# Patient Record
Sex: Female | Born: 1950
Health system: Southern US, Community
[De-identification: ages and names within clinical notes are randomized; demographics above are authoritative.]

## PROBLEM LIST (undated history)

## (undated) DIAGNOSIS — G5600 Carpal tunnel syndrome, unspecified upper limb: Secondary | ICD-10-CM

## (undated) DIAGNOSIS — R1013 Epigastric pain: Secondary | ICD-10-CM

## (undated) DIAGNOSIS — E782 Mixed hyperlipidemia: Secondary | ICD-10-CM

## (undated) DIAGNOSIS — K219 Gastro-esophageal reflux disease without esophagitis: Secondary | ICD-10-CM

## (undated) DIAGNOSIS — E7800 Pure hypercholesterolemia, unspecified: Secondary | ICD-10-CM

## (undated) DIAGNOSIS — I10 Essential (primary) hypertension: Secondary | ICD-10-CM

## (undated) DIAGNOSIS — R000 Tachycardia, unspecified: Secondary | ICD-10-CM

## (undated) DIAGNOSIS — E669 Obesity, unspecified: Secondary | ICD-10-CM

## (undated) DIAGNOSIS — E119 Type 2 diabetes mellitus without complications: Secondary | ICD-10-CM

## (undated) DIAGNOSIS — R1115 Cyclical vomiting syndrome unrelated to migraine: Secondary | ICD-10-CM

## (undated) DIAGNOSIS — E049 Nontoxic goiter, unspecified: Secondary | ICD-10-CM

## (undated) DIAGNOSIS — M8580 Other specified disorders of bone density and structure, unspecified site: Secondary | ICD-10-CM

## (undated) DIAGNOSIS — L02239 Carbuncle of trunk, unspecified: Secondary | ICD-10-CM

## (undated) DIAGNOSIS — E1142 Type 2 diabetes mellitus with diabetic polyneuropathy: Secondary | ICD-10-CM

## (undated) DIAGNOSIS — Z9889 Other specified postprocedural states: Secondary | ICD-10-CM

## (undated) DIAGNOSIS — E1143 Type 2 diabetes mellitus with diabetic autonomic (poly)neuropathy: Secondary | ICD-10-CM

## (undated) DIAGNOSIS — R609 Edema, unspecified: Secondary | ICD-10-CM

## (undated) DIAGNOSIS — T4145XA Adverse effect of unspecified anesthetic, initial encounter: Secondary | ICD-10-CM

## (undated) DIAGNOSIS — T8859XA Other complications of anesthesia, initial encounter: Secondary | ICD-10-CM

## (undated) DIAGNOSIS — E1169 Type 2 diabetes mellitus with other specified complication: Secondary | ICD-10-CM

## (undated) DIAGNOSIS — Z780 Asymptomatic menopausal state: Secondary | ICD-10-CM

## (undated) DIAGNOSIS — R112 Nausea with vomiting, unspecified: Secondary | ICD-10-CM

## (undated) DIAGNOSIS — N189 Chronic kidney disease, unspecified: Secondary | ICD-10-CM

## (undated) DIAGNOSIS — G4733 Obstructive sleep apnea (adult) (pediatric): Principal | ICD-10-CM

## (undated) HISTORY — DX: Type 2 diabetes mellitus with diabetic autonomic (poly)neuropathy: E11.43

## (undated) HISTORY — DX: Carpal tunnel syndrome, unspecified upper limb: G56.00

## (undated) HISTORY — DX: Type 2 diabetes mellitus without complications: E11.9

## (undated) HISTORY — DX: Chronic kidney disease, unspecified: N18.9

## (undated) HISTORY — DX: Obesity, unspecified: E66.9

## (undated) HISTORY — DX: Obstructive sleep apnea (adult) (pediatric): G47.33

## (undated) HISTORY — DX: Cyclical vomiting syndrome unrelated to migraine: R11.15

## (undated) HISTORY — DX: Other specified disorders of bone density and structure, unspecified site: M85.80

## (undated) HISTORY — DX: Carbuncle of trunk, unspecified: L02.239

## (undated) HISTORY — DX: Nontoxic goiter, unspecified: E04.9

## (undated) HISTORY — DX: Essential (primary) hypertension: I10

## (undated) HISTORY — DX: Edema, unspecified: R60.9

## (undated) HISTORY — DX: Pure hypercholesterolemia, unspecified: E78.00

## (undated) HISTORY — DX: Asymptomatic menopausal state: Z78.0

## (undated) HISTORY — DX: Gastro-esophageal reflux disease without esophagitis: K21.9

## (undated) HISTORY — DX: Mixed hyperlipidemia: E78.2

## (undated) HISTORY — DX: Tachycardia, unspecified: R00.0

## (undated) HISTORY — DX: Type 2 diabetes mellitus with diabetic polyneuropathy: E11.42

## (undated) HISTORY — DX: Epigastric pain: R10.13

## (undated) HISTORY — DX: Type 2 diabetes mellitus with other specified complication: E11.69

---
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
## 1986-12-30 HISTORY — PX: DILATION AND CURETTAGE OF UTERUS: SHX78

## 1999-01-02 ENCOUNTER — Other Ambulatory Visit: Admission: RE | Admit: 1999-01-02 | Discharge: 1999-01-02 | Payer: Self-pay | Admitting: Gynecology

## 2000-01-24 ENCOUNTER — Other Ambulatory Visit: Admission: RE | Admit: 2000-01-24 | Discharge: 2000-01-24 | Payer: Self-pay | Admitting: Gynecology

## 2000-01-24 ENCOUNTER — Encounter: Admission: RE | Admit: 2000-01-24 | Discharge: 2000-01-24 | Payer: Self-pay | Admitting: Gynecology

## 2000-01-24 ENCOUNTER — Encounter: Payer: Self-pay | Admitting: Gynecology

## 2001-01-16 ENCOUNTER — Encounter: Payer: Self-pay | Admitting: Internal Medicine

## 2001-01-16 ENCOUNTER — Encounter: Admission: RE | Admit: 2001-01-16 | Discharge: 2001-01-16 | Payer: Self-pay | Admitting: Internal Medicine

## 2001-03-31 ENCOUNTER — Encounter: Payer: Self-pay | Admitting: Internal Medicine

## 2001-03-31 ENCOUNTER — Encounter: Admission: RE | Admit: 2001-03-31 | Discharge: 2001-03-31 | Payer: Self-pay | Admitting: Internal Medicine

## 2001-04-01 ENCOUNTER — Other Ambulatory Visit: Admission: RE | Admit: 2001-04-01 | Discharge: 2001-04-01 | Payer: Self-pay | Admitting: Gynecology

## 2002-04-14 ENCOUNTER — Encounter: Admission: RE | Admit: 2002-04-14 | Discharge: 2002-04-14 | Payer: Self-pay | Admitting: Family Medicine

## 2002-04-14 ENCOUNTER — Encounter: Payer: Self-pay | Admitting: Family Medicine

## 2002-06-22 ENCOUNTER — Other Ambulatory Visit: Admission: RE | Admit: 2002-06-22 | Discharge: 2002-06-22 | Payer: Self-pay | Admitting: Gynecology

## 2003-04-11 ENCOUNTER — Encounter: Admission: RE | Admit: 2003-04-11 | Discharge: 2003-07-10 | Payer: Self-pay | Admitting: Internal Medicine

## 2003-05-20 ENCOUNTER — Encounter: Payer: Self-pay | Admitting: Gastroenterology

## 2003-07-07 ENCOUNTER — Encounter: Payer: Self-pay | Admitting: Internal Medicine

## 2003-07-07 ENCOUNTER — Encounter: Admission: RE | Admit: 2003-07-07 | Discharge: 2003-07-07 | Payer: Self-pay | Admitting: Internal Medicine

## 2003-07-14 ENCOUNTER — Other Ambulatory Visit: Admission: RE | Admit: 2003-07-14 | Discharge: 2003-07-14 | Payer: Self-pay | Admitting: Gynecology

## 2004-07-31 ENCOUNTER — Encounter: Admission: RE | Admit: 2004-07-31 | Discharge: 2004-07-31 | Payer: Self-pay | Admitting: Internal Medicine

## 2004-08-07 ENCOUNTER — Other Ambulatory Visit: Admission: RE | Admit: 2004-08-07 | Discharge: 2004-08-07 | Payer: Self-pay | Admitting: Gynecology

## 2005-10-04 ENCOUNTER — Encounter: Admission: RE | Admit: 2005-10-04 | Discharge: 2005-10-04 | Payer: Self-pay | Admitting: Internal Medicine

## 2005-11-25 ENCOUNTER — Other Ambulatory Visit: Admission: RE | Admit: 2005-11-25 | Discharge: 2005-11-25 | Payer: Self-pay | Admitting: Gynecology

## 2005-12-19 ENCOUNTER — Ambulatory Visit: Payer: Self-pay | Admitting: "Endocrinology

## 2006-02-11 ENCOUNTER — Ambulatory Visit: Payer: Self-pay | Admitting: "Endocrinology

## 2006-02-26 ENCOUNTER — Encounter: Admission: RE | Admit: 2006-02-26 | Discharge: 2006-05-27 | Payer: Self-pay | Admitting: "Endocrinology

## 2006-04-09 ENCOUNTER — Ambulatory Visit: Payer: Self-pay | Admitting: "Endocrinology

## 2006-07-08 ENCOUNTER — Encounter (INDEPENDENT_AMBULATORY_CARE_PROVIDER_SITE_OTHER): Payer: Self-pay | Admitting: *Deleted

## 2006-07-08 ENCOUNTER — Ambulatory Visit (HOSPITAL_BASED_OUTPATIENT_CLINIC_OR_DEPARTMENT_OTHER): Admission: RE | Admit: 2006-07-08 | Discharge: 2006-07-08 | Payer: Self-pay | Admitting: Urology

## 2006-07-08 ENCOUNTER — Encounter (INDEPENDENT_AMBULATORY_CARE_PROVIDER_SITE_OTHER): Payer: Self-pay | Admitting: Specialist

## 2006-07-28 ENCOUNTER — Ambulatory Visit (HOSPITAL_COMMUNITY): Admission: RE | Admit: 2006-07-28 | Discharge: 2006-07-28 | Payer: Self-pay | Admitting: Urology

## 2006-07-28 IMAGING — NM NM RENAL IMAGING FLOW W/ PHARM
2 series · 12 of 12 positions shown · non-contrast
Comparison: None.

CLINICAL DATA: Evaluate for hydronephrosis ? ureteral obstruction.
NUCLEAR MEDICINE RENAL SCAN WITH DIURETIC ADMINISTRATION ? [DATE]:
TECHNIQUE: Radionuclide angiographic and sequential renal images were obtained after intravenous injection of radiopharmaceutical.  Imaging was continued during slow intravenous injection of Lasix approximately 15 minutes after the start of the examination. 
Radiopharmaceutical:  15.0 mCi [DK] MAG3.  39.8 mg intravenous Lasix administered 20 minutes into the study.

[Series 1: re renal qualitative · 9.51mm/px · 6 of 130 frames shown (1 of 2)]
[frame 11/130]
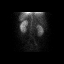
[frame 33/130]
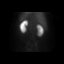
[frame 55/130]
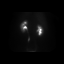
[frame 76/130]
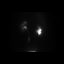
[frame 98/130]
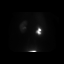
[frame 120/130]
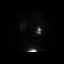

[Series 1: re renal qualitative · 9.51mm/px · 6 of 130 frames shown (2 of 2)]
[frame 11/130]
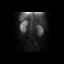
[frame 33/130]
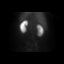
[frame 55/130]
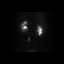
[frame 76/130]
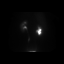
[frame 98/130]
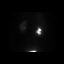
[frame 120/130]
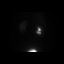

[12 of 12 positions shown; findings below may reference images not displayed]

FINDINGS: The dynamic images demonstrate a good aortic bolus and good perfusion of both kidneys.  Renal mass differential is 61% left and 39% right.  
The renogram images demonstrate prompt washout of activity from the left collecting system.  There is progressive activity in the right renal pelvis with some washout following Lasix.  The renogram curves demonstrate a normal downsloping curve on the left.  The right curve is downsloping, but has a flatter slope with a T1/2 of 33 minutes.  There is progressive bladder activity.
IMPRESSION: 1.  The study suggests low-grade obstruction at the right ureteropelvic junction with a decreased renogram slope but washout following Lasix.  Right renal mass is relatively decreased with respect to the left implying some chronicity of this UPJ stenosis.
2.  The left kidney appears normal.

## 2006-09-08 ENCOUNTER — Ambulatory Visit: Payer: Self-pay | Admitting: "Endocrinology

## 2006-10-03 ENCOUNTER — Encounter: Admission: RE | Admit: 2006-10-03 | Discharge: 2006-10-03 | Payer: Self-pay | Admitting: Internal Medicine

## 2006-10-03 IMAGING — CR DG CHEST 2V
2 series · 2 of 2 positions shown · non-contrast
Comparison: None.

CLINICAL DATA: Hypertension. Preoperative clearance for surgery on [DATE].

[view not recorded (1 of 2)]
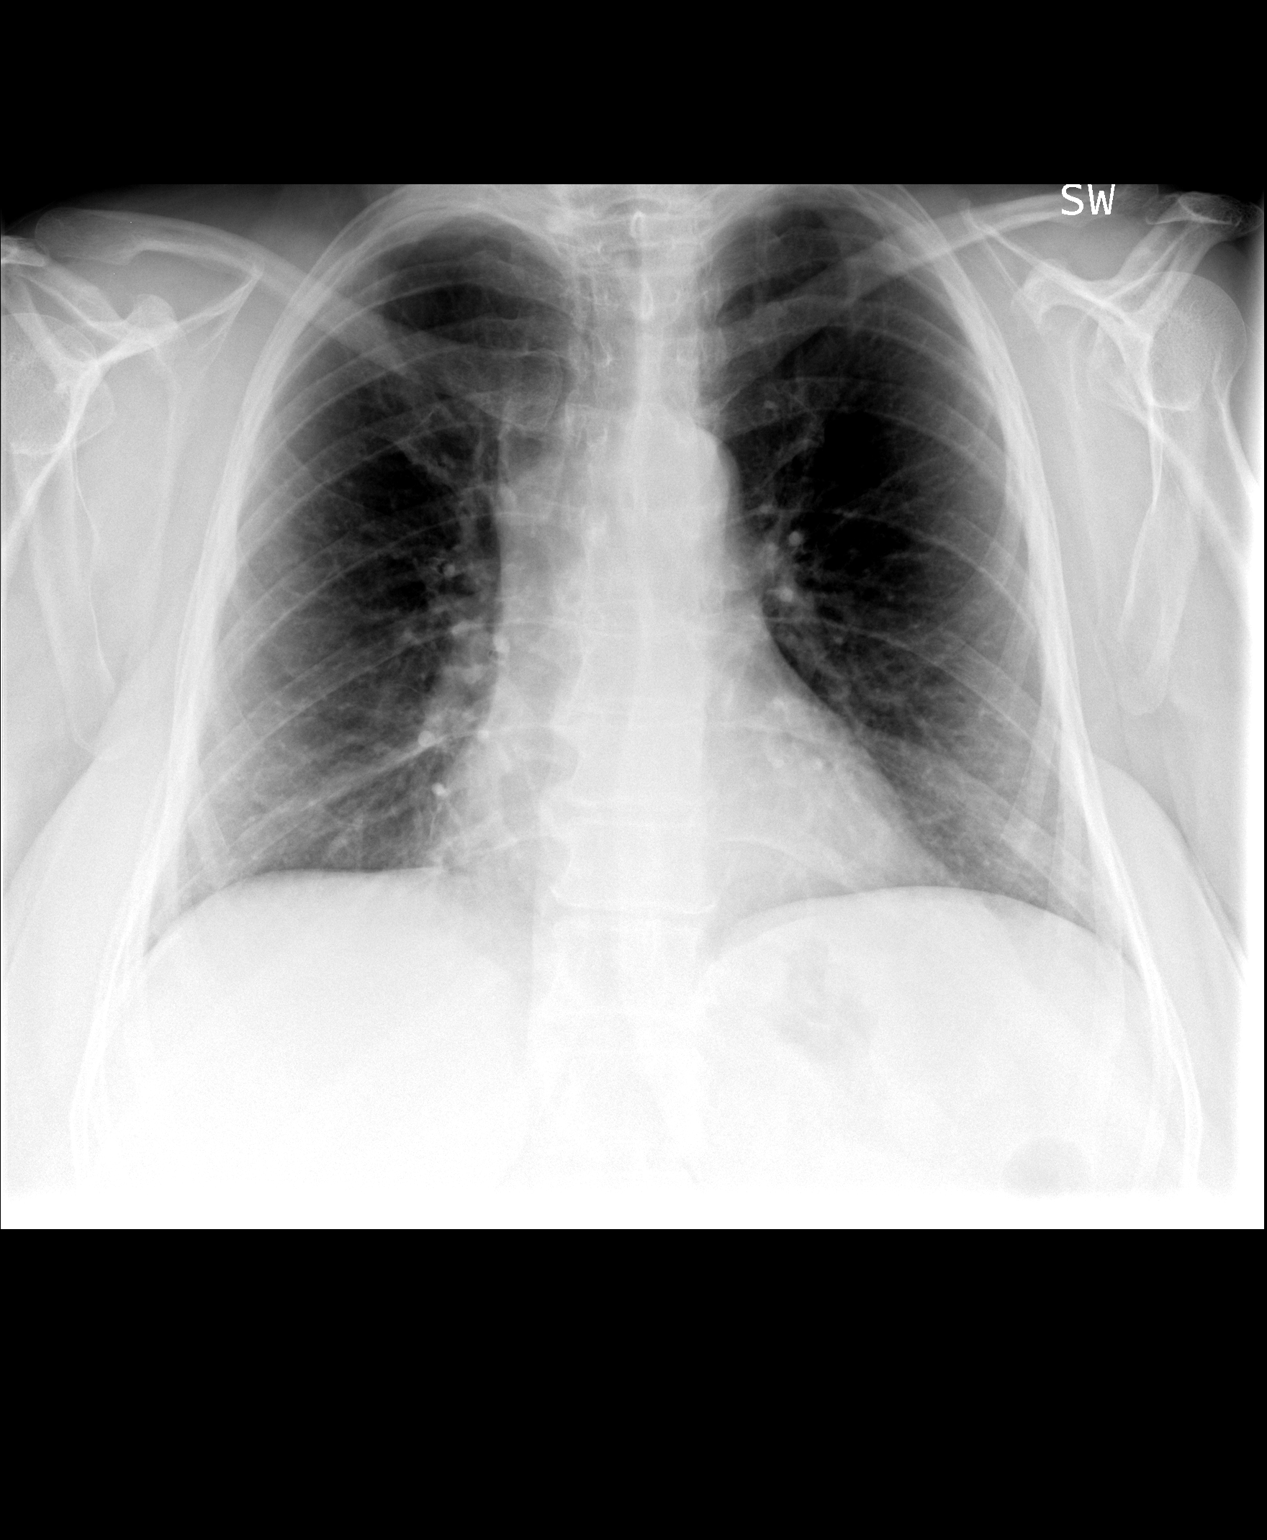

[view not recorded (2 of 2)]
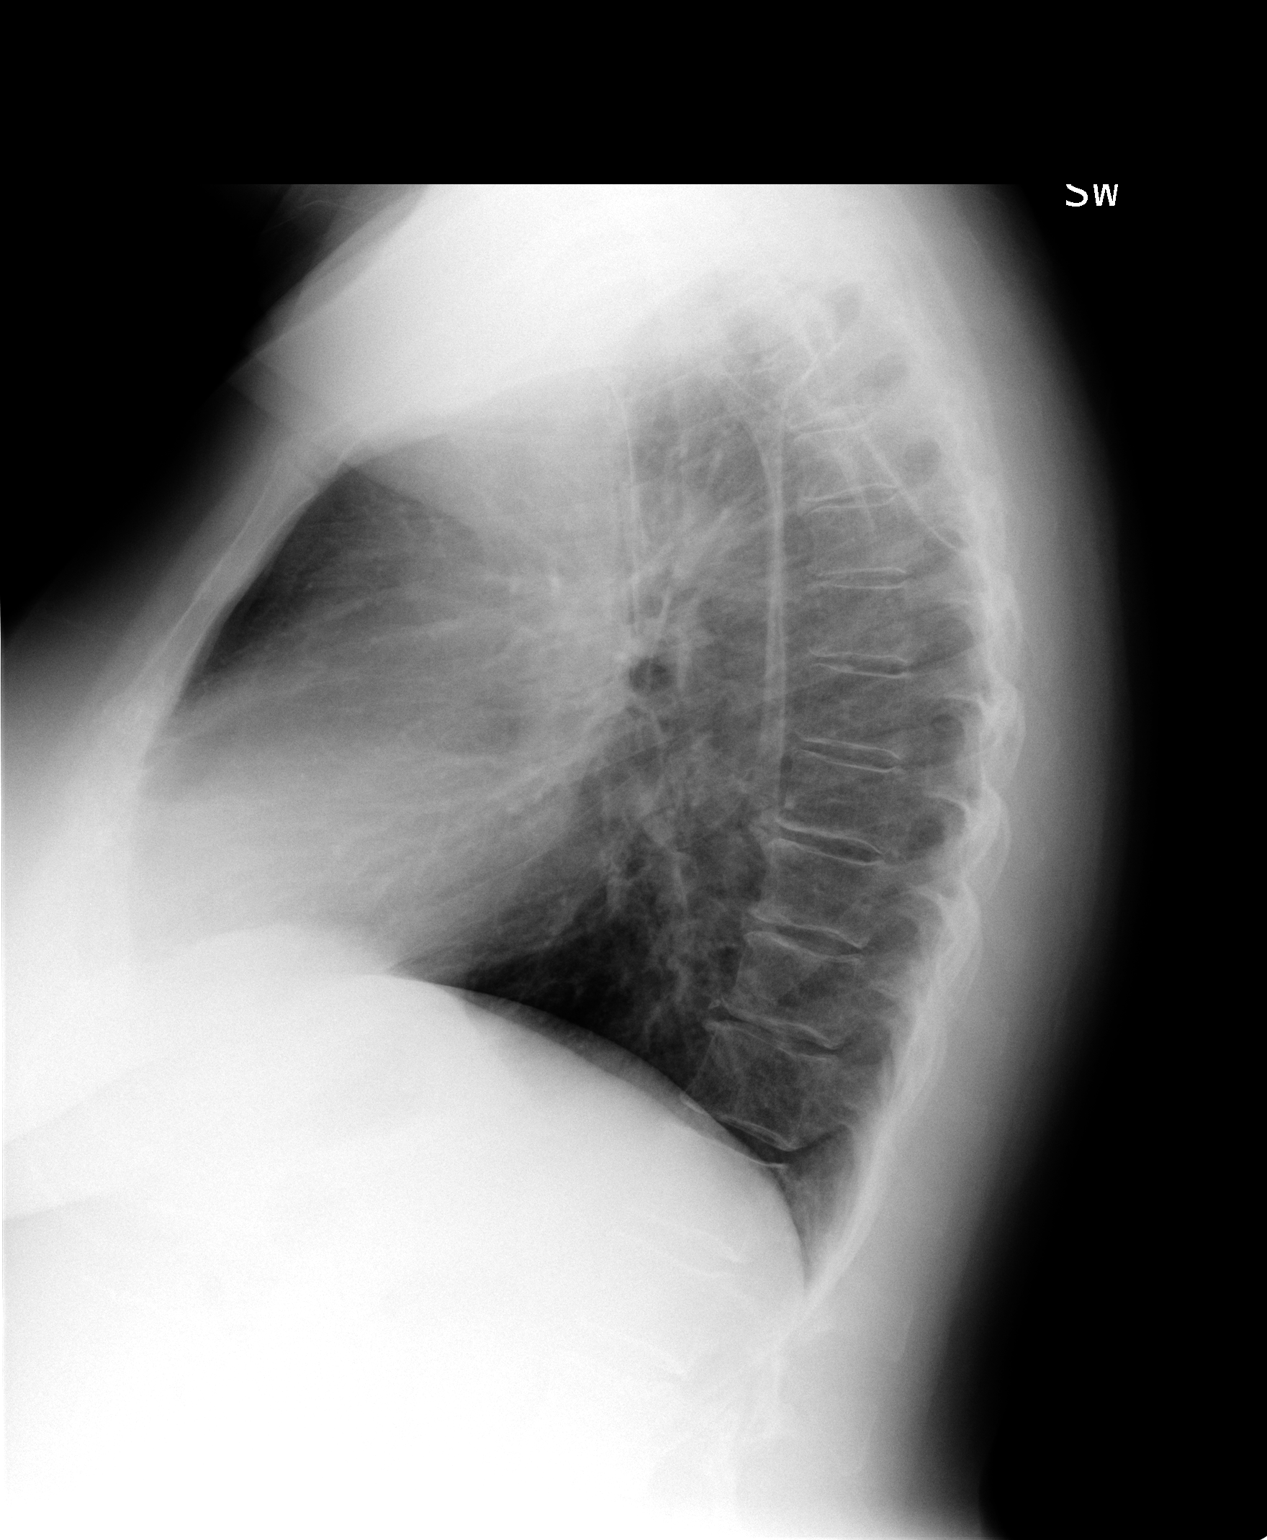

[2 of 2 positions shown; findings below may reference images not displayed]

CHEST - 2 VIEW:

The lungs are clear without focal infiltrate, edema or pleural effusion. Heart
size is at upper limits of normal. Bony structures of the visualized thorax are
intact.
IMPRESSION: No acute cardiopulmonary process

## 2006-10-10 IMAGING — CR DG ABDOMEN 1V
1 series · 1 of 1 positions shown · non-contrast
Comparison: none

CLINICAL DATA: Stent placement

[view not recorded]
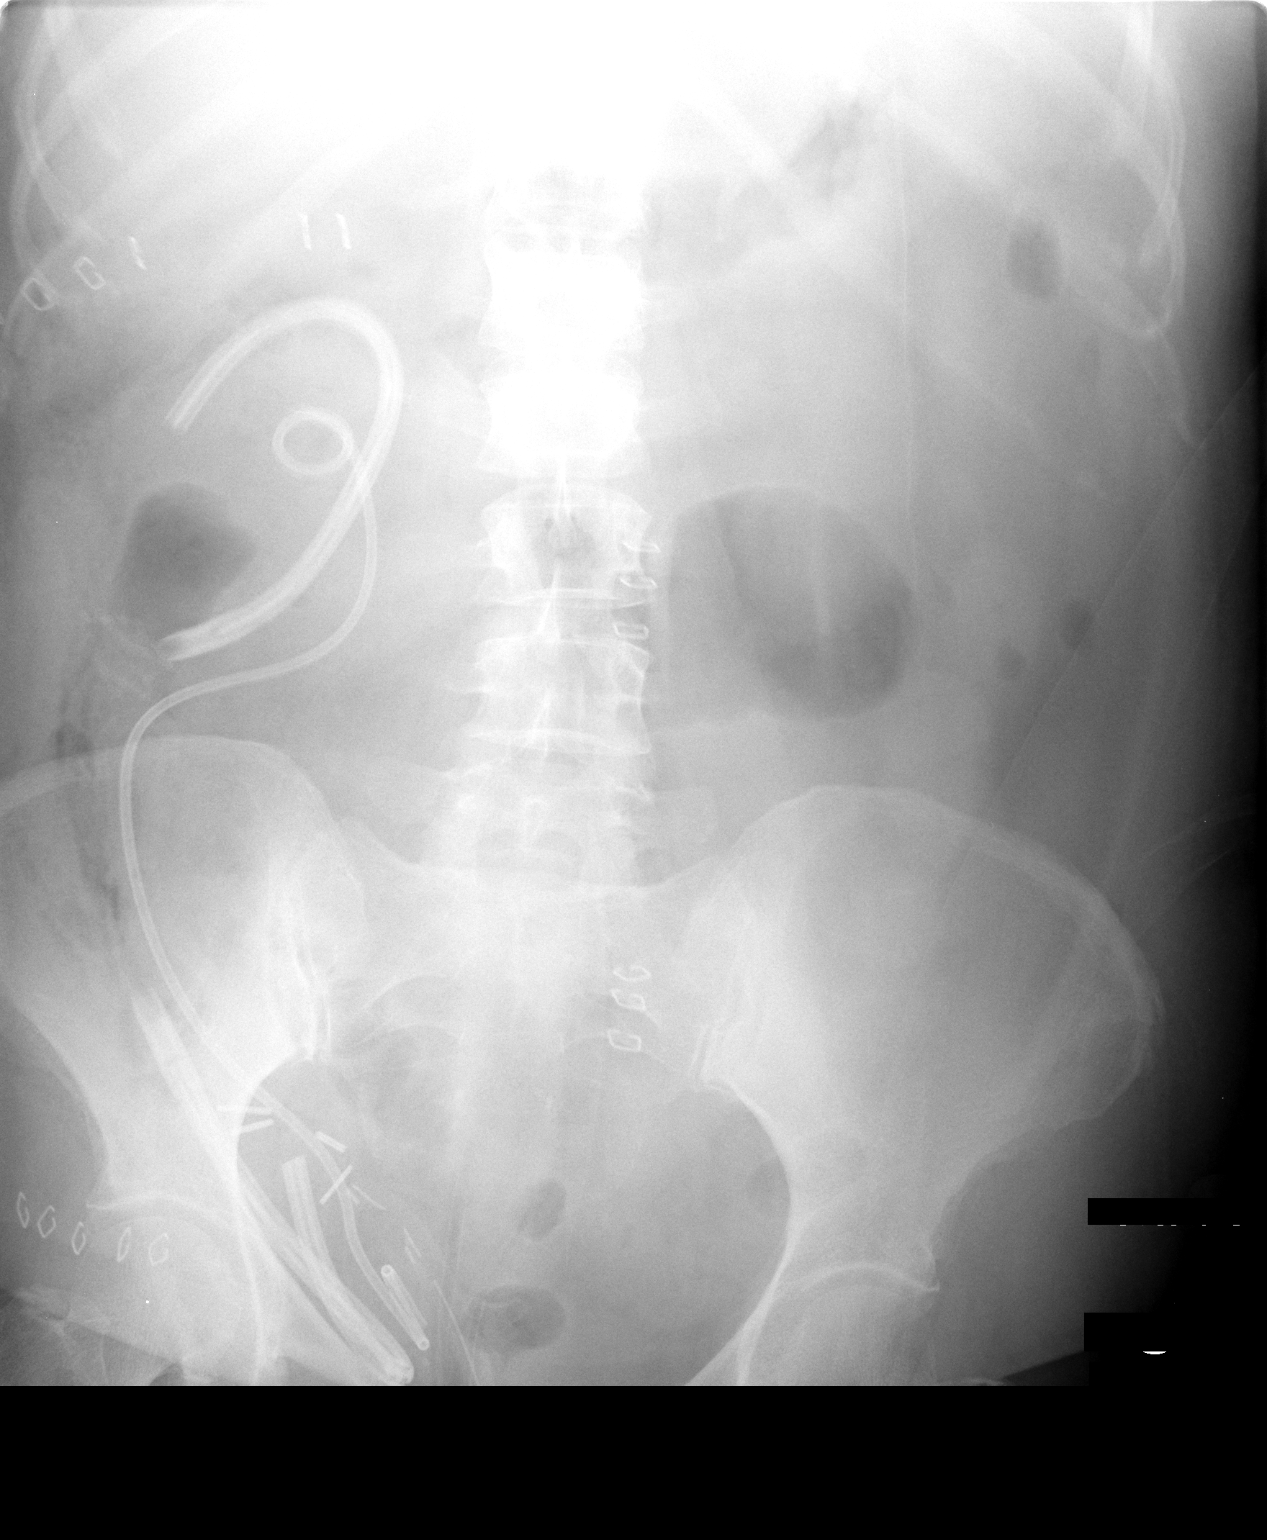

[1 of 1 positions shown; findings below may reference images not displayed]

Portable abdomen at [5T]:

A double J ureteral stent is noted on the right, its mid portion projecting far
lateral to the typical course of the right ureter. Surgical drains project over
the right kidney and in the right pelvis. Midline and right lateral skin
staples. Normal bowel gas pattern. Vascular clips in the right pelvis.
IMPRESSION: 1. Nonobstructed bowel gas pattern.
2. Right ureteral stent with discussion as above.

## 2006-10-17 ENCOUNTER — Encounter: Admission: RE | Admit: 2006-10-17 | Discharge: 2006-10-17 | Payer: Self-pay | Admitting: Internal Medicine

## 2006-10-17 IMAGING — MG MM SCREEN MAMMOGRAM BILATERAL
4 series · 4 of 4 positions shown · non-contrast
Comparison: none

DG SCREEN MAMMOGRAM BILATERAL
Bilateral CC and MLO view(s) were taken.

DIGITAL SCREENING MAMMOGRAM WITH CAD:
There is a fibroglandular pattern.  No masses or malignant type calcifications are identified.  
Compared with prior studies.

[R CC]
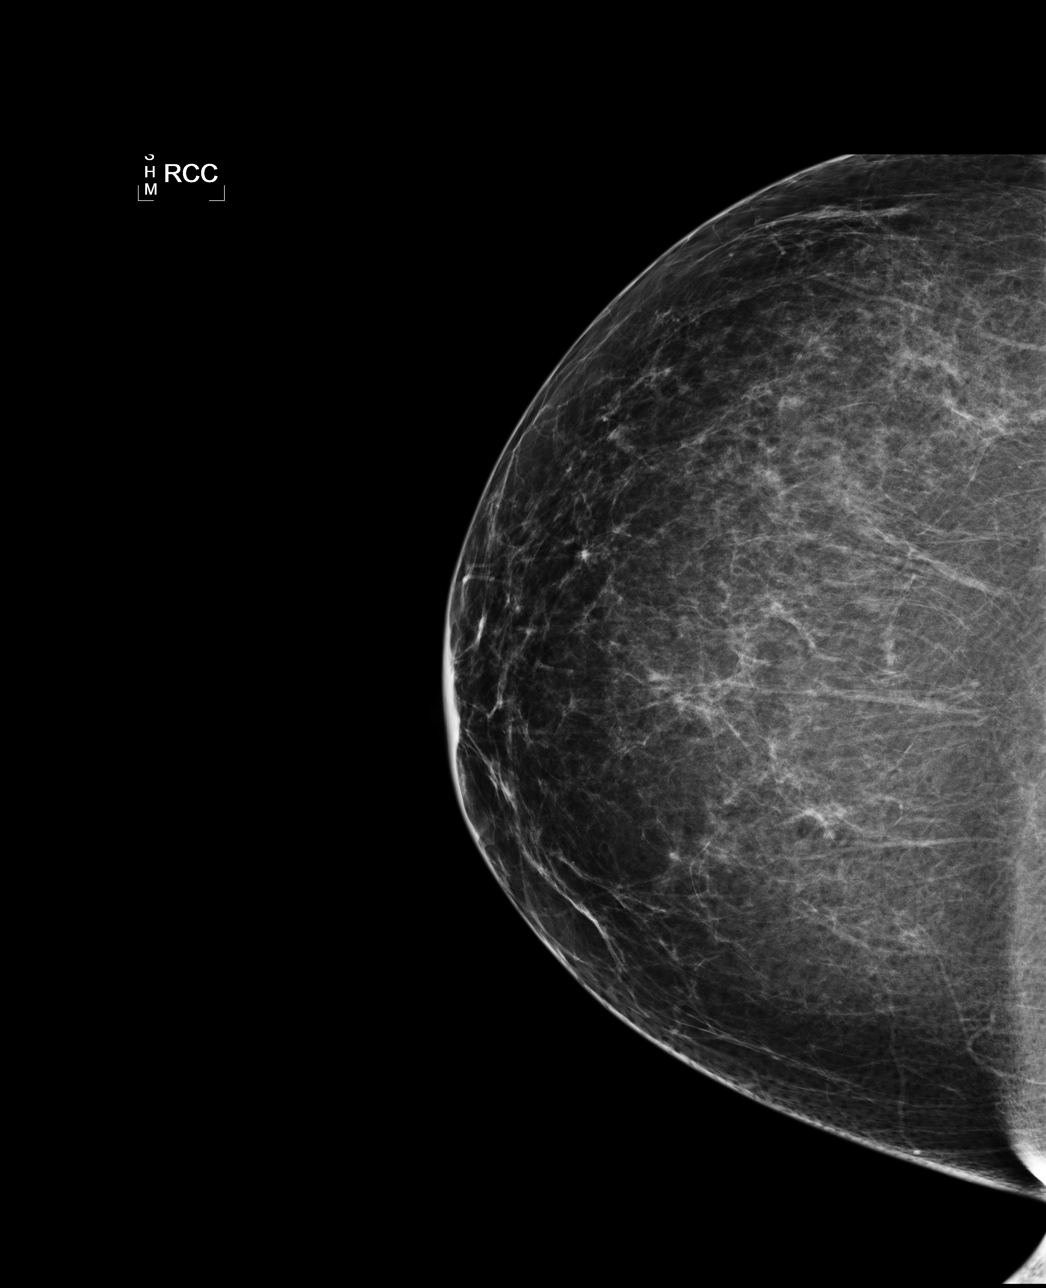

[L CC]
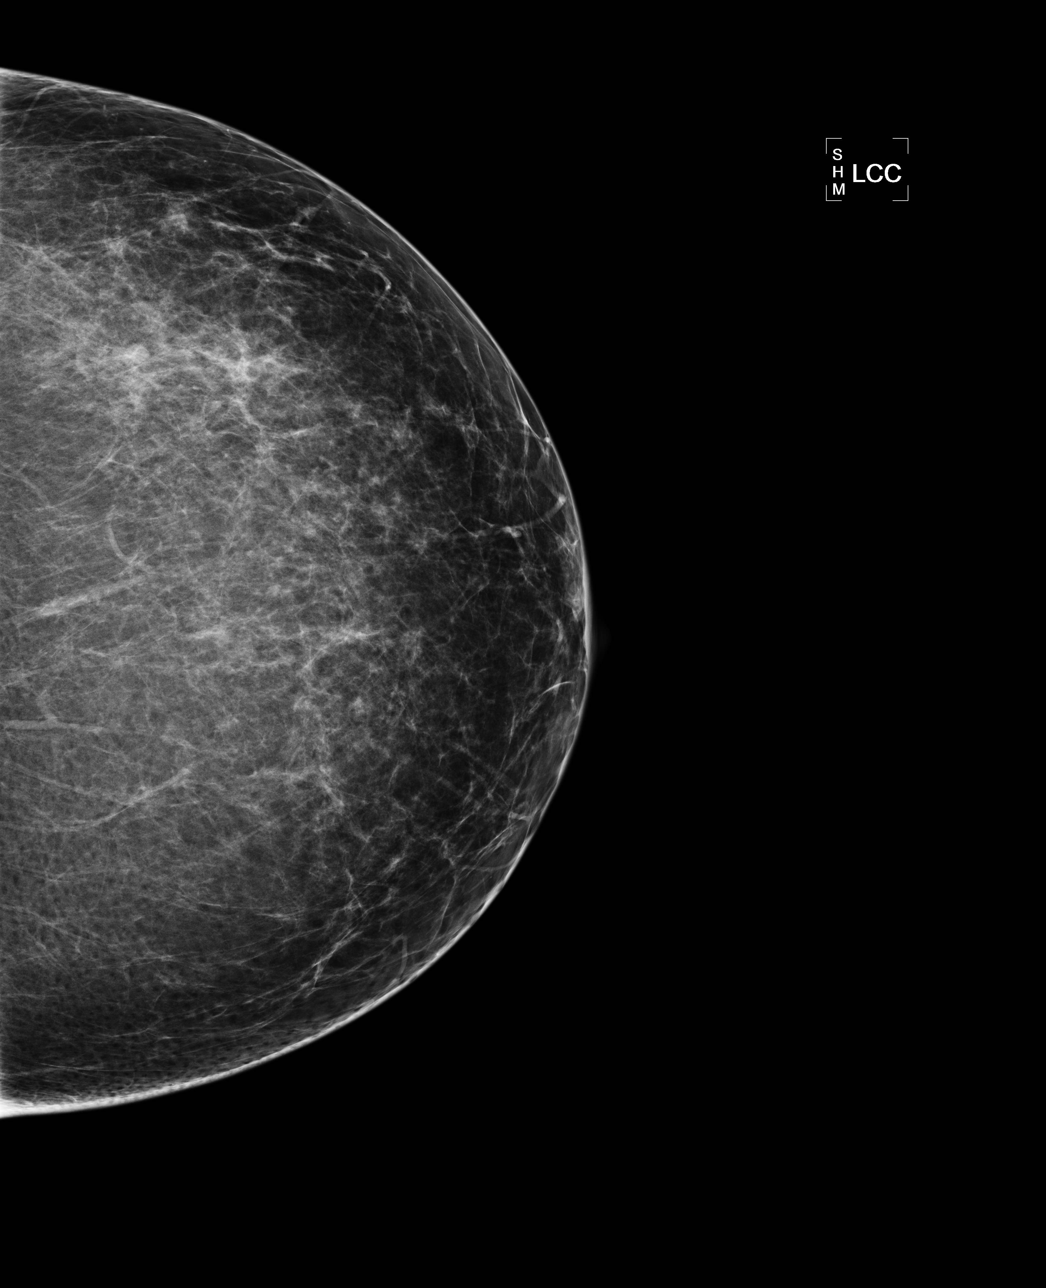

[L MLO]
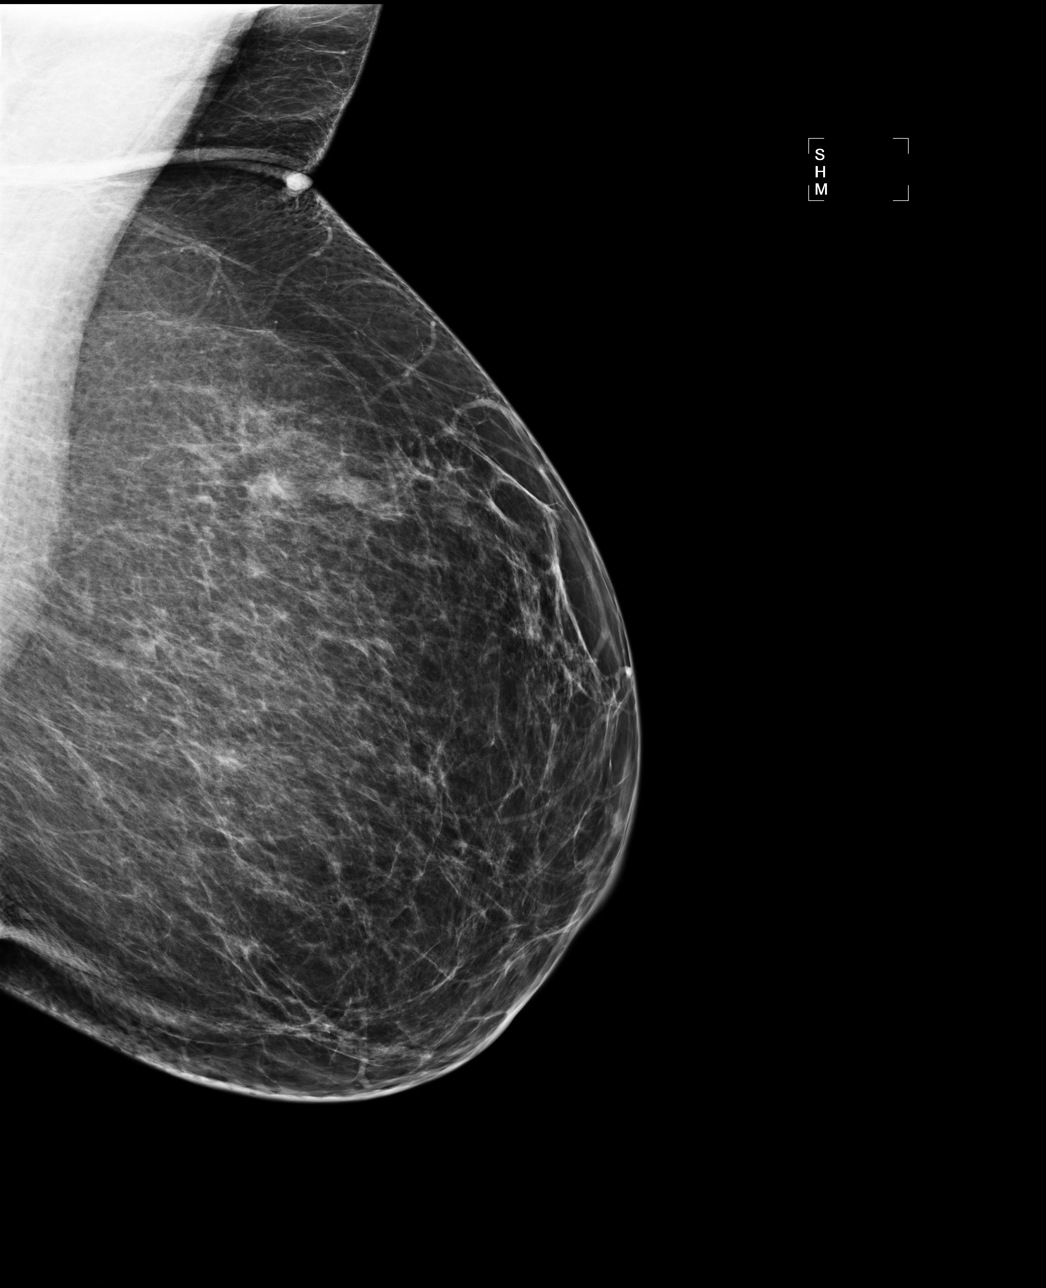

[R MLO]
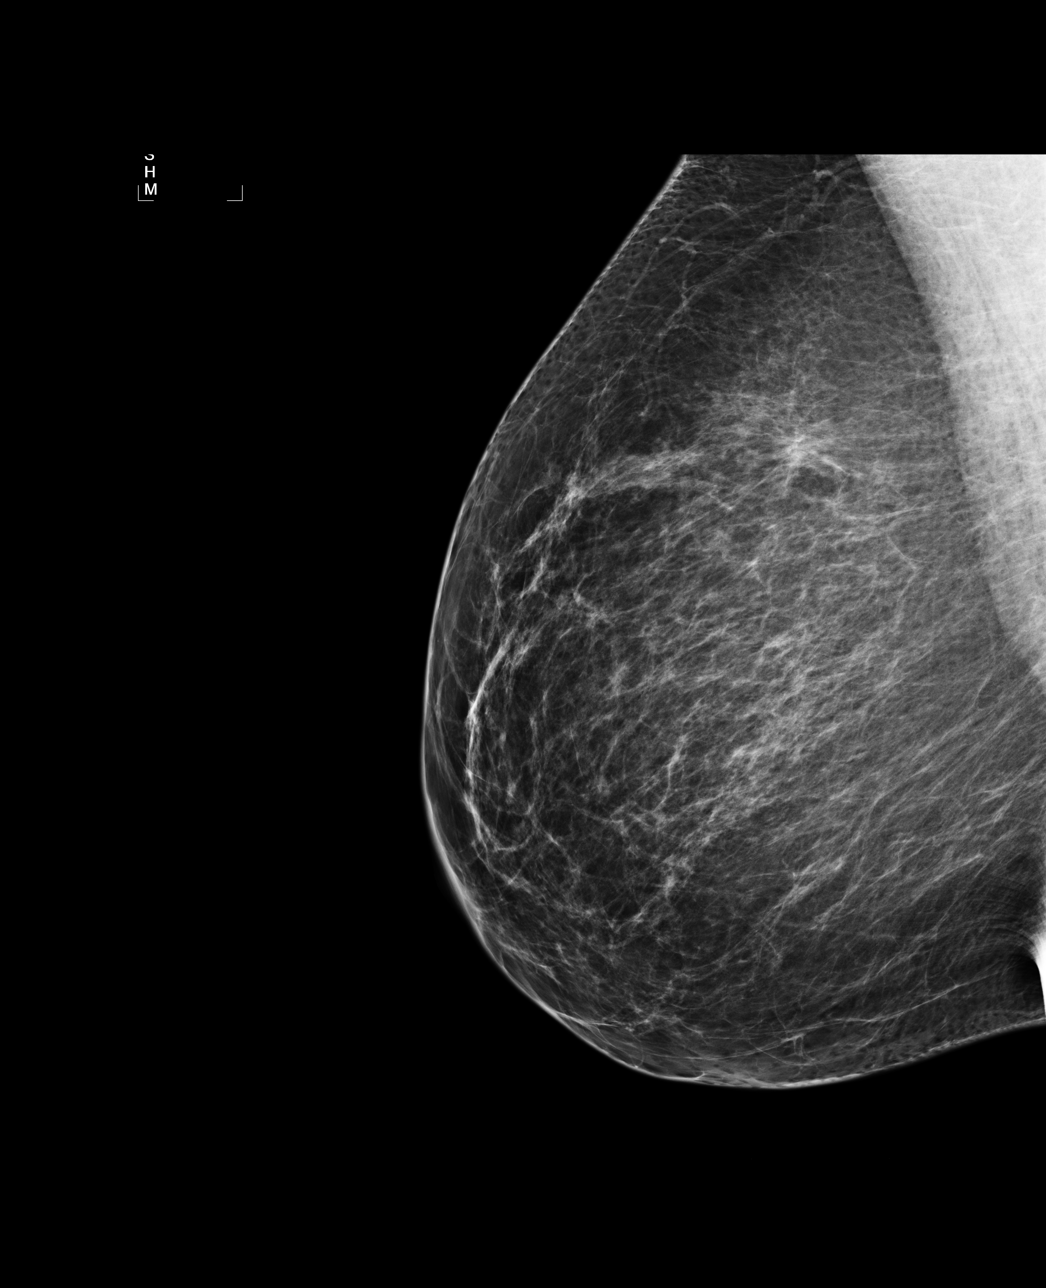

[4 of 4 positions shown; findings below may reference images not displayed]

IMPRESSION: No specific mammographic evidence of malignancy.  Next screening mammogram is recommended in one 
year.

ASSESSMENT: Negative - BI-RADS 1

Screening mammogram in 1 year.
ANALYZED BY COMPUTER AIDED DETECTION. , THIS PROCEDURE WAS A DIGITAL MAMMOGRAM.

## 2006-10-22 ENCOUNTER — Inpatient Hospital Stay (HOSPITAL_COMMUNITY): Admission: RE | Admit: 2006-10-22 | Discharge: 2006-10-31 | Payer: Self-pay | Admitting: Urology

## 2006-10-22 ENCOUNTER — Encounter (INDEPENDENT_AMBULATORY_CARE_PROVIDER_SITE_OTHER): Payer: Self-pay | Admitting: Specialist

## 2006-10-24 IMAGING — CR DG CHEST 1V PORT
1 series · 1 of 1 positions shown · non-contrast
Comparison: [DATE].

CLINICAL DATA: Right UPJ obstruction.  Hematuria, nausea and headache.  
 PORTABLE CHEST - 1 VIEW:

[view not recorded]
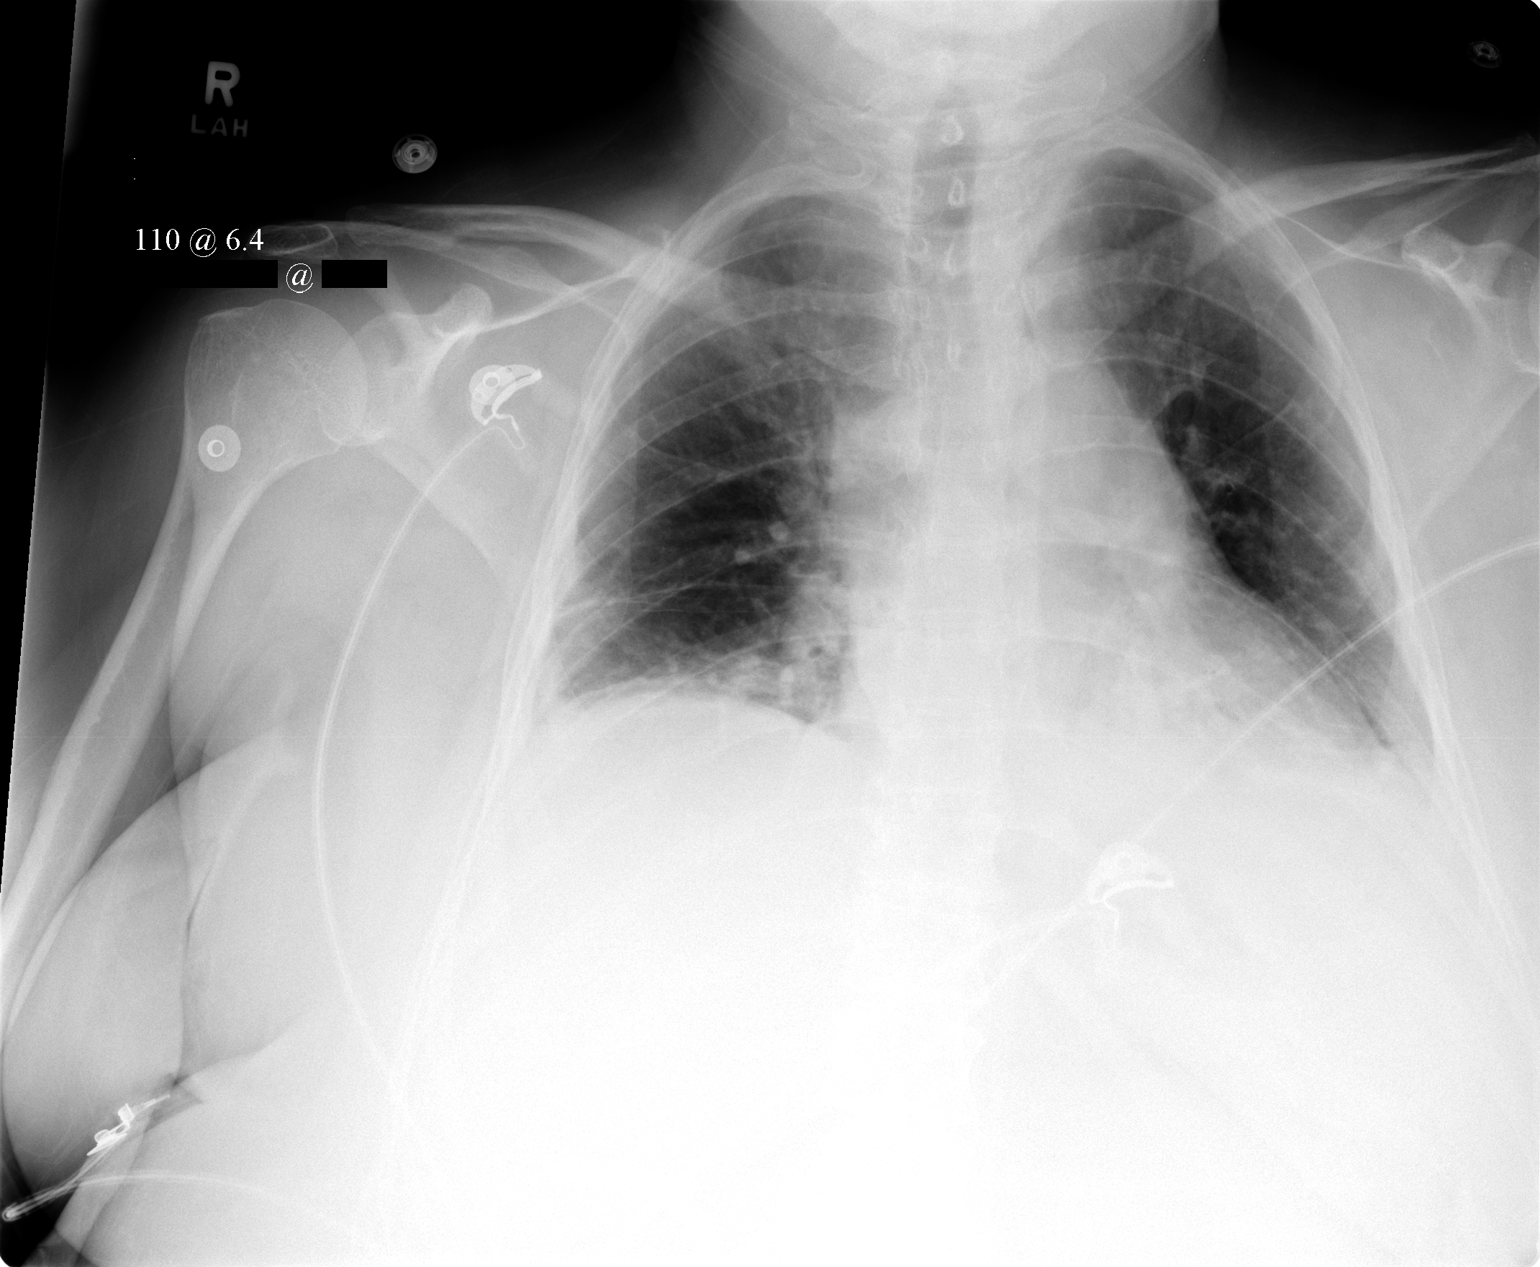

[1 of 1 positions shown; findings below may reference images not displayed]

FINDINGS: Heart size is mildly enlarged.  There are no effusions or edema.  There is atelectasis at both lung bases, left greater than right.
IMPRESSION: Bibasilar atelectasis, left greater than right.

## 2006-10-24 IMAGING — CR DG ABD PORTABLE 1V
1 series · 1 of 1 positions shown · non-contrast
Comparison: [DATE].

CLINICAL DATA: Right UPJ obstruction and hematuria.  Nausea.  
 PORTABLE ABDOMEN ? 1 VIEW:

[view not recorded]
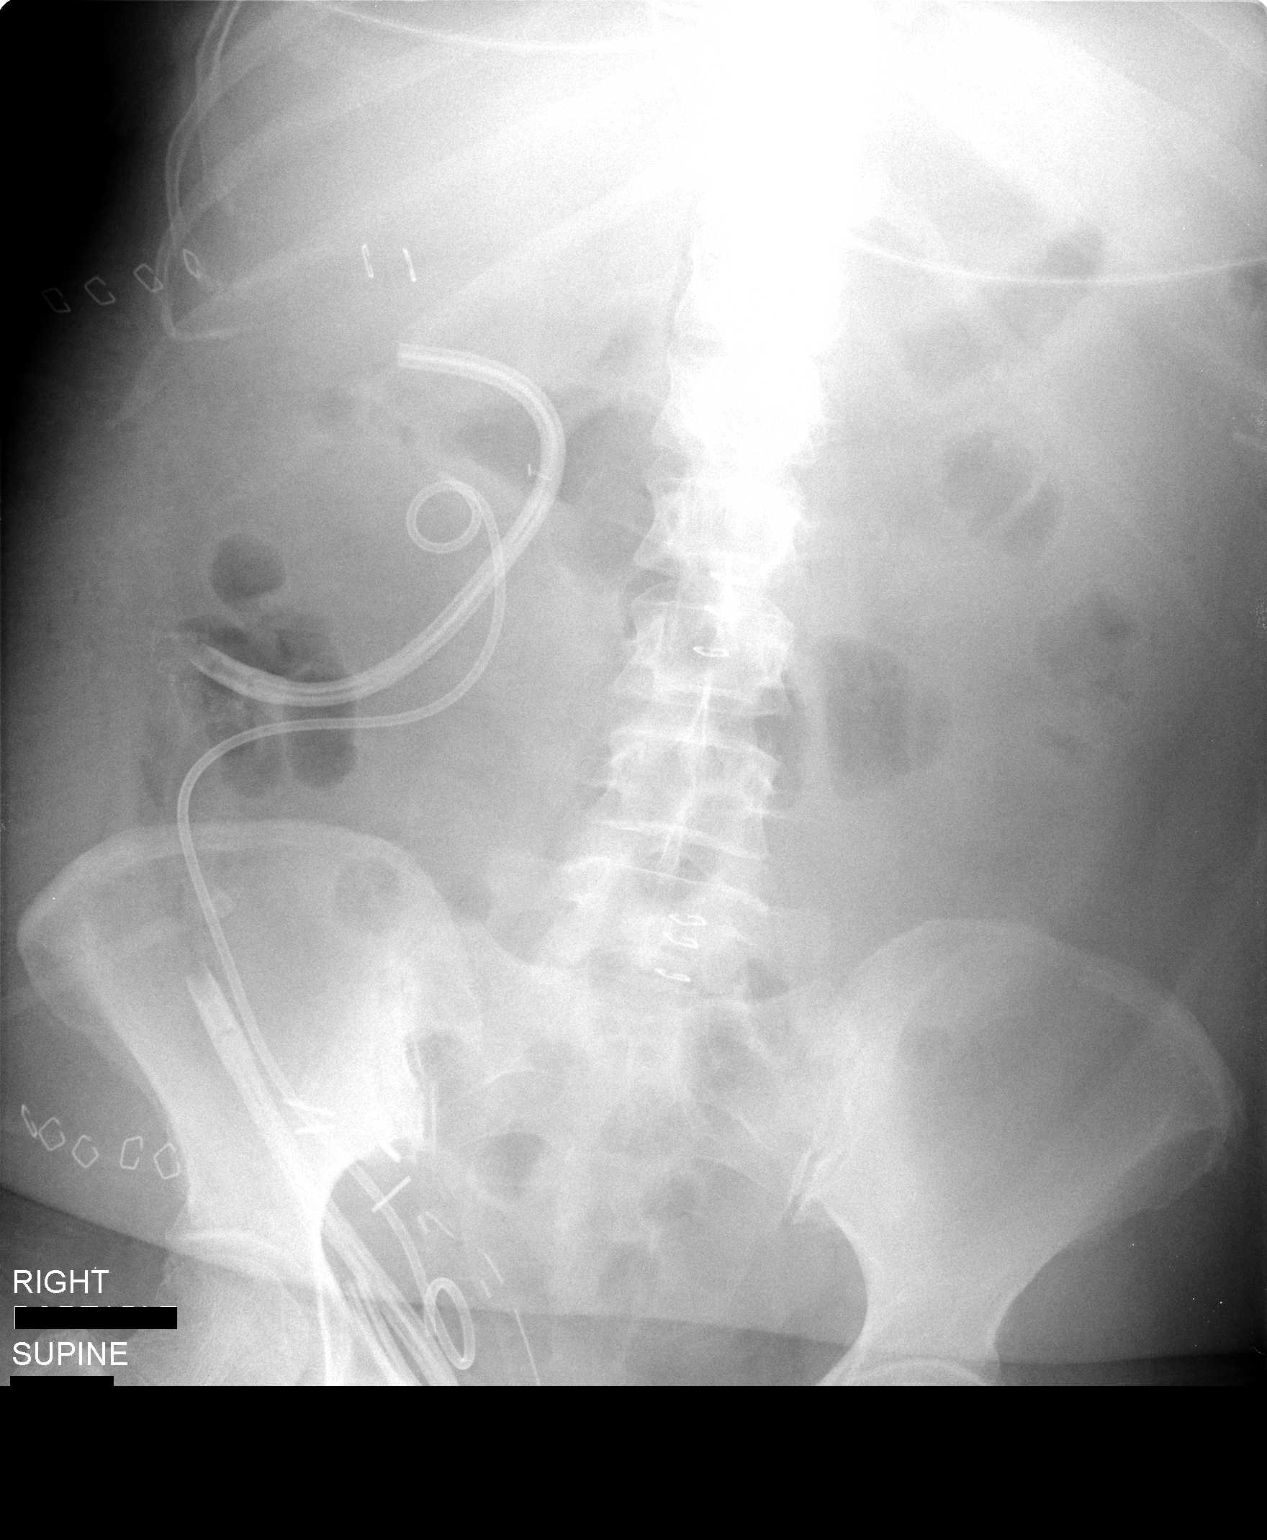

[1 of 1 positions shown; findings below may reference images not displayed]

FINDINGS: There is a right Double-J ureteral stent noted. It mid portion is projecting far lateral to the typical course of the right ureter similar to prior exam.  Surgical drains project over the right kidney and in the right pelvis, midline and right lateral skin staples.  Bowel gas pattern is normal.  Vascular clips are noted in the right pelvis.
IMPRESSION: Nonobstructive bowel gas pattern.  Right ureteral stent with discussion as above.

## 2006-10-27 IMAGING — CT CT ABDOMEN WO/W CM
2 of 9 series · 12 of 46 positions shown, 14 images · IV contrast (omnipaque)
Comparison: none

CLINICAL DATA: The patient is one week status post repair of a right ureteropelvic obstruction.  At that time a loop of ileum had to be utilized to repair the obstructed collecting system.  A mobilized ileal ureter is now present spanning from the UPJ to the bladder.  A Double-J ureteral stent was placed during the procedure.  Surgical drains have also been in place with the superior surgical drain located at the level of the ureteropelvic junction.  This has demonstrated persistent high volume of urinary drainage.  CT is now performed to evaluate for urine leak.  The patient is also scheduled for a percutaneous nephrostomy procedure on [DATE].
1.  ABDOMEN CT WITHOUT AND WITH CONTRAST:
2.  PELVIS CT WITHOUT AND WITH CONTRAST:
TECHNIQUE: Multidetector CT imaging of the abdomen and pelvis was performed following the standard protocol both before and during bolus administration of intravenous contrast.
Contrast:  125 cc Omnipaque 300 IV.  Oral contrast was also administered prior to the procedure.
ABDOMEN CT WITHOUT AND WITH CONTRAST ? [DATE]: 
Initial unenhanced imaging was performed through the abdomen.  This was followed by a contrast enhanced study with standard venous delay as well as further delayed images to evaluate urinary excretion into the collecting systems of the kidneys.

[Series 2: abd/pel w/o 5.0 b40s · axial · non-contrast · 0.86mm/px · z∈[-430,-44]mm · 9 of 97 slices shown, 11 images]
[im 10/97  soft-tissue]
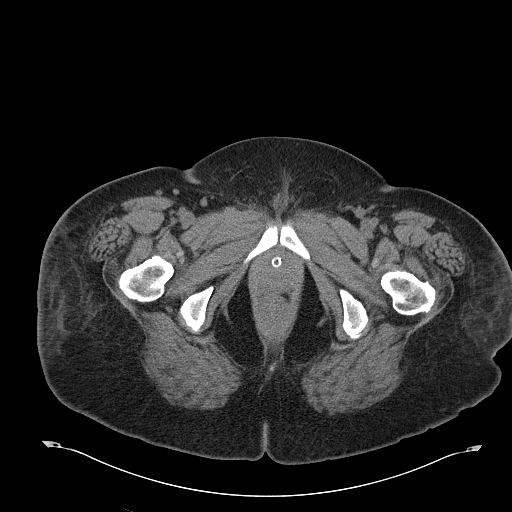
[im 10/97  bone]
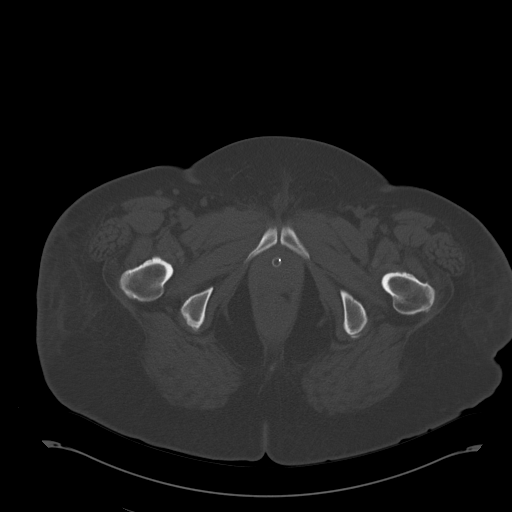
[im 20/97  soft-tissue]
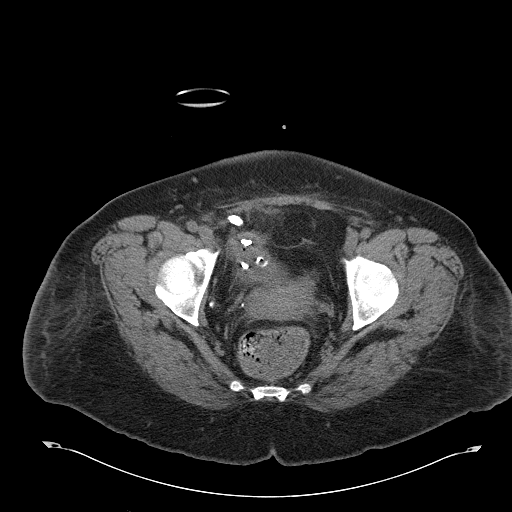
[im 29/97  soft-tissue]
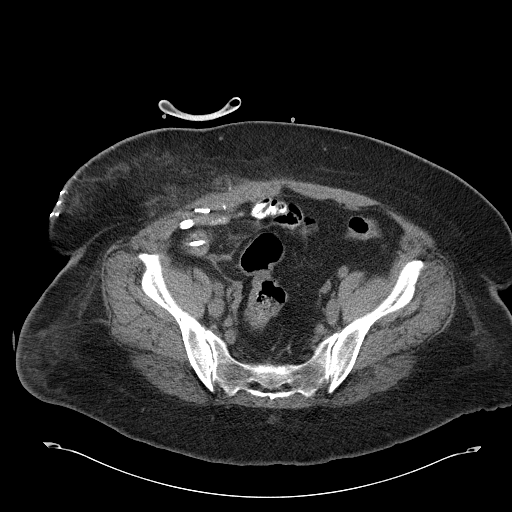
[im 39/97  soft-tissue]
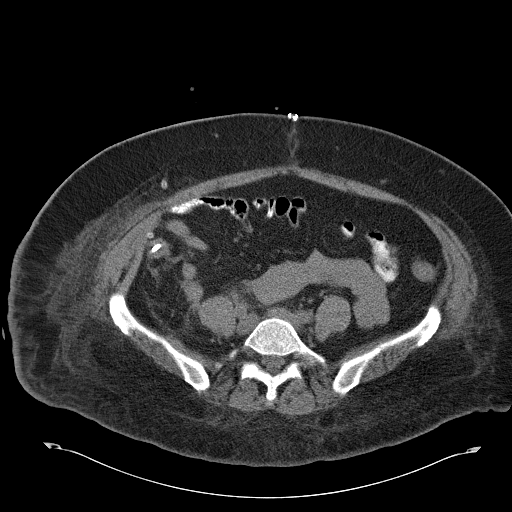
[im 49/97  soft-tissue]
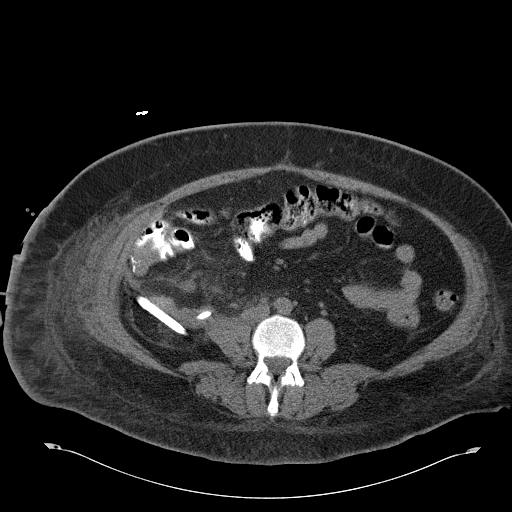
[im 58/97  soft-tissue]
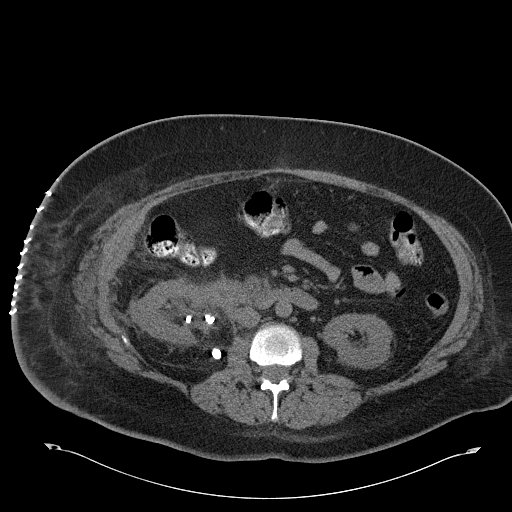
[im 68/97  soft-tissue]
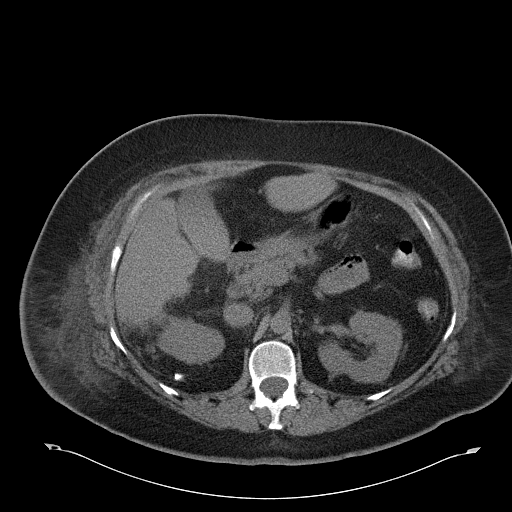
[im 77/97  soft-tissue]
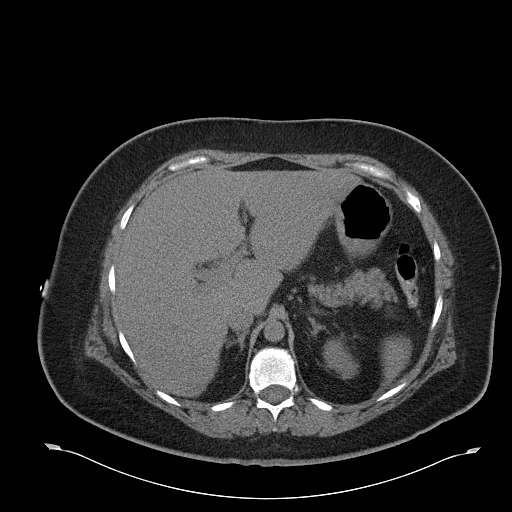
[im 87/97  soft-tissue]
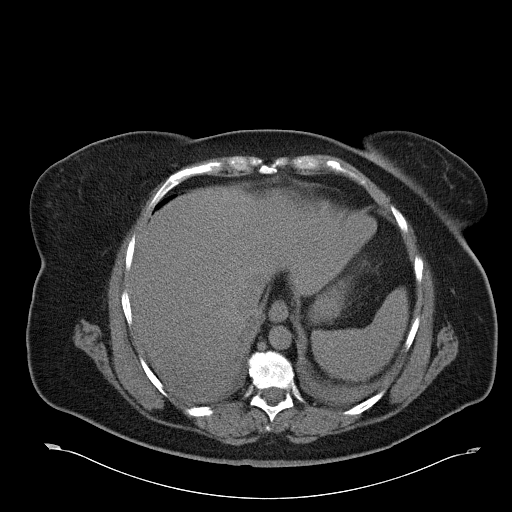
[im 87/97  bone]
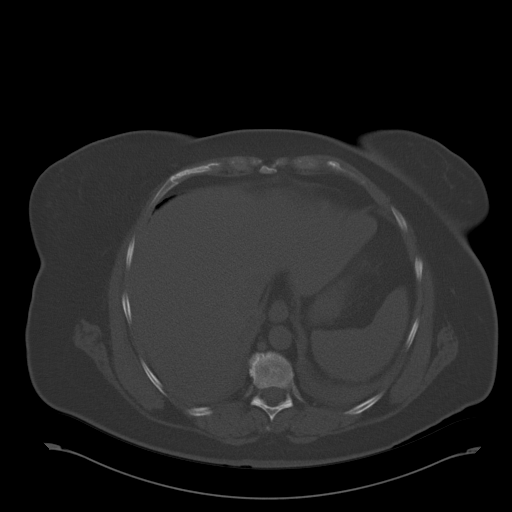

[Series 7: mpr coronal a/p · coronal · 0.99mm/px · 3 of 93 slices shown]
[im 24/93  soft-tissue]
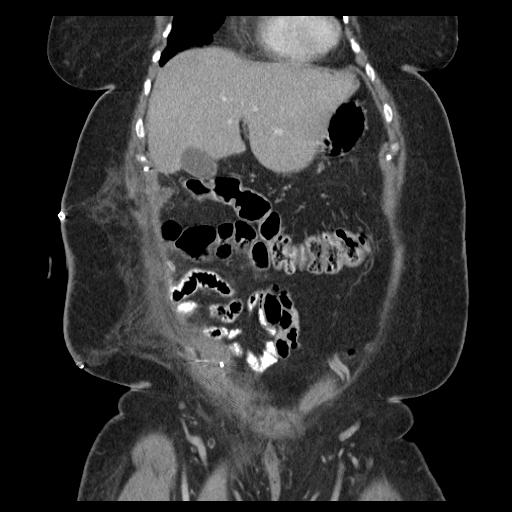
[im 47/93  soft-tissue]
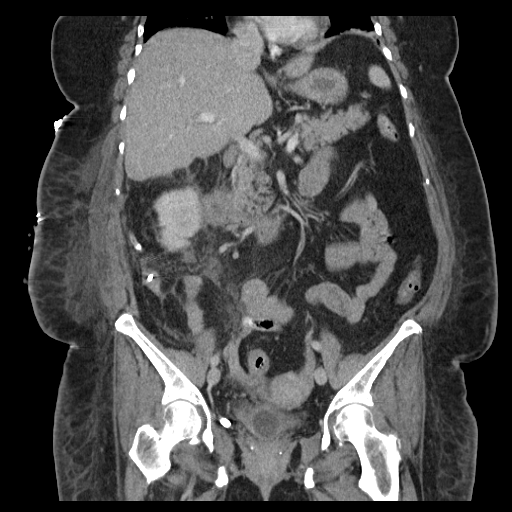
[im 70/93  soft-tissue]
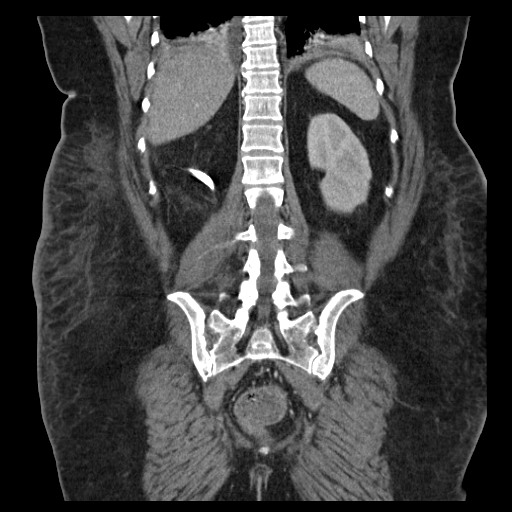

[12 of 46 positions shown; findings below may reference images not displayed]

FINDINGS: Unenhanced study demonstrates an indwelling ureteral stent with the proximal portion in the renal pelvis.  A surgical drain is also present in the retroperitoneum entering laterally and extending posterior to the kidney.  Two clips are also present at the level of the repaired ureteropelvic junction.  
After initial administration of contrast material venous phase imaging shows symmetric perfusion of both kidneys with no infarct present.  There is no initial vascular extravasation of contrast material at the site of right kidney surgery.
Further delayed imaging to evaluate excretion of contrast material demonstrates a definite urinary leak with leakage of contrast seen just outside of the renal pelvis extending medially and wrapping around to the posterior aspect of the pelvis into the position of the surgical drain.  Urine clearly lies outside of the collecting system and would account for the urine output now noted via the surgical drain.  The collecting system itself it decompressed as would be expected.  Excretion of contrast material on the left is within normal limits.
There is no evidence of a focal urinoma at the site of contrast extravasation.  No abscess is identified.  The rest of the abdomen is unremarkable.  The visualized lung bases show small bilateral pleural effusions and bibasilar atelectasis.
IMPRESSION: Positive urine leak on delayed [[facility]OSPITAL] the level of the repaired ureteropelvic junction with contrast extravasation wrapping around the renal pelvis to the point of the posterior right-sided retroperitoneal drain.  The collecting system is decompressed and there is no evidence of a focal urinoma or abscess.  The kidney itself shows normal perfusion.
PELVIS CT WITHOUT AND WITH CONTRAST ? [DATE]:
FINDINGS: Unenhanced imaging through the pelvis was performed showing a ureteral stent into the bladder on the right postoperatively.  The bladder itself is decompressed by a Foley catheter.  There is no urinary extravasation on delayed imaging through the bladder in the pelvis.  The bowel loops are of normal caliber.
IMPRESSION: The distal portion of the ureteral stent extends into the bladder and there is no evidence of urinary extravasation or fluid collections in the pelvis.

## 2006-10-28 IMAGING — XA IR US GUIDANCE
1 series · 2 of 2 positions shown · IV contrast (omnipaque)
Comparison: none

CLINICAL DATA: The patient is status-post repair of a right ureteropelvic junction obstruction with use of ileum to form an ileal ureter.  Clinically, there has been a urine leak at the surgical site with recent CT on [DATE] demonstrating evidence of extravasated urine surrounding the renal pelvis.  She now presents for percutaneous nephrostomy to divert urine. 
RIGHT PERCUTANEOUS NEPHROSTOMY TUBE PLACEMENT WITH FLUOROSCOPY AND ULTRASOUND GUIDANCE:
Prior to the procedure, informed consent was obtained.
Sedation: 4 mg IV Versed, 100 mcg IV fentanyl.
Total Monitoring Station Time: 40 min.
Contrast:  35 cc Omnipaque 300.
Fluoro Time: 8.1 min.
Other Medications:   4 mg IV Zofran.
Procedure:   The patient was placed in a prone position and the right flank region sterilely prepped and draped. Local anesthesia during the procedure was provided with 1% lidocaine. 
Fluoroscopy was used to localize the right ureteral stent and surgical drain.  Under ultrasound guidance, a 21-gauge needle was advanced into a lower pole calyx.  Attempt was made to advance a guidewire into the central collecting system. Eventually, a second needle access was performed of a posterolateral mid pole calyx under fluoroscopic guidance with a 21-gauge needle.  A guidewire was then advanced into the collecting system. Tract dilatation was then performed to 10-French.
A 10-French nephrostomy was initially advanced over the guidewire.  This was advanced into the central collecting system and was attempted to be formed. Contrast injection was performed through the nephrostomy tube.  The tube was then retracted and formed more peripherally in the collecting system. The tube was injected with contrast material to confirm position. 
The tube was secured at the skin with a 0-Prolene retention suture and adhesive retention device. The tube was irrigated with saline and attached to a gravity drainage bag. 
Complications: None.

[Series 1000: run · 2 of 2 slices shown]
[im 1/2]
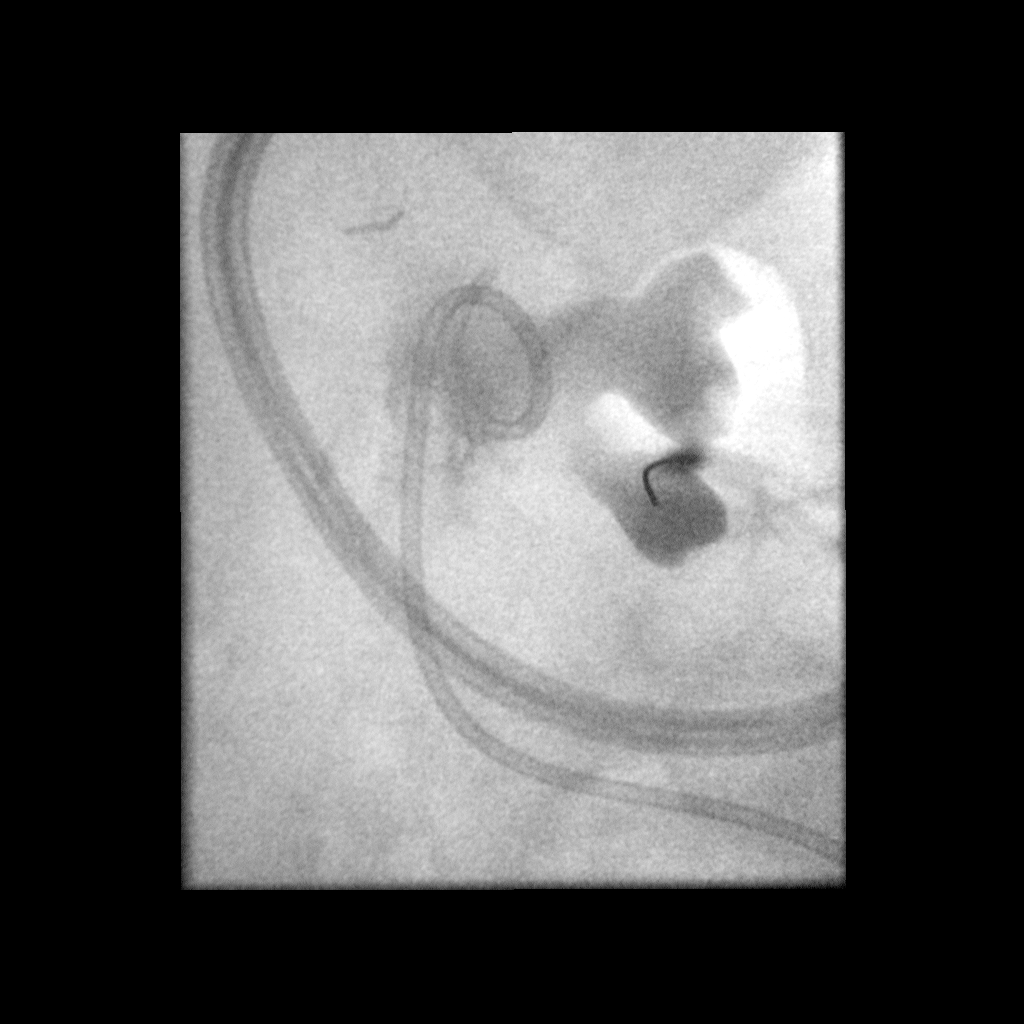
[im 2/2]
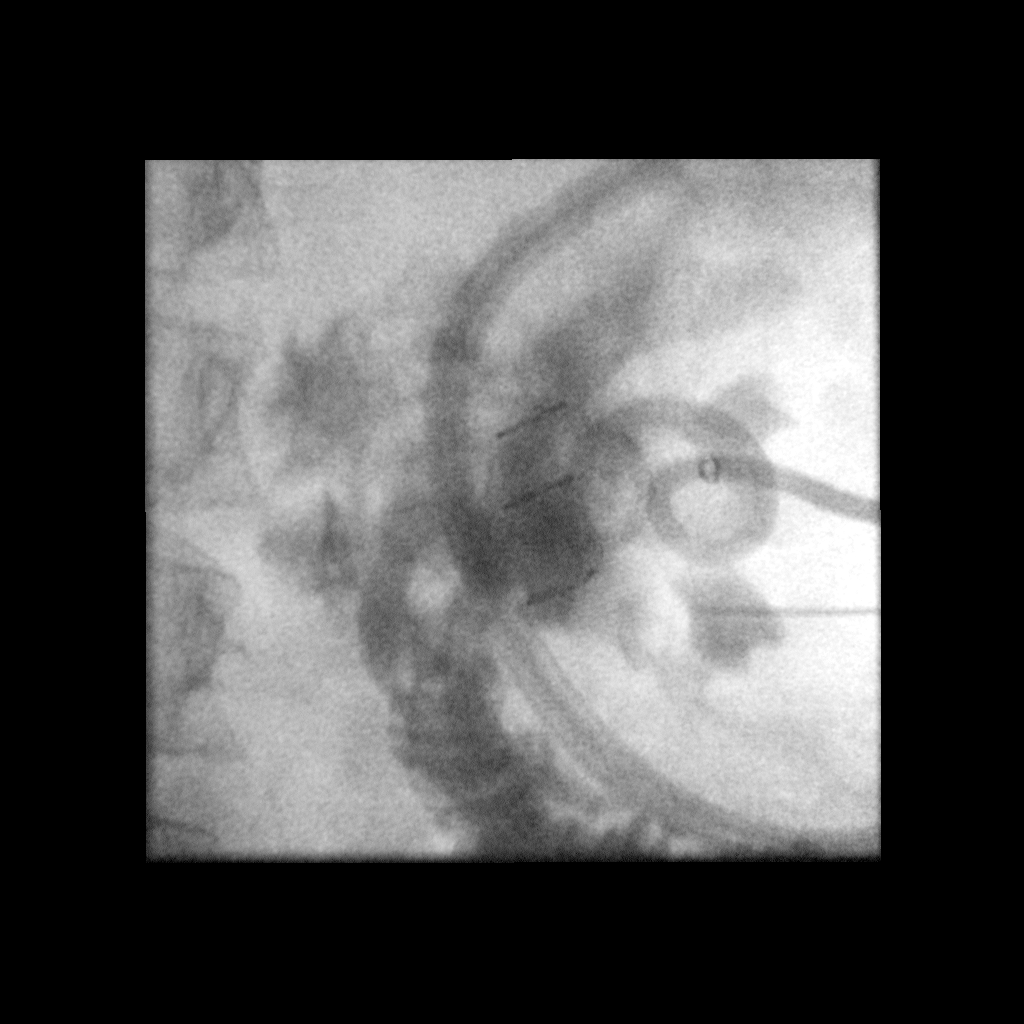

[2 of 2 positions shown; findings below may reference images not displayed]

FINDINGS: With initial lower pole calyceal access, injection of contrast material was able to be accomplished into the collecting system, which opacified to the level of the pelvis.  A guidewire would not advance well into the pelvis to allow nephrostomy placement.  A second puncture was therefore performed at a mid lateral calyceal level.  Once again, the nephrostomy tube was advanced into the region of the pelvis and injected contrast material was seen to extravasate and the tube could not be fully formed at the level of the proximal aspect of the ureteral stent. The tube was therefore retracted more laterally, such that the tube curls in a mid pole calyx with the tip then extending into an infundibulum.  The tube will be left in this position to allow percutaneous diversion of urine.
IMPRESSION: Placement of 10-French nephrostomy tube via mid pole calyx. As above, the tube could not be fully formed at the level of the renal pelvis with a fairly large amount of extravasation noted of injected contrast material.  The catheter was therefore formed in a lateral mid pole calyx with the tip extending into an infundibulum.  This will be left to gravity drainage.

## 2006-10-28 IMAGING — US IR US GUIDANCE
1 series · 3 of 3 positions shown · IV contrast (omnipaque)
Comparison: none

CLINICAL DATA: The patient is status-post repair of a right ureteropelvic junction obstruction with use of ileum to form an ileal ureter.  Clinically, there has been a urine leak at the surgical site with recent CT on [DATE] demonstrating evidence of extravasated urine surrounding the renal pelvis.  She now presents for percutaneous nephrostomy to divert urine. 
RIGHT PERCUTANEOUS NEPHROSTOMY TUBE PLACEMENT WITH FLUOROSCOPY AND ULTRASOUND GUIDANCE:
Prior to the procedure, informed consent was obtained.
Sedation: 4 mg IV Versed, 100 mcg IV fentanyl.
Total Monitoring Station Time: 40 min.
Contrast:  35 cc Omnipaque 300.
Fluoro Time: 8.1 min.
Other Medications:   4 mg IV Zofran.
Procedure:   The patient was placed in a prone position and the right flank region sterilely prepped and draped. Local anesthesia during the procedure was provided with 1% lidocaine. 
Fluoroscopy was used to localize the right ureteral stent and surgical drain.  Under ultrasound guidance, a 21-gauge needle was advanced into a lower pole calyx.  Attempt was made to advance a guidewire into the central collecting system. Eventually, a second needle access was performed of a posterolateral mid pole calyx under fluoroscopic guidance with a 21-gauge needle.  A guidewire was then advanced into the collecting system. Tract dilatation was then performed to 10-French.
A 10-French nephrostomy was initially advanced over the guidewire.  This was advanced into the central collecting system and was attempted to be formed. Contrast injection was performed through the nephrostomy tube.  The tube was then retracted and formed more peripherally in the collecting system. The tube was injected with contrast material to confirm position. 
The tube was secured at the skin with a 0-Prolene retention suture and adhesive retention device. The tube was irrigated with saline and attached to a gravity drainage bag. 
Complications: None.

[Series 1: unknown · 0.30mm/px · 3 of 3 slices shown]
[im 1/3]
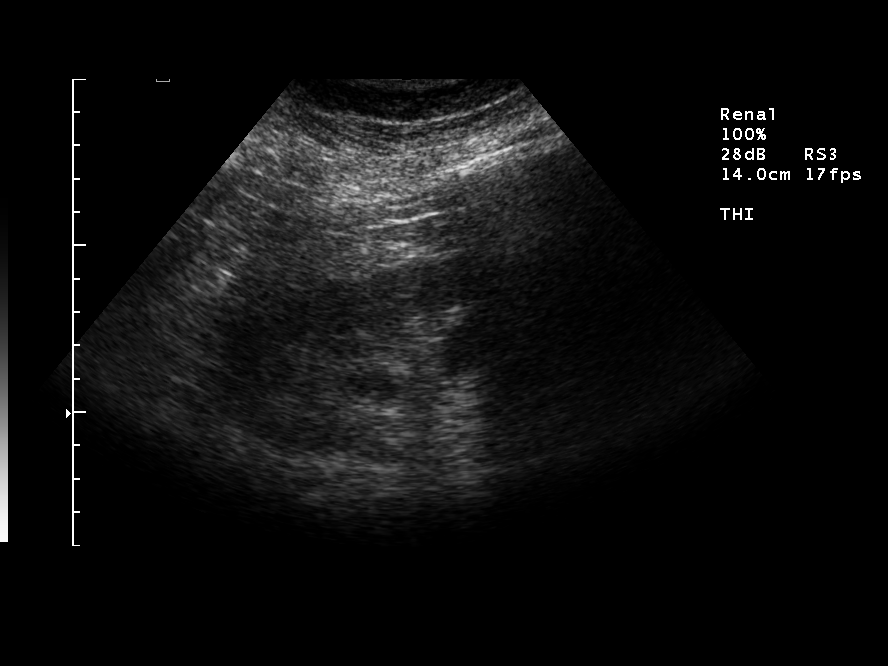
[im 2/3]
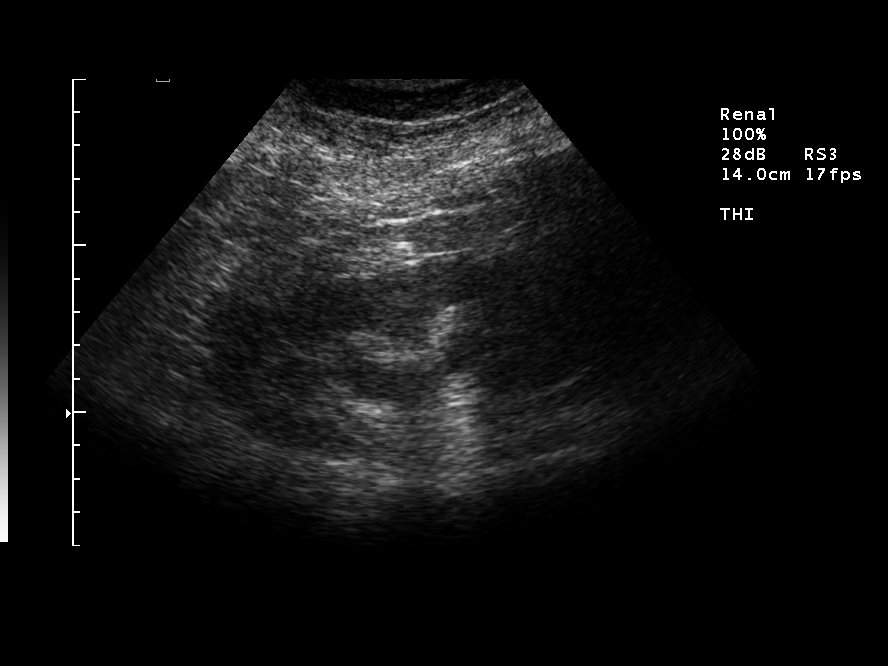
[im 3/3]
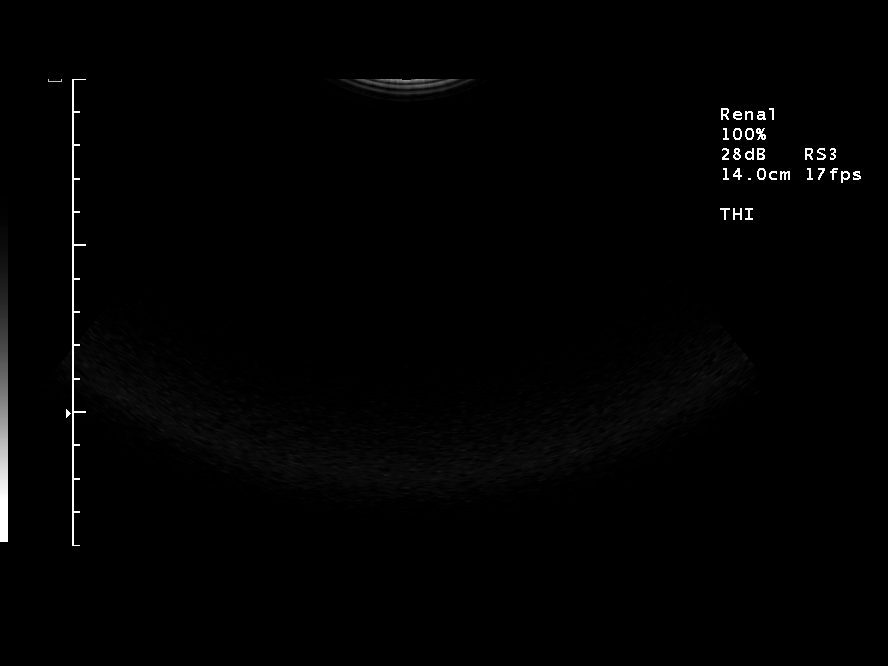

[3 of 3 positions shown; findings below may reference images not displayed]

FINDINGS: With initial lower pole calyceal access, injection of contrast material was able to be accomplished into the collecting system, which opacified to the level of the pelvis.  A guidewire would not advance well into the pelvis to allow nephrostomy placement.  A second puncture was therefore performed at a mid lateral calyceal level.  Once again, the nephrostomy tube was advanced into the region of the pelvis and injected contrast material was seen to extravasate and the tube could not be fully formed at the level of the proximal aspect of the ureteral stent. The tube was therefore retracted more laterally, such that the tube curls in a mid pole calyx with the tip then extending into an infundibulum.  The tube will be left in this position to allow percutaneous diversion of urine.
IMPRESSION: Placement of 10-French nephrostomy tube via mid pole calyx. As above, the tube could not be fully formed at the level of the renal pelvis with a fairly large amount of extravasation noted of injected contrast material.  The catheter was therefore formed in a lateral mid pole calyx with the tip extending into an infundibulum.  This will be left to gravity drainage.

## 2006-10-30 IMAGING — RF DG CYSTOGRAM 3+V
11 series · 11 of 11 positions shown · non-contrast
Comparison: none

CLINICAL DATA: Evaluate for leak.  
 CYSTOGRAM:
TECHNIQUE: Through the indwelling Foley catheter, approximately 100 cc of Cystografin was infused into the bladder.

[Series 1: run · 1 of 1 slices shown (1 of 10)]
[im 1/1]
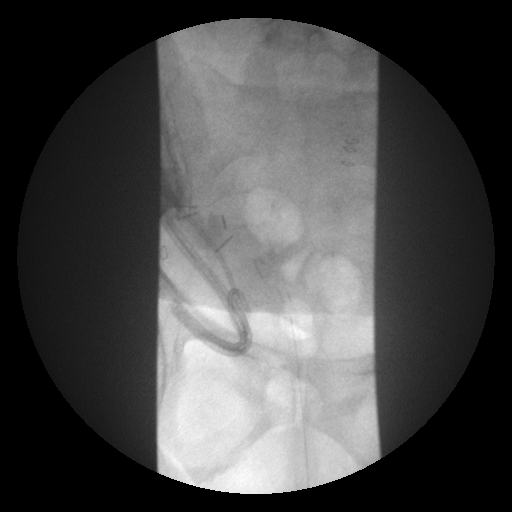

[Series 2: run · 1 of 1 slices shown (2 of 10)]
[im 1/1]
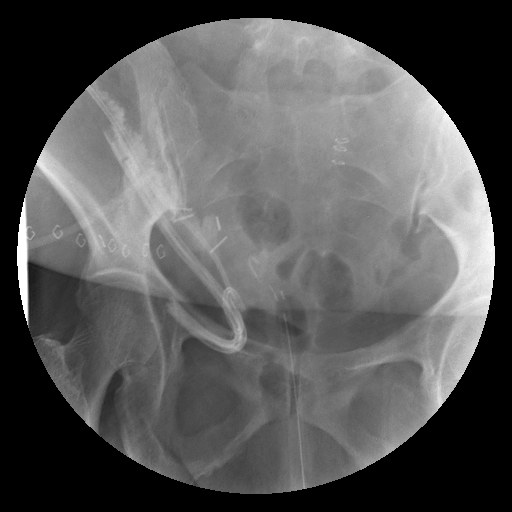

[Series 3: run · 1 of 1 slices shown (3 of 10)]
[im 1/1]
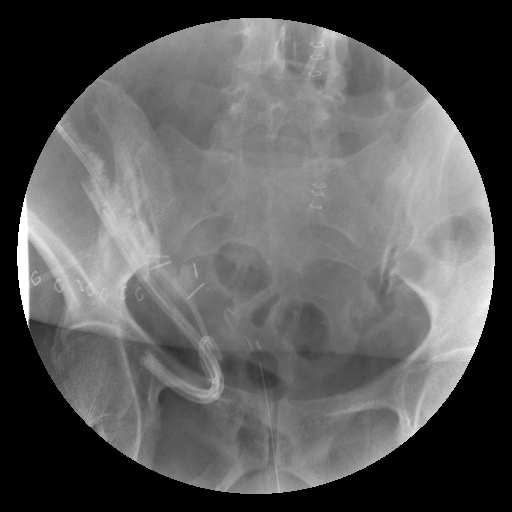

[Series 4: run · 1 of 1 slices shown (4 of 10)]
[im 1/1]
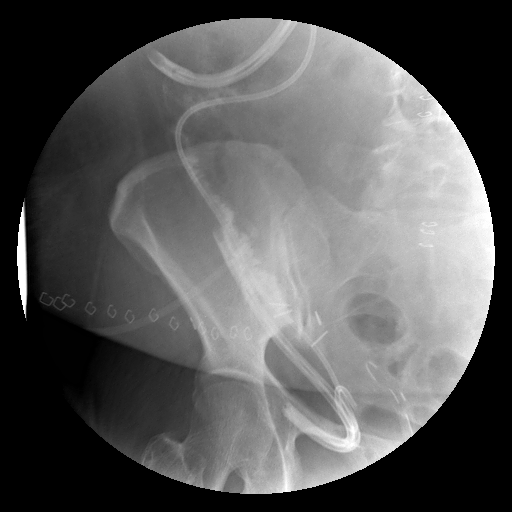

[Series 5: run · 1 of 1 slices shown (5 of 10)]
[im 1/1]
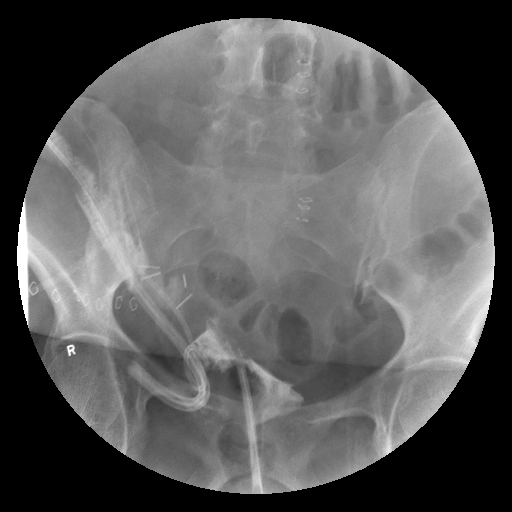

[Series 6: run · 1 of 1 slices shown (6 of 10)]
[im 1/1]
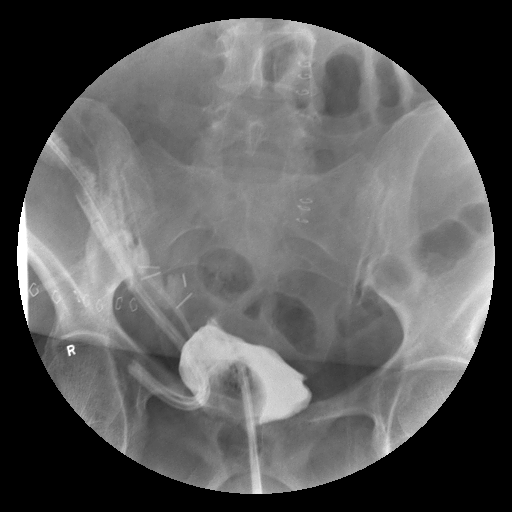

[Series 7: run · 1 of 1 slices shown (7 of 10)]
[im 1/1]
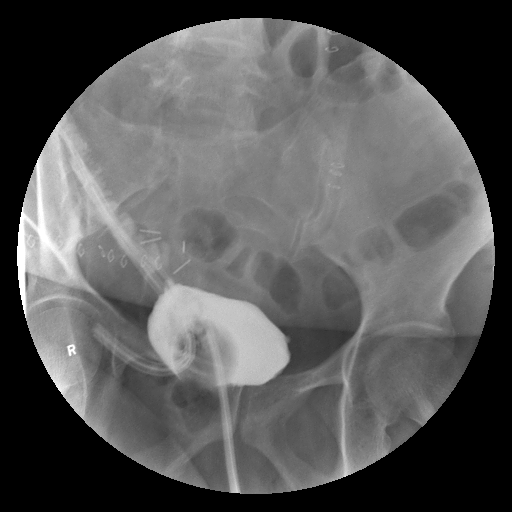

[Series 8: run · 1 of 1 slices shown (8 of 10)]
[im 1/1]
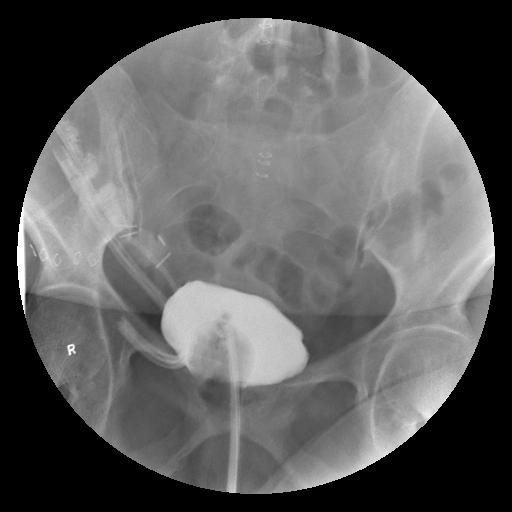

[Series 9: run · 1 of 1 slices shown (9 of 10)]
[im 1/1]
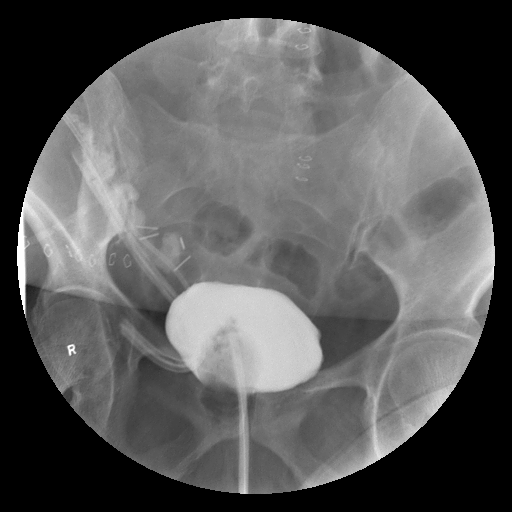

[Series 10: run · 1 of 1 slices shown (10 of 10)]
[im 1/1]
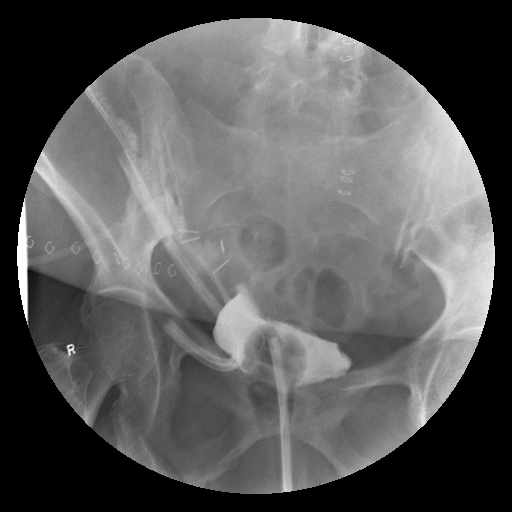

[Series 1001: view not recorded · 0.20mm/px · 1 of 1 slices shown]
[im 1/1]
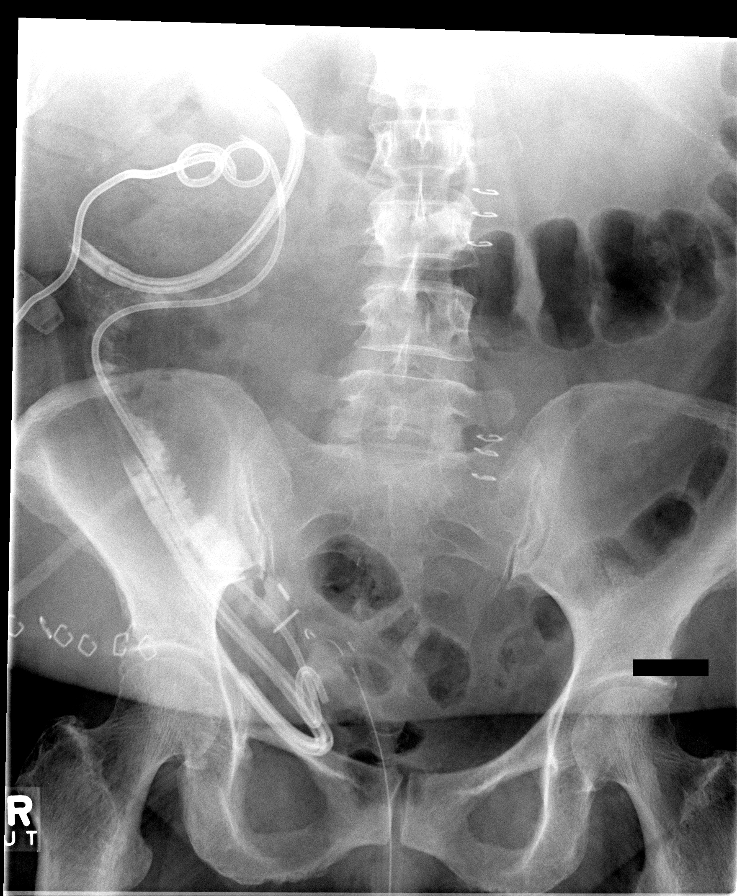

[11 of 11 positions shown; findings below may reference images not displayed]

FINDINGS: There is no evidence of contrast extravasation beyond the confines of the bladder.  No leakage is detected.  Bladder contours are well defined.
IMPRESSION: Negative for bladder leak.

## 2006-11-05 ENCOUNTER — Emergency Department (HOSPITAL_COMMUNITY): Admission: EM | Admit: 2006-11-05 | Discharge: 2006-11-06 | Payer: Self-pay | Admitting: Emergency Medicine

## 2006-11-06 ENCOUNTER — Ambulatory Visit (HOSPITAL_COMMUNITY): Admission: RE | Admit: 2006-11-06 | Discharge: 2006-11-06 | Payer: Self-pay | Admitting: Urology

## 2006-11-06 IMAGING — RF DG NEPHROSTOGRAM RIGHT
18 of 19 series · 18 of 19 positions shown · non-contrast
Comparison: none

CLINICAL DATA: The patient is status post UPJ repair with use of loop of ileum in substitution for the native ureter.  
NEPHROSTOGRAM:

[Series 1: run · 1 of 1 slices shown (1 of 17)]
[im 1/1]
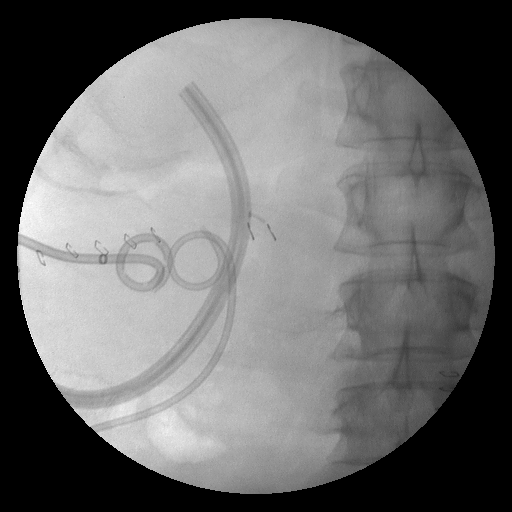

[Series 2: run · 1 of 1 slices shown (2 of 17)]
[im 1/1]
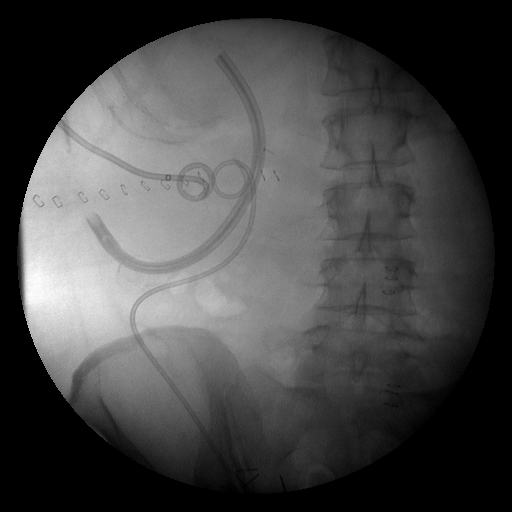

[Series 3: run · 1 of 1 slices shown (3 of 17)]
[im 1/1]
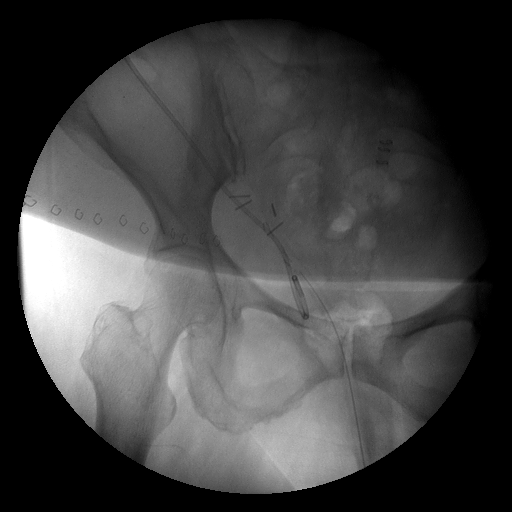

[Series 4: run · 1 of 1 slices shown (4 of 17)]
[im 1/1]
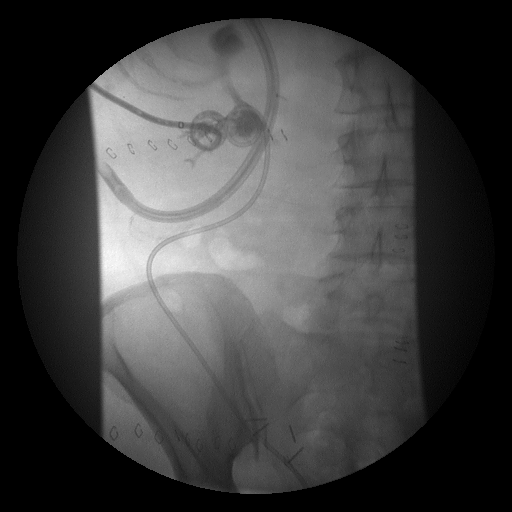

[Series 5: run · 1 of 1 slices shown (5 of 17)]
[im 1/1]
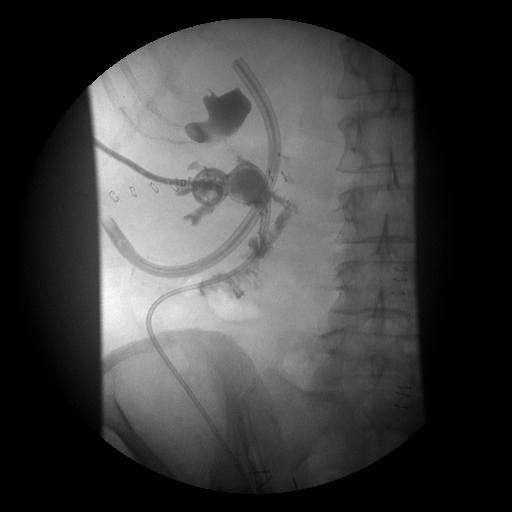

[Series 6: run · 1 of 1 slices shown (6 of 17)]
[im 1/1]
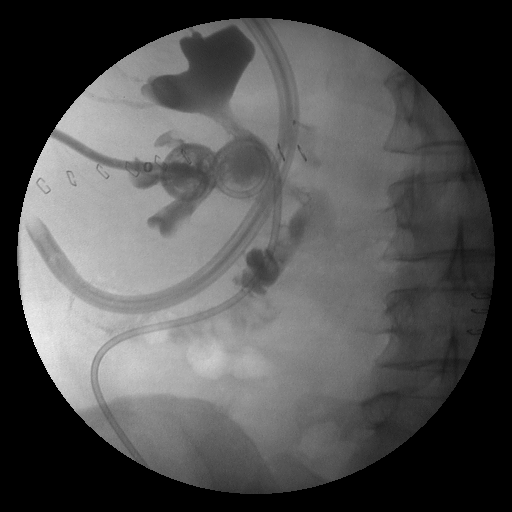

[Series 7: run · 1 of 1 slices shown (7 of 17)]
[im 1/1]
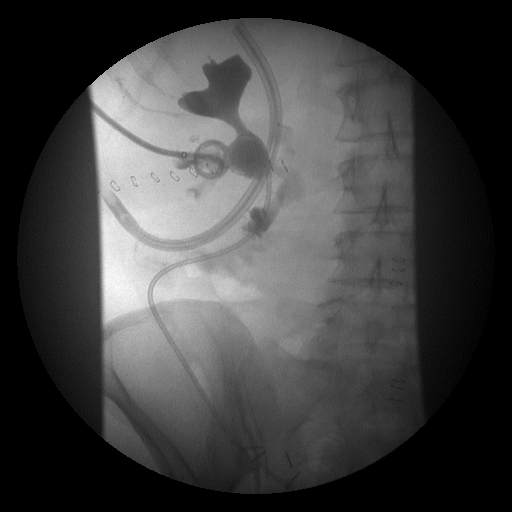

[Series 8: run · 1 of 1 slices shown (8 of 17)]
[im 1/1]
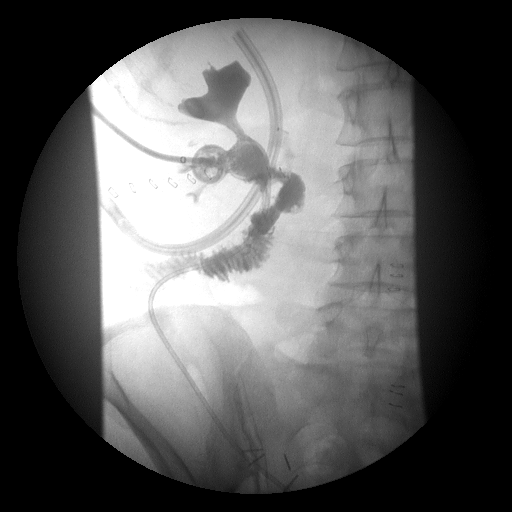

[Series 9: run · 1 of 1 slices shown (9 of 17)]
[im 1/1]
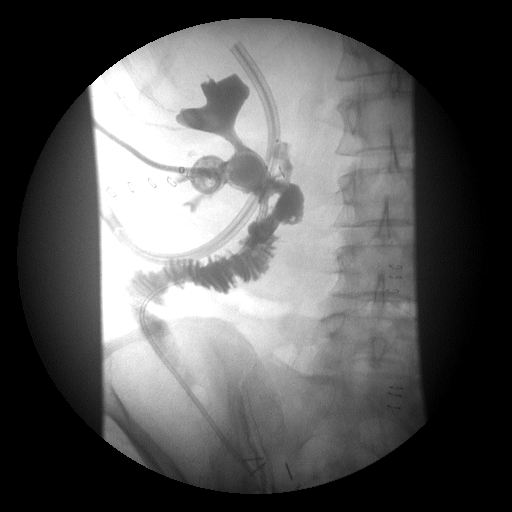

[Series 11: run · 1 of 1 slices shown (10 of 17)]
[im 1/1]
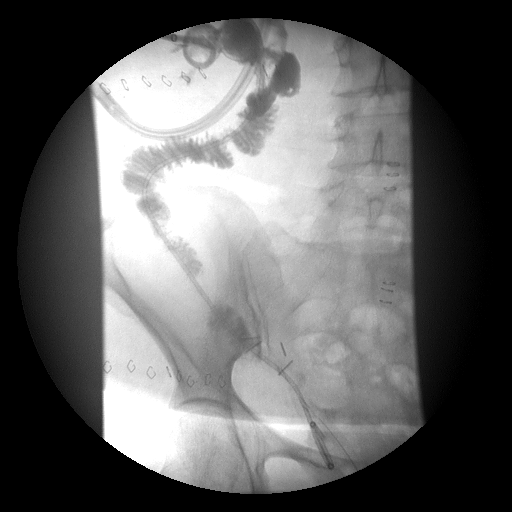

[Series 12: run · 1 of 1 slices shown (11 of 17)]
[im 1/1]
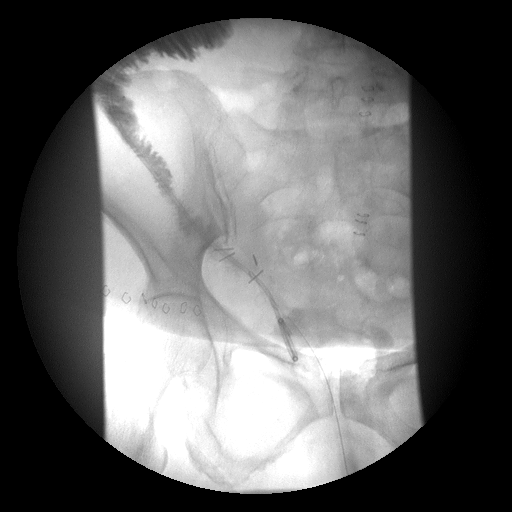

[Series 13: run · 1 of 1 slices shown (12 of 17)]
[im 1/1]
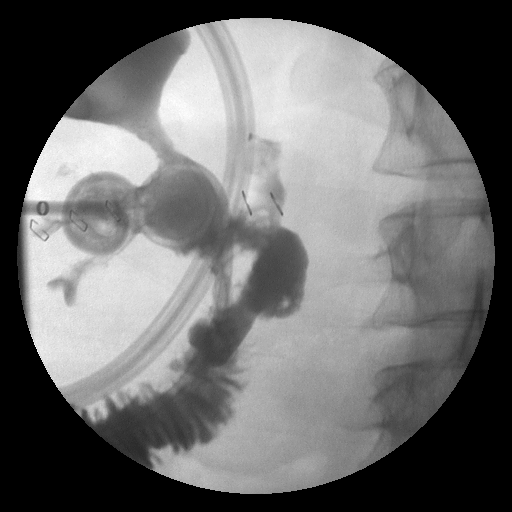

[Series 14: run · 1 of 1 slices shown (13 of 17)]
[im 1/1]
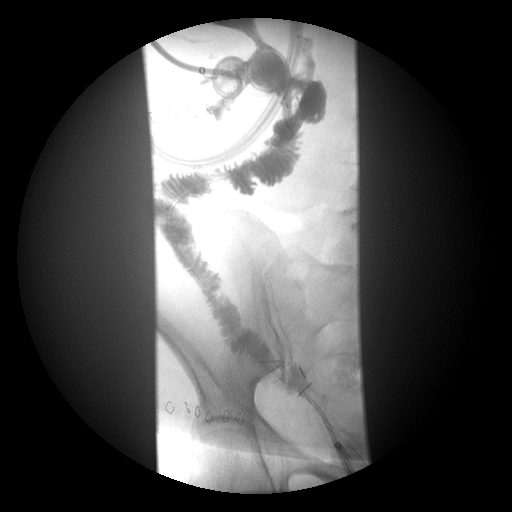

[Series 15: run · 1 of 1 slices shown (14 of 17)]
[im 1/1]
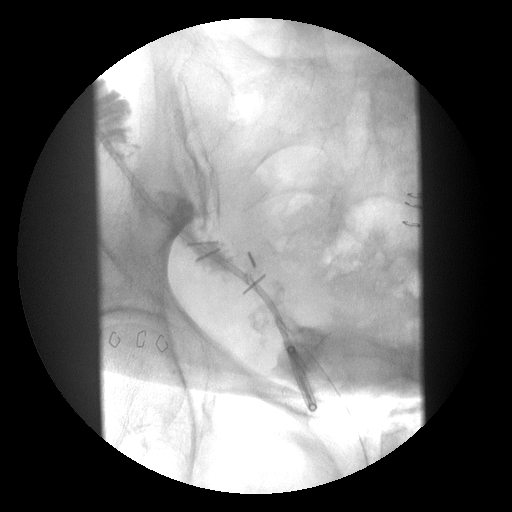

[Series 16: run · 1 of 1 slices shown (15 of 17)]
[im 1/1]
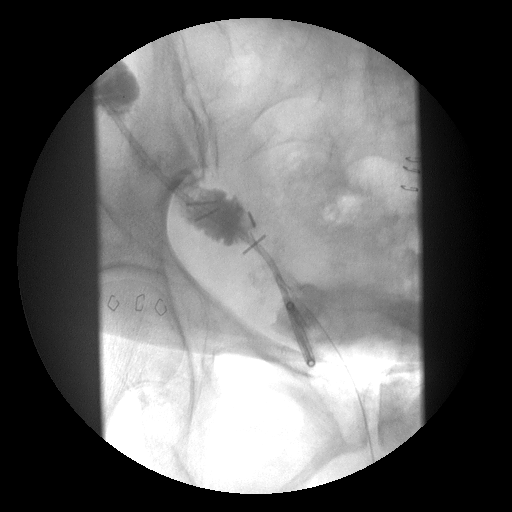

[Series 17: run · 1 of 1 slices shown (16 of 17)]
[im 1/1]
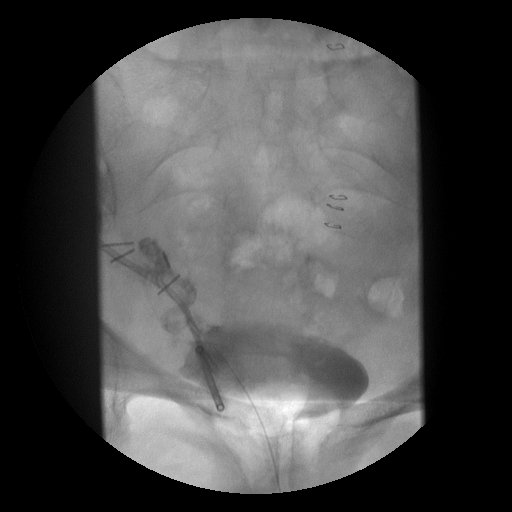

[Series 18: run · 1 of 1 slices shown (17 of 17)]
[im 1/1]
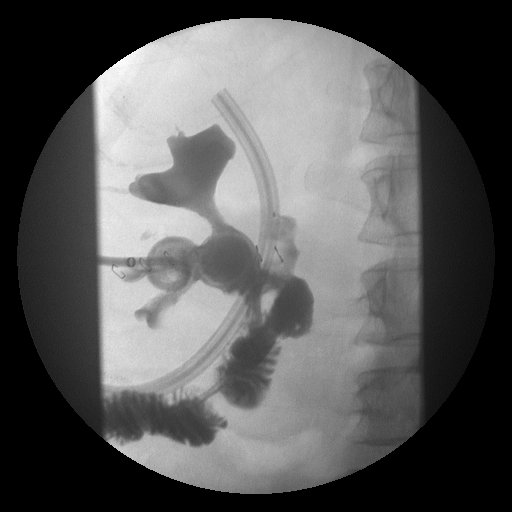

[Series 1001: view not recorded · 0.20mm/px · 1 of 1 slices shown]
[im 1/1]
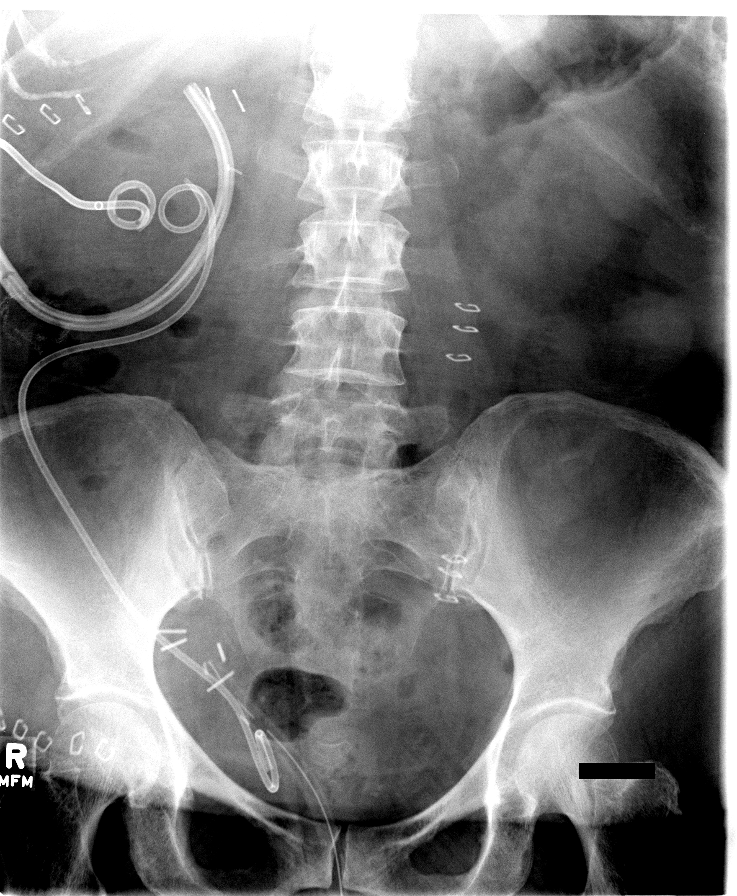

[18 of 19 positions shown; findings below may reference images not displayed]

FINDINGS: Surgical drain is in place in the upper quadrant.  Double J stent is in place at the patient?s neoureter.  Nephrostomy tube was injected by LORENSO   The collecting system is opacified with a leak of contrast material at the superior aspect of the UPJ, as was seen on CT scan of [DATE].  Contrast material fills the patient?s neoureter and was traced down into the urinary bladder.  No definite leak of contrast material was identified at the ureterovesical junction.
IMPRESSION: Persistent leak of contrast material at the UPJ in this patient?s neoureter formation.

## 2006-11-17 ENCOUNTER — Ambulatory Visit: Admission: RE | Admit: 2006-11-17 | Discharge: 2006-11-17 | Payer: Self-pay | Admitting: Urology

## 2006-11-17 IMAGING — XA IR NEPHROSTOGRAM*R*
1 series · 13 of 17 positions shown · non-contrast
Comparison: [DATE].

CLINICAL DATA: Right renal pelvis leak.
 RIGHT NEPHROSTOGRAM ? [DATE] AT [2X] HOURS:

[Series 1: run · 13 of 17 slices shown]
[im 1/17]
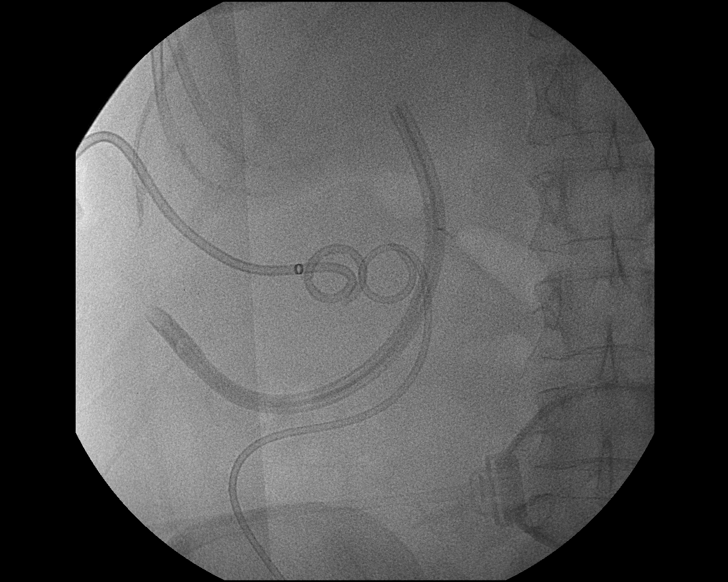
[im 2/17]
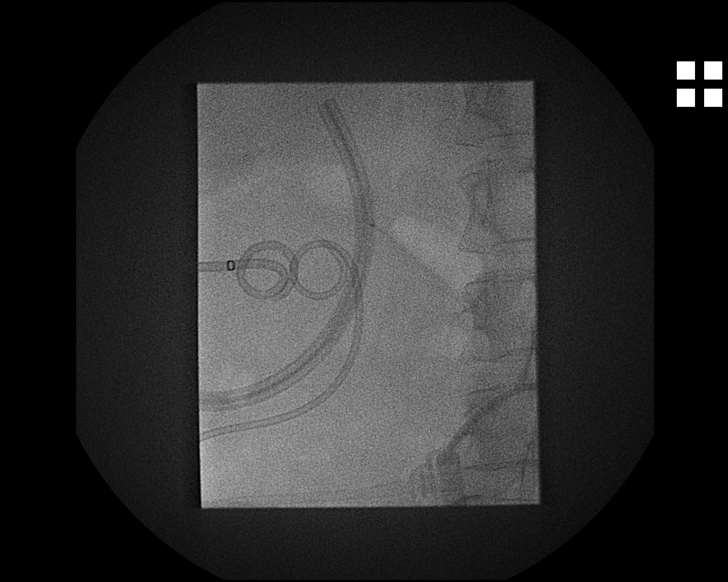
[im 4/17]
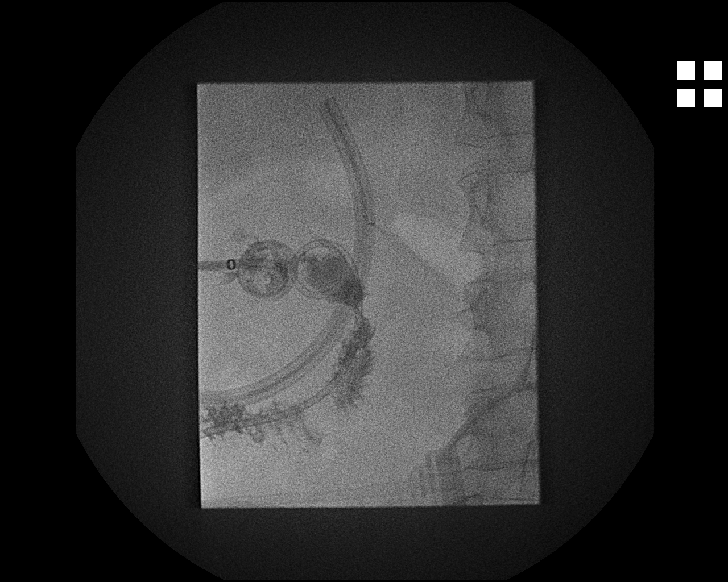
[im 5/17]
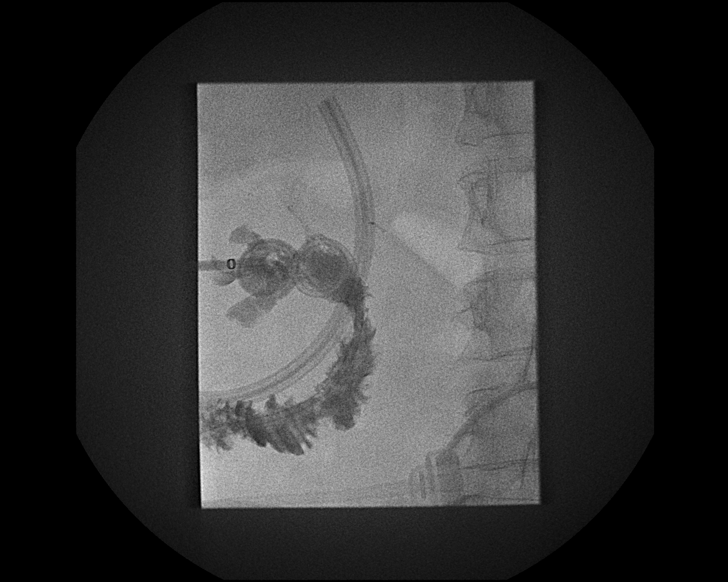
[im 6/17]
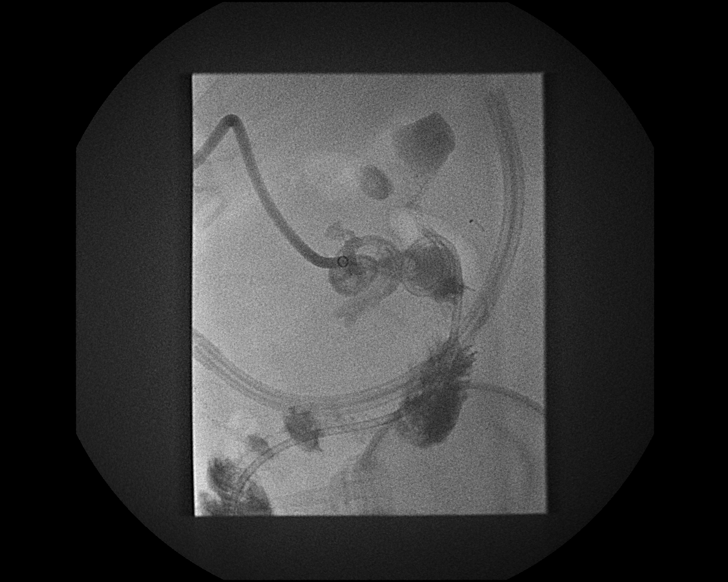
[im 8/17]
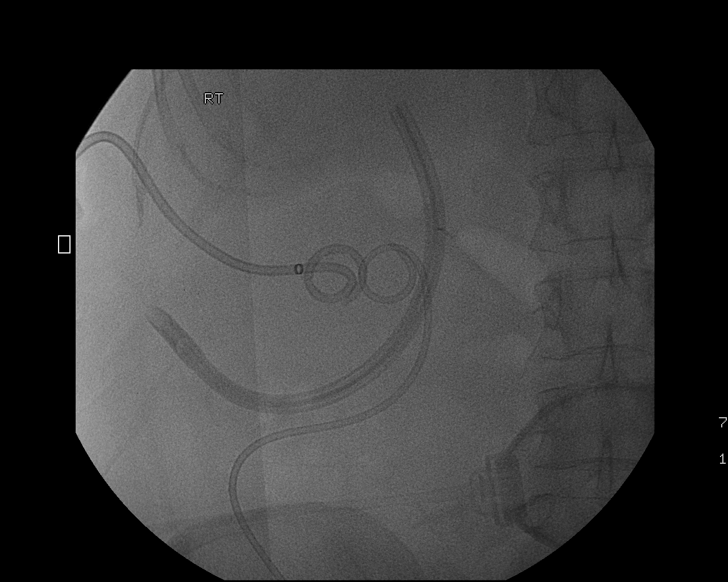
[im 9/17]
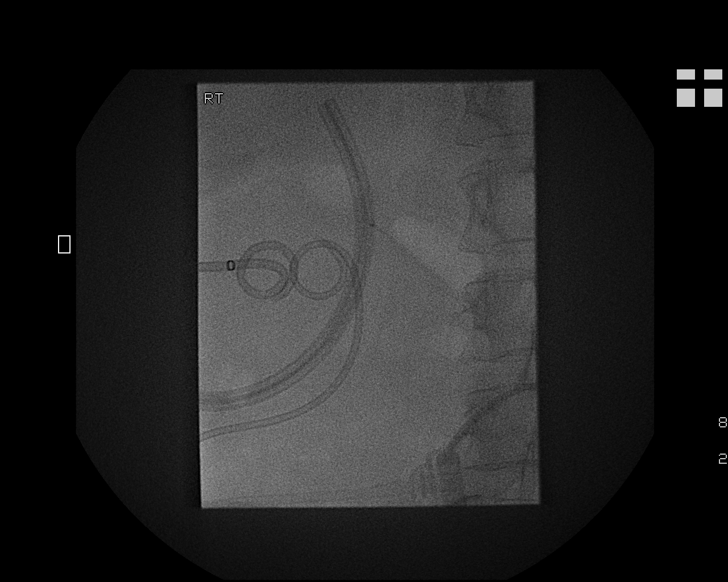
[im 10/17]
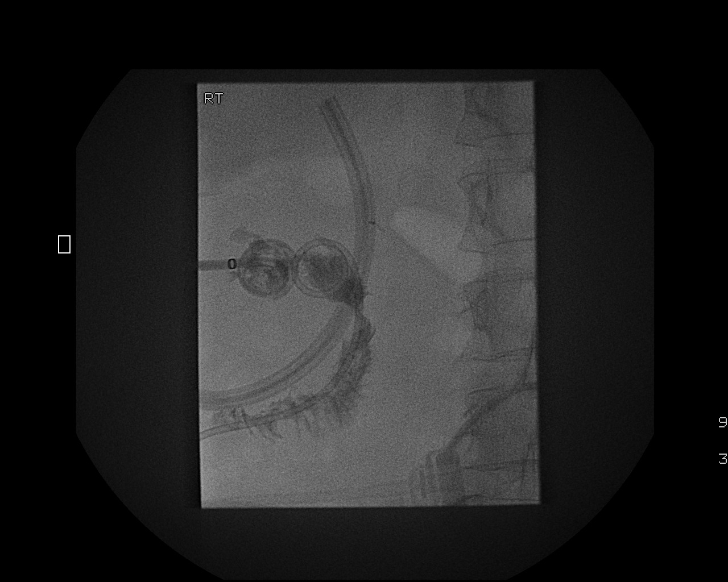
[im 12/17]
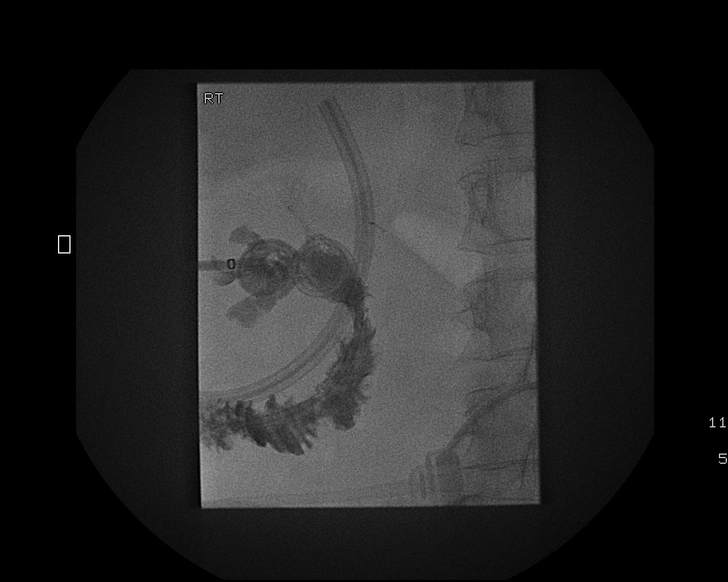
[im 13/17]
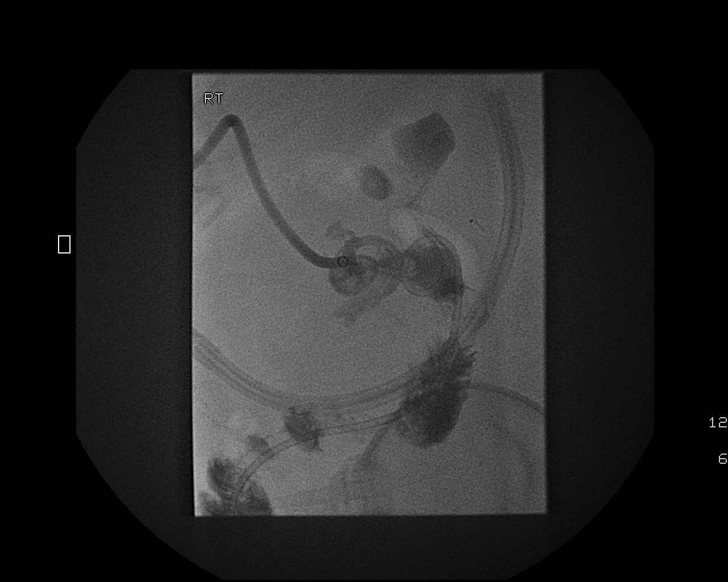
[im 14/17]
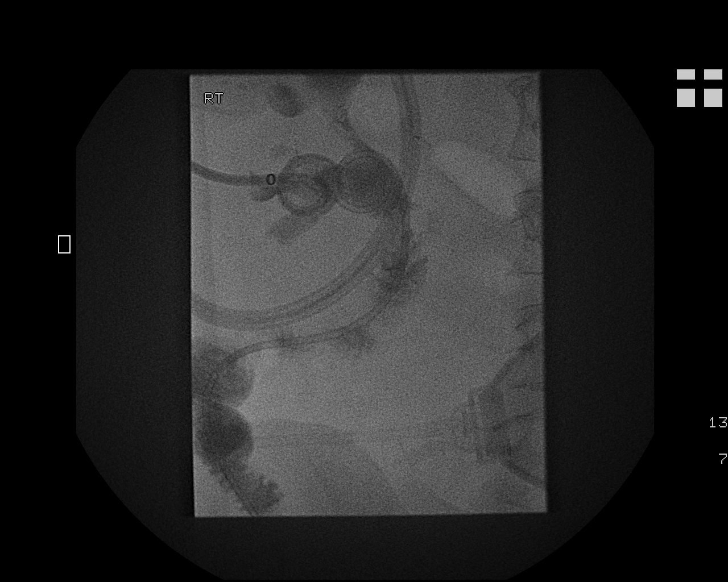
[im 16/17]
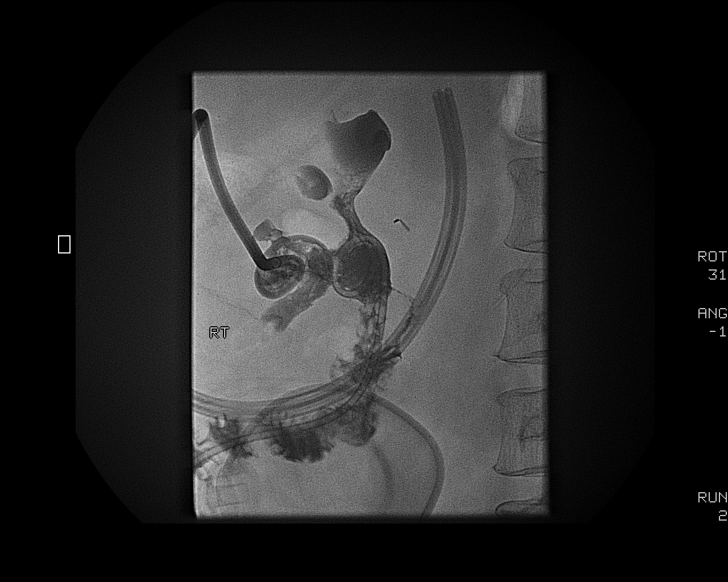
[im 17/17]
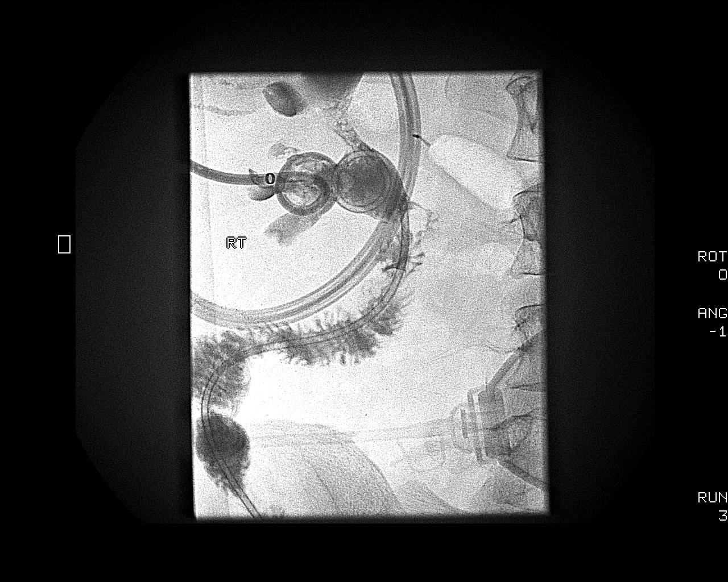

[13 of 17 positions shown; findings below may reference images not displayed]

FINDINGS: A scout image over the right kidney was obtained.  Multiple images after injecting contrast through the right nephrostomy were obtained.  No complications.
FINDINGS: The double-J ureteral stent, drain, and right nephrostomy are all stable in position.  A very small amount of contrast is seen extravasating from the renal pelvis in the location of the previously noted leak.  It has, however, significantly improved.  Only a small amount of contrast extravasated.
IMPRESSION: The leak from the right renal pelvis has significantly improved.  Follow-up nephrostogram in 10 days is recommended.  Once the leak has resolved, the nephrostomy should be removed.

## 2006-12-02 ENCOUNTER — Ambulatory Visit (HOSPITAL_COMMUNITY): Admission: RE | Admit: 2006-12-02 | Discharge: 2006-12-02 | Payer: Self-pay | Admitting: Urology

## 2006-12-02 IMAGING — XA IR NEPHROSTOGRAM*R*
1 series · 13 of 21 positions shown · non-contrast
Comparison: none

CLINICAL DATA: UPJ obstruction, status post small bowel interposition

[Series 1: run · 13 of 21 slices shown]
[im 1/21]
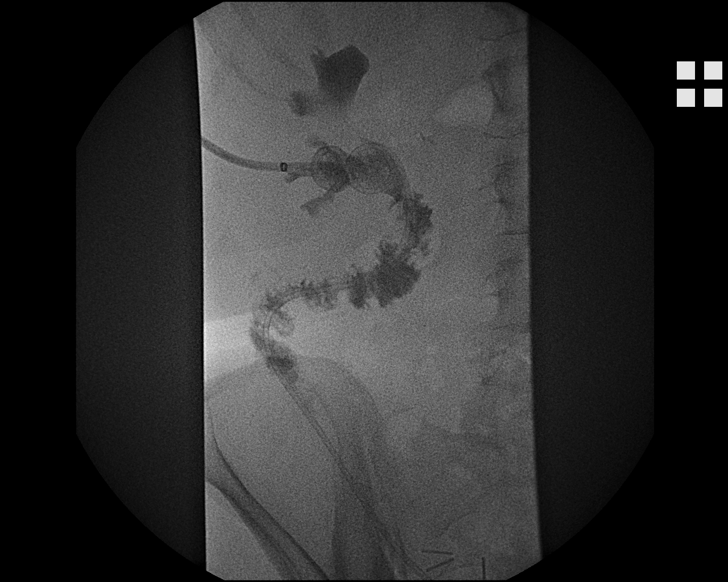
[im 3/21]
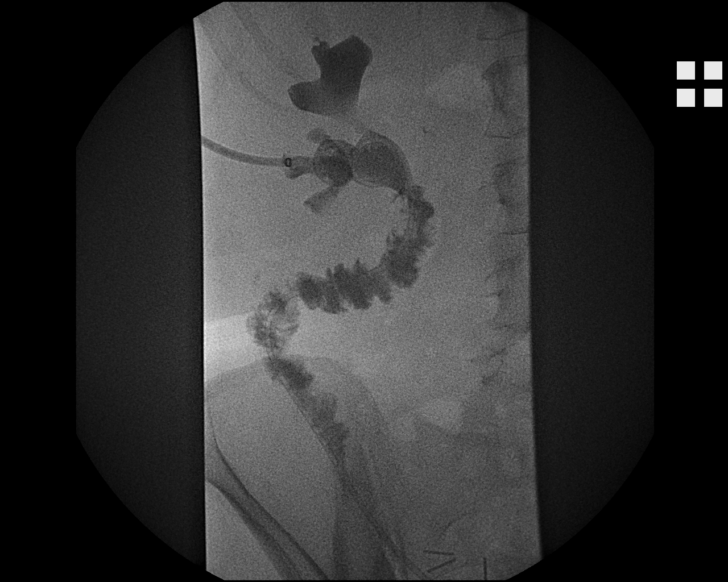
[im 5/21]
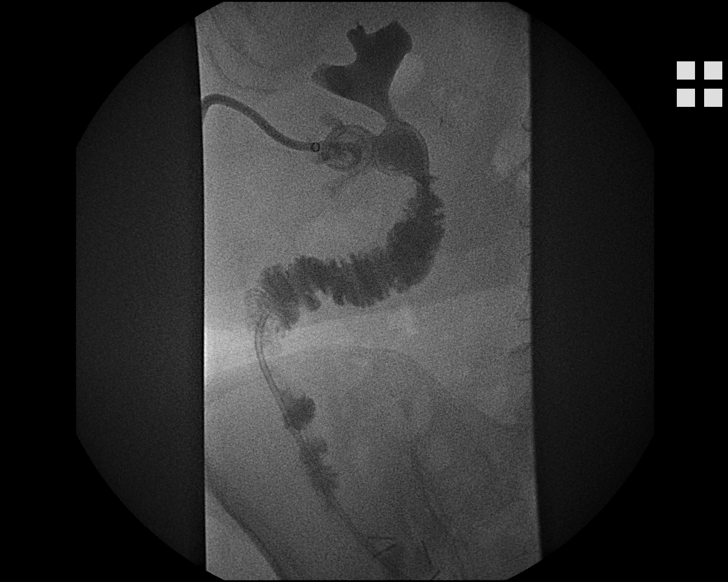
[im 6/21]
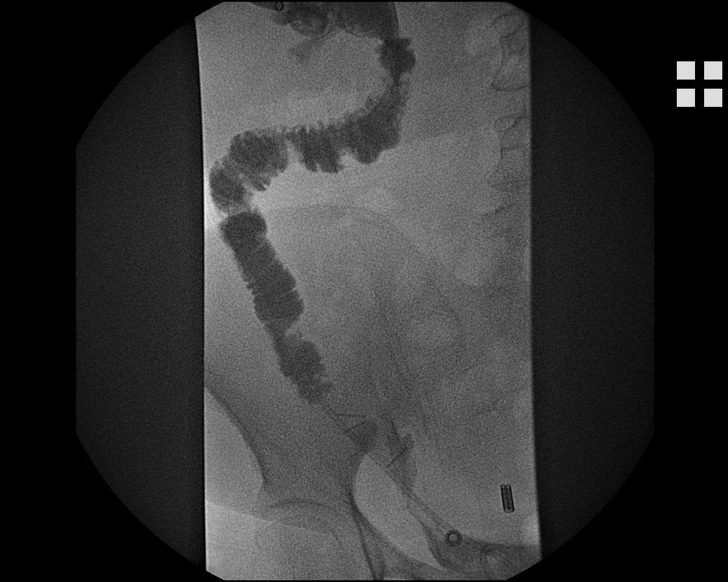
[im 8/21]
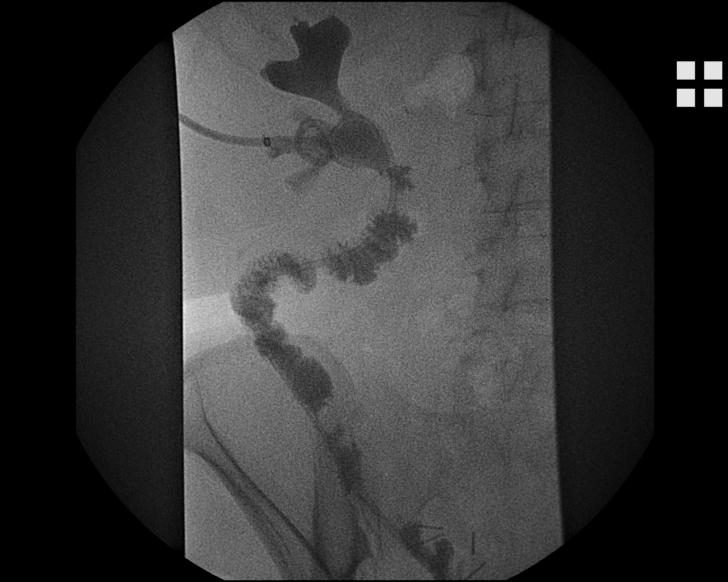
[im 9/21]
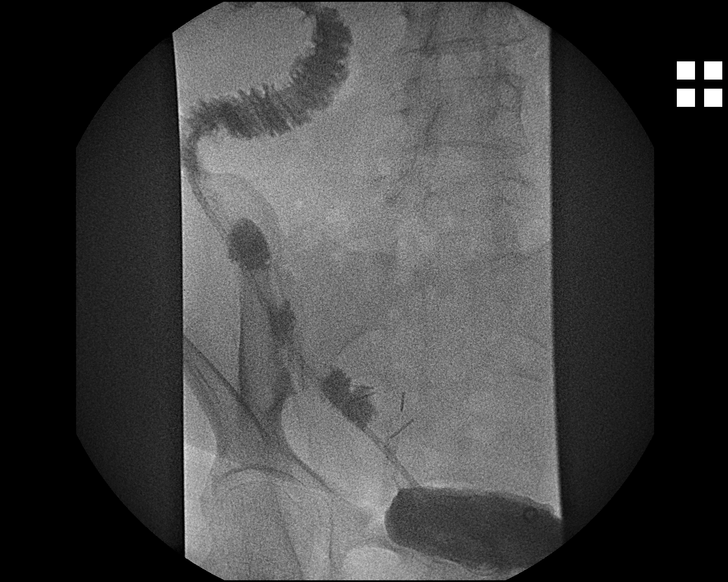
[im 11/21]
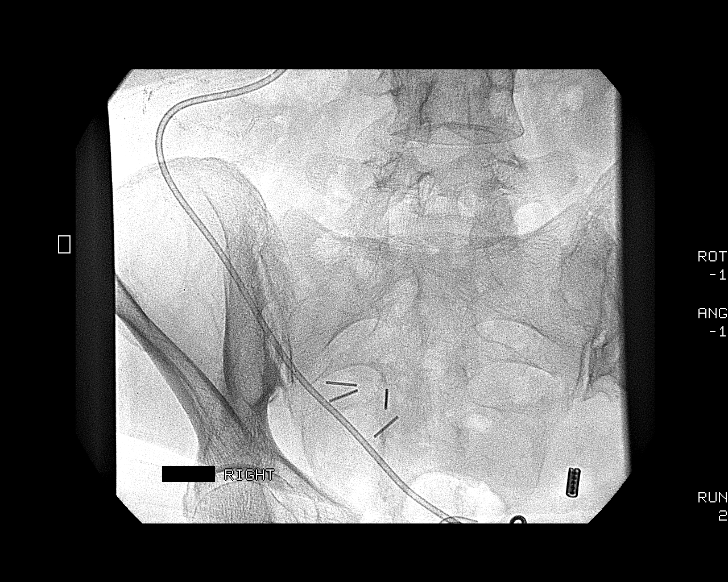
[im 13/21]
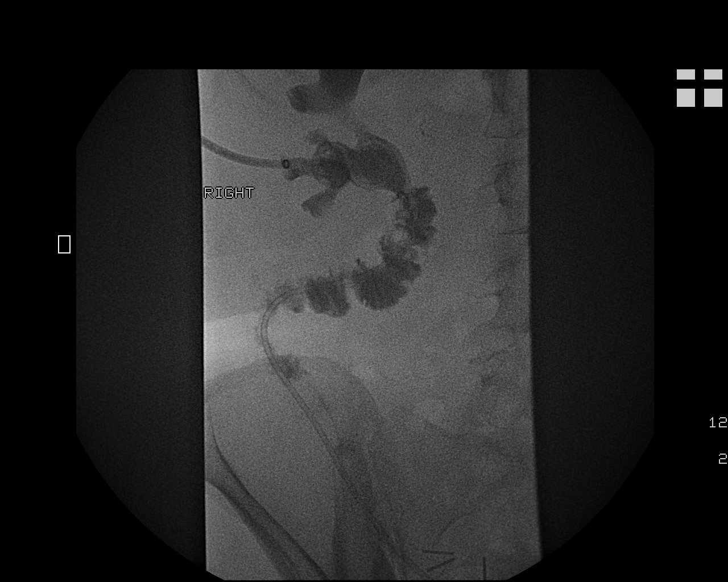
[im 14/21]
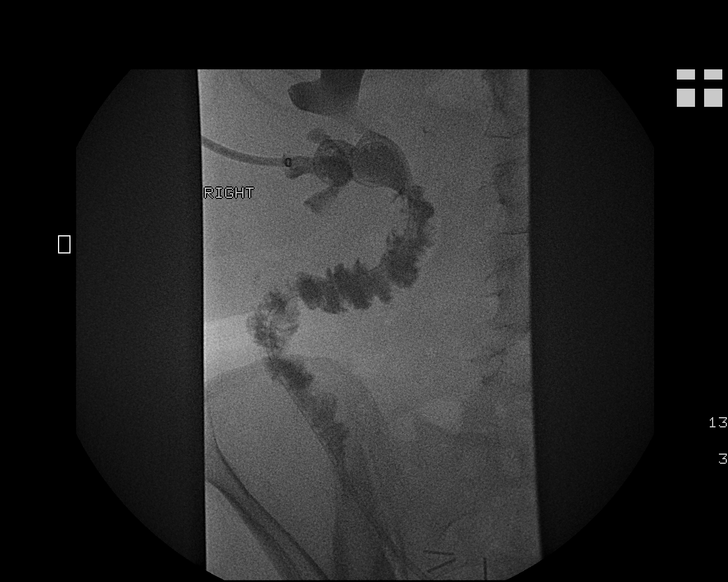
[im 16/21]
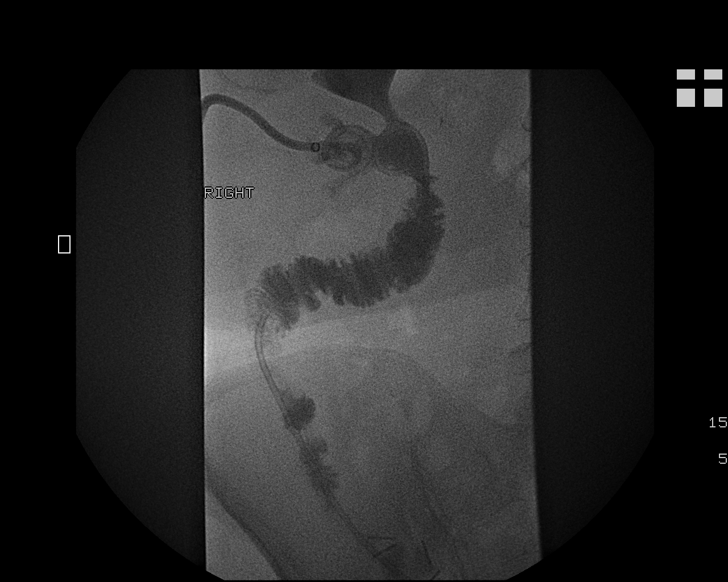
[im 17/21]
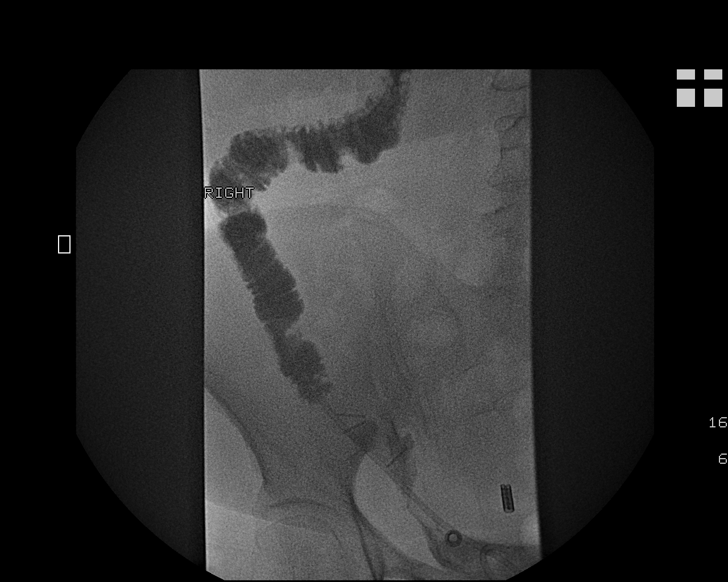
[im 19/21]
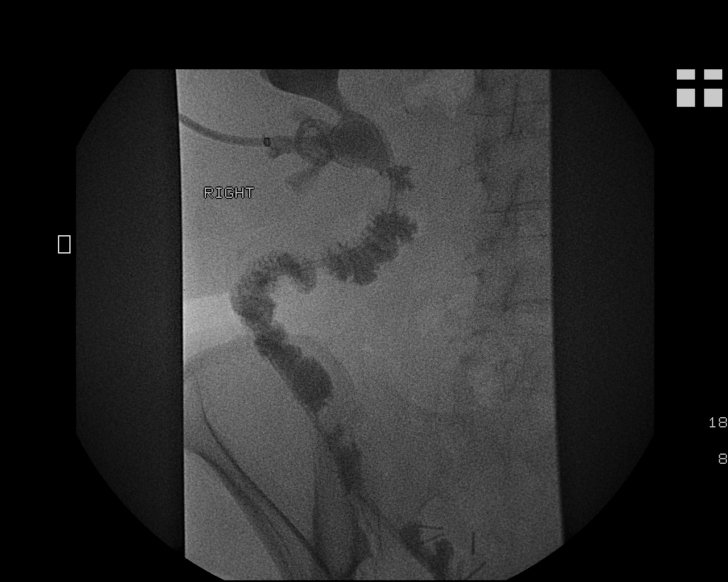
[im 21/21]
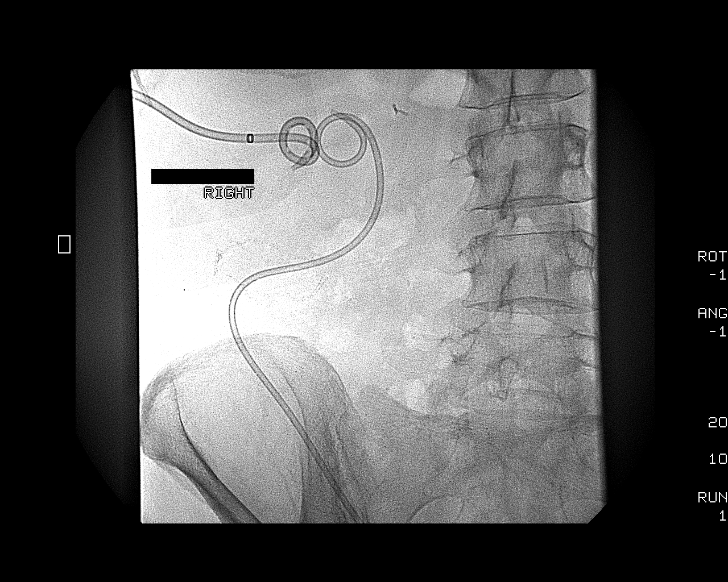

[13 of 21 positions shown; findings below may reference images not displayed]

Right antegrade nephrostogram:

Scout films show percutaneous nephrostomy catheter and double J ureteral stent
in stable position. Injection through nephrostomy opacifies the nondilated right
renal collecting system. The anastomosis with the interposed small bowel loop at
the UPJ is patent. There is no extravasation. The interposed small bowel loop is
decompressed and contrast flows on into the urinary bladder. The double J
ureteral stent is patent.
IMPRESSION: 1. No further leak from the proximal anastomosis.
2. Nephrostomy catheter and ureteral stent remain well positioned and patent.

## 2006-12-08 ENCOUNTER — Ambulatory Visit: Payer: Self-pay | Admitting: "Endocrinology

## 2006-12-10 ENCOUNTER — Ambulatory Visit (HOSPITAL_COMMUNITY): Admission: RE | Admit: 2006-12-10 | Discharge: 2006-12-10 | Payer: Self-pay | Admitting: Urology

## 2006-12-10 IMAGING — NM NM RENAL IMAGING FLOW W/ PHARM
2 series · 12 of 12 positions shown · non-contrast
Comparison: Previous imaging examinations, and including the nuclear medicine
renal scan dated [DATE].

CLINICAL DATA: Status post right ureteral small bowel interposition for a UPJ
obstruction.

NM RENAL SCAN
TECHNIQUE: Radionuclide angiographic and sequential renal images were obtained
after intravenous injection of radiopharmaceutical.  
Radiopharmaceutical:  14.9 mCi [H3] MAG3

[re renal qualitative · 9.51mm/px · 6 of 72 frames shown (1 of 2)]
[frame 7/72]
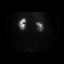
[frame 19/72]
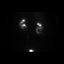
[frame 31/72]
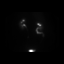
[frame 43/72]
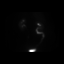
[frame 55/72]
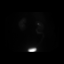
[frame 67/72]
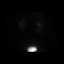

[re renal qualitative · 9.51mm/px · 6 of 40 frames shown (2 of 2)]
[frame 4/40]
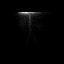
[frame 10/40]
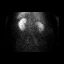
[frame 17/40]
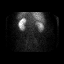
[frame 24/40]
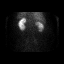
[frame 30/40]
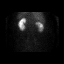
[frame 37/40]
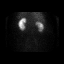

[12 of 12 positions shown; findings below may reference images not displayed]

FINDINGS: There is continued decreased blood flow to the right kidney relative
to the left kidney. There is also continued decreased tracer uptake by the right
kidney relative to the left kidney. The differential is currently 64% on the
left and 36% on the right, representing no significant change. There is improved
clearance of tracer from the right kidney and renal collecting system, with
tracer seen within the interposed small bowel loop. The clearance is symmetrical
bilaterally, following Lasix administration. Normal activity is again noted in
the left kidney, left ureter and urinary bladder.

IMPRESSION

Improved ureteropelvic junction obstruction, on the right, as described above.
There is stable decreased right renal mass and decreased blood flow to the right
kidney, compared to the left.

## 2006-12-29 ENCOUNTER — Inpatient Hospital Stay (HOSPITAL_COMMUNITY): Admission: AD | Admit: 2006-12-29 | Discharge: 2007-01-03 | Payer: Self-pay | Admitting: Urology

## 2006-12-29 ENCOUNTER — Ambulatory Visit: Payer: Self-pay | Admitting: Cardiology

## 2006-12-29 IMAGING — CT CT ABDOMEN WO/W CM
2 of 5 series · 11 of 32 positions shown, 17 images · IV contrast (omnipaque)
Comparison: [DATE]

ABDOMEN CT WITHOUT AND WITH CONTRAST:

Addendum BeginsOriginal report by Dr. DEHH.  Following addendum by Dr. DEHH on [DATE]: 

 I discussed the results of this exam with Dr. DEHH by telephone at approximately [U5] hours on [DATE].
 Addendum Ends
CLINICAL DATA: Right flank cellulitis. History of right UPJ repair.
TECHNIQUE: Multidetector CT imaging of the abdomen was performed following the
standard protocol before and during bolus administration of intravenous
contrast.
Contrast: 125 cc Omnipaque 300

[Series 3: abd_pel 5.0 b40f st · axial · 0.82mm/px · z∈[-372,-12]mm · 9 of 91 slices shown, 15 images (1 of 2)]
[im 10/91  soft-tissue]
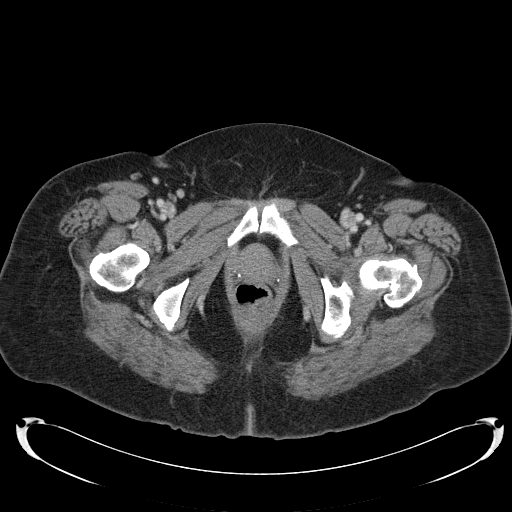
[im 10/91  bone]
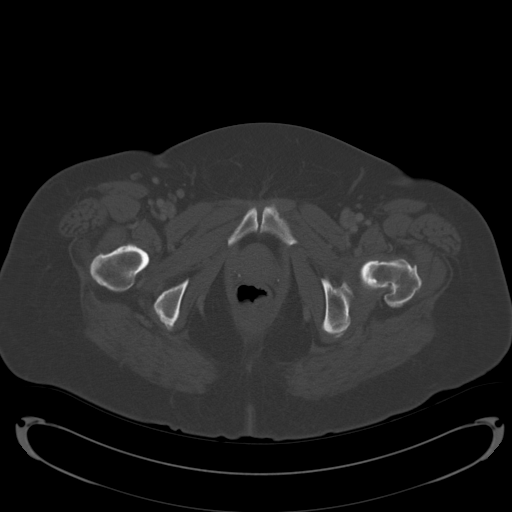
[im 19/91  soft-tissue]
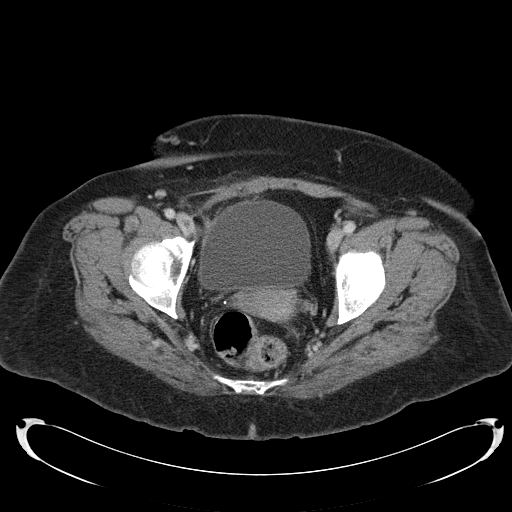
[im 28/91  soft-tissue]
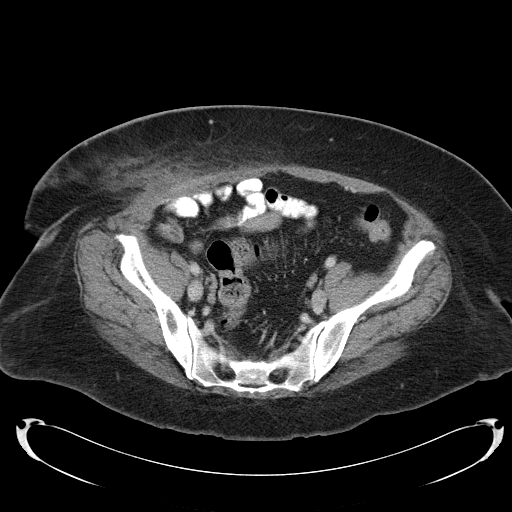
[im 37/91  soft-tissue]
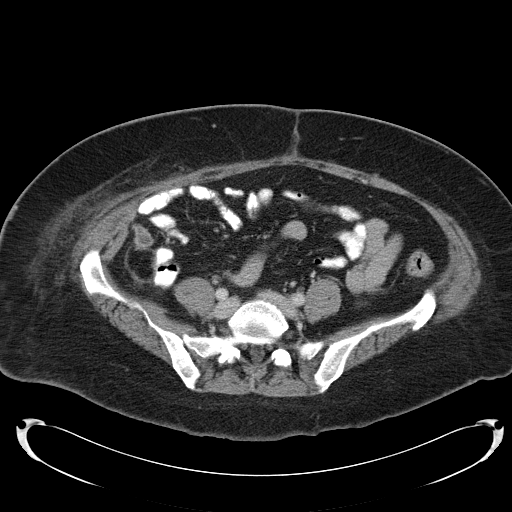
[im 46/91  soft-tissue]
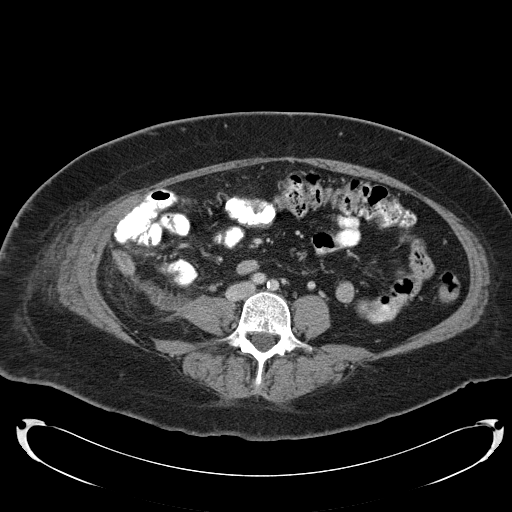
[im 55/91  soft-tissue]
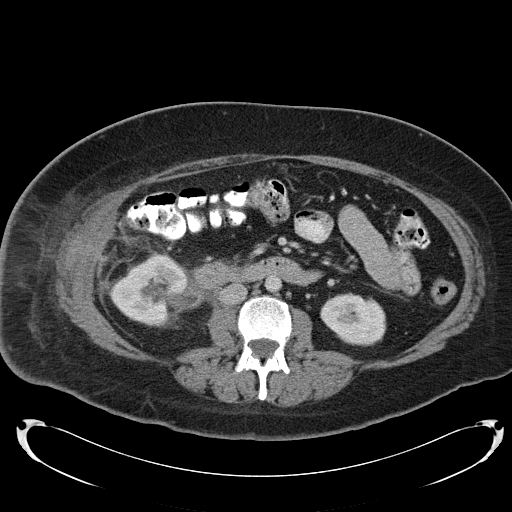
[im 55/91  lung]
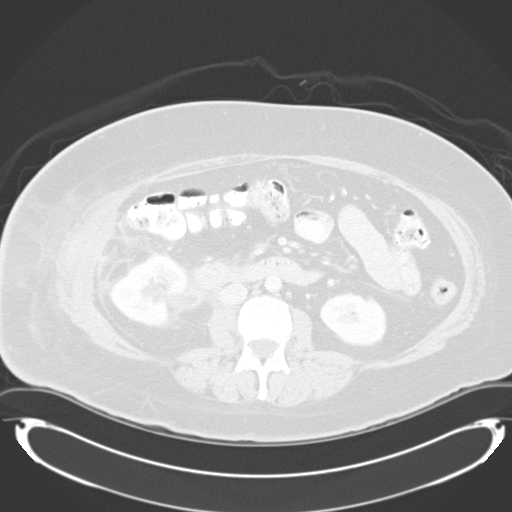
[im 64/91  soft-tissue]
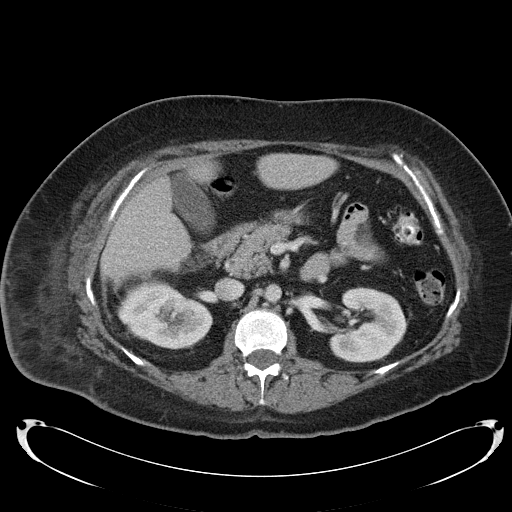
[im 64/91  lung]
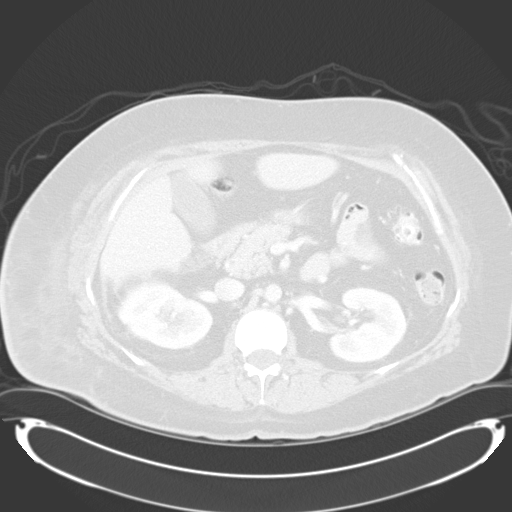
[im 73/91  soft-tissue]
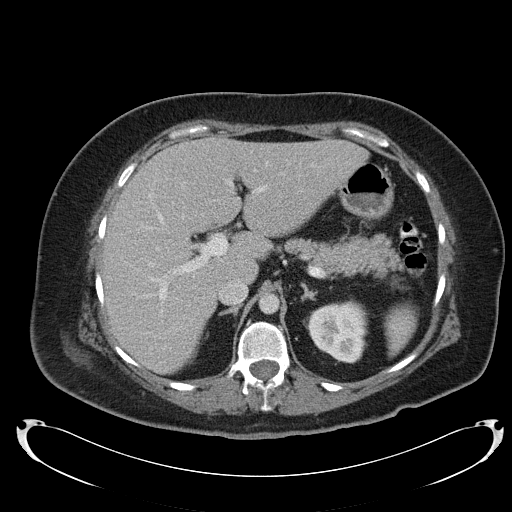
[im 73/91  lung]
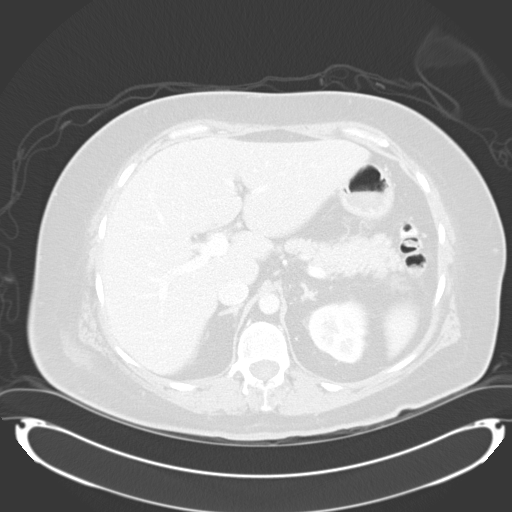
[im 82/91  soft-tissue]
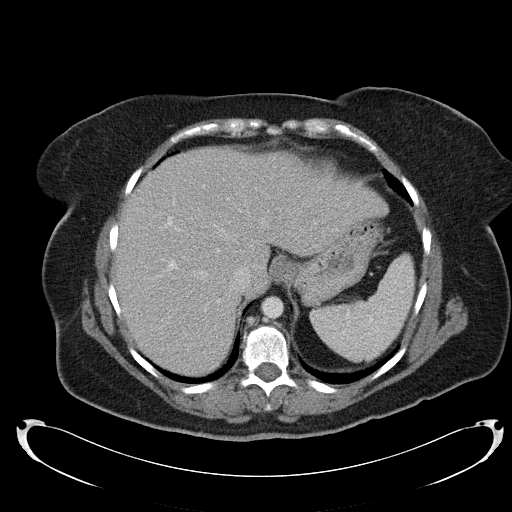
[im 82/91  lung]
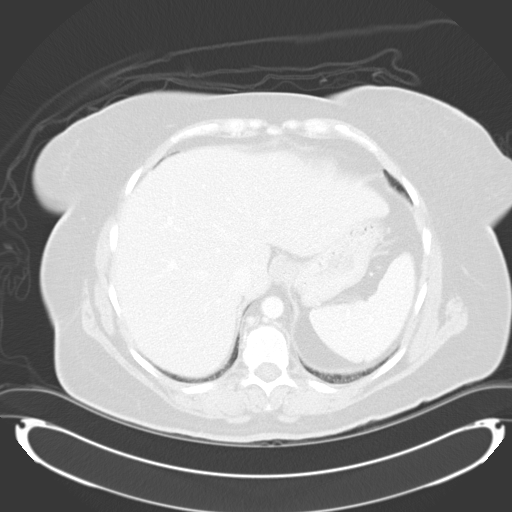
[im 82/91  bone]
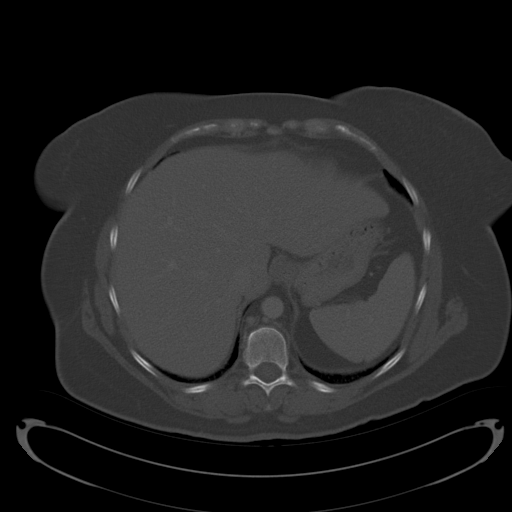

[Series 7: abd_pel 5.0 b40f st · axial · 0.89mm/px · z∈[-372,-327]mm · 2 of 91 slices shown (2 of 2)]
[im 10/91  soft-tissue]
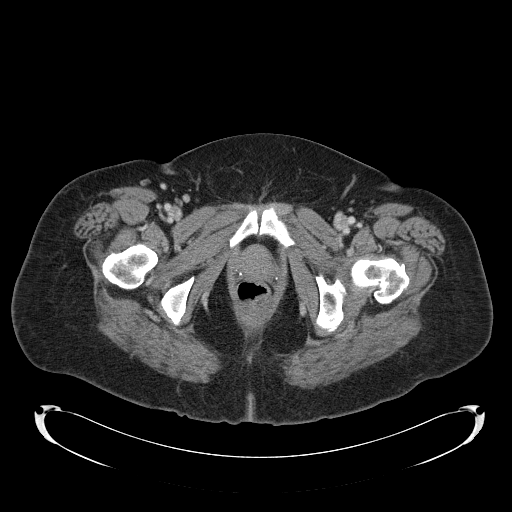
[im 19/91  soft-tissue]
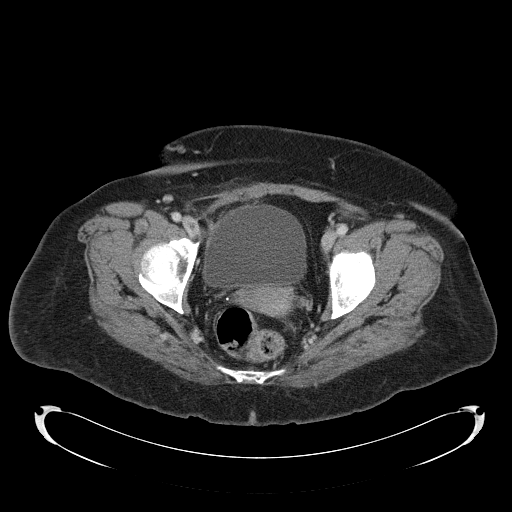

[11 of 32 positions shown; findings below may reference images not displayed]

FINDINGS: Precontrast imaging demonstrates no stones in either kidney. Surgical
clips are seen in the region of the right UPJ.

After contrast administration, the spleen has normal imaging features. A tiny 5
mm low-density focus in the lateral segment of the left liver is stable, and
likely reflects a tiny cyst or benign biliary hamartoma. The stomach is
decompressed. Duodenum, pancreas, gallbladder, adrenal glands, and left kidney
are unremarkable.

The right double J stent and retroperitoneal drain have been removed in the
interval. 2.4 cm well-defined fluid density lesion in the interpolar right
kidney is stable and remains compatible with a cyst. Renal delay sequences show
opacification of the right renal pelvis with flow of opacified urine into the
ileal conduit although the distal portion of the conduit has not been visualized
on the delay sequences. There is some stranding in the retroperitoneal tissues
on the right, but there is no focal or a rim-enhancing fluid collection suggest
the presence of a urinoma or retroperitoneal abscess. Lateral wall musculature
on the right is edematous an ill-defined, consistent with the recent surgery and
presence of the recent surgical drain in this region. The subcutaneous tissues
over the right flank also demonstrate diffuse edema and there is some overlying
skin thickening. Within the subcutaneous fat of the right flank, between the
lateral wall musculature in the skin, is a 3.7 x 3.2 x 2.3 cm ill-defined fluid
collection.
IMPRESSION: Edema within the right lateral wall musculature, subcutaneous fat, and overlying
skin. These imaging features are compatible with cellulitis. Within the
subcutaneous fat is an approximately 3 cm poorly defined fluid collection.
Subcutaneous seroma could have this appearance and the collection does not
demonstrate well-defined margins or an enhancing rim. However superinfection of
this fluid cannot be completely excluded and early abscess could have this
appearance.

Interval removal of the JJ stent and surgical retroperitoneal drain. There is no
evidence for contrast extravasation at the proximal anastomosis of the
ileoconduit. 

PELVIS CT WITH CONTRAST:
FINDINGS: No evidence for intraperitoneal free fluid. There is no pelvic
lymphadenopathy. No adnexal masses. Fibroid changes noted in the uterus. Bladder
is mildly distended. Low attenuation fluid is seen in the distal ileal conduit
with no evidence for focal fluid collection near the distal anastomosis. Edema
within the subcutaneous fat of the lower right anterior abdominal wall is
presumably related to an incision site.

A filling defect in the right common femoral vein is consistent with venous
thrombosis. Thrombus extends into the superficial femoral vein but appears
nonocclusive on the provided images.
IMPRESSION: Thrombus within the right common femoral vein and superficial femoral vein.

## 2006-12-31 ENCOUNTER — Encounter: Payer: Self-pay | Admitting: Cardiology

## 2006-12-31 IMAGING — CR DG CHEST 2V
2 series · 2 of 2 positions shown · non-contrast
Comparison: [DATE].

CLINICAL DATA: Right flank cellulitis. 
 CHEST - 2 VIEW:

[view not recorded (1 of 2)]
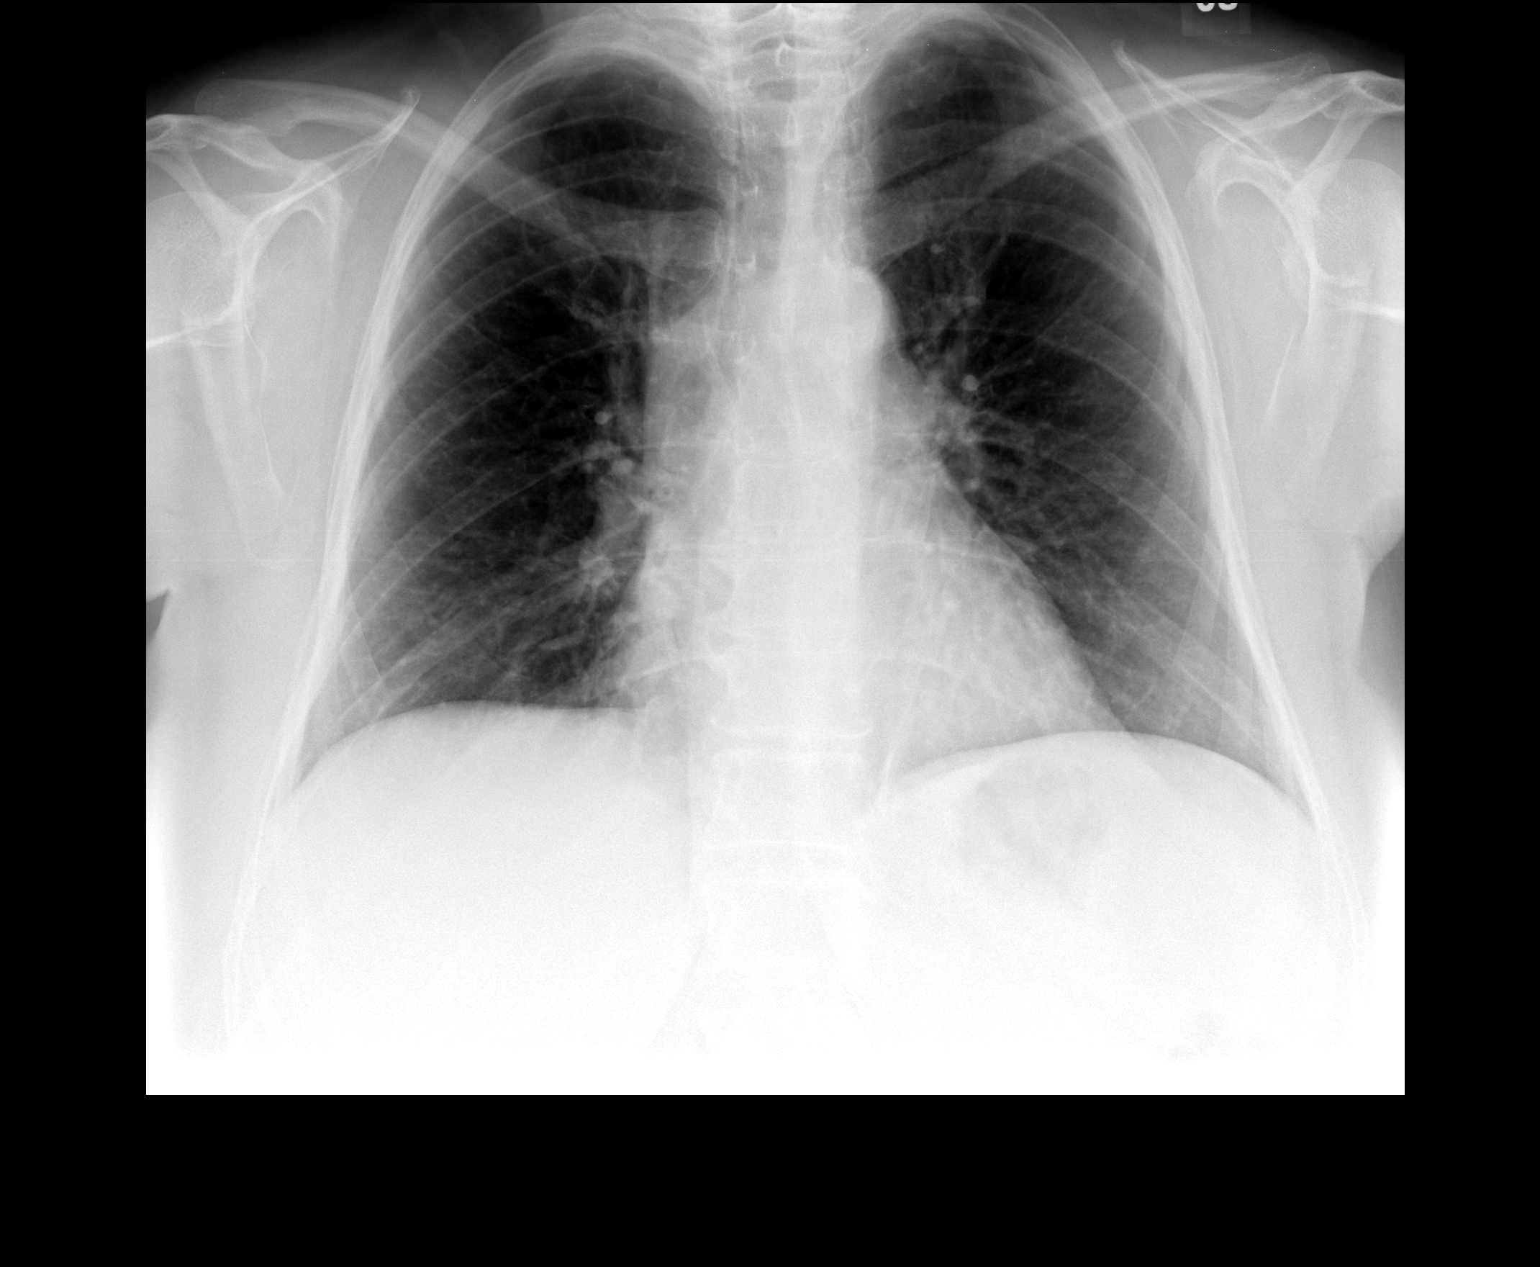

[view not recorded (2 of 2)]
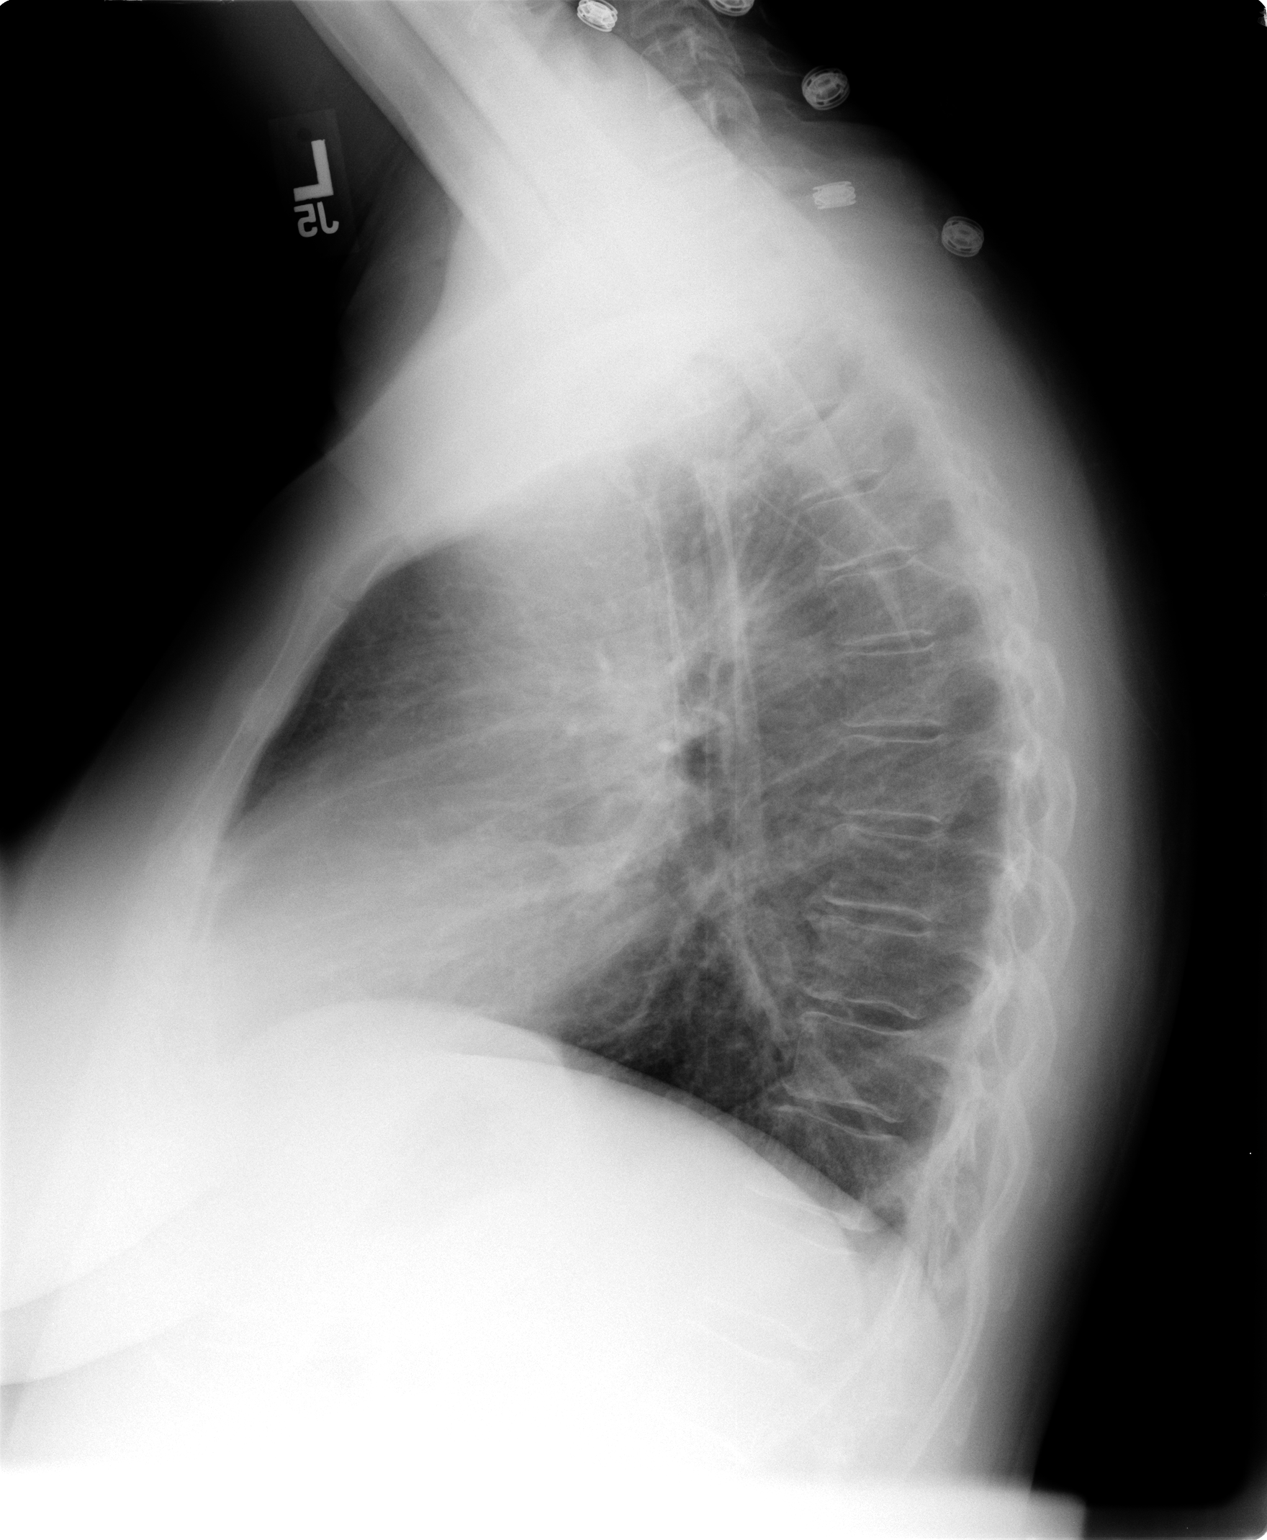

[2 of 2 positions shown; findings below may reference images not displayed]

FINDINGS: The lungs are clear and the heart and mediastinal strictures are normal.  There mild chronic bronchitic changes.
IMPRESSION: Mild chronic bronchitic changes.  No evidence for active chest disease radiographically.

## 2006-12-31 IMAGING — NM NM PULM PERFUSION & VENT (REBREATHING & WASHOUT)
3 series · 18 of 18 positions shown · non-contrast
Comparison: Chest radiograph dated [DATE].

CLINICAL DATA: Rule out pulmonary embolus.
 NUCLEAR MEDICINE VENTILATION - PERFUSION LUNG SCAN:
TECHNIQUE: Wash-in, equilibrium, and wash-out phase ventilation images were obtained using [ZK] gas.  Perfusion images were obtained in multiple projections after intravenous injection of [ZK] MAA.
 Radiopharmaceutical:  11.1 mCi [ZK] gas and 4.7 mCi [ZK] MAA.

[vq ventlung · 4.75mm/px · 6 of 8 frames shown (1 of 3)]
[frame 1/8]
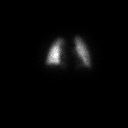
[frame 2/8]
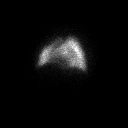
[frame 4/8]
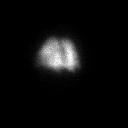
[frame 5/8]
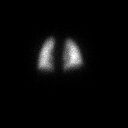
[frame 6/8]
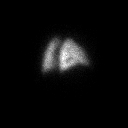
[frame 8/8]
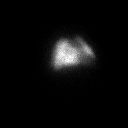

[vq ventlung · 4.75mm/px · 6 of 17 frames shown (2 of 3)]
[frame 2/17  full-range]
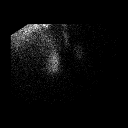
[frame 4/17]
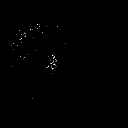
[frame 7/17]
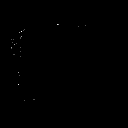
[frame 10/17]
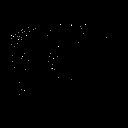
[frame 13/17]
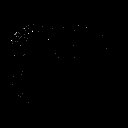
[frame 16/17]
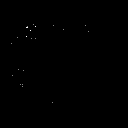

[vq ventlung · 4.75mm/px · 6 of 17 frames shown (3 of 3)]
[frame 2/17  full-range]
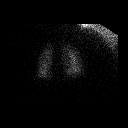
[frame 4/17]
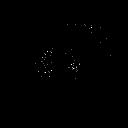
[frame 7/17]
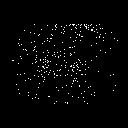
[frame 10/17]
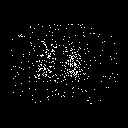
[frame 13/17]
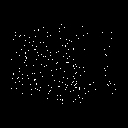
[frame 16/17]
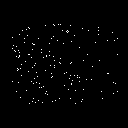

[18 of 18 positions shown; findings below may reference images not displayed]

FINDINGS: On the perfusion images, there is uniform distribution of the radiotracer of both lungs.  There is no segmental diffusion defect.
IMPRESSION: Very low probability for pulmonary embolus.

## 2007-01-19 ENCOUNTER — Inpatient Hospital Stay (HOSPITAL_COMMUNITY): Admission: EM | Admit: 2007-01-19 | Discharge: 2007-01-22 | Payer: Self-pay | Admitting: Urology

## 2007-01-20 IMAGING — CR DG ABDOMEN ACUTE W/ 1V CHEST
4 series · 4 of 4 positions shown · non-contrast
Comparison: Portable abdominal exam of [DATE].

CLINICAL DATA: Nausea and vomiting 
 ACUTE ABDOMEN WITH CHEST ? 1 VIEW:

[view not recorded (1 of 4)]
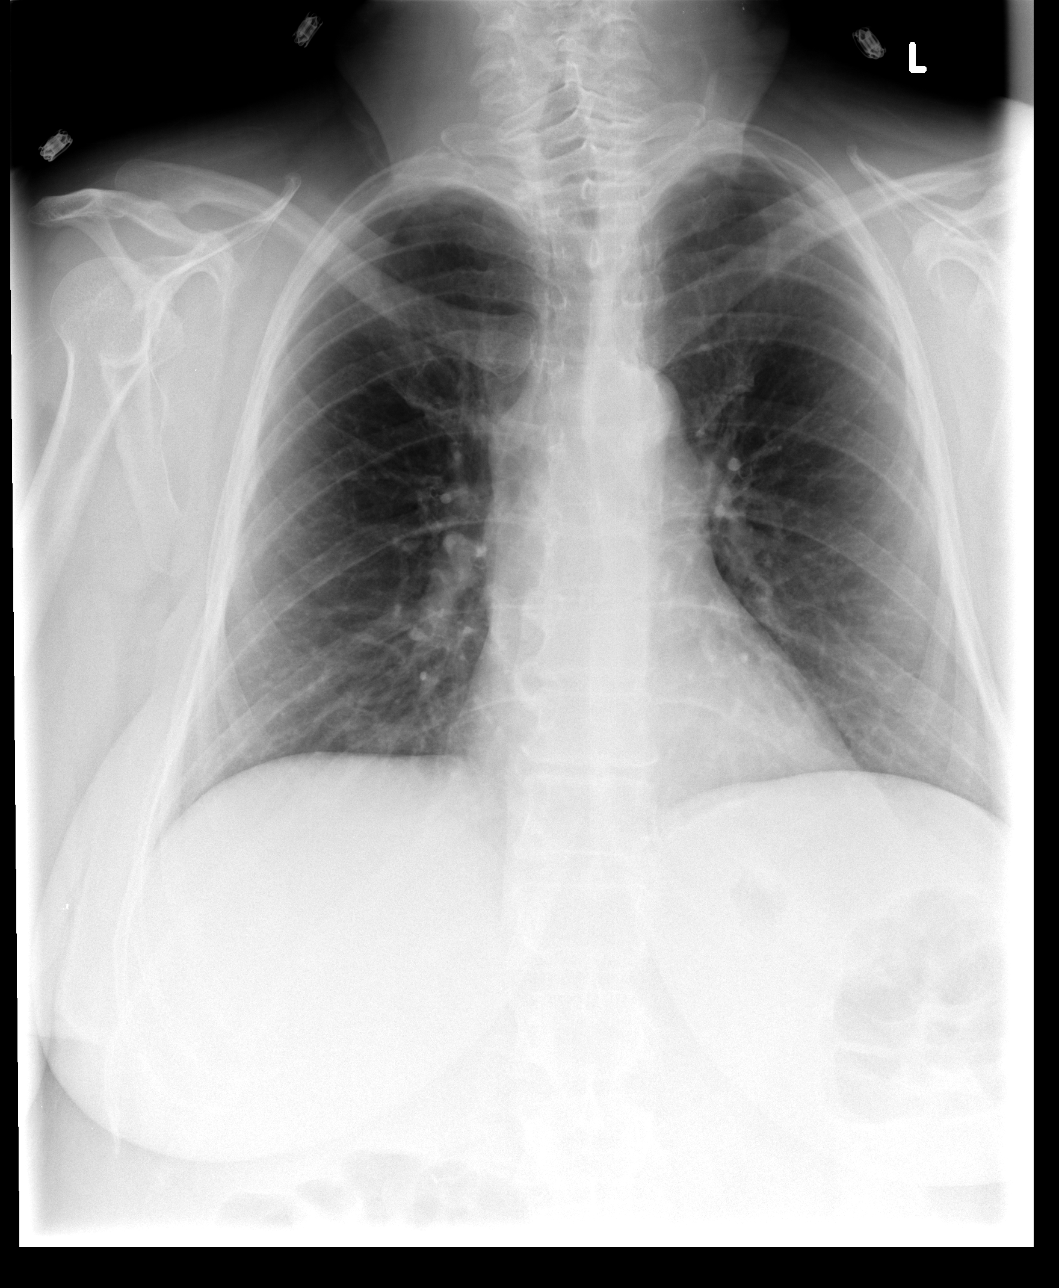

[view not recorded (2 of 4)]
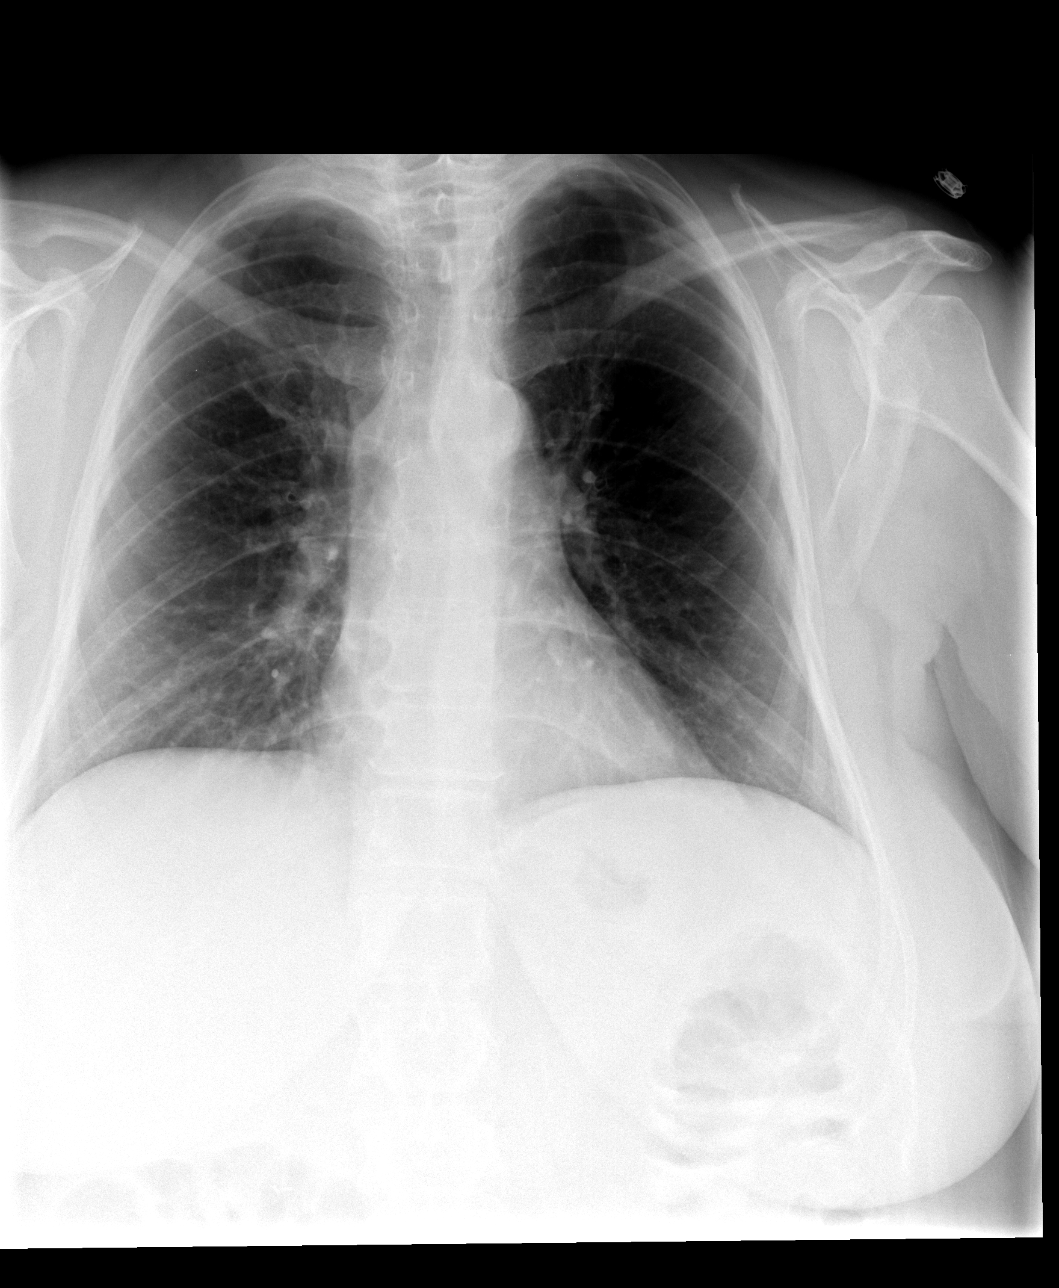

[view not recorded (3 of 4)]
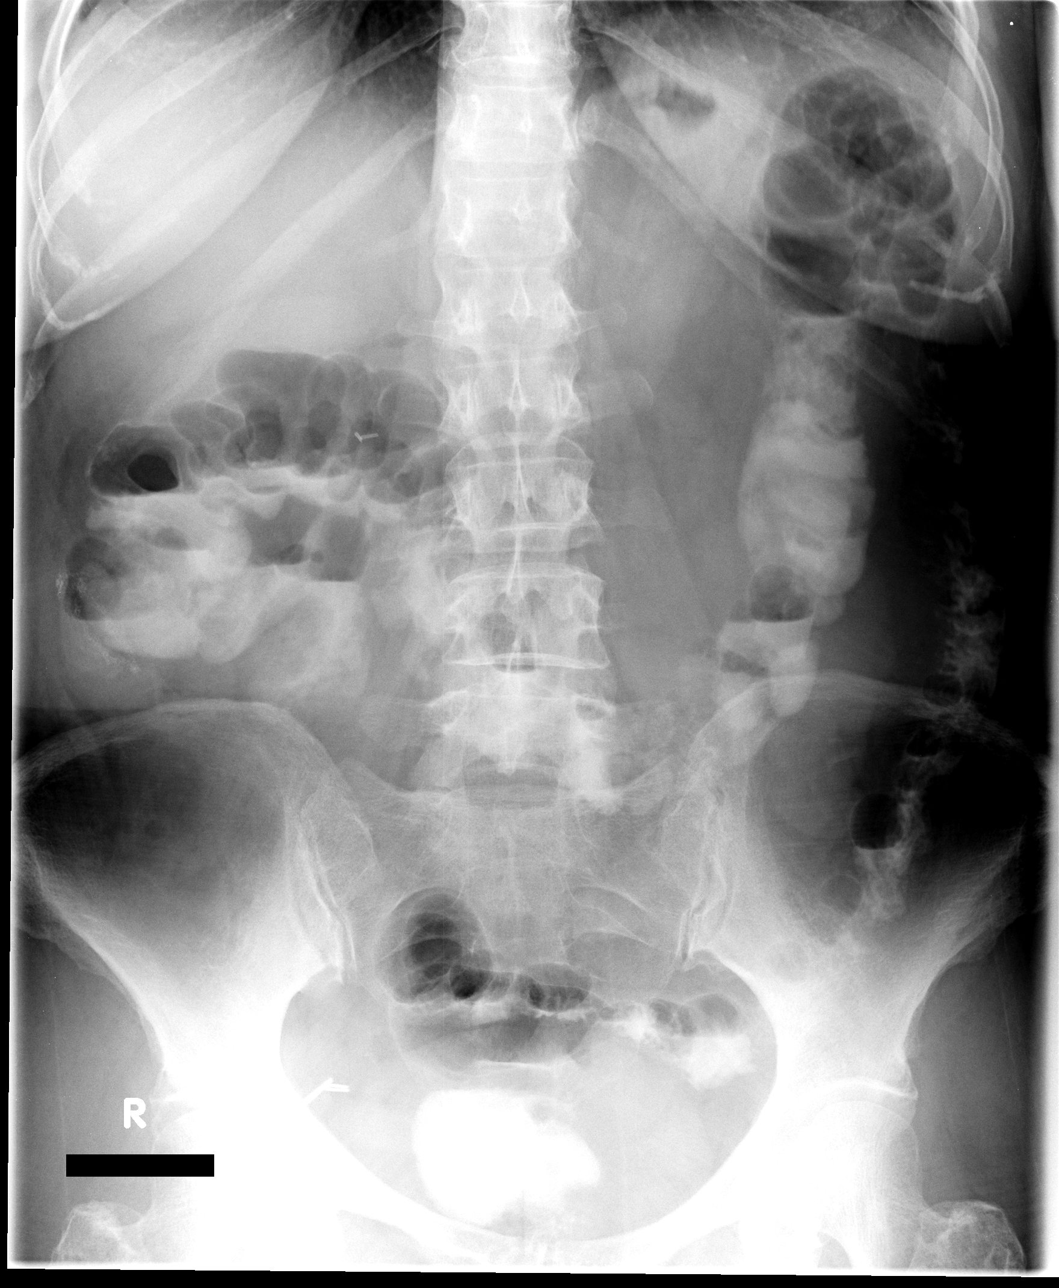

[view not recorded (4 of 4)]
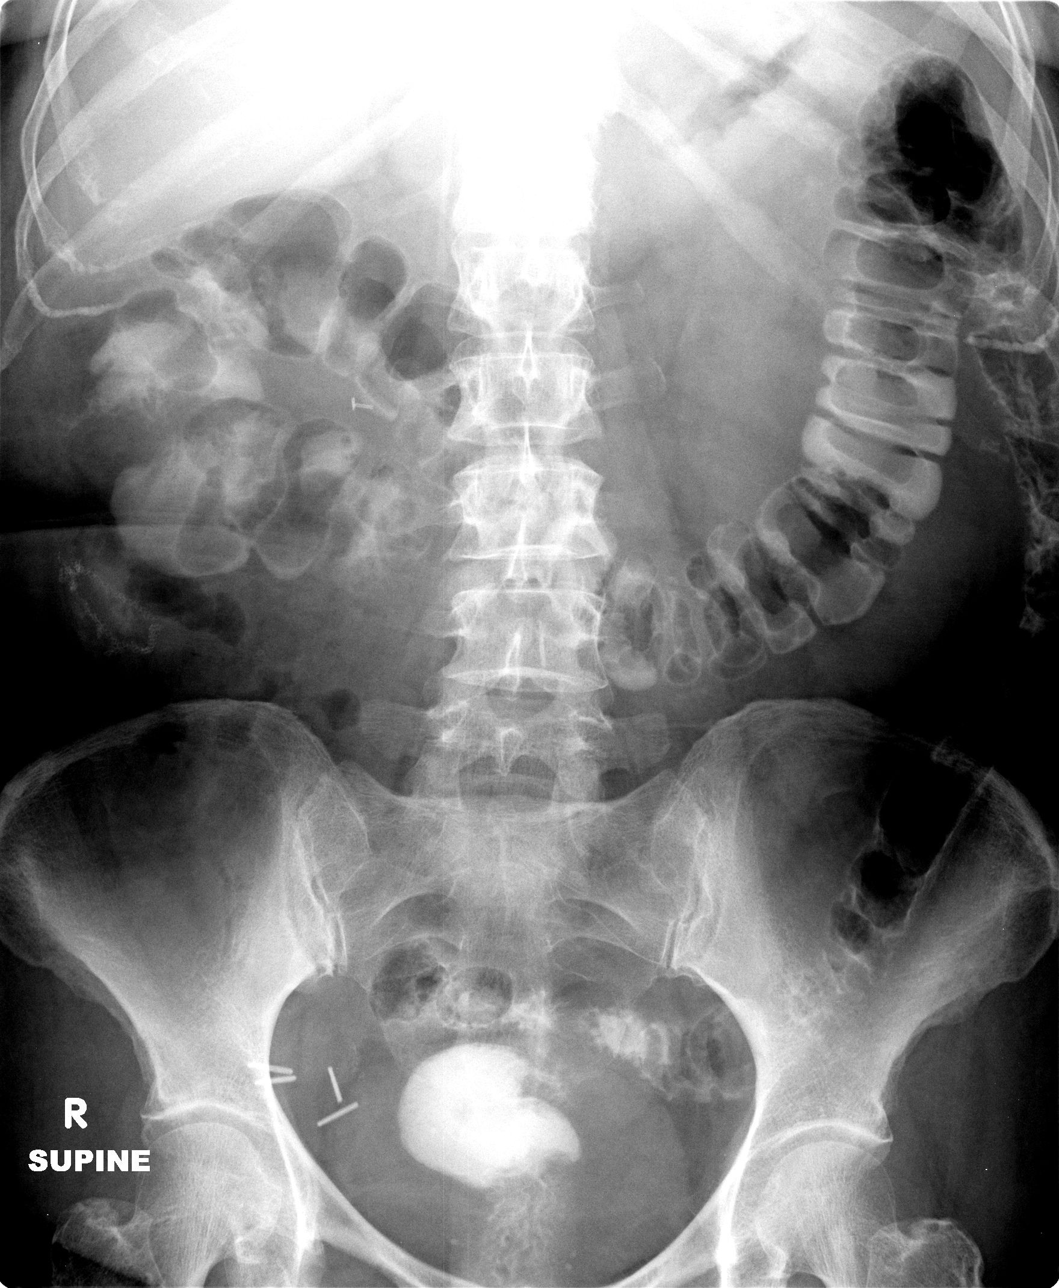

[4 of 4 positions shown; findings below may reference images not displayed]

FINDINGS: There is no active cardiopulmonary disease.  There is no free air.  There is Gastrografin throughout the colon.  There is no large or small bowel dilatation.   A few scattered short segment air fluid levels are noted in the colon on the erect view.  There are some postoperative bowel sutures in the right colon.  There is no small bowel dilatation.
IMPRESSION: 1.  No active cardiopulmonary disease.  
 2.  No acute or specific abdominal findings ? there is Gastrografin in a normal caliber colon.

## 2007-01-22 IMAGING — NM NM RENAL IMAGING FLOW W/ PHARM
2 series · 12 of 12 positions shown · non-contrast
Comparison: Prior nuclear medicine studies performed [DATE] and [DATE].  Renal ultrasound done yesterday is also correlated.

CLINICAL DATA: Nausea and vomiting.  Renal insufficiency.  History of right ureteral ileal conduit for UPJ stenosis. 
 NUCLEAR MEDICINE RENAL SCAN WITH DIURETIC ADMINISTRATION:
TECHNIQUE: Radionuclide angiographic and sequential renal images were obtained after intravenous injection of radiopharmaceutical.  Imaging was continued during slow intravenous injection of Lasix approximately 15 minutes after the start of the examination. 
 Radiopharmaceutical:  14.5 mCi [DE] MAG3 and 40 mg intravenous Lasix.

[Series 1: re renal qualitative · 9.51mm/px · 6 of 90 frames shown (1 of 2)]
[frame 8/90]
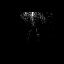
[frame 23/90]
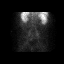
[frame 38/90]
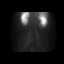
[frame 53/90]
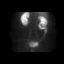
[frame 68/90]
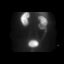
[frame 83/90]
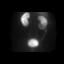

[Series 1: re renal qualitative · 9.51mm/px · 6 of 90 frames shown (2 of 2)]
[frame 8/90]
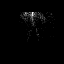
[frame 23/90]
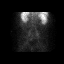
[frame 38/90]
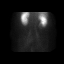
[frame 53/90]
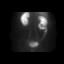
[frame 68/90]
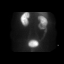
[frame 83/90]
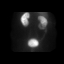

[12 of 12 positions shown; findings below may reference images not displayed]

FINDINGS: The perfusion images demonstrate a good aortic bolus and symmetric perfusion to both kidneys.  The differential renal mass is currently 47% left and 53% right.  This does represent a change from the prior examinations which demonstrated greater renal mass on the left.  
 The renogram images demonstrate persistent renal activity, especially on the left.  The right ileal conduit is visualized, and there is progressive activity in the urinary bladder.  I believe there is faint visualization of the left ureter.  The renogram curves demonstrate a gradually rising curve on the left without response to Lasix.  The right renogram curve is gently downsloping, but without response to Lasix.
IMPRESSION: The current study demonstrates persistent renal parenchymal activity, especially on the left, suggesting interval development of renal tubular dysfunction, probably acute tubular necrosis.  In this setting, evaluation of ureteral patency is limited, but the right ileal conduit is visualized, and there is progressive urinary bladder activity confirming patency of the conduit.  The renal flow and differential renal mass curves are now more symmetric.

## 2007-02-02 ENCOUNTER — Ambulatory Visit (HOSPITAL_COMMUNITY): Admission: RE | Admit: 2007-02-02 | Discharge: 2007-02-02 | Payer: Self-pay | Admitting: Urology

## 2007-03-16 ENCOUNTER — Ambulatory Visit: Payer: Self-pay | Admitting: "Endocrinology

## 2007-06-30 ENCOUNTER — Ambulatory Visit: Payer: Self-pay | Admitting: Gastroenterology

## 2007-07-08 ENCOUNTER — Ambulatory Visit: Payer: Self-pay | Admitting: "Endocrinology

## 2007-07-14 ENCOUNTER — Ambulatory Visit: Payer: Self-pay | Admitting: Gastroenterology

## 2007-07-14 ENCOUNTER — Encounter: Payer: Self-pay | Admitting: Gastroenterology

## 2007-07-16 ENCOUNTER — Ambulatory Visit (HOSPITAL_COMMUNITY): Admission: RE | Admit: 2007-07-16 | Discharge: 2007-07-16 | Payer: Self-pay | Admitting: Gastroenterology

## 2007-07-28 ENCOUNTER — Ambulatory Visit: Payer: Self-pay | Admitting: Gastroenterology

## 2007-09-07 ENCOUNTER — Ambulatory Visit: Payer: Self-pay | Admitting: Gastroenterology

## 2007-10-21 ENCOUNTER — Ambulatory Visit: Payer: Self-pay | Admitting: "Endocrinology

## 2007-10-23 ENCOUNTER — Encounter: Admission: RE | Admit: 2007-10-23 | Discharge: 2007-10-23 | Payer: Self-pay | Admitting: Internal Medicine

## 2007-10-23 IMAGING — MG MM SCREEN MAMMOGRAM BILATERAL
4 series · 4 of 4 positions shown · non-contrast
Comparison: none

DG SCREEN MAMMOGRAM BILATERAL
Bilateral CC and MLO view(s) were taken.

DIGITAL SCREENING MAMMOGRAM WITH CAD:
There are scattered fibroglandular densities.  No masses or malignant type calcifications are 
identified.  Compared with prior studies.

[R CC]
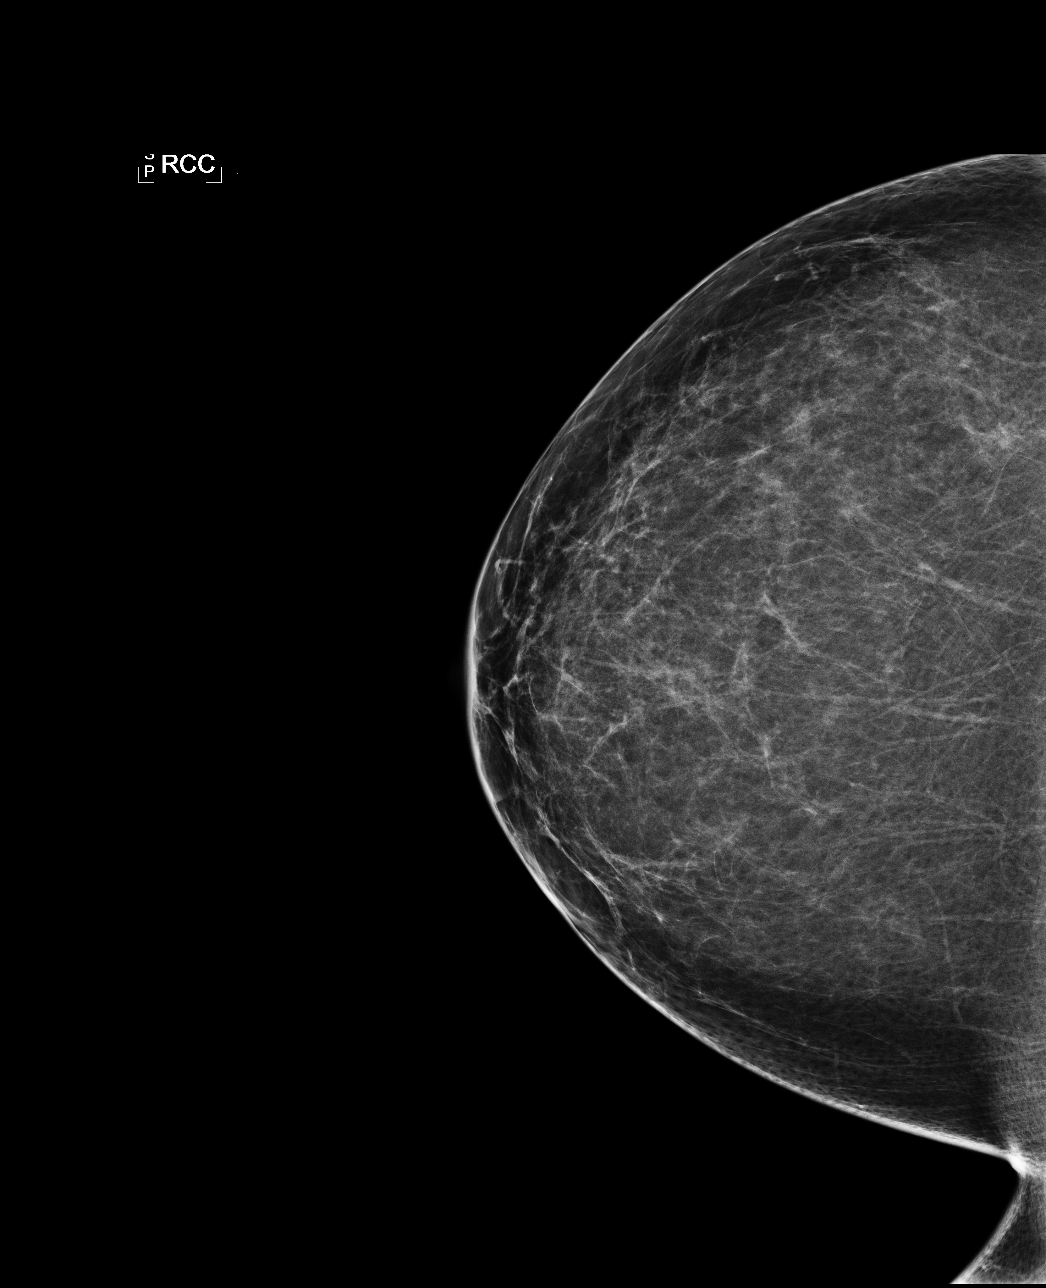

[L CC]
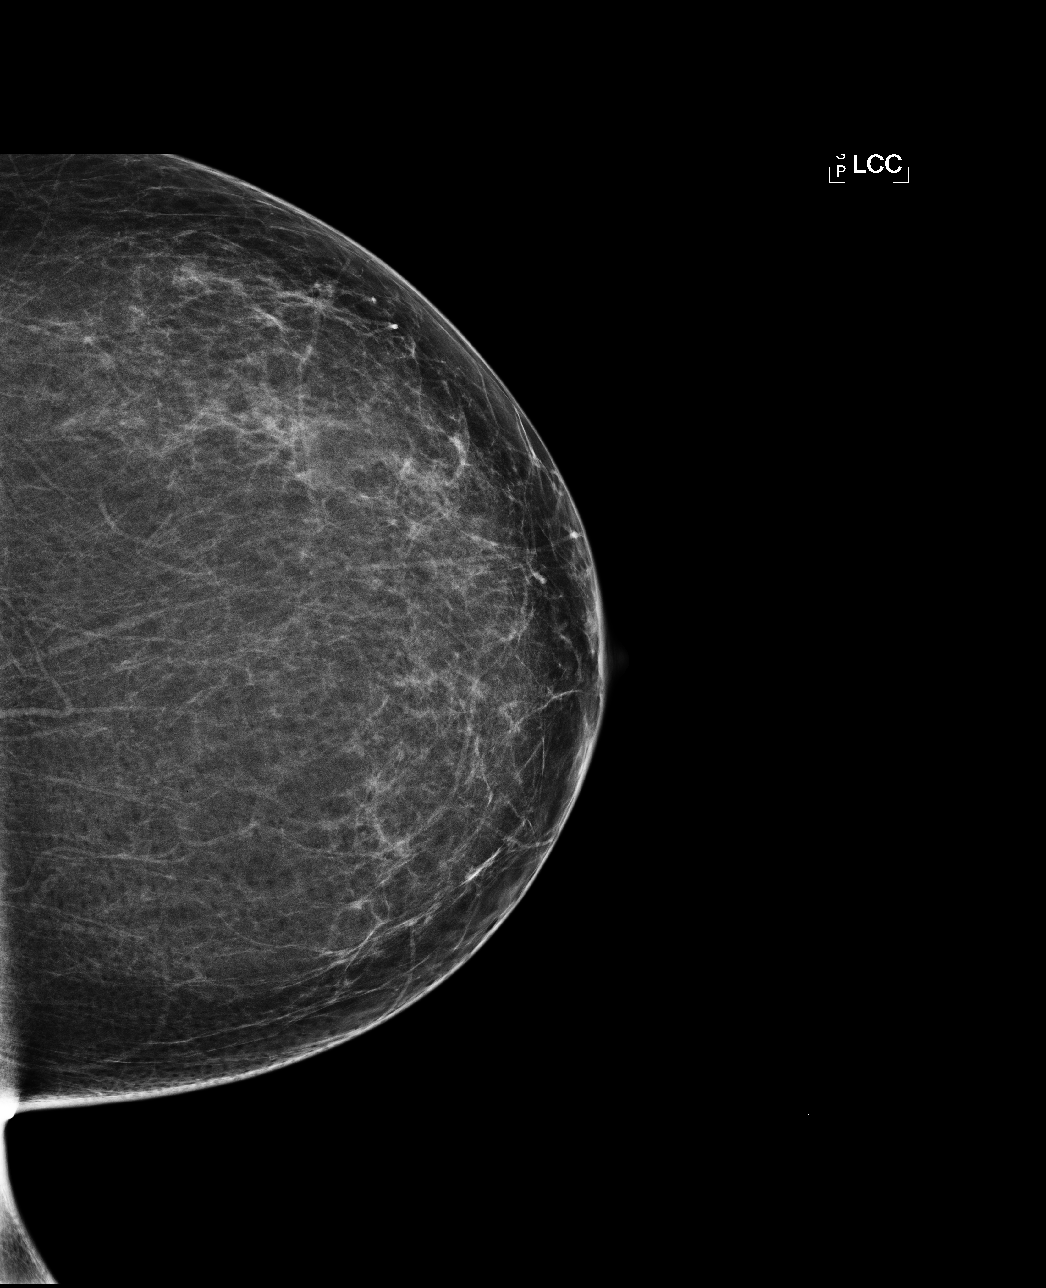

[L MLO]
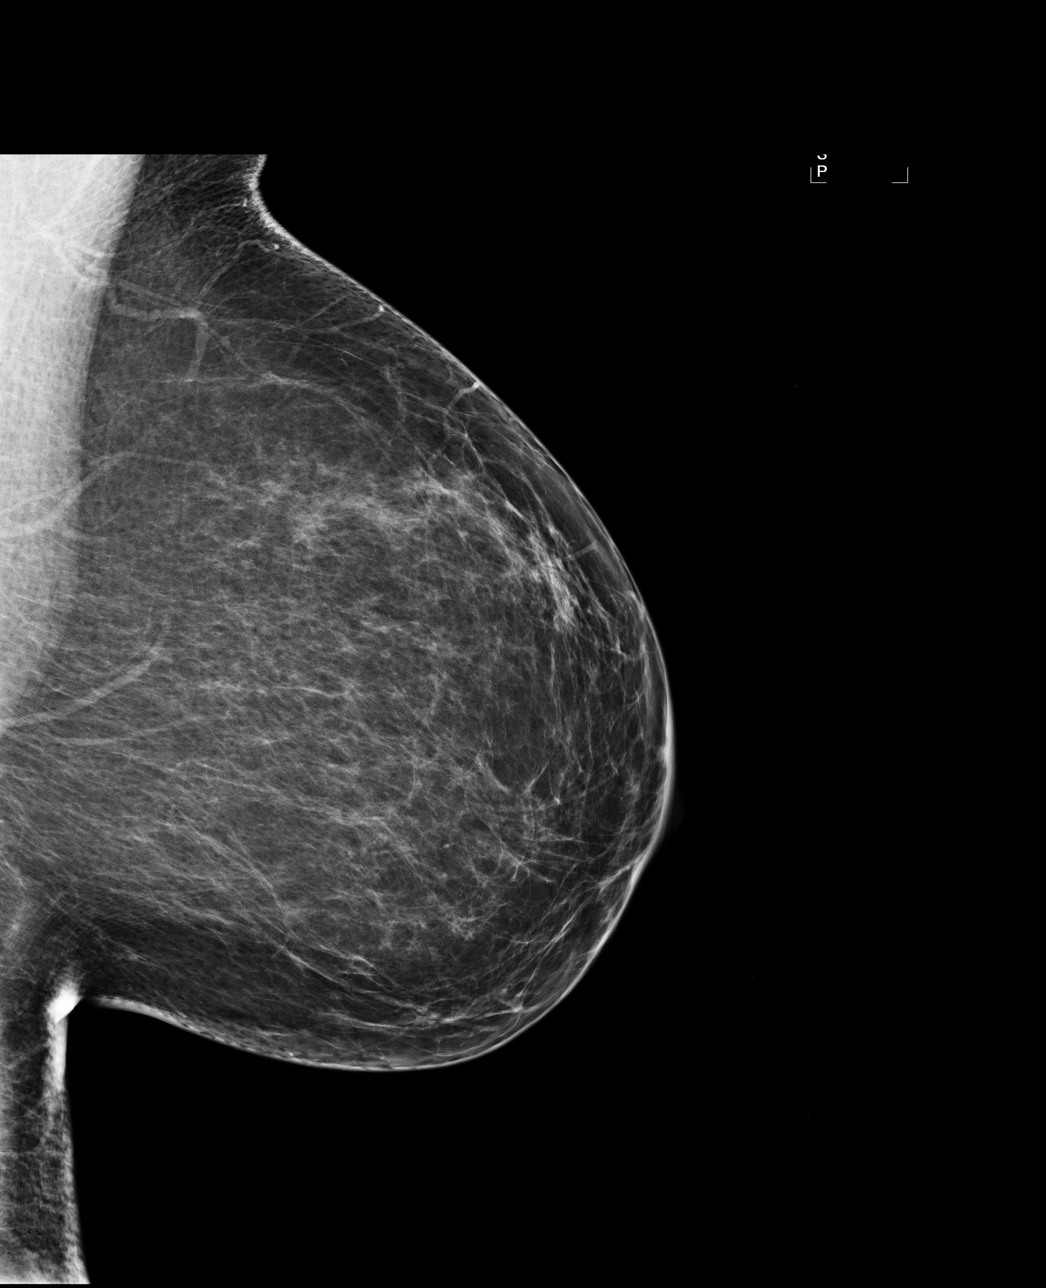

[R MLO]
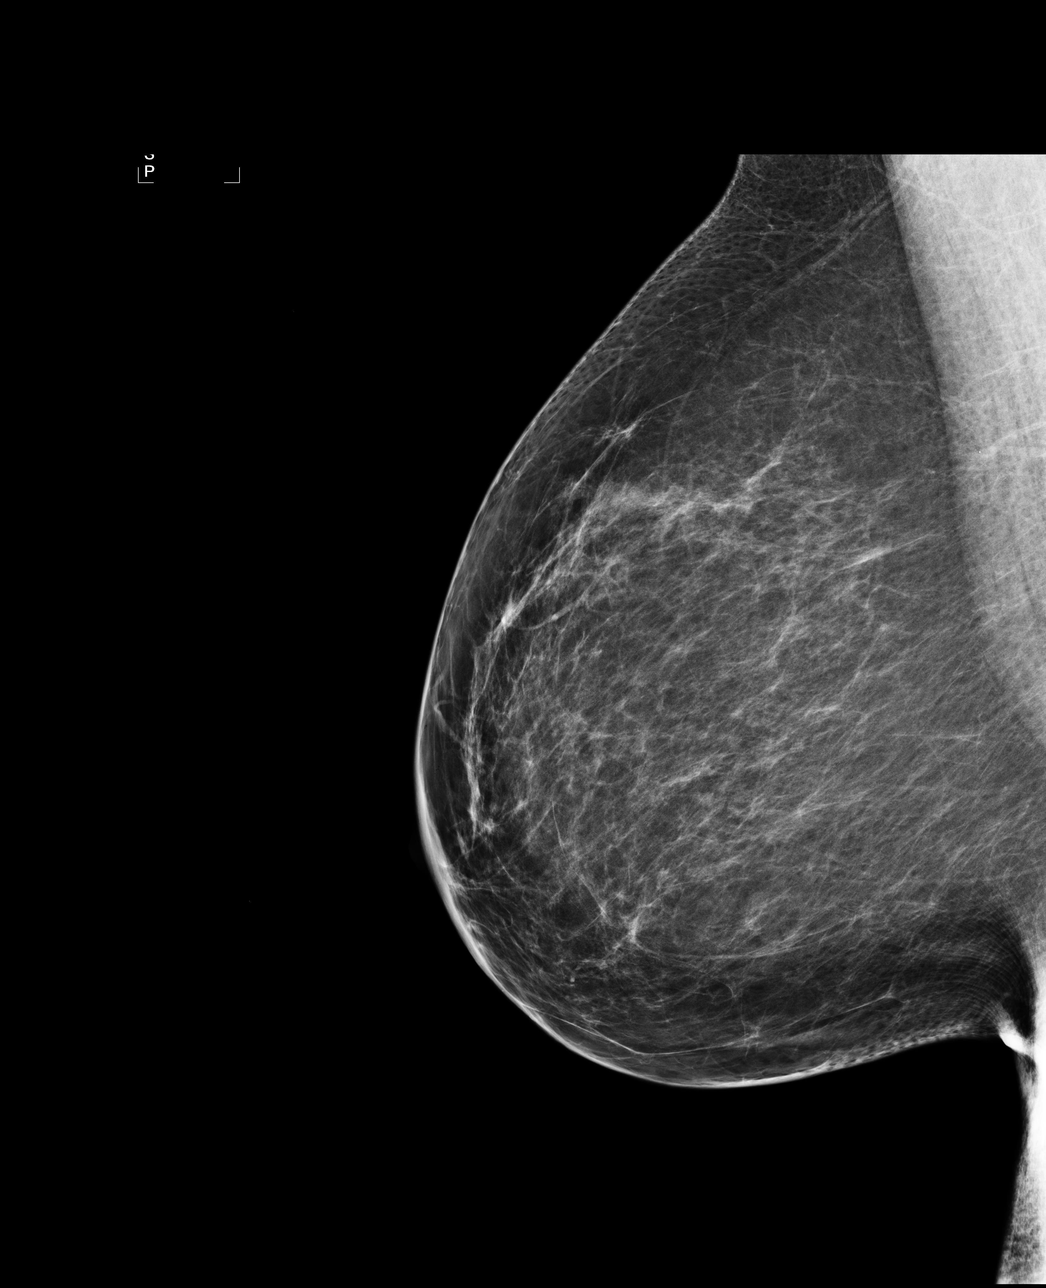

[4 of 4 positions shown; findings below may reference images not displayed]

IMPRESSION: No specific mammographic evidence of malignancy.  Next screening mammogram is recommended in one 
year.

ASSESSMENT: Negative - BI-RADS 1

Screening mammogram in 1 year.
ANALYZED BY COMPUTER AIDED DETECTION. , THIS PROCEDURE WAS A DIGITAL MAMMOGRAM.

## 2007-11-06 ENCOUNTER — Ambulatory Visit (HOSPITAL_COMMUNITY): Admission: RE | Admit: 2007-11-06 | Discharge: 2007-11-06 | Payer: Self-pay | Admitting: Urology

## 2007-11-06 IMAGING — NM NM RENAL IMAGING FLOW W/ PHARM
2 series · 12 of 12 positions shown · non-contrast
Comparison: Renal dynamic scan [DATE].

CLINICAL DATA: Patient with ureteral obstruction.  Prior right UPJ pyeloplasty and right ileoconduit.
NUCLEAR MEDICINE RENAL SCAN WITH DIURETIC ADMINISTRATION:
TECHNIQUE: Radionuclide angiographic and sequential renal images were obtained after intravenous injection of radiopharmaceutical.  Imaging was continued during slow intravenous injection of Lasix approximately 20 minutes after the start of the examination. 
Radiopharmaceutical:  14.7 mCi [LT] MAG3 and 40mg of Lasix.

[Series 1: re renal qualitative · 9.51mm/px · 6 of 130 frames shown (1 of 2)]
[frame 11/130]
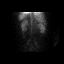
[frame 33/130]
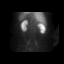
[frame 55/130]
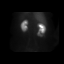
[frame 76/130]
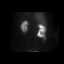
[frame 98/130]
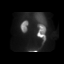
[frame 120/130]
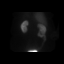

[Series 1: re renal qualitative · 9.51mm/px · 6 of 130 frames shown (2 of 2)]
[frame 11/130]
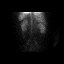
[frame 33/130]
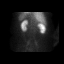
[frame 55/130]
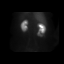
[frame 76/130]
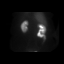
[frame 98/130]
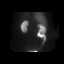
[frame 120/130]
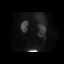

[12 of 12 positions shown; findings below may reference images not displayed]

FINDINGS: Flow:  There is prompt symmetric perfusion to the left and right kidney.
Renogram:  Left kidney:  There is prompt cortical uptake within the left kidney which is renoform in shape.  There is poor excretion of counts from the left renal cortex into the collecting system.  There is faint clearance of count from the collecting system through the left ureter.  On the postvoid images there is significant retained counts within the left renal parenchyma similar to prior.
Right kidney:  There is prompt cortical uptake of radiotracer.  There is excretion of counts into the collecting system which clear over time into the ileoconduit.  There are retained counts within the right renal parenchyma on the postvoid images to a lesser extent than the left kidney.  
  Administration of Lasix augments the clearance of counts from the right renal collecting system and does not significant alter the left kidney for clearance.  
Differential renal function:  Left kidney = 46.4% (prior 47%) and right kidney =  53.6% (prior 53%).
IMPRESSION: 1.  Persistant left renal cortical dysfunction with little urine production.  
2.  Slight improved  clearance from the right kidney through the ileoconduit compared to prior. No obstruction.  Mild cortical dysfuntion.
3.  Differential renal function is similar to prior as described above.

## 2008-03-22 DIAGNOSIS — Z8639 Personal history of other endocrine, nutritional and metabolic disease: Secondary | ICD-10-CM | POA: Insufficient documentation

## 2008-03-22 DIAGNOSIS — I10 Essential (primary) hypertension: Secondary | ICD-10-CM | POA: Insufficient documentation

## 2008-03-22 DIAGNOSIS — E119 Type 2 diabetes mellitus without complications: Secondary | ICD-10-CM | POA: Insufficient documentation

## 2008-03-22 DIAGNOSIS — Z862 Personal history of diseases of the blood and blood-forming organs and certain disorders involving the immune mechanism: Secondary | ICD-10-CM | POA: Insufficient documentation

## 2008-04-11 ENCOUNTER — Ambulatory Visit: Payer: Self-pay | Admitting: "Endocrinology

## 2008-06-21 ENCOUNTER — Ambulatory Visit (HOSPITAL_COMMUNITY): Admission: RE | Admit: 2008-06-21 | Discharge: 2008-06-21 | Payer: Self-pay | Admitting: Urology

## 2008-06-21 IMAGING — NM NM RENAL IMAGING FLOW W/ PHARM
1 series · 6 of 6 positions shown · non-contrast
Comparison: renal scan [DATE]

CLINICAL DATA: Right ureteral obstruction with a prior right UPJ
pyloroplasty and right ileal conduit.

NUCLEAR MEDICINE RENAL SCINTIANGIOGRAPHY WITH FLOW AND FUNCTION AND
PHARMACOLOGIC AUGMENTATION
TECHNIQUE: Radionuclide angiographic and sequential renal images
were obtained after intravenous injection of radiopharmaceutical.
Imaging was continued during slow intravenous injection of Lasix
approximately 20-30 minutes after the start of the examination.
Radiopharmaceutical: 15.0 mCi technetium Mag III

[Series 1: qr qualitative renal · 3.22mm/px · 6 of 104 frames shown]
[frame 9/104]
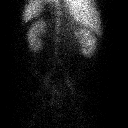
[frame 26/104]
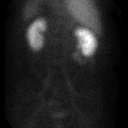
[frame 44/104]
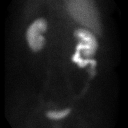
[frame 61/104]
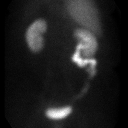
[frame 78/104]
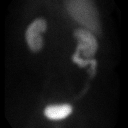
[frame 96/104]
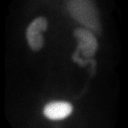

[6 of 6 positions shown; findings below may reference images not displayed]

FINDINGS: Flow:  Are relatively prompt symmetric flow to both left and right
kidney.

Renogram:

Left kidney: There is prompt cortical uptake within the left kidney
which is reniform in shape.  There is limited excretion of counts
into the collecting system over course the study.  Limited
clearance of counts from the collecting system.  Lasix does not
augment clearance from left collecting system.  There is
significant residual counts within the renal cortex on the postvoid
images.

Right Kidney:  There is prompt cortical uptake of radiotracer.
There is a relatively prompt excretion into the right renal
collecting system.  There is spontaneous partial clearance of
counts in the collecting system prior to Lasix administration.
Counts are noted within the ileal conduit.  There is incomplete
clearance of counts from the right renal cortex with a significant
residual counts within the right renal cortex.  Lasix does not
augment clearance of counts from the cortex or collecting system on
the right.

Differential:

Left kidney =  42.4% (prior 46.4%).

Right Kidney =  57.6% (prior 53.6%).

T1/2 post Lasix:

Left kidney = 115 minutes

Right kidney = 39 minutes
IMPRESSION: 1..  Interval increase in cortical retention within the right
kidney compared to prior consistent with partial obstruction.
2..  Significant cortical retention in the left kidney consist with
renal dysfunction.
3.  Differential renal function as above.

## 2008-06-23 ENCOUNTER — Ambulatory Visit (HOSPITAL_COMMUNITY): Admission: RE | Admit: 2008-06-23 | Discharge: 2008-06-23 | Payer: Self-pay | Admitting: Urology

## 2008-06-23 IMAGING — CR DG CHEST 2V
2 series · 2 of 2 positions shown · non-contrast
Comparison: [DATE]

CLINICAL DATA: Preop for or cystoscopy

CHEST - 2 VIEW

[w chest pa]
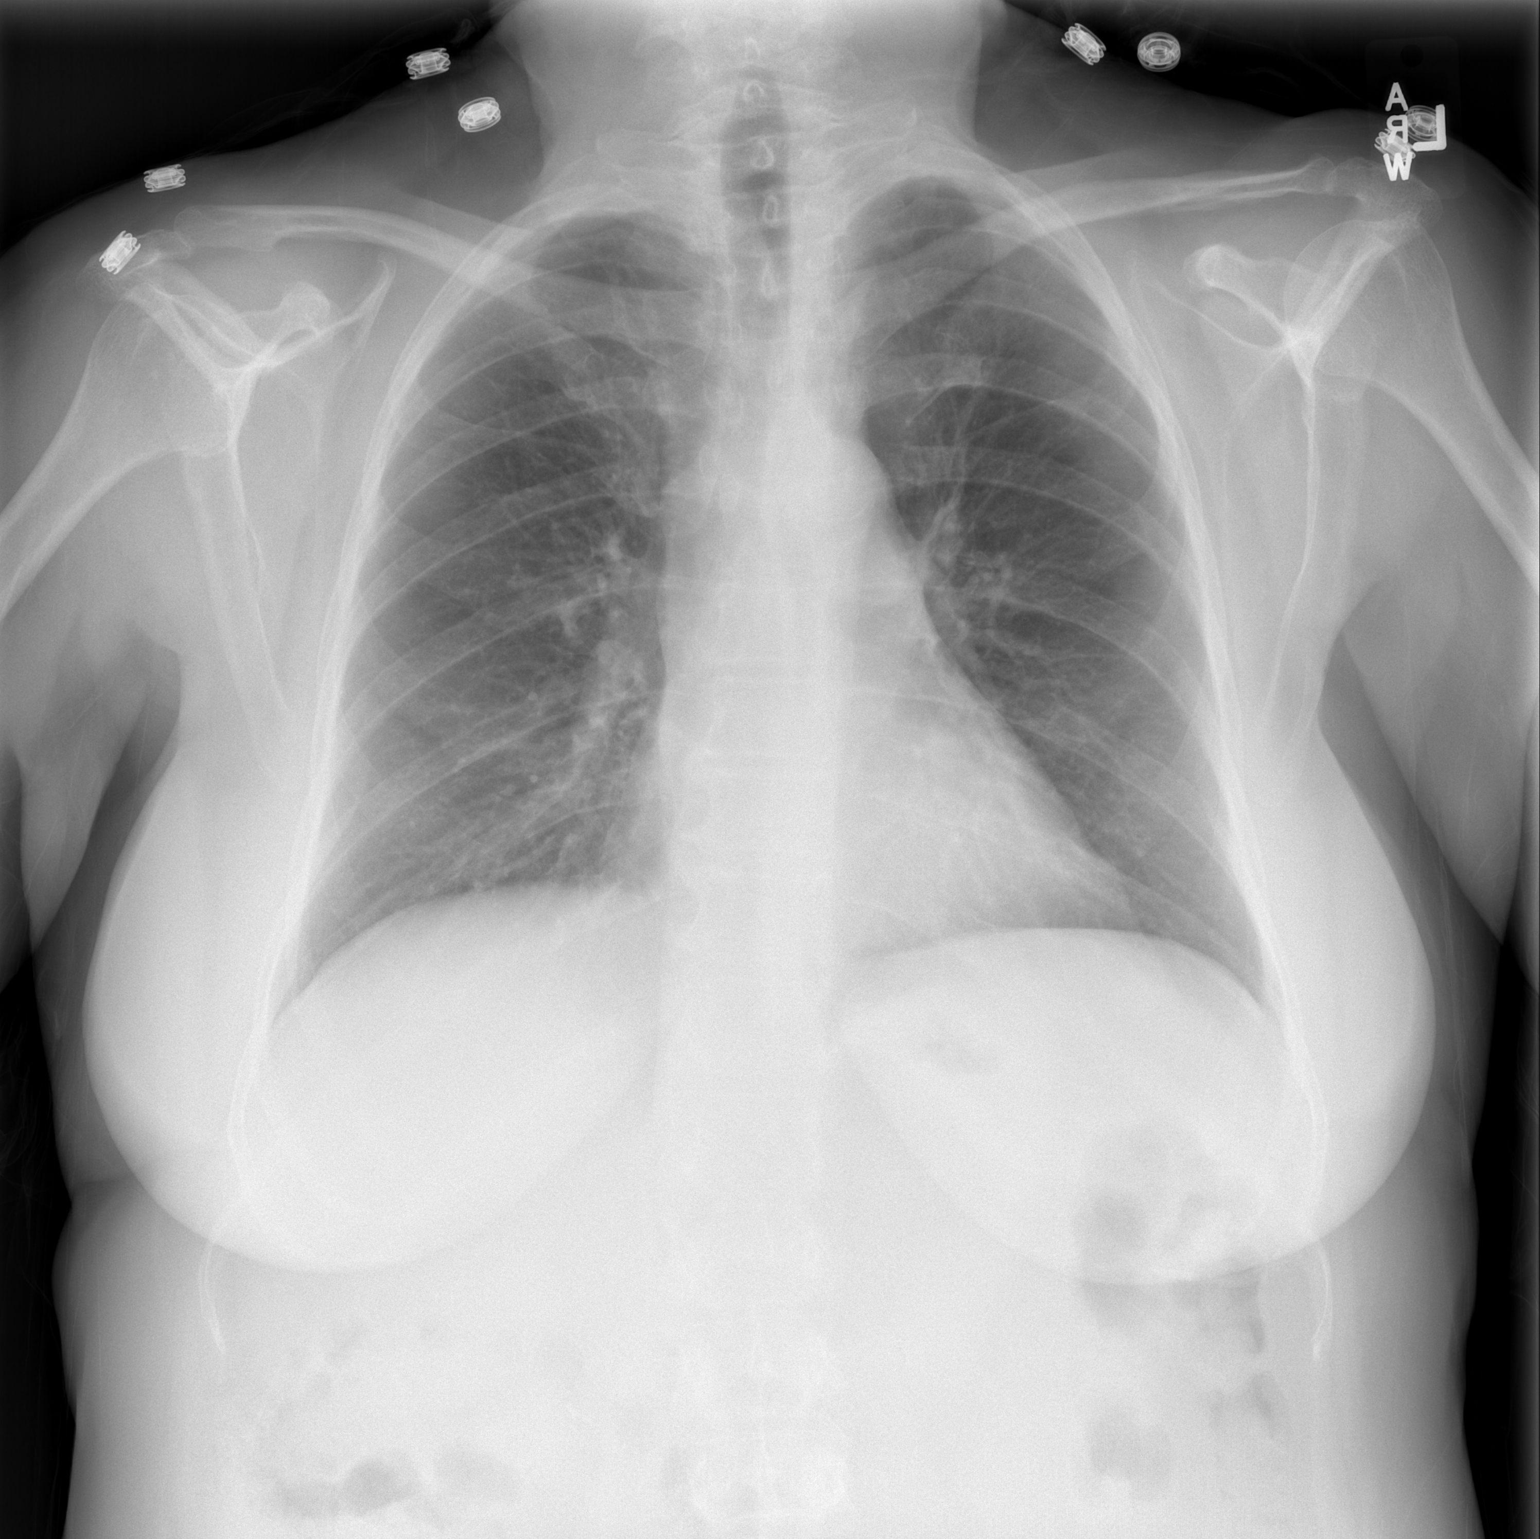

[w chest lat]
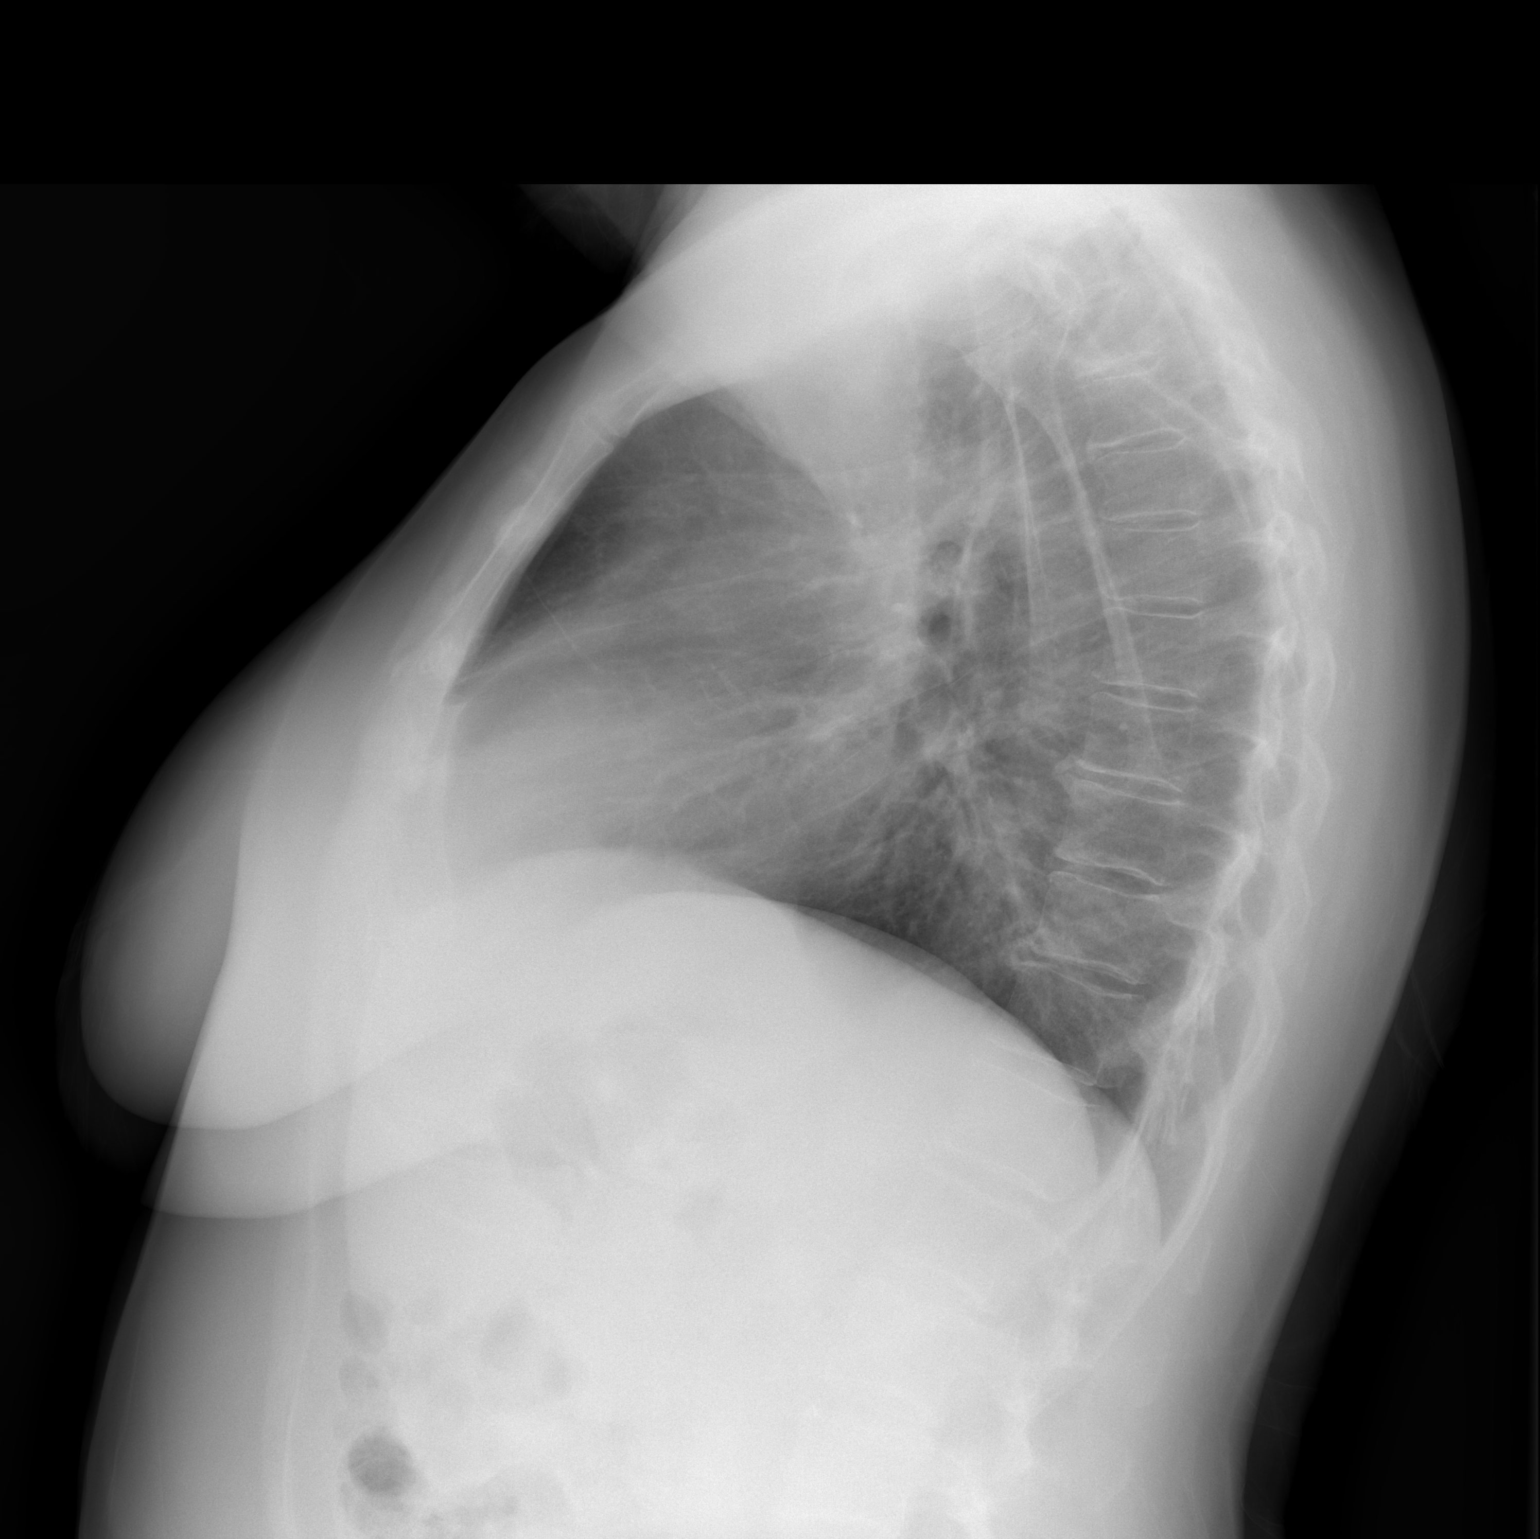

[2 of 2 positions shown; findings below may reference images not displayed]

FINDINGS: Heart and mediastinal contours normal.  Mild
peribronchial thickening without active airspace disease or pleural
fluid.  Osseous structures intact.
IMPRESSION: Mild peribronchial thickening - no active disease.

## 2008-07-15 IMAGING — CT CT PELVIS W/O CM
1 of 4 series · 14 of 32 positions shown, 19 images · non-contrast
Comparison: [DATE]

CT ABDOMEN

CLINICAL DATA: Evaluate for kidney failure.

CT ABDOMEN AND PELVIS WITHOUT CONTRAST
TECHNIQUE: Multidetector CT imaging of the abdomen and pelvis was
performed following the standard
protocol without intravenous contrast.

[Series 4: recon 3: <(id) stone a/p >12m · axial · 0.85mm/px · z∈[-370,+36]mm · 14 of 369 slices shown, 19 images]
[im 22/369  soft-tissue]
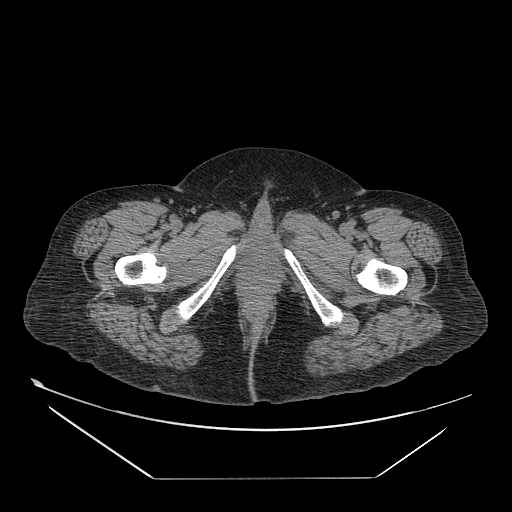
[im 22/369  bone]
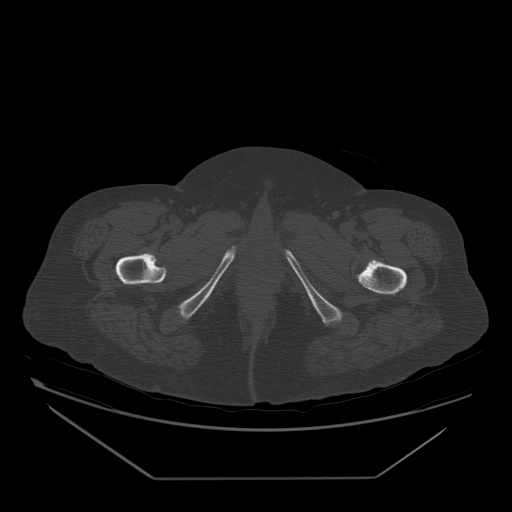
[im 44/369  soft-tissue]
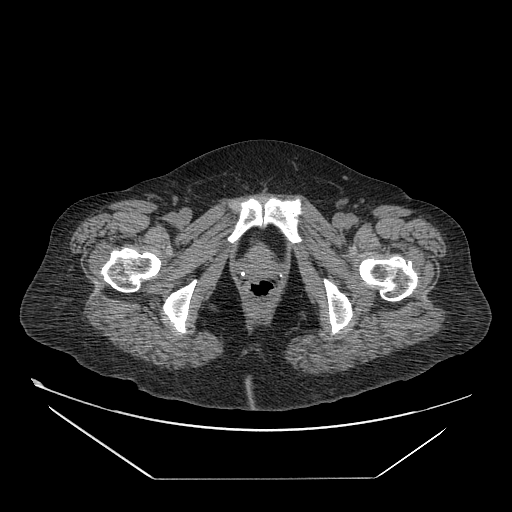
[im 87/369  soft-tissue]
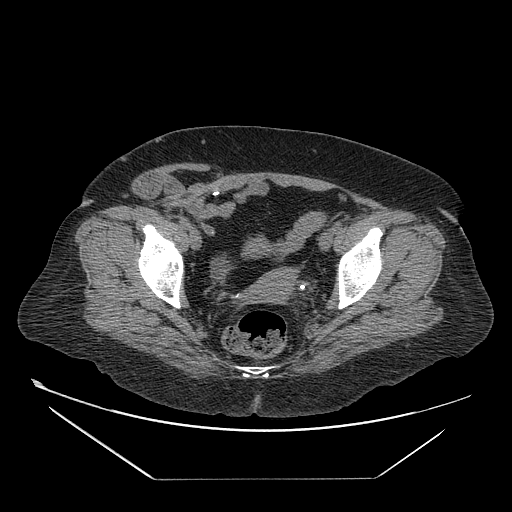
[im 109/369  soft-tissue]
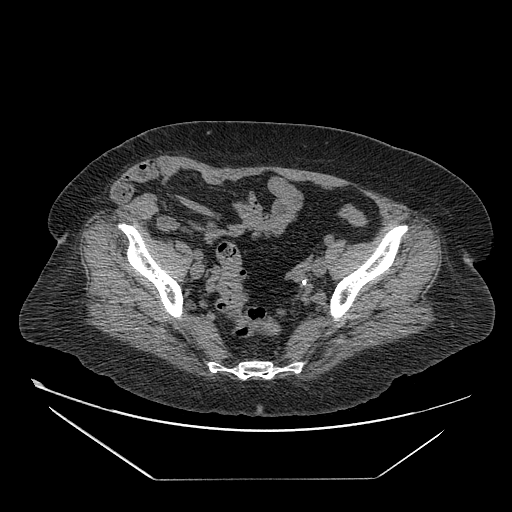
[im 130/369  soft-tissue]
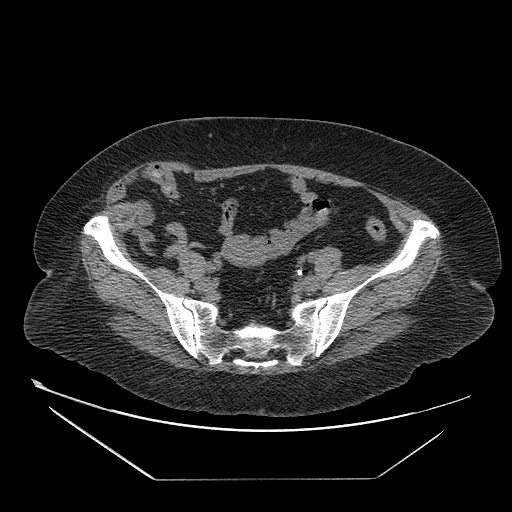
[im 152/369  soft-tissue]
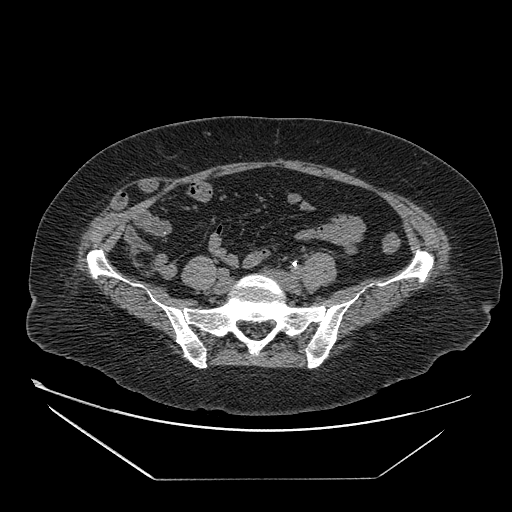
[im 195/369  soft-tissue]
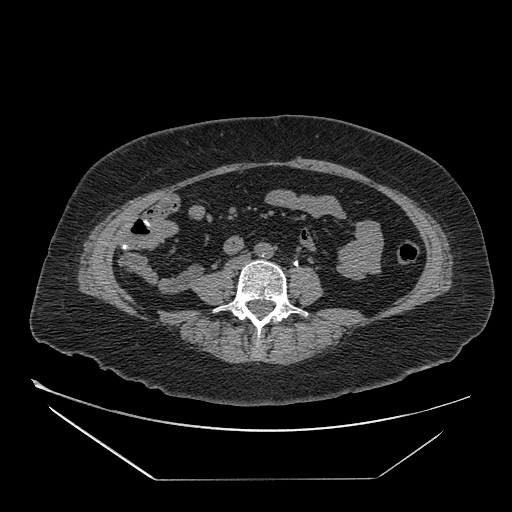
[im 217/369  soft-tissue]
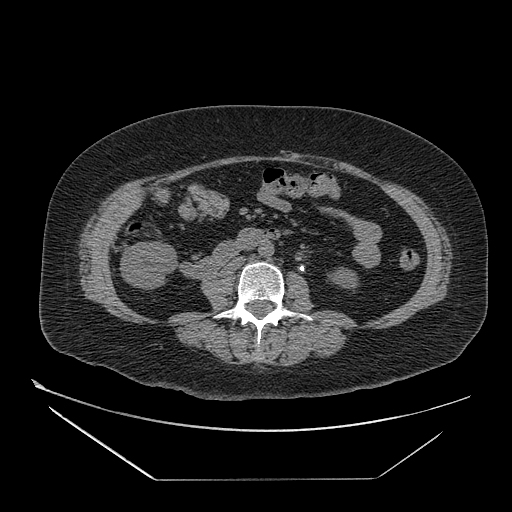
[im 239/369  soft-tissue]
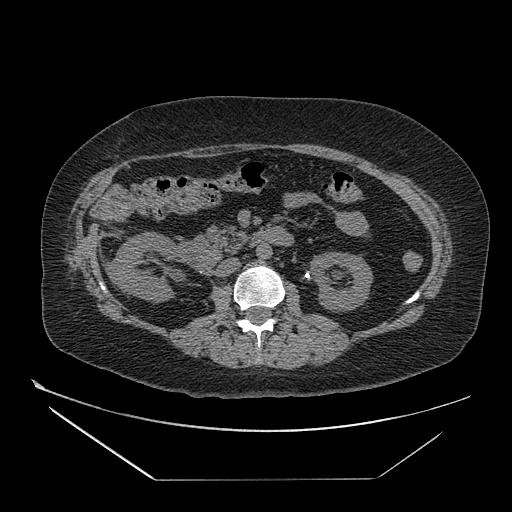
[im 239/369  bone]
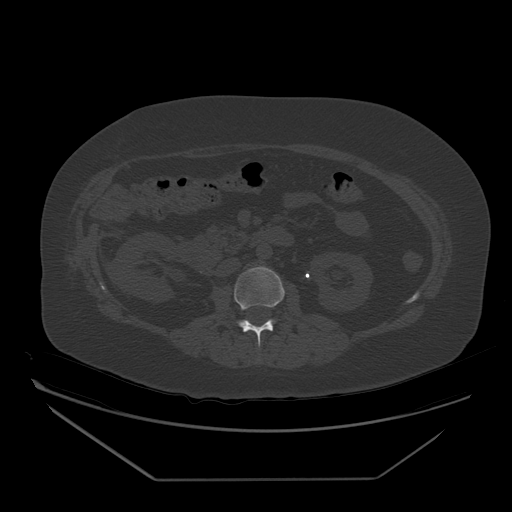
[im 260/369  soft-tissue]
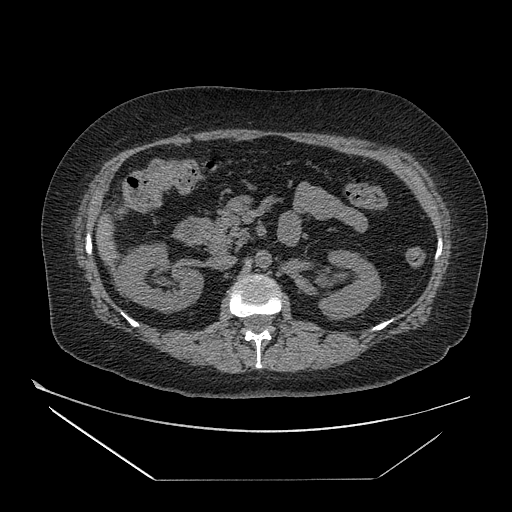
[im 282/369  soft-tissue]
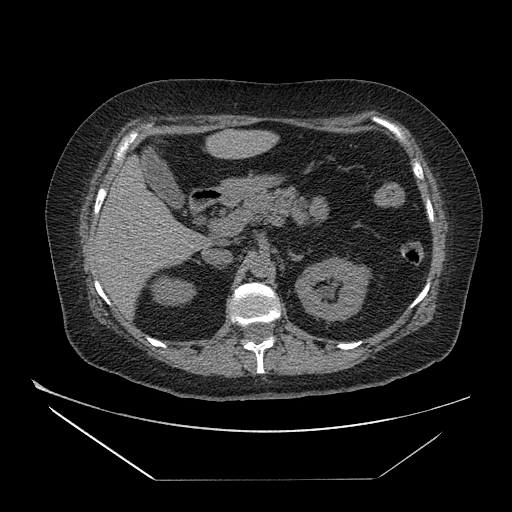
[im 282/369  lung]
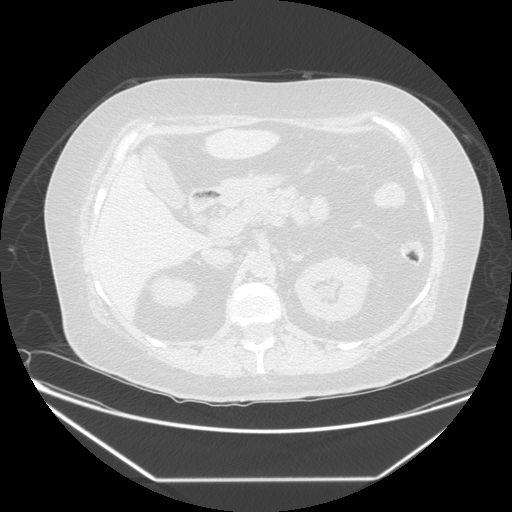
[im 304/369  lung]
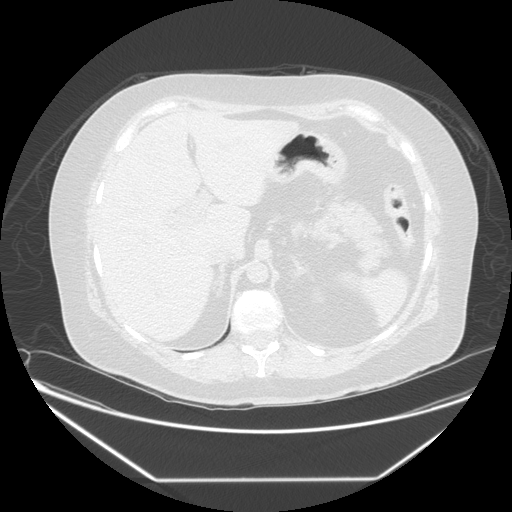
[im 325/369  soft-tissue]
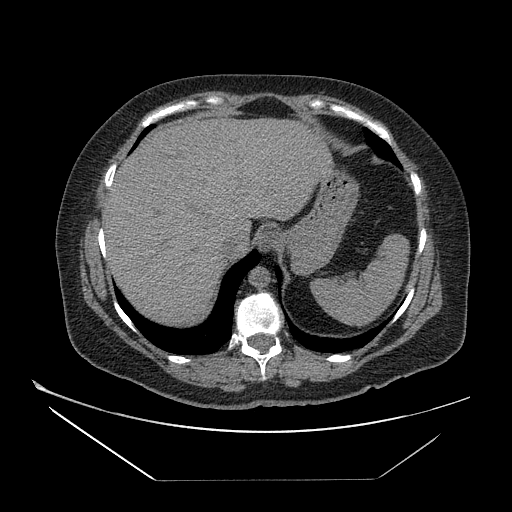
[im 325/369  lung]
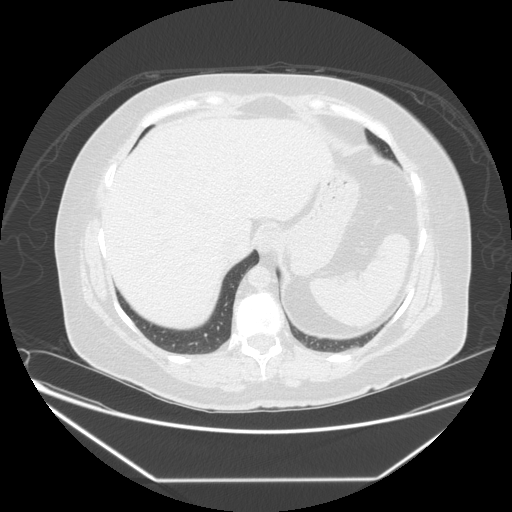
[im 347/369  soft-tissue]
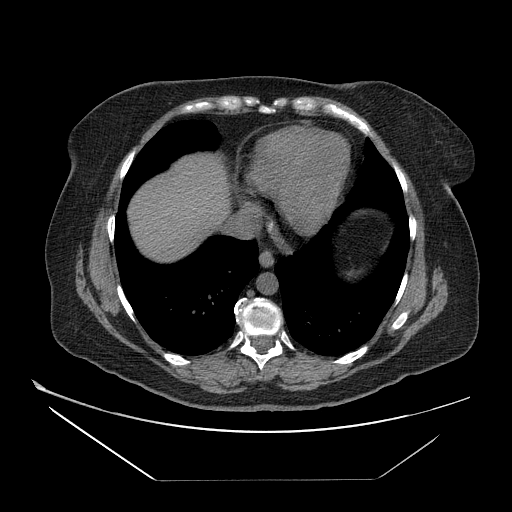
[im 347/369  lung]
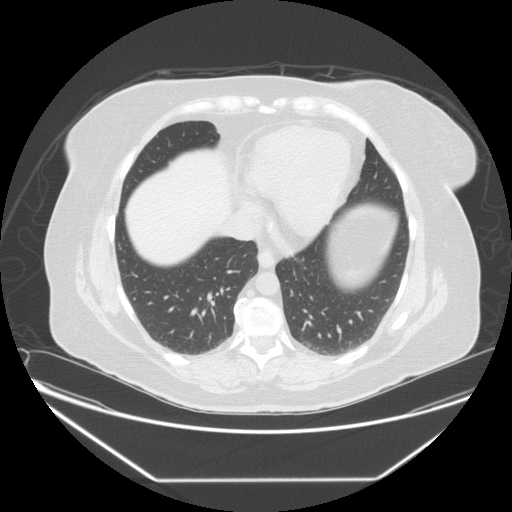

[14 of 32 positions shown; findings below may reference images not displayed]

FINDINGS: There is no pleural or pericardial effusion.

Spleen negative.

Adrenal glands negative.

Pancreas negative.

The liver is normal in attenuation and morphology.

There mild left side hydronephrosis.  A left-sided Nephro ureteral
stent in place.

A right-sided ureteroplasty has been performed. The patient has a
mobilized ileal ureter on the right. There the is mild right-sided
pelvocaliectasis and hydroureter without evidence for high-grade
obstruction.

There is no significant free fluid within the upper abdomen.

No abnormal fluid collections are noted.

There is a right lateral to the ventral abdominal wall hernia which
contains nonobstructed loops of bowel.  The colon to the

Review of the visualized osseous structures shows mild lumbar
spondylosis.  The
IMPRESSION: 1.  Mild bilateral hydronephrosis and hydroureter.  In this patient
with a history of chronic renal obstruction status post left Nephro
ureteral stenting and right to ureteral plasty this is a
nonspecific finding.  A repeat nuclear medicine renogram may be
helpful to assess for high-grade obstruction.

CT PELVIS
FINDINGS: The distal portion of a left-sided Nephro ureteral stent
is within the urinary bladder.

The right ileal ureter inserts along the dome of the bladder.
There is no evidence for obstructing mass or stone.

No free fluid identified within the pelvis. There is a partially
calcified fibroid arising the posterior wall uterus.

No enlarged lymph nodes are identified.
IMPRESSION: 1.  No acute pelvic CT findings.

## 2008-07-16 ENCOUNTER — Inpatient Hospital Stay (HOSPITAL_COMMUNITY): Admission: EM | Admit: 2008-07-16 | Discharge: 2008-07-23 | Payer: Self-pay | Admitting: Emergency Medicine

## 2008-07-21 IMAGING — XA IR NEPHROSTOGRAM*R*
5 of 8 series · 15 of 24 positions shown · non-contrast
Comparison: none

EXAMINATION:

ULTRASOUND GUIDANCE FOR NEEDLE PLACEMENT.
ANTEGRADE RIGHT PYELOGRAM
CLINICAL DATA: Acute renal failure, status post right ileal ureter
reconstruction for a chronic UPJ obstruction.  Indwelling left
internal ureteral stent.  Baseline creatinine has elevated from 1.5-
4.5.

[Series 1: run · 11 of 18 slices shown (1 of 5)]
[im 1/18]
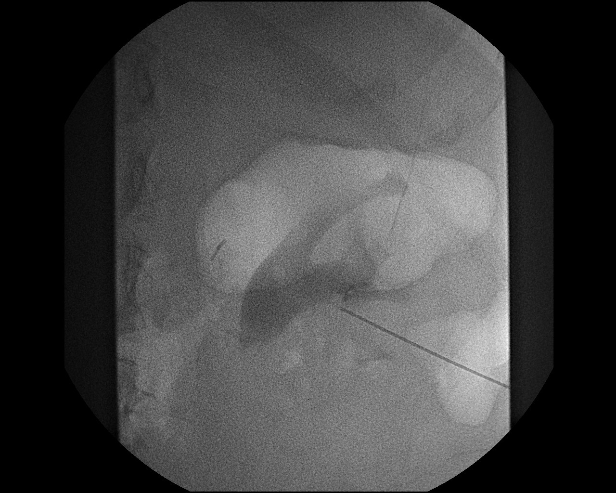
[im 3/18]
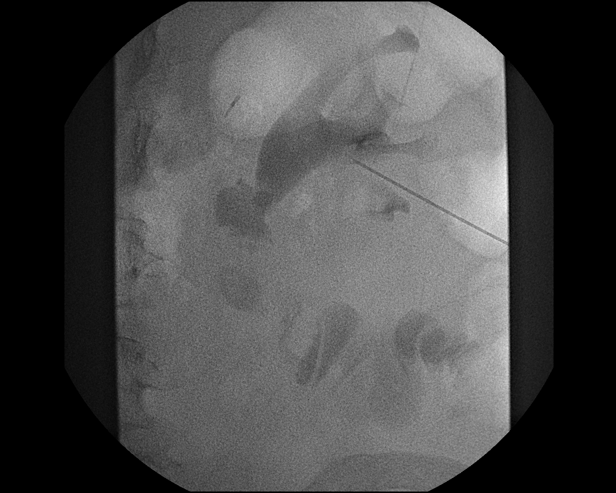
[im 5/18]
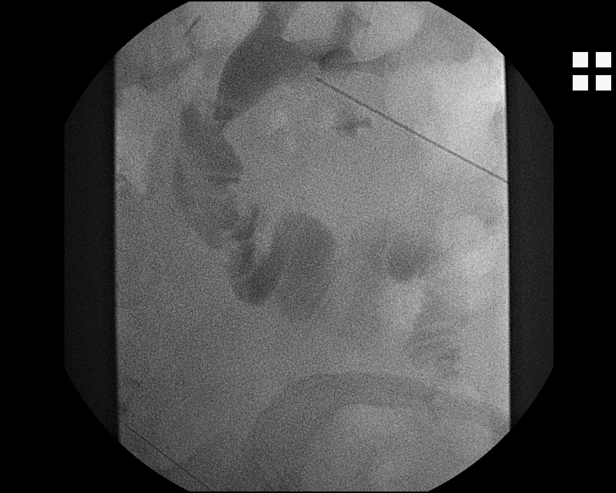
[im 6/18]
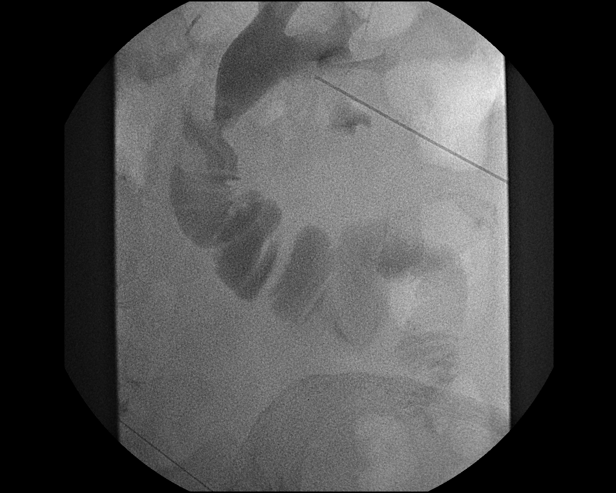
[im 8/18]
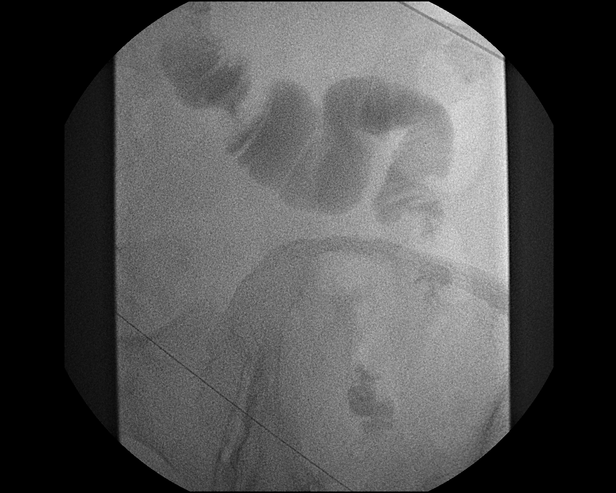
[im 9/18]
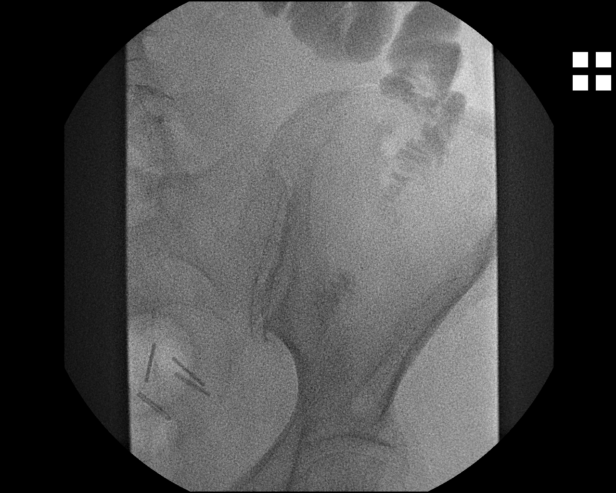
[im 11/18]
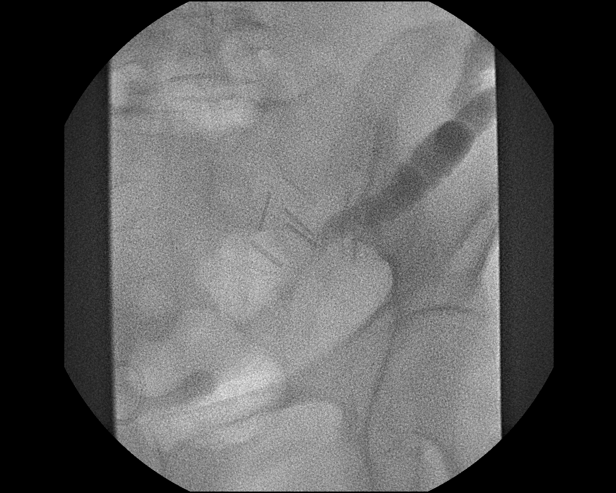
[im 13/18]
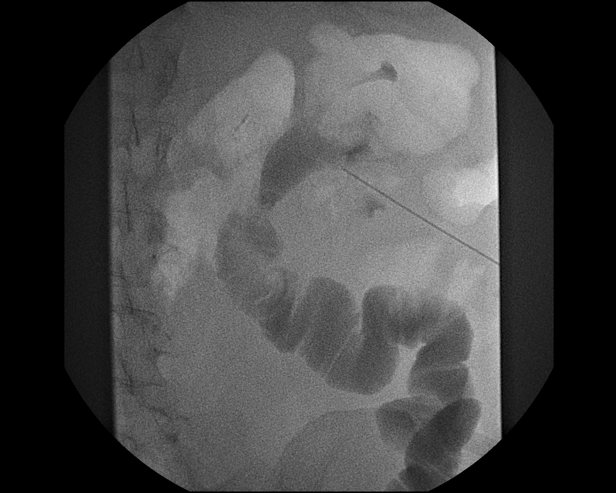
[im 14/18]
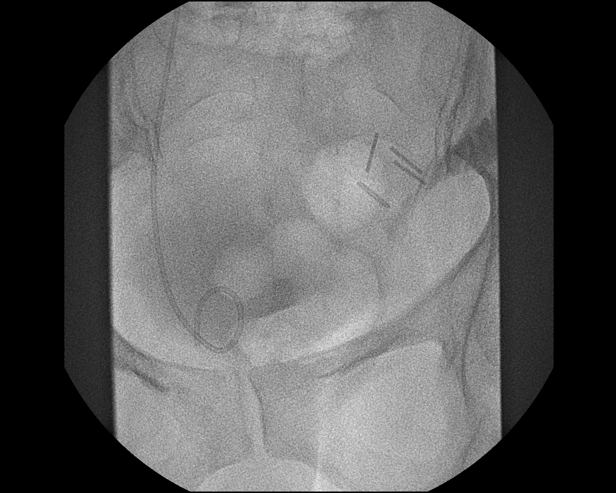
[im 16/18]
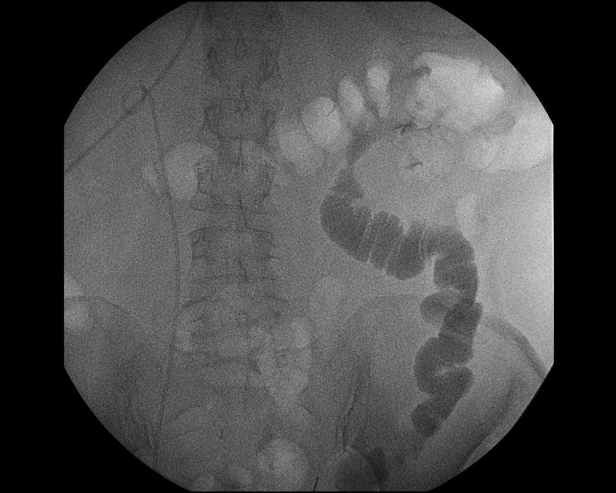
[im 18/18]
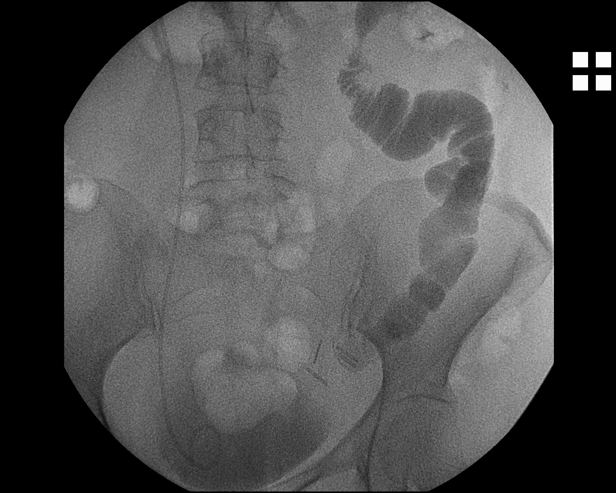

[Series 2: run · 1 of 1 slices shown (2 of 5)]
[im 1/1]
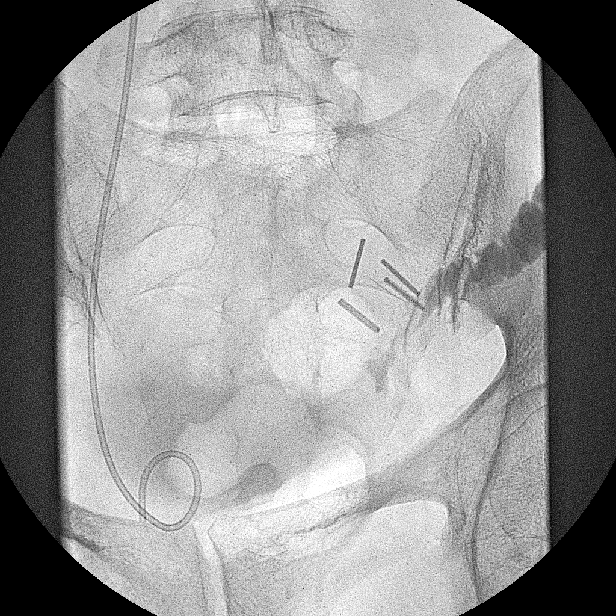

[Series 4: run · 1 of 1 slices shown (3 of 5)]
[im 1/1]
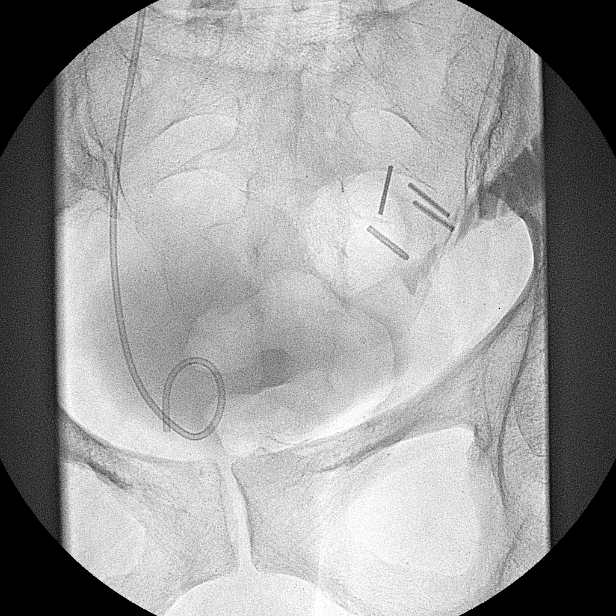

[Series 5: run · 1 of 1 slices shown (4 of 5)]
[im 1/1]
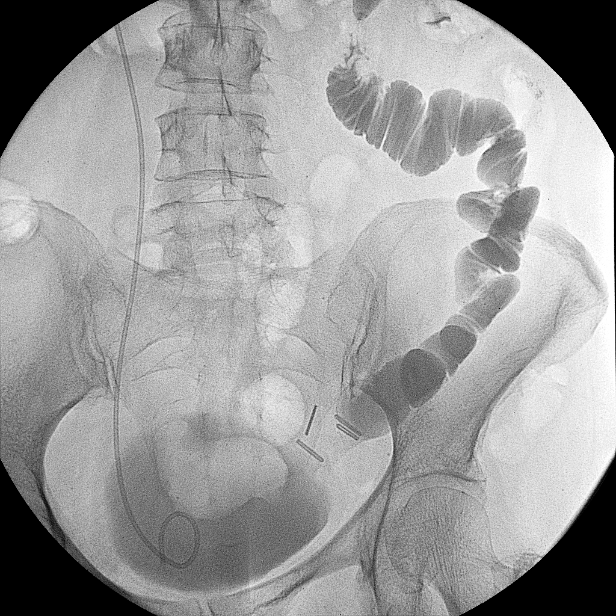

[Series 7: run · 1 of 1 slices shown (5 of 5)]
[im 1/1]
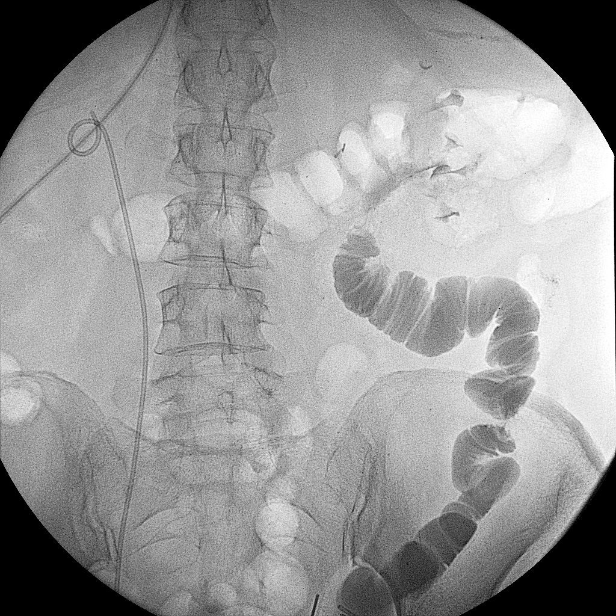

[15 of 24 positions shown; findings below may reference images not displayed]

Date:[DATE] [DATE]

Radiologist:[email or web address].HENCCA, M.D.

Medications:100 mcg Fentanyl

Guidance:Ultrasound and fluoroscopic

Fluoroscopy time:9.2 minutes

Sedation time:13 minutes

Contrast volume:50 ml

Complications:No immediate

PROCEDURE/FINDINGS:

Informed consent was obtained from the patient following
explanation of the procedure, risks, benefits and alternatives.
The patient understands, agrees and consents for the procedure.
All questions were addressed.  A time out was performed.

The patient was positioned prone.  The right flank was sterilely
prepped and draped.  Under sterile conditions and local anesthesia,
a 21 gauge 15 cm access needle was utilized to access a lower pole
infundibulum of the right kidney. The right kidney is not
hydronephrotic.  This was done with direct ultrasound guidance.
There was return of blood tinged urine.  Contrast injection was
performed for an antegrade pyelogram.

Right pyelogram:  The right renal pelvis and collecting system are
nondilated.  The proximal anastomosis to the ileal ureter is
patent.  Contrast flows freely into the peristalsing ileal ureter.
With time and gravity, contrast completely fills the ileal ureter
and passes into the bladder.  No evidence of significant
hydronephrosis.  Negative for obstruction.

Three additional passes were made with a second 21 gauge access
needle and a posterior nondilated lower pole calix could not be
accessed safely with ultrasound guidance or fluoroscopy.
Therefore, a trial nephrostomy catheter was not inserted.
Additional passes were not made to limit trauma to the kidney.
Needles were removed.

The patient tolerated the PET procedure well.  No immediate
complication.
IMPRESSION: Ultrasound and fluoroscopically guided right kidney
needle access for an antegrade pyelogram.

No evidence of obstruction or hydronephrosis.  Right ileal ureter
is widely patent with contrast flowing into the bladder.  No
proximal or distal anastomotic stricture appreciated.

Critical test results telephoned to Dr. HENCCA at the time of
interpretation on date [DATE] at time [DATE] p.m..

## 2008-07-21 IMAGING — CT CT ABDOMEN W/O CM
2 of 4 series · 14 of 32 positions shown, 19 images · non-contrast
Comparison: [DATE]

CT ABDOMEN

CLINICAL DATA: Acute renal failure.  Follow-up hydronephrosis
following ureteral stent placement.

CT ABDOMEN AND PELVIS WITHOUT CONTRAST
TECHNIQUE: Multidetector CT imaging of the abdomen and pelvis was
performed following the standard
protocol without intravenous contrast.

[Series 2: a/p · axial · 0.70mm/px · z∈[-375,-65]mm · 6 of 88 slices shown, 11 images]
[im 13/88  soft-tissue]
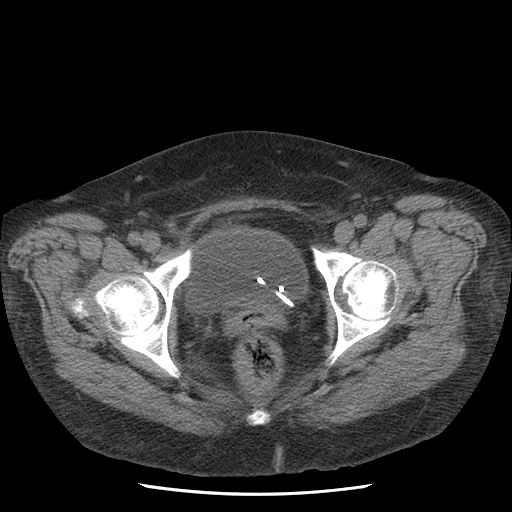
[im 13/88  bone]
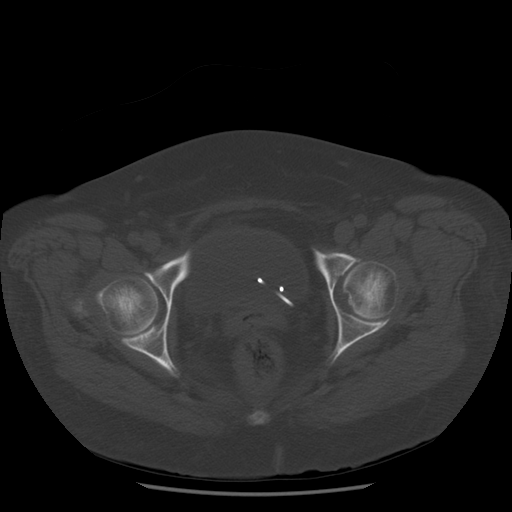
[im 25/88  soft-tissue]
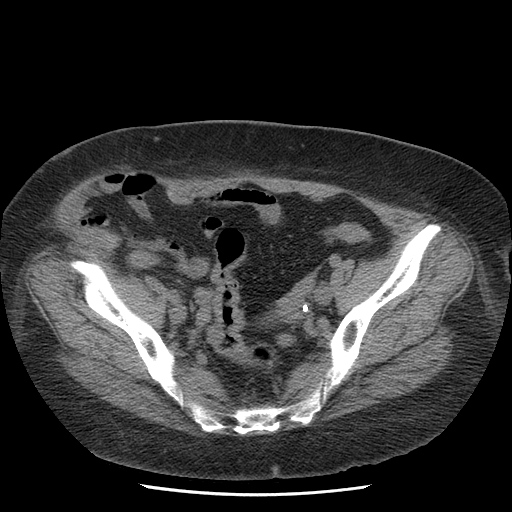
[im 38/88  soft-tissue]
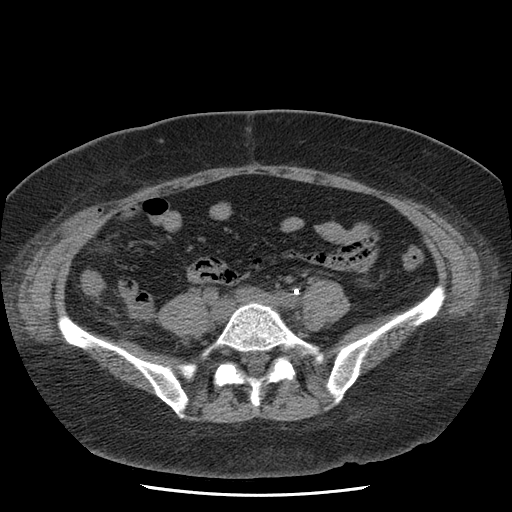
[im 38/88  lung]
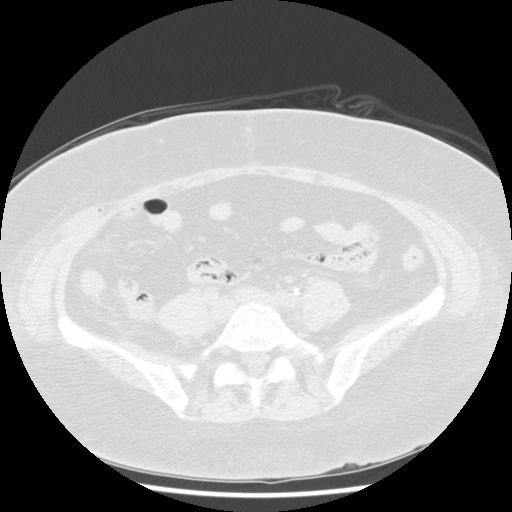
[im 50/88  soft-tissue]
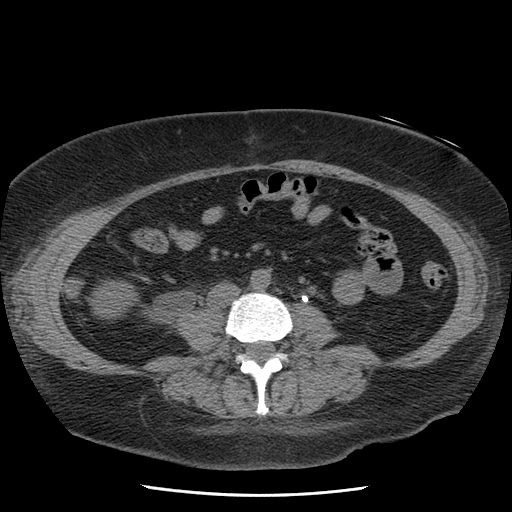
[im 50/88  lung]
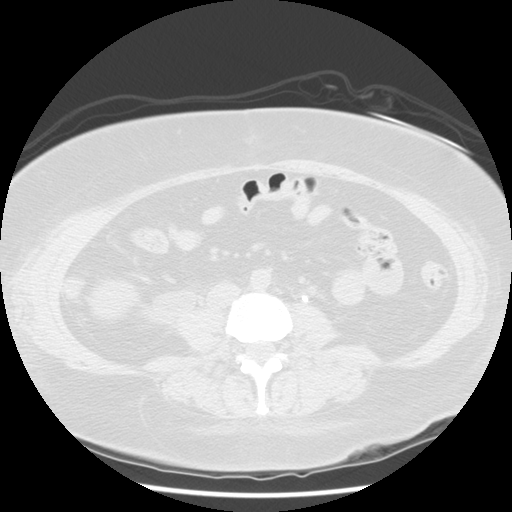
[im 63/88  soft-tissue]
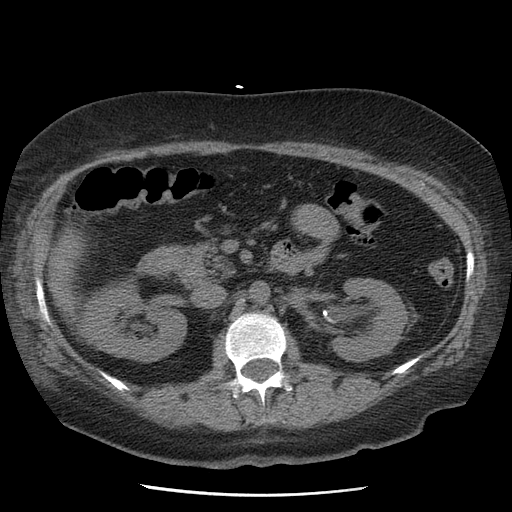
[im 63/88  lung]
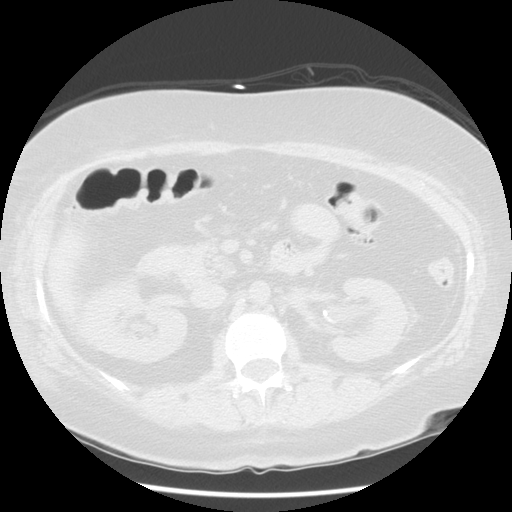
[im 75/88  soft-tissue]
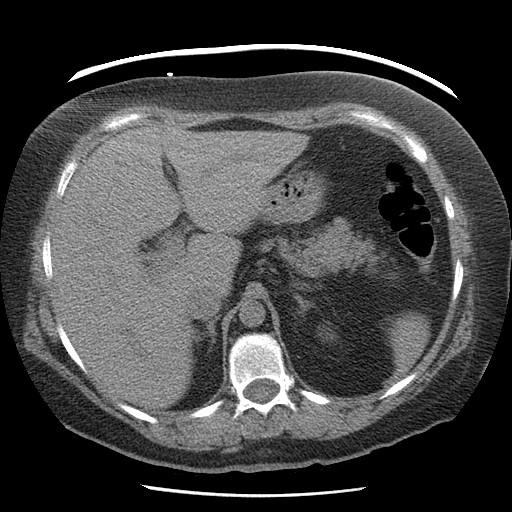
[im 75/88  lung]
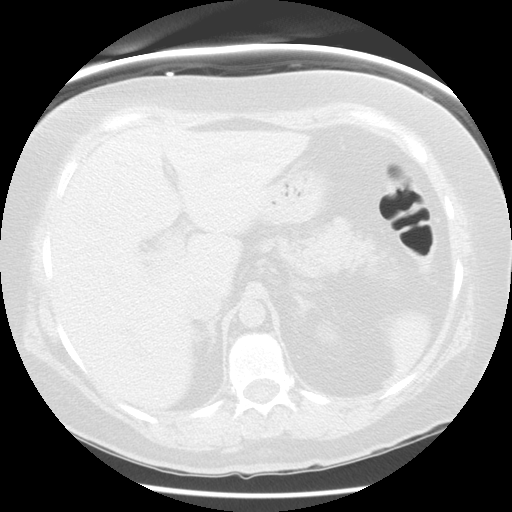

[Series 401: sagittals · sagittal · 0.87mm/px · 8 of 165 slices shown]
[im 13/165  soft-tissue]
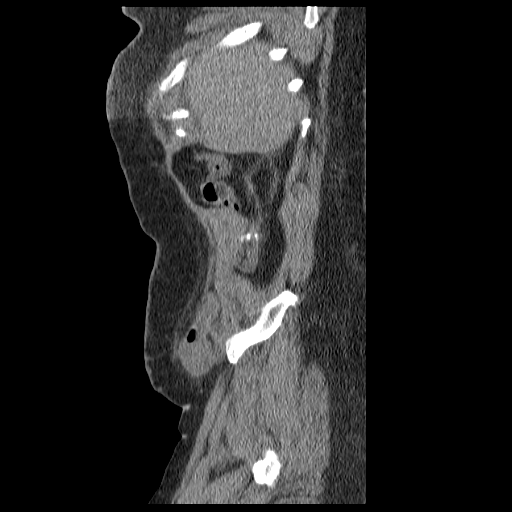
[im 38/165  soft-tissue]
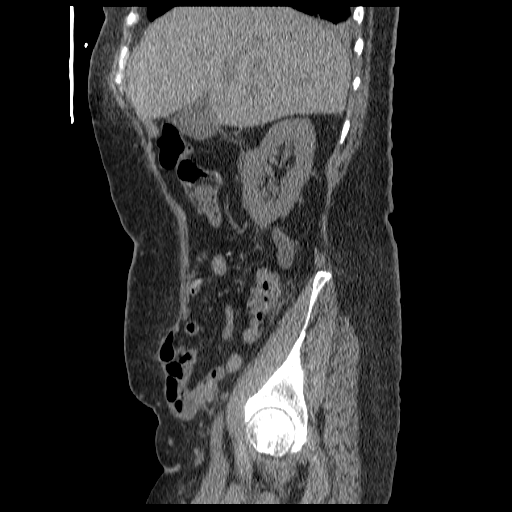
[im 51/165  soft-tissue]
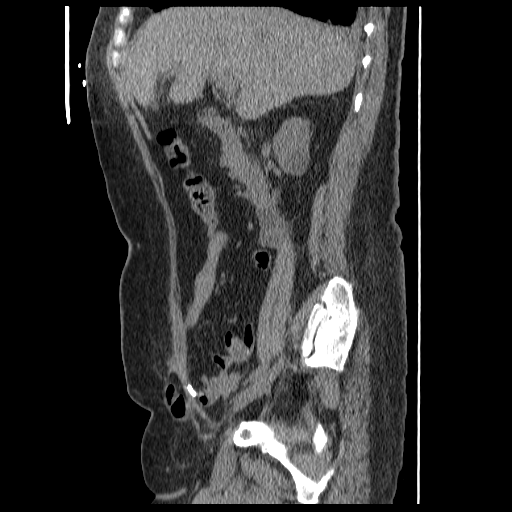
[im 76/165  soft-tissue]
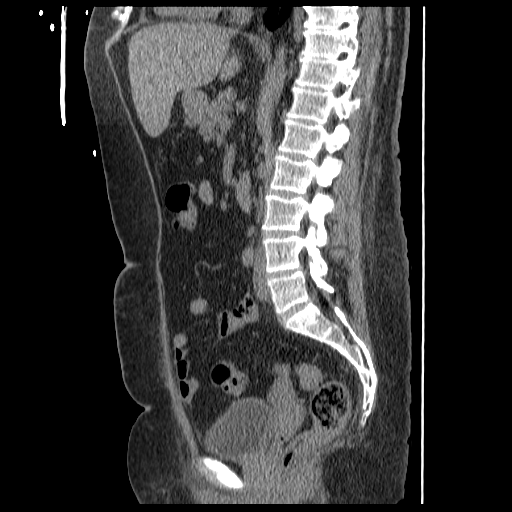
[im 89/165  soft-tissue]
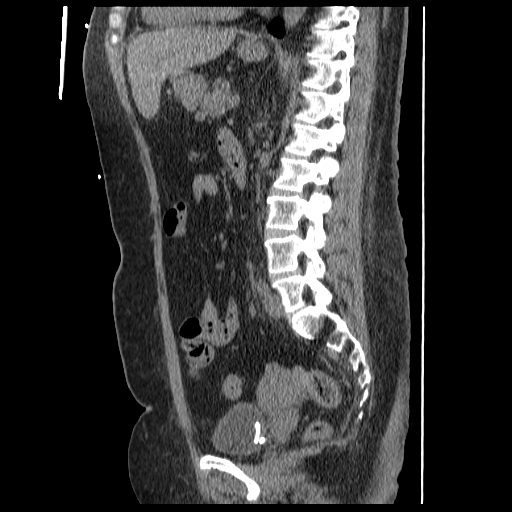
[im 114/165  soft-tissue]
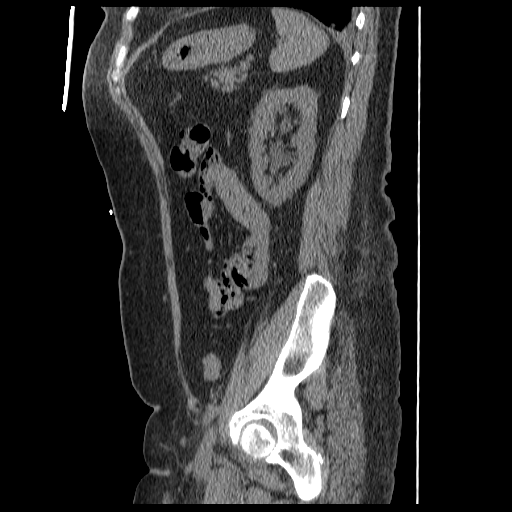
[im 127/165  soft-tissue]
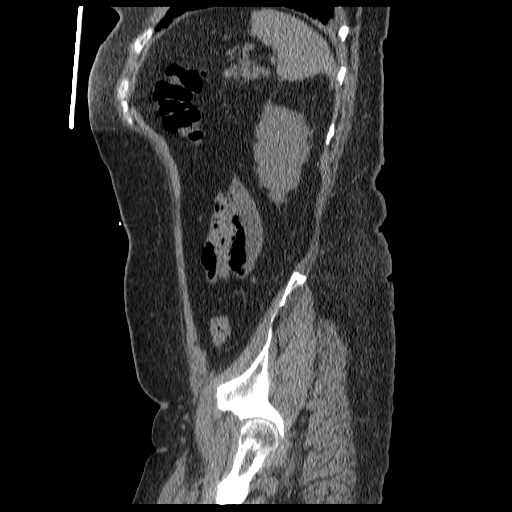
[im 152/165  soft-tissue]
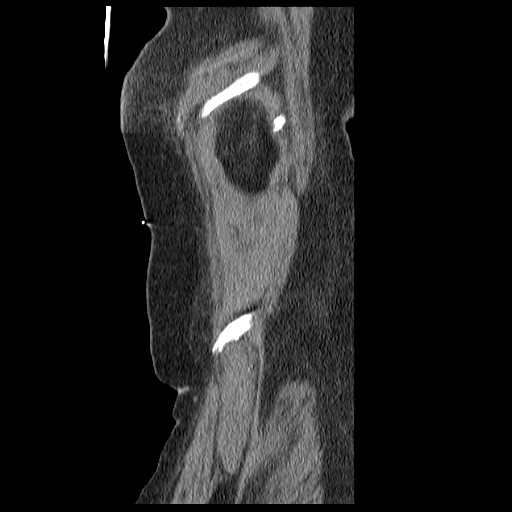

[14 of 32 positions shown; findings below may reference images not displayed]

FINDINGS: Small pleural effusions is seen bilaterally which are
new since prior exam.  Remains in appropriate position and mild
residual left-sided pelvicaliectasis remains stable.  A small
amount of gas is noted the right intrarenal collecting system, and
minimal prominence of the right renal collecting system remains
stable.  There is no evidence of right ureteral dilatation and no
calculi are identified.

The other abdominal parenchymal organs are unremarkable appearance
on this noncontrast study.  There is no evidence of mass or
inflammatory process.
IMPRESSION: 1.  Left ureteral stent remains in appropriate position, and mild
left renal pelvicaliectasis is stable.
2.  Minimal right renal pelvicaliectasis, without ureterectasis,
which is also stable in appearance.
3.  New small bilateral pleural effusions.

CT PELVIS
FINDINGS: Left ureteral stent remains in appropriate position.  A
1.6 cm posterior uterine fibroid with central calcification is
unchanged.  A right lower quadrant anterior abdominal wall hernia
containing small bowel is also unchanged.  There is no evidence of
bowel obstruction.  There is no evidence of inflammatory process or
abnormal fluid collections..
IMPRESSION: 1.  No acute findings.
2.  Stable small uterine fibroid measuring 1.6 cm.
3.  Stable right lower quadrant anterior abdominal wall hernia
containing small bowel.

## 2008-07-26 ENCOUNTER — Ambulatory Visit (HOSPITAL_COMMUNITY): Admission: RE | Admit: 2008-07-26 | Discharge: 2008-07-26 | Payer: Self-pay | Admitting: Urology

## 2008-09-22 ENCOUNTER — Ambulatory Visit (HOSPITAL_COMMUNITY): Admission: RE | Admit: 2008-09-22 | Discharge: 2008-09-22 | Payer: Self-pay | Admitting: Urology

## 2008-10-03 ENCOUNTER — Ambulatory Visit: Payer: Self-pay | Admitting: "Endocrinology

## 2008-10-24 ENCOUNTER — Encounter: Admission: RE | Admit: 2008-10-24 | Discharge: 2008-10-24 | Payer: Self-pay | Admitting: Gynecology

## 2008-10-24 IMAGING — MG MM SCREEN MAMMOGRAM BILATERAL
3 series · 3 of 3 positions shown · non-contrast
Comparison: none

DG SCREEN MAMMOGRAM BILATERAL
Bilateral CC and MLO view(s) were taken.

DIGITAL SCREENING MAMMOGRAM WITH CAD:
There are scattered fibroglandular densities.  No masses or malignant type calcifications are 
identified.  Compared with prior studies.

[R CC]
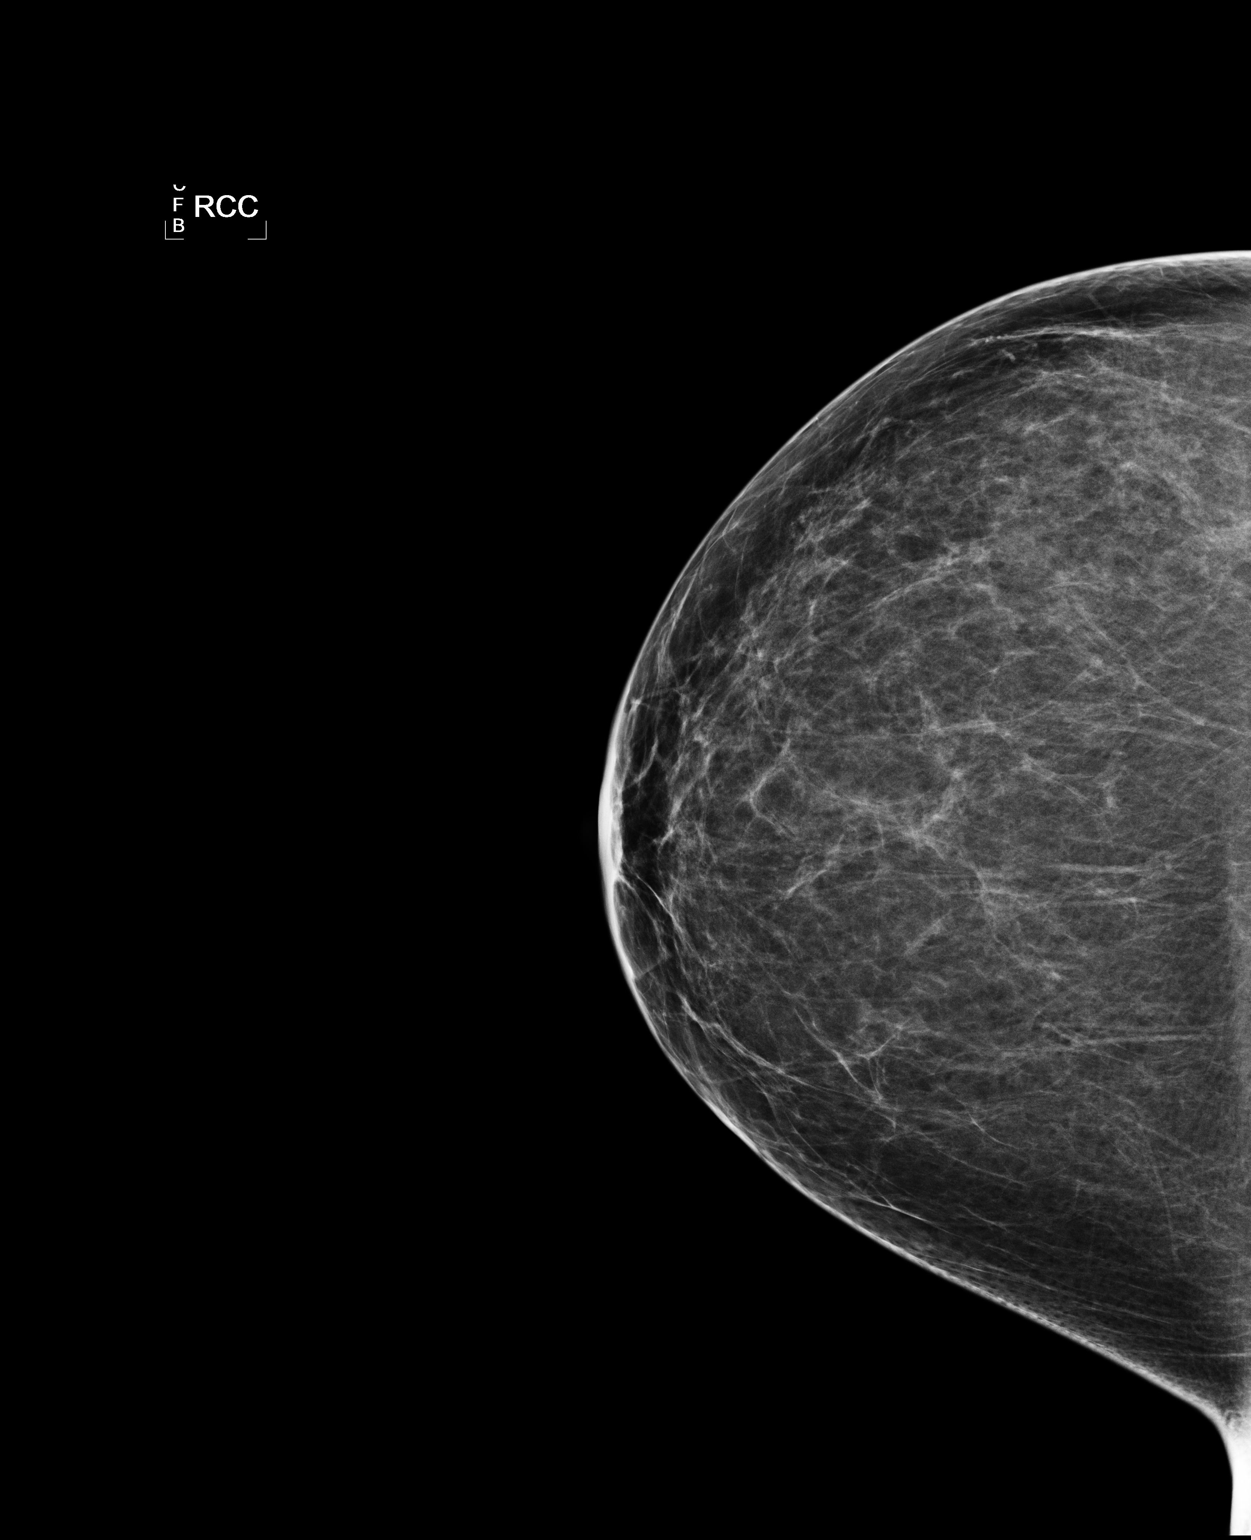

[L CC]
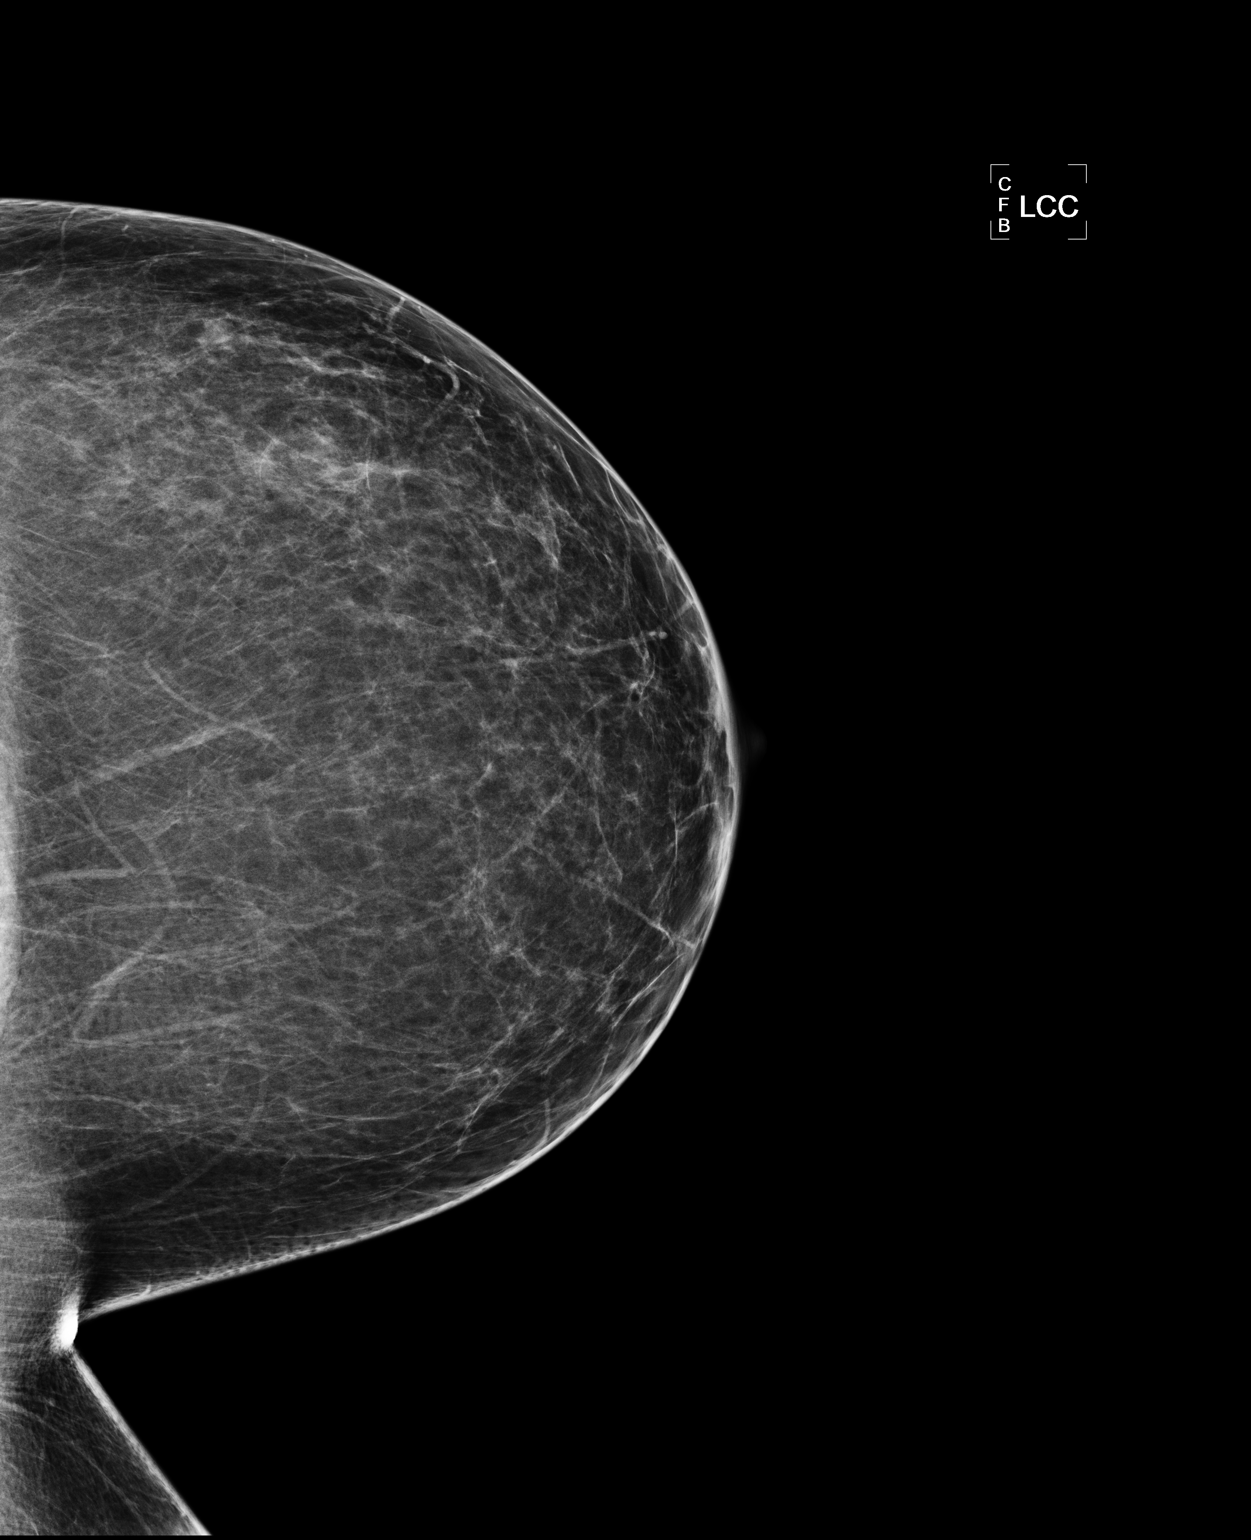

[L MLO]
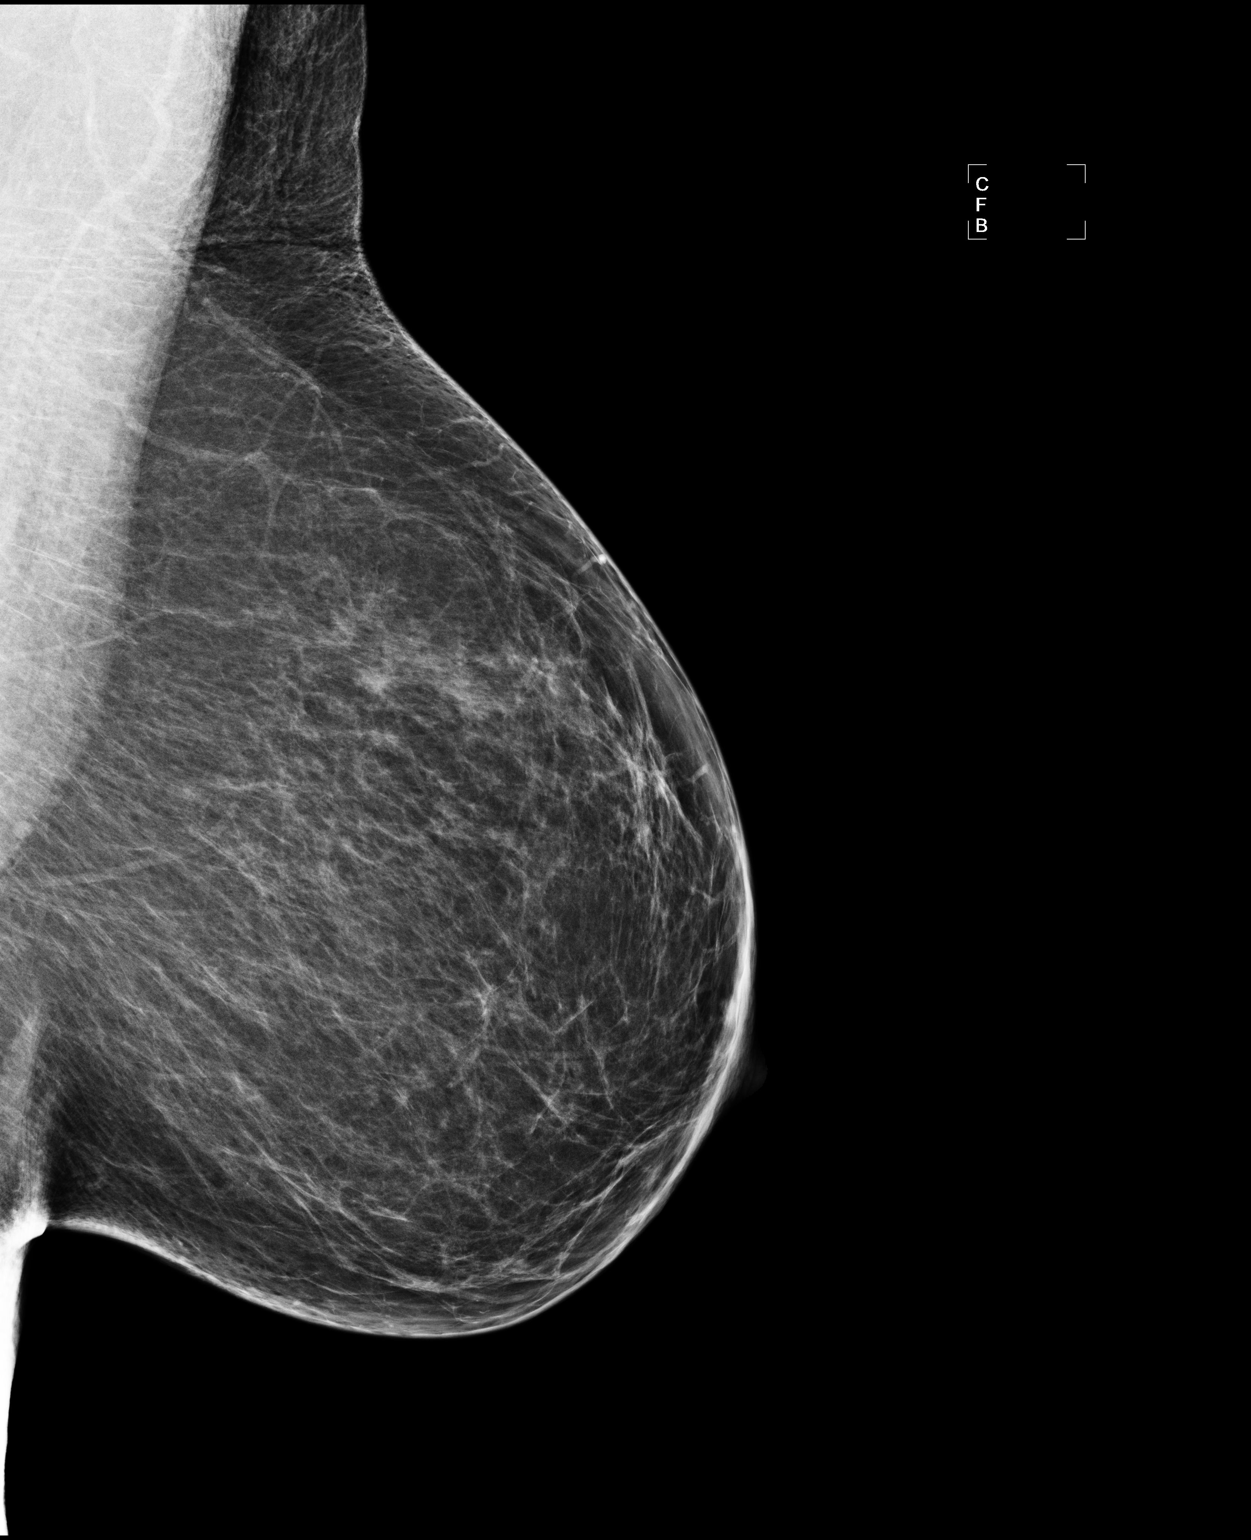

[3 of 3 positions shown; findings below may reference images not displayed]

IMPRESSION: No specific mammographic evidence of malignancy.  Next screening mammogram is recommended in one 
year.

ASSESSMENT: Negative - BI-RADS 1

Screening mammogram in 1 year.
ANALYZED BY COMPUTER AIDED DETECTION. , THIS PROCEDURE WAS A DIGITAL MAMMOGRAM.

## 2008-10-27 ENCOUNTER — Ambulatory Visit: Payer: Self-pay | Admitting: Internal Medicine

## 2009-01-17 ENCOUNTER — Ambulatory Visit: Payer: Self-pay | Admitting: Vascular Surgery

## 2009-03-02 ENCOUNTER — Ambulatory Visit (HOSPITAL_COMMUNITY): Admission: RE | Admit: 2009-03-02 | Discharge: 2009-03-02 | Payer: Self-pay | Admitting: Vascular Surgery

## 2009-03-02 ENCOUNTER — Ambulatory Visit: Payer: Self-pay | Admitting: Vascular Surgery

## 2009-03-09 ENCOUNTER — Ambulatory Visit: Payer: Self-pay | Admitting: Internal Medicine

## 2009-04-06 ENCOUNTER — Ambulatory Visit: Payer: Self-pay | Admitting: Internal Medicine

## 2009-04-25 ENCOUNTER — Ambulatory Visit: Payer: Self-pay | Admitting: Internal Medicine

## 2009-05-22 ENCOUNTER — Ambulatory Visit: Payer: Self-pay | Admitting: "Endocrinology

## 2009-10-02 ENCOUNTER — Ambulatory Visit: Payer: Self-pay | Admitting: Internal Medicine

## 2009-10-18 ENCOUNTER — Ambulatory Visit: Payer: Self-pay | Admitting: "Endocrinology

## 2009-10-19 ENCOUNTER — Ambulatory Visit: Payer: Self-pay | Admitting: Internal Medicine

## 2009-10-31 ENCOUNTER — Ambulatory Visit: Payer: Self-pay | Admitting: Internal Medicine

## 2009-11-02 ENCOUNTER — Encounter: Admission: RE | Admit: 2009-11-02 | Discharge: 2009-11-02 | Payer: Self-pay | Admitting: Gynecology

## 2009-11-02 IMAGING — MG MM SCREEN MAMMOGRAM BILATERAL
4 series · 4 of 4 positions shown · non-contrast
Comparison: none

DG SCREEN MAMMOGRAM BILATERAL
Bilateral CC and MLO view(s) were taken.
Prior study comparison: [DATE], DG screen mammogram bilateral.

DIGITAL SCREENING MAMMOGRAM WITH CAD:
The breast tissue is almost entirely fatty.  A possible mass is noted in the left breast.  Spot 
compression views and possibly sonography are recommended for further evaluation.  In the right 
breast, no masses or malignant type calcifications are identified.  Compared with prior studies.
Images were processed with CAD.

[R CC]
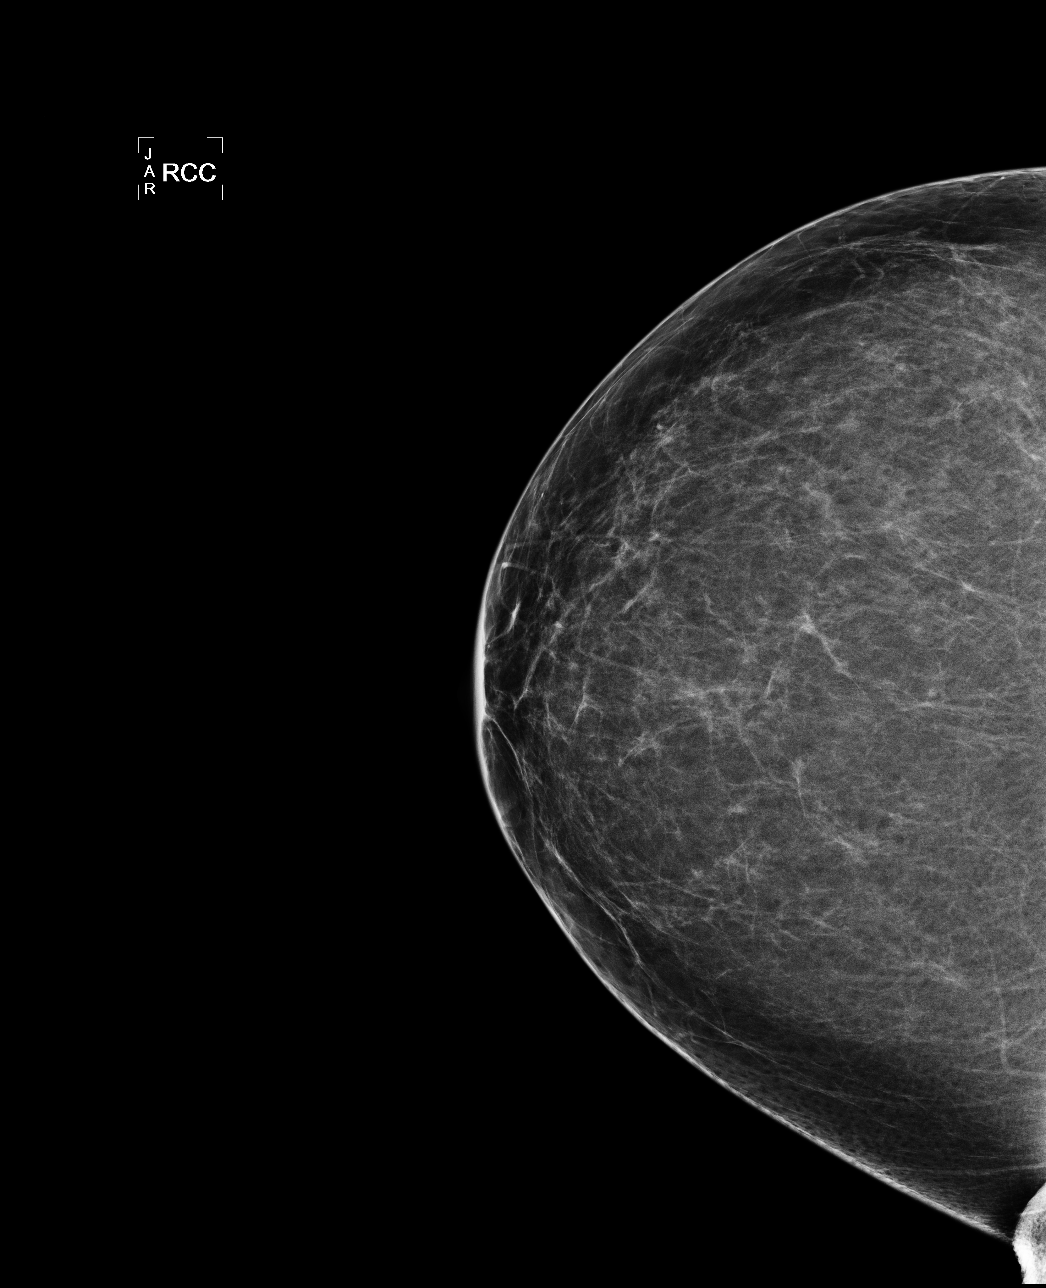

[L CC]
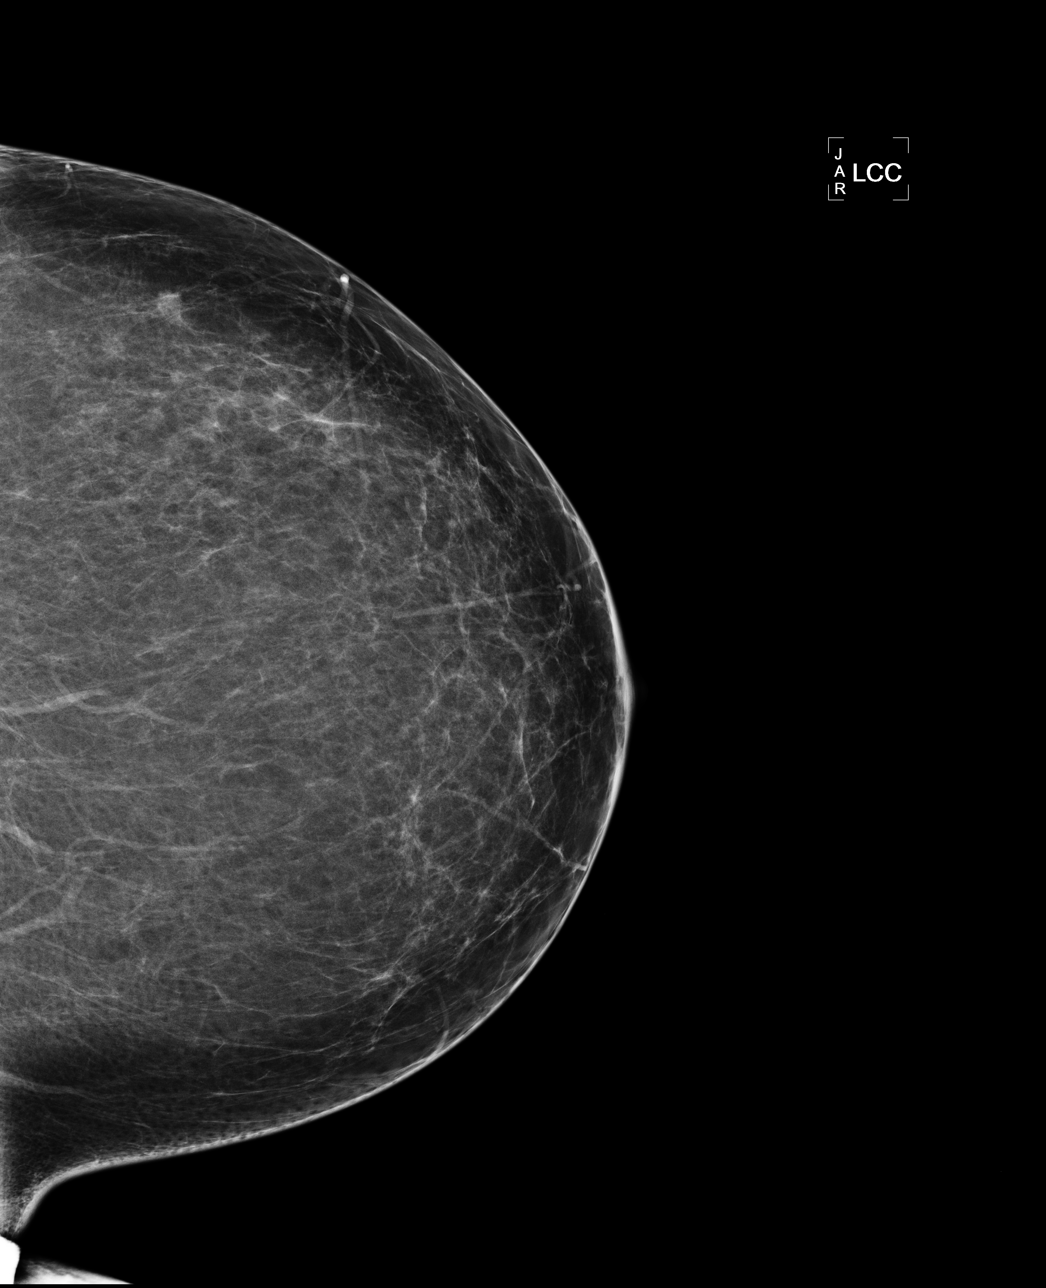

[L MLO]
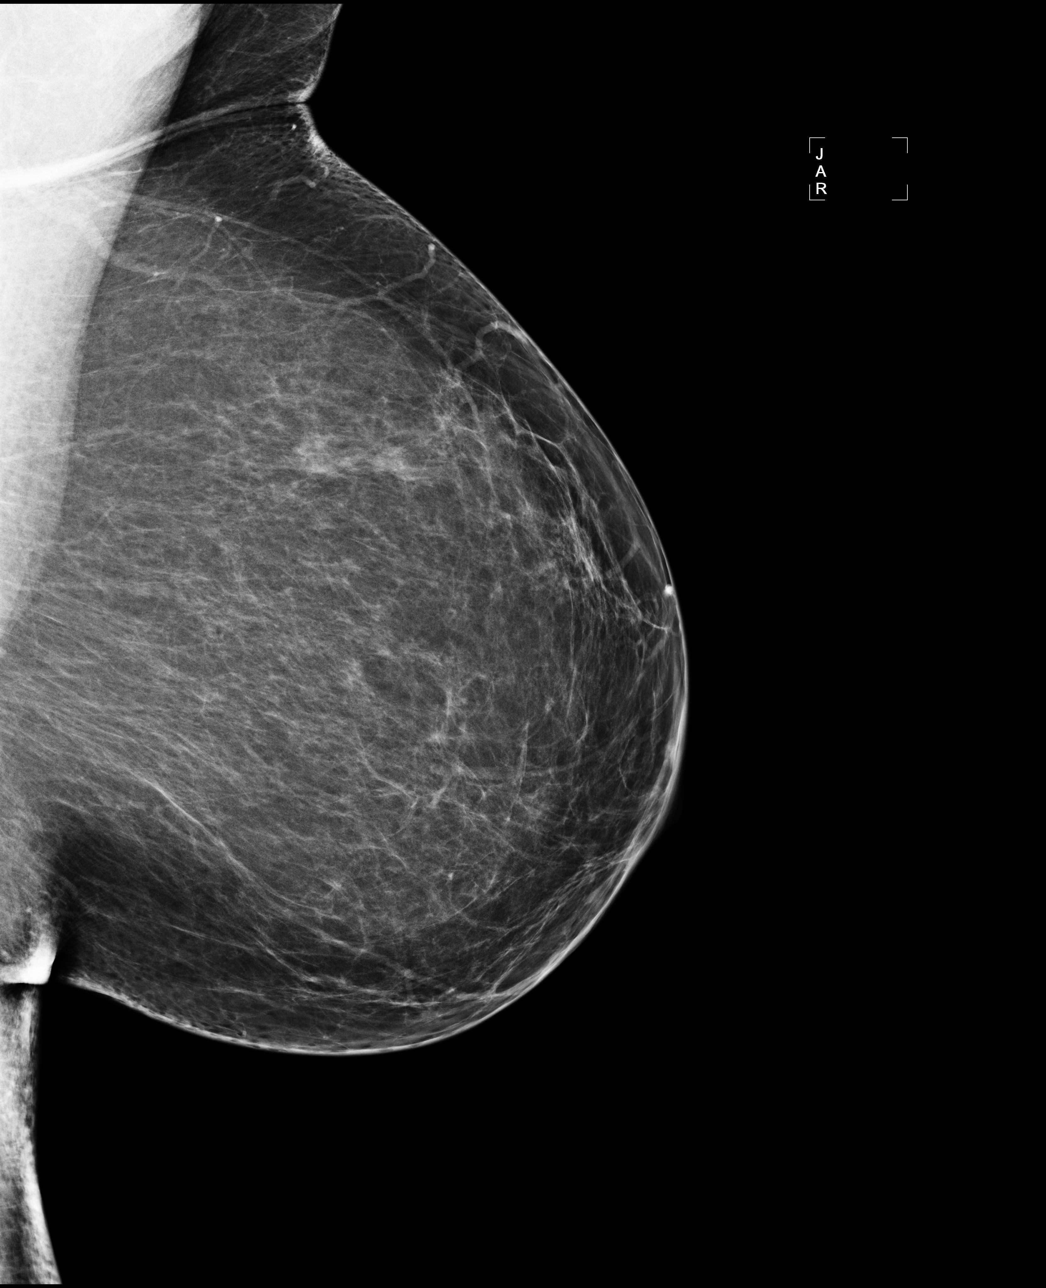

[R MLO]
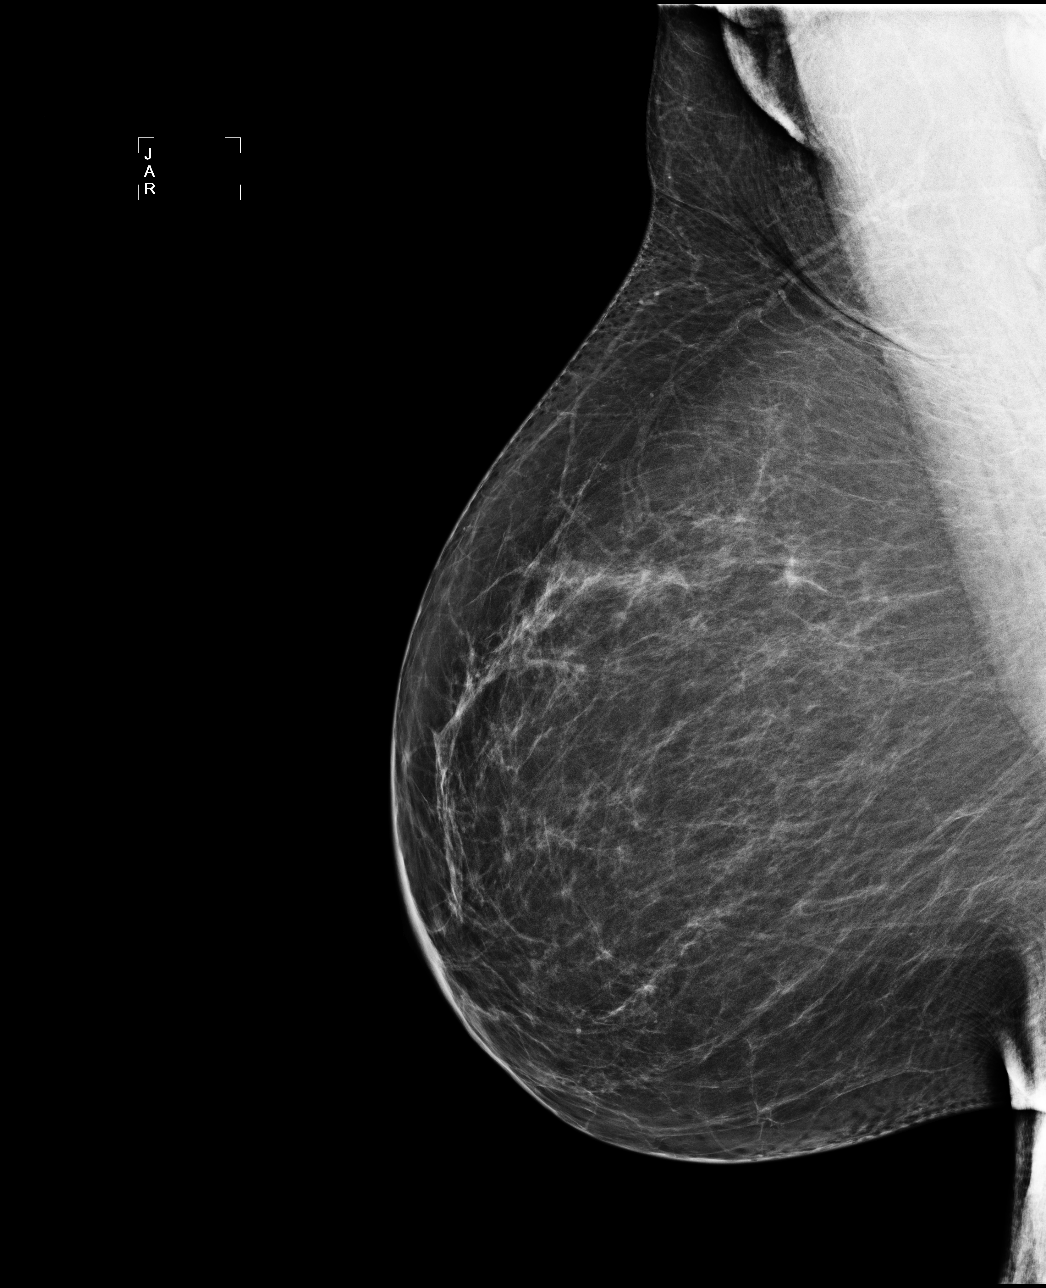

[4 of 4 positions shown; findings below may reference images not displayed]

IMPRESSION: Possible mass, left breast.  Additional evaluation is indicated.  The patient will be contacted for
additional studies and a supplementary report will follow.  No specific mammographic evidence of 
malignancy, right breast.

ASSESSMENT: Need additional imaging evaluation and/or prior mammograms for comparison - BI-RADS 0

Further imaging of the left breast.
,

## 2009-11-17 ENCOUNTER — Encounter: Admission: RE | Admit: 2009-11-17 | Discharge: 2009-11-17 | Payer: Self-pay | Admitting: Gynecology

## 2009-11-17 IMAGING — MG MM DIGITAL DIAG LTD L
2 series · 2 of 2 positions shown · non-contrast
Comparison: [DATE] [DATE], [DATE]; [DATE] [DATE], [DATE]; [DATE] [DATE], [DATE];
[DATE] [DATE], [DATE]

CLINICAL DATA: Further evaluation of left asymmetry

DIGITAL DIAGNOSTIC  LEFT  MAMMOGRAM   AND LEFT BREAST ULTRASOUND:

[L CC]
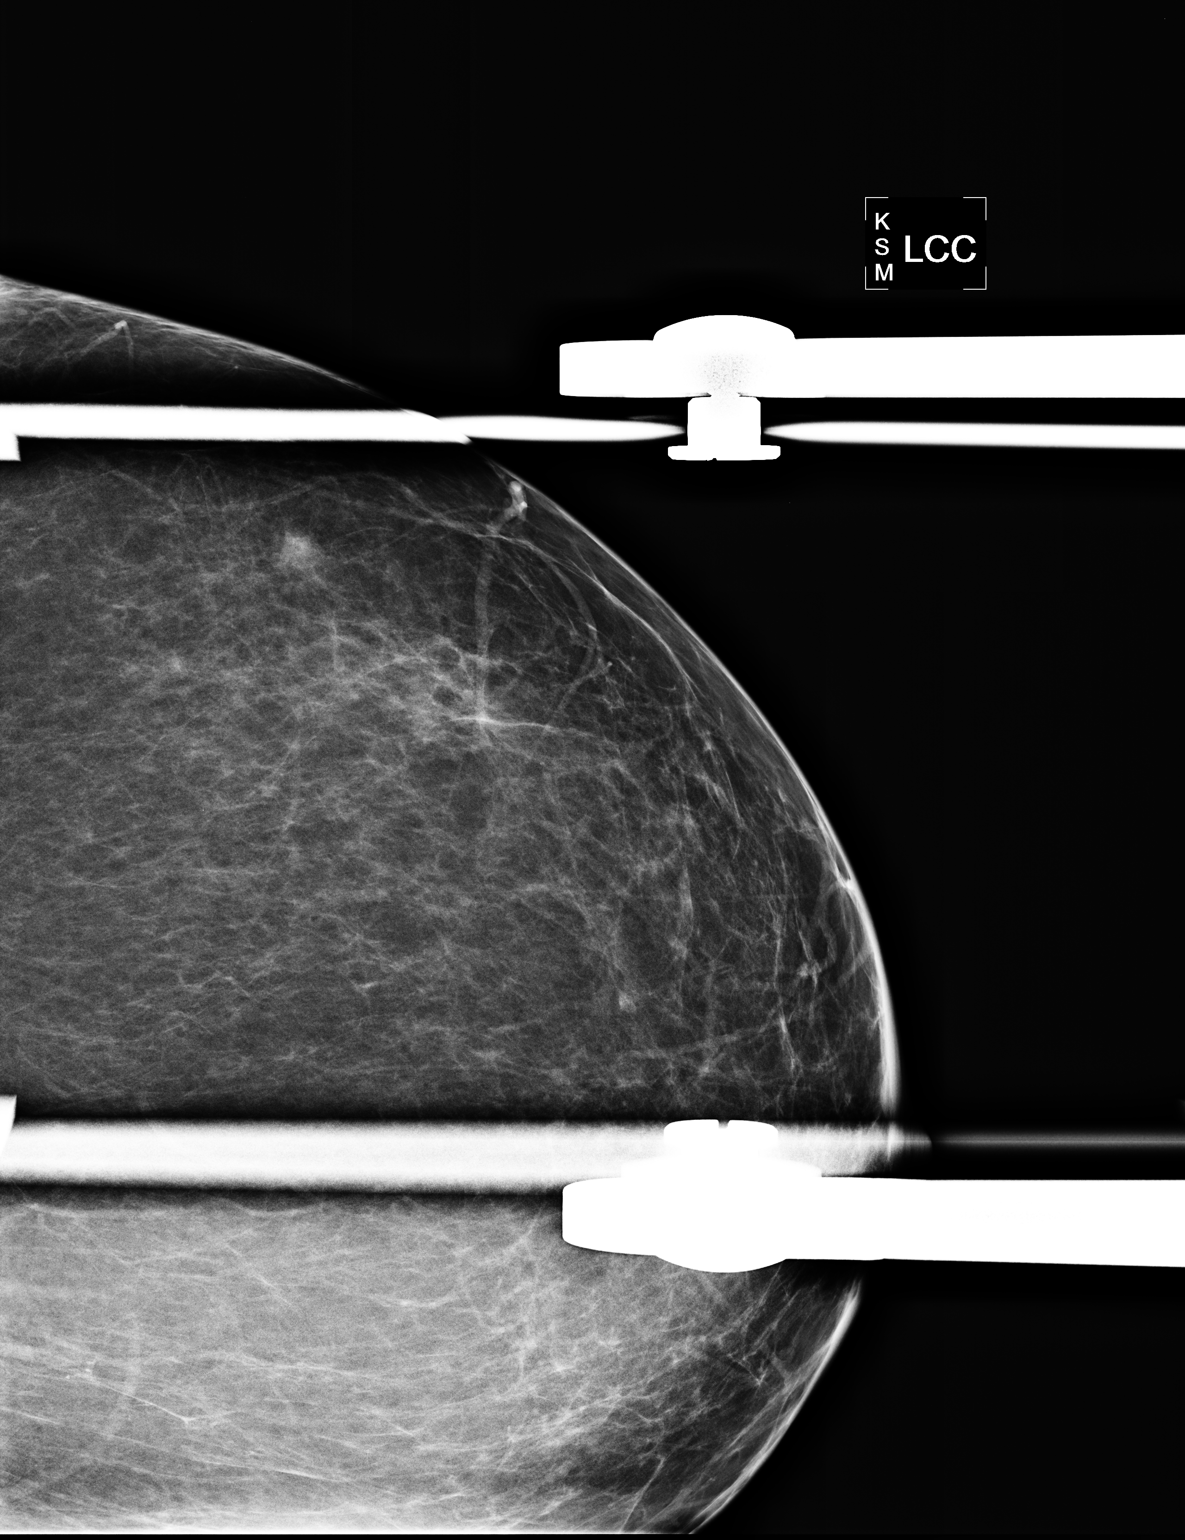

[L MLO]
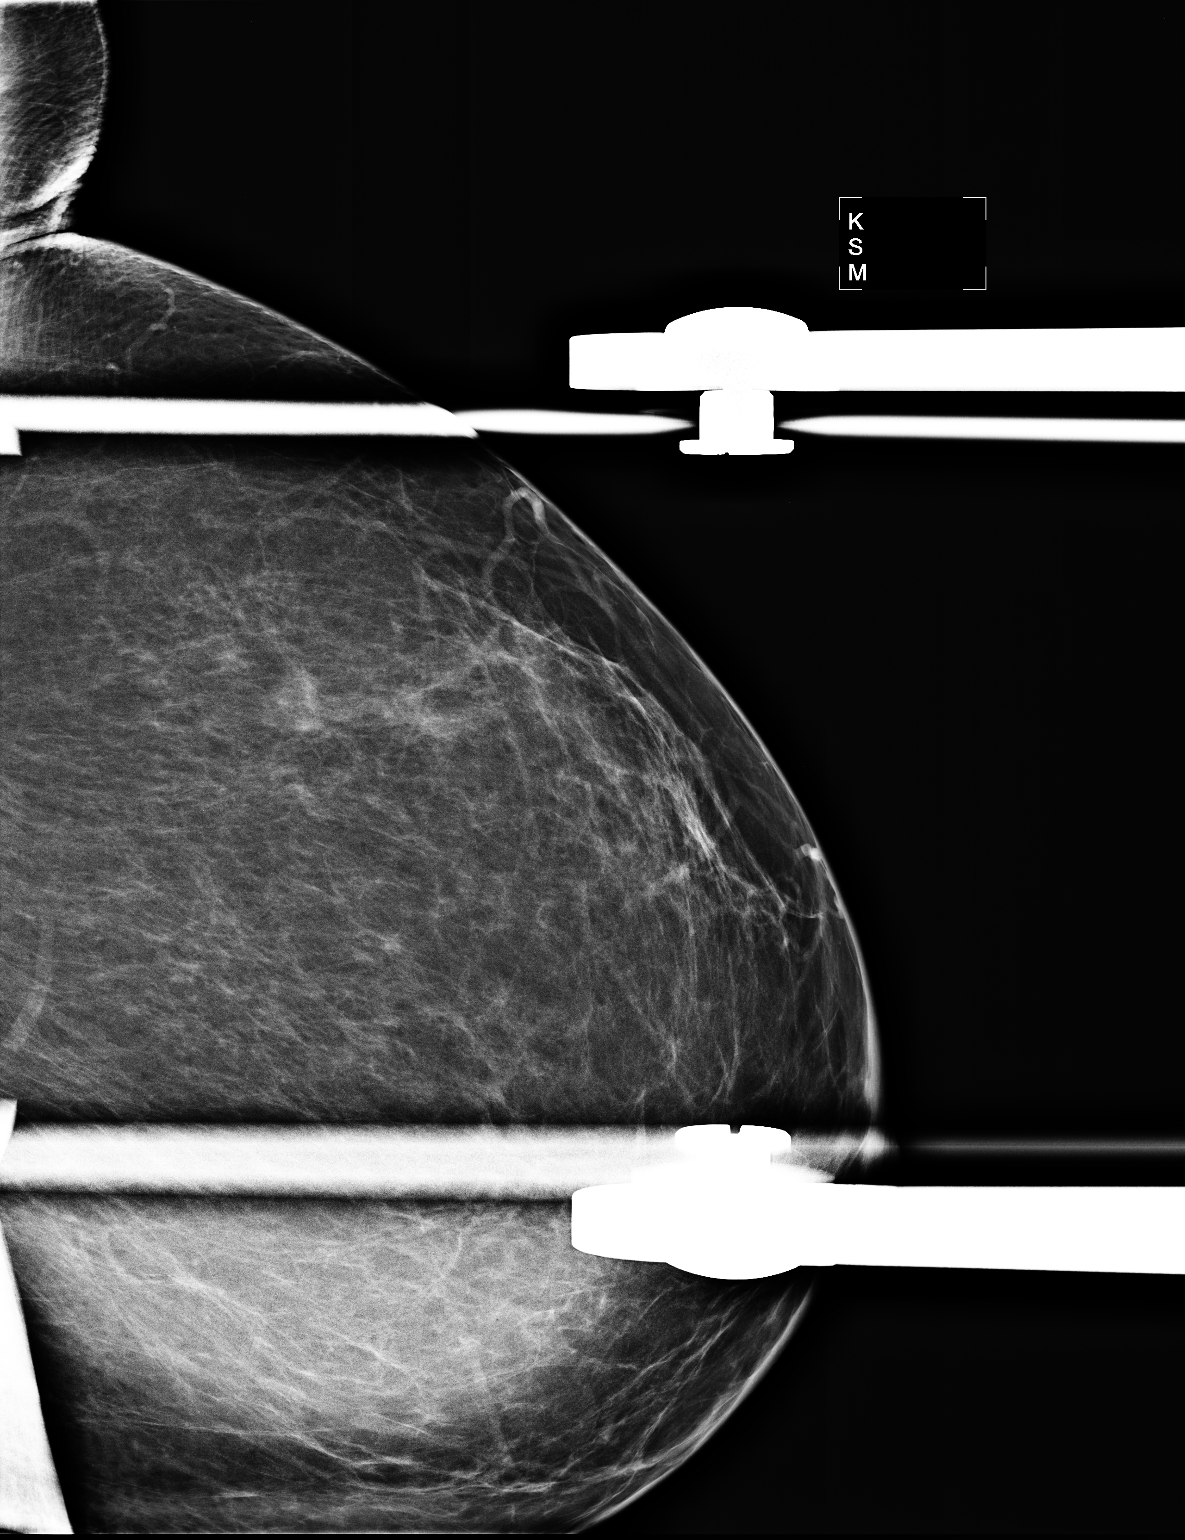

[2 of 2 positions shown; findings below may reference images not displayed]

FINDINGS: Persistent rounded asymmetry measuring about 5 mm
persists in the 3 o'clock position of the left breast but with no
architectural distortion or definitive mass.  This rounded focus
contains a lucent area along its margin.  Mammographically, it
appears compatible with either fibroglandular tissue island or
intramammary lymph node.

On physical exam, there are no suspicious abnormalities appear

Ultrasound is performed, showing no abnormalities in the area of
interest.
IMPRESSION: The asymmetry in question likely represents an island of
fibroglandular tissue or intramammary lymph node.

BI-RADS CATEGORY 3:  Probably benign finding(s) - short interval
follow-up suggested.

Recommendation:

Diagnostic left mammogram in 6 months.

## 2010-03-12 ENCOUNTER — Ambulatory Visit: Payer: Self-pay | Admitting: "Endocrinology

## 2010-04-19 ENCOUNTER — Ambulatory Visit: Payer: Self-pay | Admitting: Internal Medicine

## 2010-05-24 ENCOUNTER — Encounter: Admission: RE | Admit: 2010-05-24 | Discharge: 2010-05-24 | Payer: Self-pay | Admitting: Gynecology

## 2010-05-24 IMAGING — MG MM DIGITAL DIAGNOSTIC UNILAT L
2 series · 2 of 2 positions shown · non-contrast
Comparison: [DATE] and [DATE]

CLINICAL DATA: Follow-up left breast nodularity

DIGITAL DIAGNOSTIC LEFT MAMMOGRAM

[L CC]
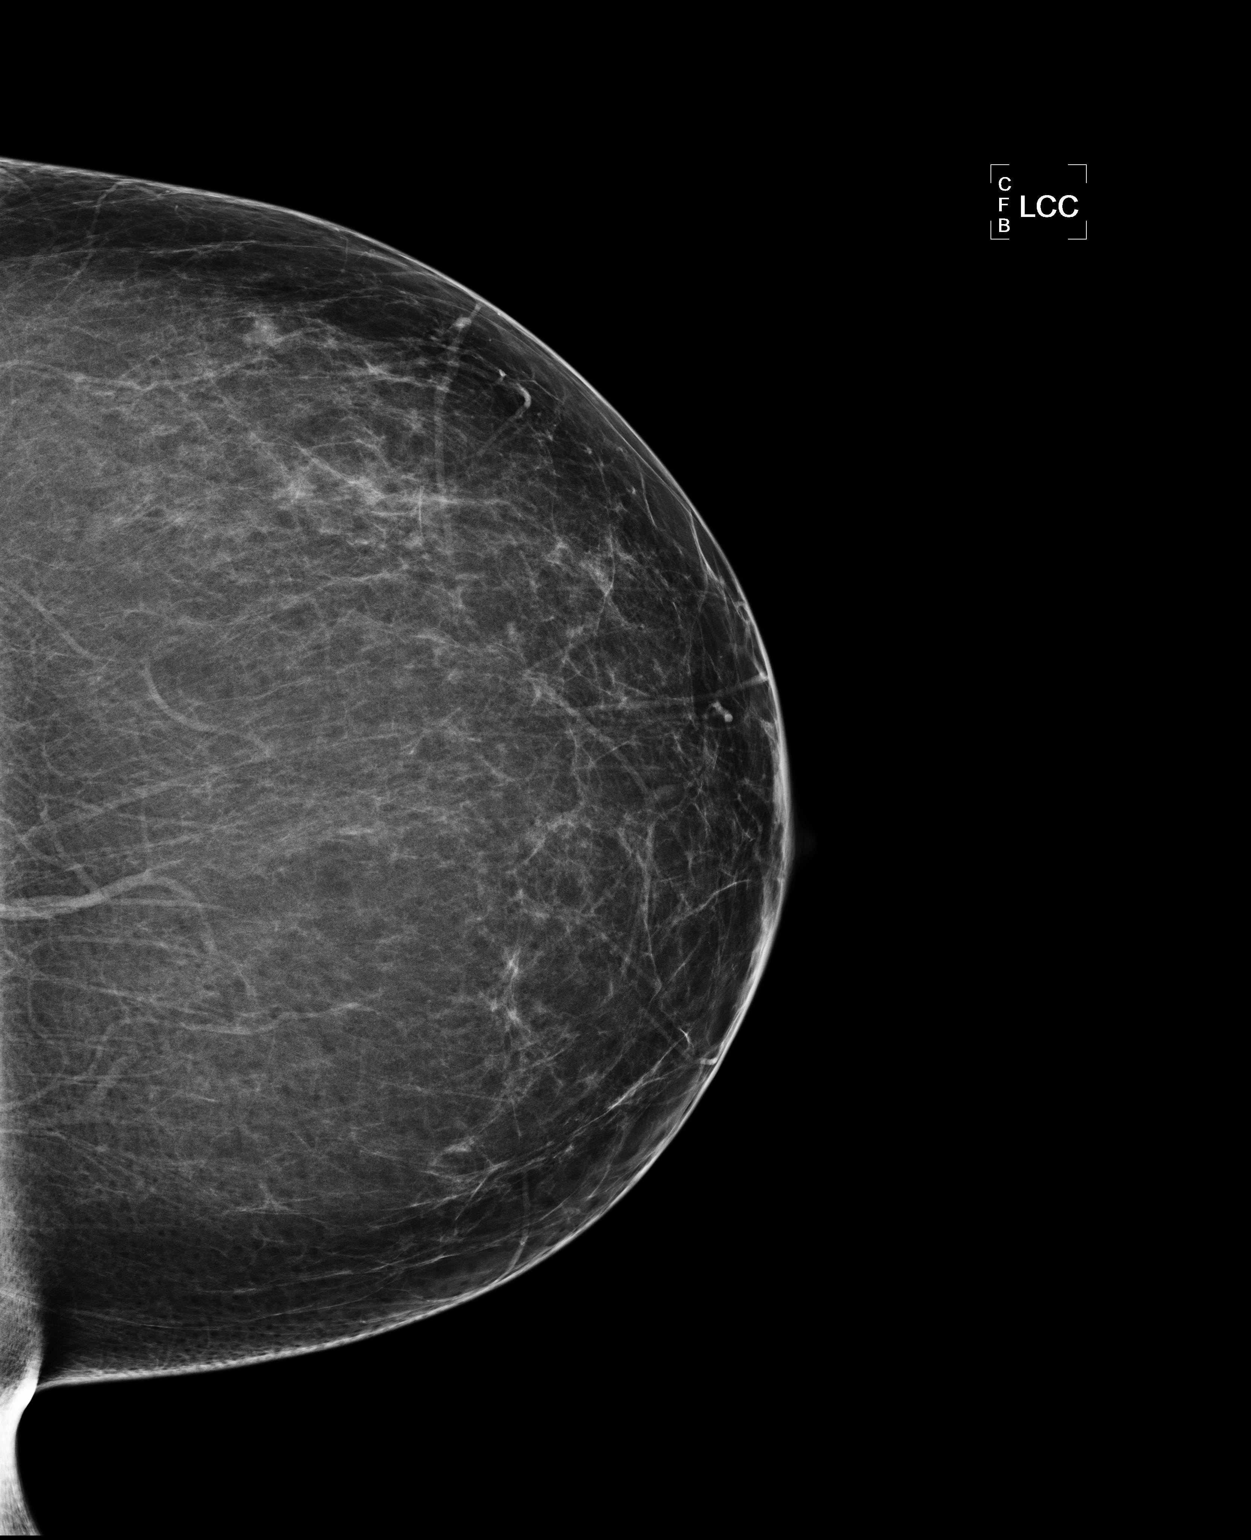

[L MLO]
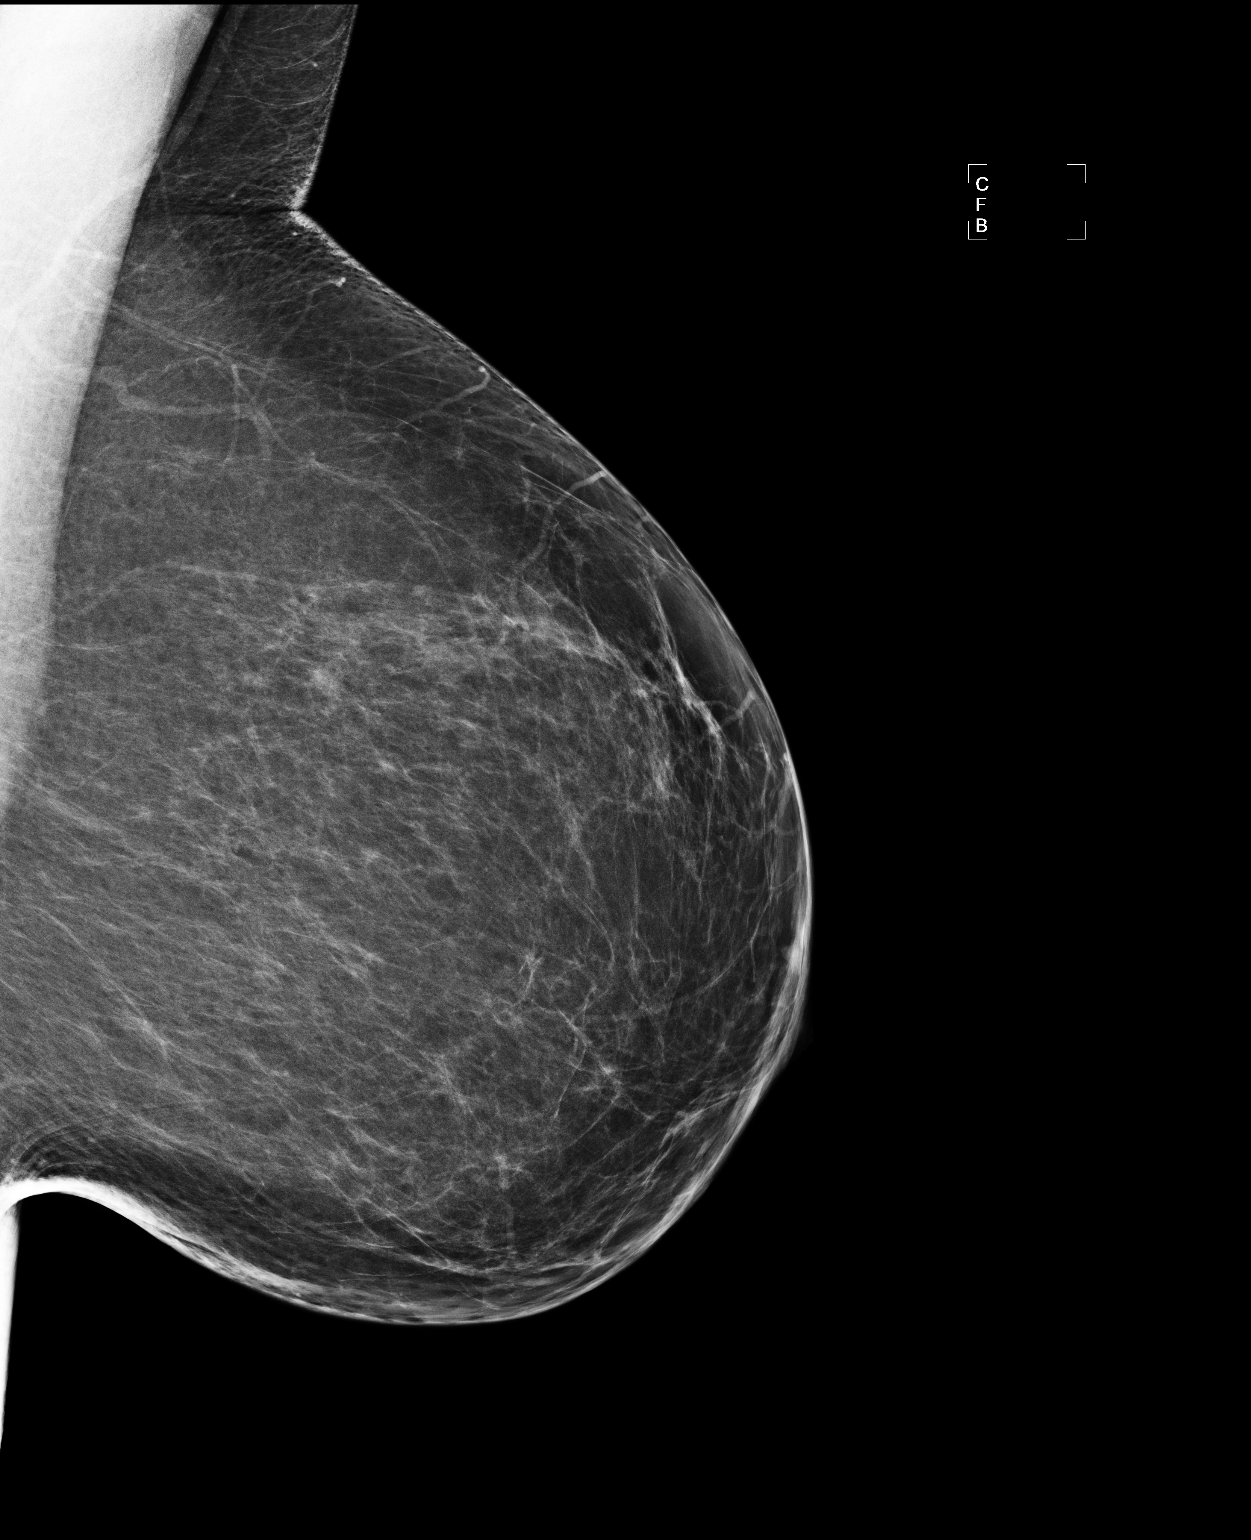

[2 of 2 positions shown; findings below may reference images not displayed]

FINDINGS: The scattered fibroglandular densities are stable
throughout the left breast.  The 4 mm lateral left breast density
has decreased in size and has the appearance of a normal
intramammary lymph node.  There are no suspicious calcifications or
distortion within the left breast.
IMPRESSION: Slight interval decrease in the size of the benign-appearing
density in the lateral left breast, likely an intramammary lymph
node.  There is no radiographic evidence of malignancy on the left.
Bilateral screening mammogram in [DATE] recommended.

BI-RADS CATEGORY 2:  Benign finding(s).

## 2010-07-12 ENCOUNTER — Ambulatory Visit: Payer: Self-pay | Admitting: "Endocrinology

## 2010-08-10 ENCOUNTER — Ambulatory Visit: Payer: Self-pay | Admitting: Internal Medicine

## 2010-10-15 ENCOUNTER — Ambulatory Visit: Payer: Self-pay | Admitting: Internal Medicine

## 2010-11-20 ENCOUNTER — Ambulatory Visit: Payer: Self-pay | Admitting: Internal Medicine

## 2010-12-03 ENCOUNTER — Encounter: Admission: RE | Admit: 2010-12-03 | Discharge: 2010-12-03 | Payer: Self-pay | Admitting: Gynecology

## 2010-12-03 IMAGING — MG MM DIGITAL SCREENING
5 series · 5 of 5 positions shown · non-contrast
Comparison: none

DG SCREEN MAMMOGRAM BILATERAL
Bilateral CC and MLO view(s) were taken.
Technologist: LILL-TONE

DIGITAL SCREENING MAMMOGRAM WITH CAD:
The breast tissue is almost entirely fatty.  No masses or malignant type calcifications are 
identified.  Compared with prior studies.
Images were processed with CAD.

[R CC (1 of 2)]
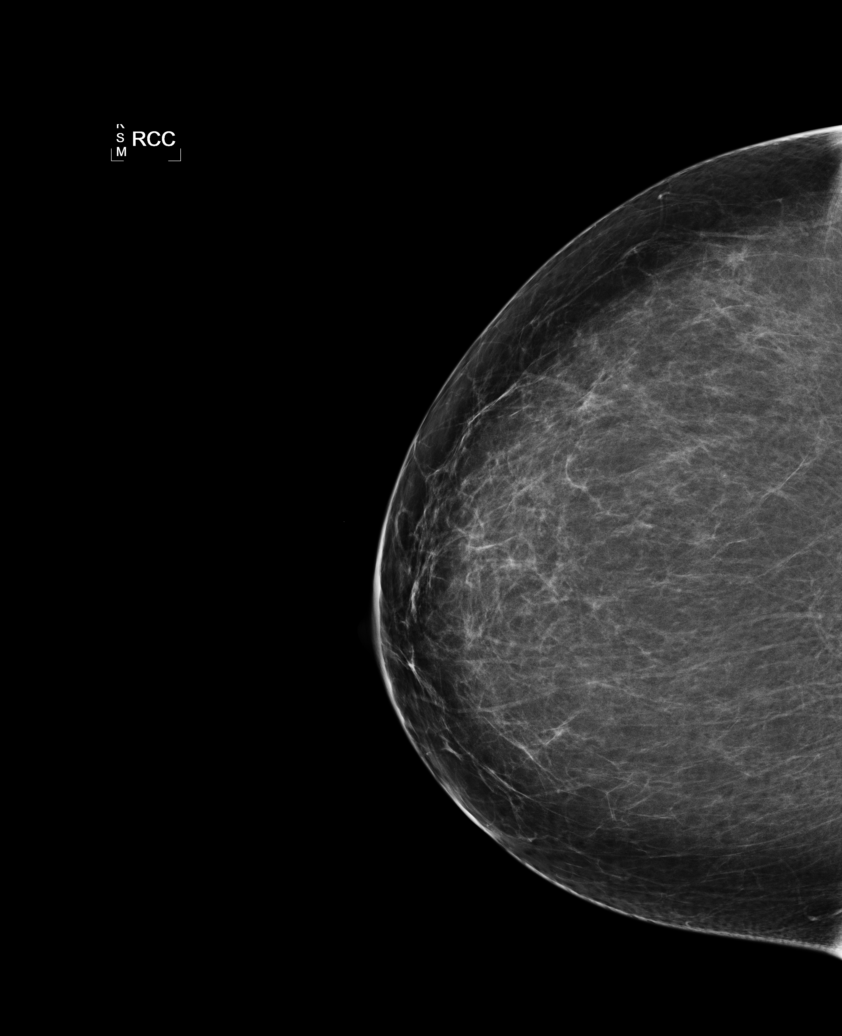

[L CC]
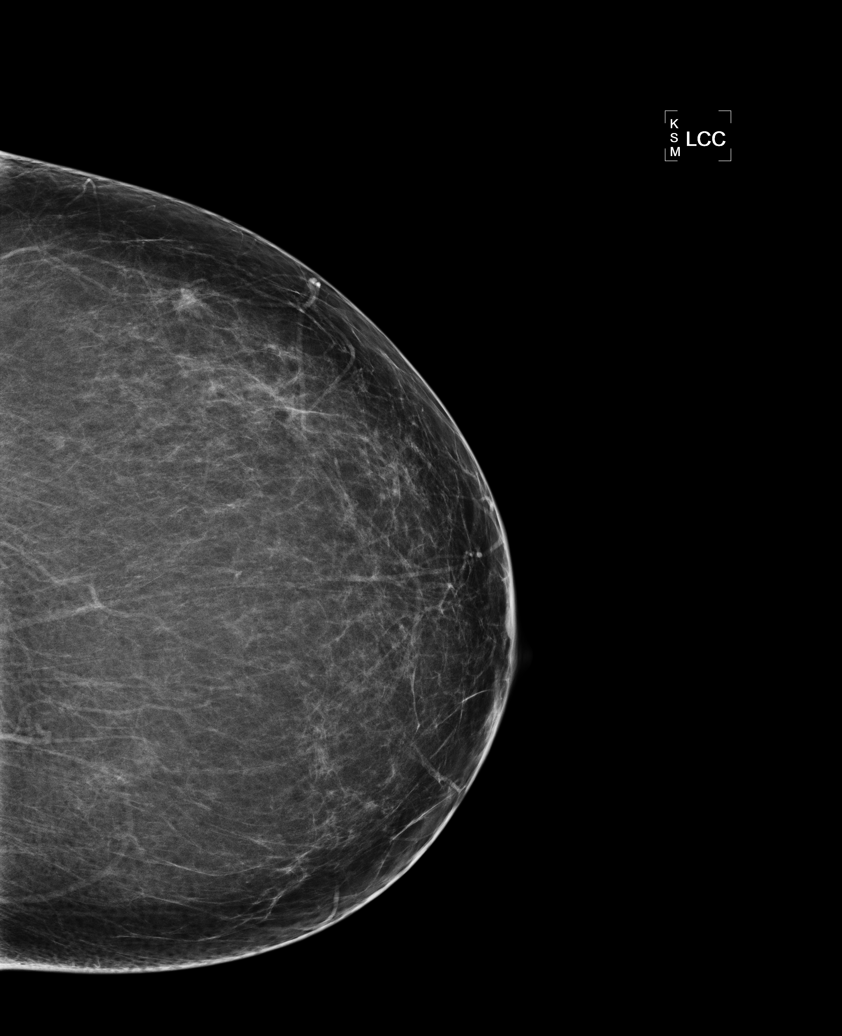

[L MLO]
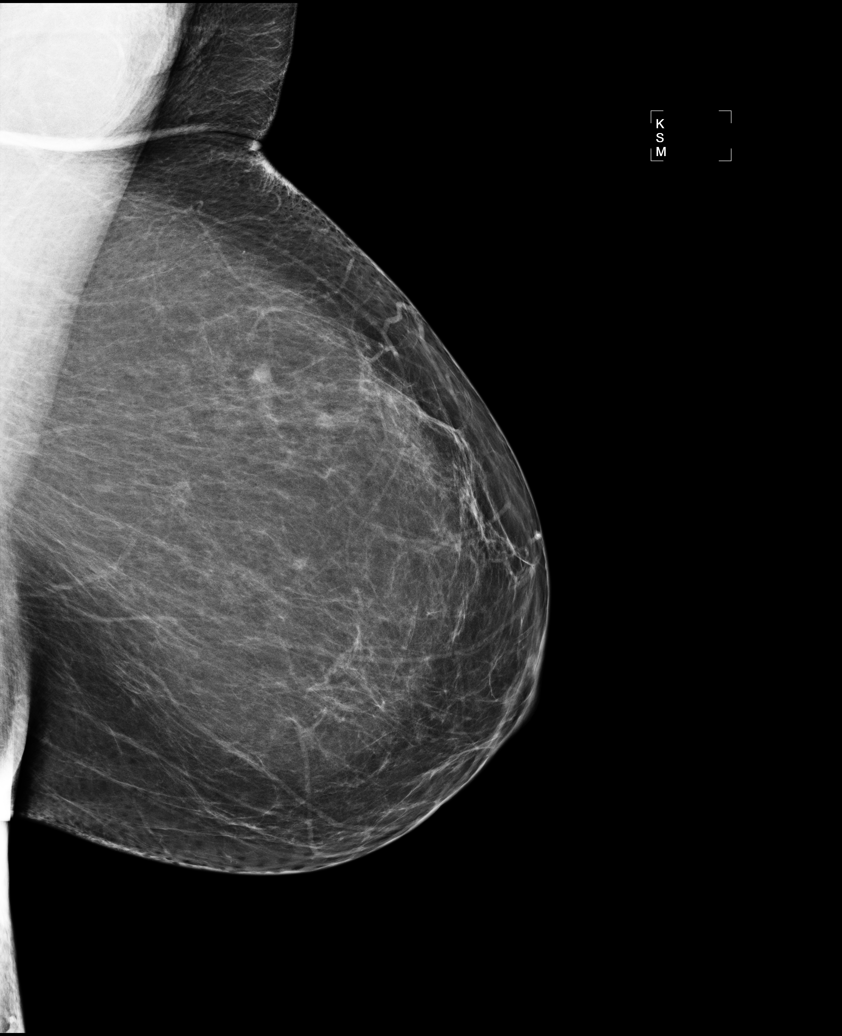

[R MLO]
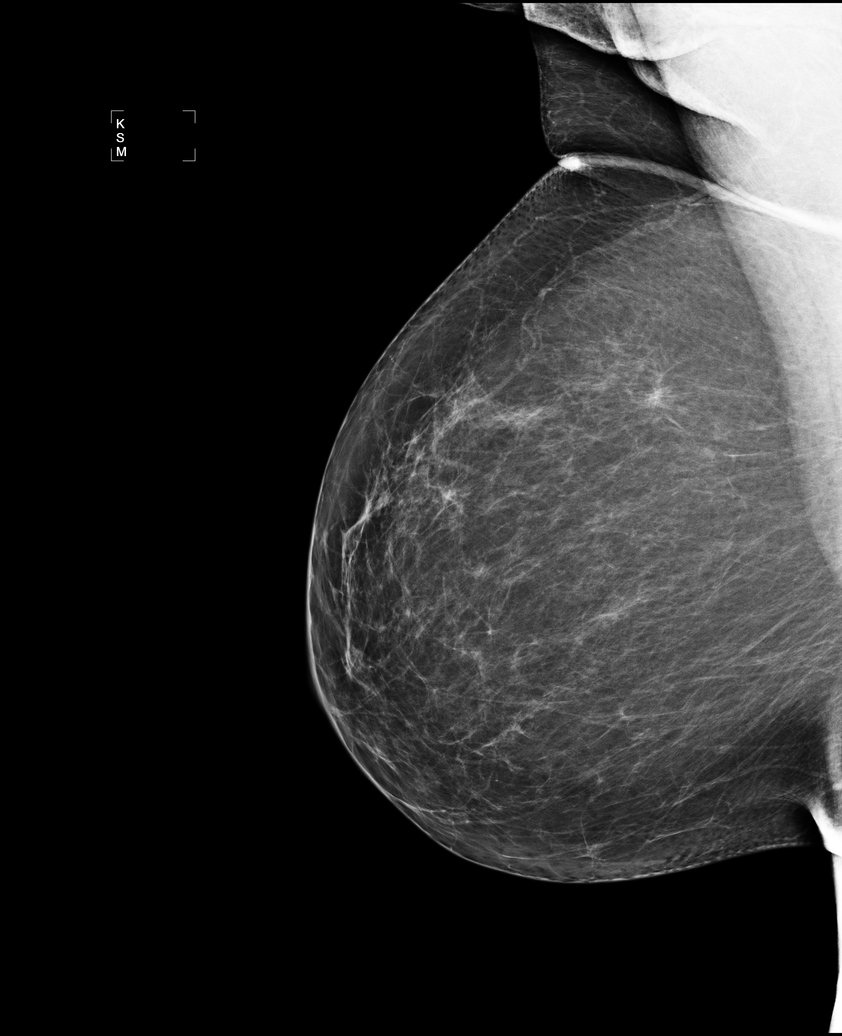

[R CC (2 of 2)]
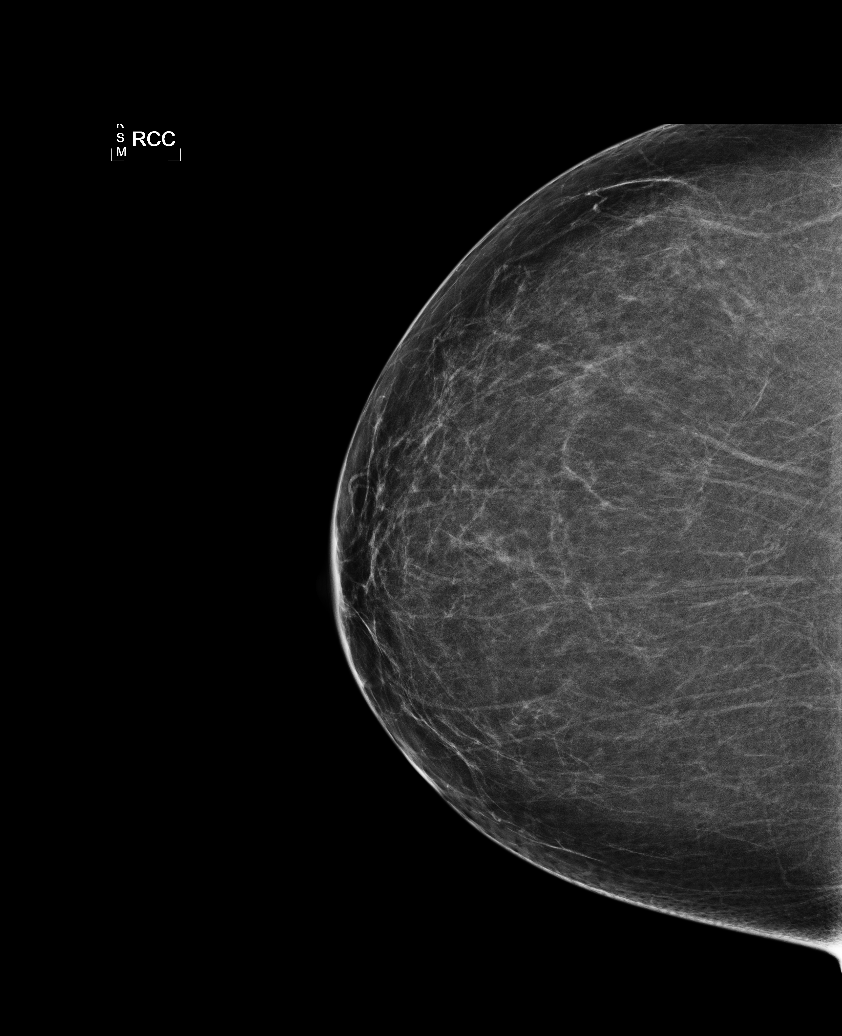

[5 of 5 positions shown; findings below may reference images not displayed]

IMPRESSION: No specific mammographic evidence of malignancy.  Next screening mammogram is recommended in one 
year.

A result letter of this screening mammogram will be mailed directly to the patient.

ASSESSMENT: Negative - BI-RADS 1

Screening mammogram in 1 year.
,

## 2011-01-29 NOTE — Procedures (Signed)
Summary: Gastroenterology COLON  Gastroenterology COLON   Imported By: Marisue Humble CMA 03/23/2008 11:10:59  _____________________________________________________________________  External Attachment:    Type:   Image     Comment:   External Document

## 2011-01-29 NOTE — Procedures (Signed)
Summary: Gastroenterology EGD  Gastroenterology EGD   Imported By: Marisue Humble CMA 03/23/2008 11:09:51  _____________________________________________________________________  External Attachment:    Type:   Image     Comment:   External Document

## 2011-02-11 ENCOUNTER — Ambulatory Visit (INDEPENDENT_AMBULATORY_CARE_PROVIDER_SITE_OTHER): Payer: BC Managed Care – PPO | Admitting: "Endocrinology

## 2011-02-11 DIAGNOSIS — G909 Disorder of the autonomic nervous system, unspecified: Secondary | ICD-10-CM

## 2011-02-11 DIAGNOSIS — E1142 Type 2 diabetes mellitus with diabetic polyneuropathy: Secondary | ICD-10-CM

## 2011-02-11 DIAGNOSIS — I10 Essential (primary) hypertension: Secondary | ICD-10-CM

## 2011-02-11 DIAGNOSIS — IMO0001 Reserved for inherently not codable concepts without codable children: Secondary | ICD-10-CM

## 2011-03-15 ENCOUNTER — Ambulatory Visit: Payer: Self-pay | Admitting: Internal Medicine

## 2011-03-19 ENCOUNTER — Ambulatory Visit (INDEPENDENT_AMBULATORY_CARE_PROVIDER_SITE_OTHER): Payer: BC Managed Care – PPO | Admitting: Internal Medicine

## 2011-03-19 DIAGNOSIS — I10 Essential (primary) hypertension: Secondary | ICD-10-CM

## 2011-03-19 DIAGNOSIS — E119 Type 2 diabetes mellitus without complications: Secondary | ICD-10-CM

## 2011-03-19 DIAGNOSIS — E785 Hyperlipidemia, unspecified: Secondary | ICD-10-CM

## 2011-04-11 LAB — POCT I-STAT 4, (NA,K, GLUC, HGB,HCT)
Glucose, Bld: 120 mg/dL — ABNORMAL HIGH (ref 70–99)
HCT: 37 % (ref 36.0–46.0)
Hemoglobin: 12.6 g/dL (ref 12.0–15.0)
Potassium: 5.1 mEq/L (ref 3.5–5.1)
Sodium: 141 mEq/L (ref 135–145)

## 2011-04-11 LAB — GLUCOSE, CAPILLARY: Glucose-Capillary: 115 mg/dL — ABNORMAL HIGH (ref 70–99)

## 2011-04-30 ENCOUNTER — Encounter: Payer: Self-pay | Admitting: *Deleted

## 2011-04-30 ENCOUNTER — Other Ambulatory Visit: Payer: Self-pay | Admitting: *Deleted

## 2011-05-14 NOTE — Procedures (Signed)
CEPHALIC VEIN MAPPING   INDICATION:  Preop AVF vein mapping.   HISTORY:  ESRD.   EXAM:   The right cephalic vein is compressible.   Diameter measurements range from 0.24 cm to 0.44 cm; however, 0.32 cm to  0.44 cm in the brachium.   The left cephalic vein is compressible.   Diameter measurements range from 0.11 cm to 0.36 cm; however, 0.33 cm to  0.34 cm in the brachium.   See attached worksheet for all measurements.   IMPRESSION:  Patent bilateral cephalic veins which are of acceptable  diameter for use as a dialysis access site in the brachiums.   ___________________________________________  Nelda Severe Kellie Simmering, M.D.   AS/MEDQ  D:  01/17/2009  T:  01/17/2009  Job:  [id number]

## 2011-05-14 NOTE — Discharge Summary (Signed)
NAMERUTHANN, [name]             ACCOUNT NO.:  [id number]   MEDICAL RECORD NO.:  [id number]          PATIENT TYPE:  AMB   LOCATION:  DAY                          FACILITY:  [facility]   PHYSICIAN:  Edythe Lynn, M.D.       DATE OF BIRTH:  February 21, 1951   DATE OF ADMISSION:  07/16/2008  DATE OF DISCHARGE:  07/23/2008                               DISCHARGE SUMMARY   PRIMARY CARE PHYSICIAN:  Cresenciano Lick. Renold Genta, MD   DISCHARGE DIAGNOSIS:  For complete list of discharge diagnosis, please  refer to the previously dictated discharge summary done by Dr. Jacki Cones.   DISCHARGE MEDICATIONS:  1. Lipitor 40 mg daily.  2. Atenolol 25 mg daily.  3. Aspirin 81 mg daily.  The patient to start taking it only after the      procedure plan on Tuesday, July 26, 2008.  4. Prilosec OTC 20 mg twice a day.  5. Alendronate once a week   CONDITION ON DISCHARGE:  [name] is discharged in stable condition.  She is afebrile with normal vital signs.  Her urinary output has  stabilized at 1.5 L a day.  Her creatinine has stabilized at 4.2.  [name] is instructed to follow up on July 25, 2008, at 10 a.m. for blood  check with [name].  She will follow up on July 28, 2008, at 10 a.m.  for another blood check with [name].  She will follow up on August 01, 2008 at 4:30 p.m. for an office visit with [name].  The patient  is scheduled for ureteral stent placement on July 26, 2008, by Dr.  Alinda Money.   PROCEDURES DURING THIS ADMISSION:  1. On July 21, 2008, the patient underwent anterograde right pyelogram      with findings of right renal pelvis and collecting system      nondilated, proximal anastomoses to the ileo-ureter is patent,      contrast flows freely into the peristalsing  ileo-ureter.      According to the study, there was no major evidence for obstruction      or hydronephrosis with good flow into the right ileo-ureter.  2. On July 21, 2008, CT scan of abdomen and pelvis with findings of  patent left ureteral stent, minimal right renal pelvocaliectasis,      and small bilateral pleural effusion.   CONSULTATIONS DURING THIS ADMISSION:  The patient was seen by Dr.  Jamal Maes from Nephrology, Dr. Raynelle Bring from Urology, and Dr.  McDiarmid from Urology.   HOSPITAL COURSE:  The hospital course that covers July 15, 2008, to July 19, 2008, refer to the dictated discharge summary done by Dr. Jacki Cones.  1. For the remainder of the hospitalization, we have noticed that the      patient's renal function has remained stable.  Her creatinine has      stayed around 4.2.  The urinary output has stayed around 1.5 L.  It      is a good possibility that [name] new baseline creatinine is      now at 4.2.  [name] indicated that she does not think that the      renal biopsy will provide additional information at this point in      time.  We have explored any possibility of an obstructive process      and so far we feel that if the patient undergoes replacement of her      ureteral stent, it is very likely that any such possibility has      been completely ruled out.  2. Hypertension.  Given the fact that [name] currently cannot use      ACE inhibitors or angiotensin receptor blockers, we switched her to      atenolol 25 mg daily.  3. Diabetes.  [name] has been observed closely in the hospital      with frequent CBG checks, and she has maintained adequate control      with diet alone.  In the future, she may benefit from oral      antidiabetics - choice of which we leave it up to her primary care      physician.  4. Anemia.  [name] had a serum protein electrophoresis which did      not have any worrisome findings for multiple myeloma.  Her anemia      is probably related to her chronic renal disease and [name] is      planning outpatient treatment for [name].      Edythe Lynn, M.D.  Electronically Signed     SL/MEDQ  D:  07/23/2008  T:   07/23/2008  Job:  [id number]   cc:   Elzie Rings. Lorrene Moore, M.D.  Cresenciano Lick. Renold Genta, M.D.

## 2011-05-14 NOTE — Consult Note (Signed)
NEW PATIENT CONSULTATION   Moore, Debbie L  DOB:  Feb 05, 1951                                       01/17/2009  [id number]   The patient is a 60 year old female with chronic renal insufficiency who  has not been on hemodialysis.  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  She has had  stents in both sides and at the present time her creatinine is in the  high 2s and she is being evaluated for vascular access.  She does have a  history of diabetes, hypertension and hyperlipidemia.   PHYSICAL EXAMINATION:  Vital signs:  On physical exam blood pressure is  134/82, heart rate is 83, respirations 14.  Neck:  Carotid pulses 3+.  No audible bruits.  Extremities:  Upper extremity exam reveals 3+  brachial and radial pulses bilaterally.  Cephalic veins are borderline  on physical exam in the upper arms not adequate in the forearms.   Vein mapping was performed in the office today which reveals what  appears to be an adequate cephalic vein on the left upper arm although  it is borderline in size, varying between 0.33 and 0.30.  The right  cephalic vein is slightly larger but bifurcates in the distal upper arm  dominant vein below the antecubital areas in the basilic system.  She is  right-handed.  I think the best plan would be to create a left upper arm  fistula to see if this matures.  She understands that this may not  result in a satisfactory site for access.  She would like to wait until  March and we have scheduled that for Thursday, March 4, at [facility]  as an outpatient.  Hopefully this will provide a satisfactory site for  vascular access for this nice lady if it should become necessary.   Nelda Severe Kellie Simmering, M.D.  Electronically Signed   JDL/MEDQ  D:  01/17/2009  T:  01/18/2009  Job:  [id number]   cc:   Elzie Rings. Lorrene Reid, M.D.

## 2011-05-14 NOTE — Assessment & Plan Note (Signed)
[place] OFFICE NOTE   [name], [name]                    MRN:          [id number]  DATE:07/28/2007                            DOB:          1951/10/01    PROBLEM:  Nausea and vomiting.   REASON:  [name] has returned for scheduled followup. She continues  to complain of intermittent nausea with vomiting. This may occur several  times during the week. In between she feels perfectly well. Upper  endoscopy demonstrated minimal gastritis. Gastric emptying scan was  delayed at 60 minutes but normal at 120 minutes. She is without  abdominal pain.   On exam, pulse 88, blood pressure 116/68, weight 199.   IMPRESSION:  Intermittent nausea with vomiting. This certainly maybe  related to gastroparesis.   RECOMMENDATIONS:  Trial of Reglan 10 mg one half hour a.c. and at  bedtime.     Sandy Salaam. Deatra Ina, MD,FACG  Electronically Signed    RDK/MedQ  DD: 07/28/2007  DT: 07/28/2007  Job #: [id number]   cc:   Cresenciano Lick. Renold Genta, M.D.  Raynelle Bring, MD

## 2011-05-14 NOTE — Op Note (Signed)
[name], [name]             ACCOUNT NO.:  [id number]   MEDICAL RECORD NO.:  [id number]          PATIENT TYPE:  AMB   LOCATION:  DAY                          FACILITY:  [facility]   PHYSICIAN:  Raynelle Bring, MD      DATE OF BIRTH:  11-Aug-1951   DATE OF PROCEDURE:  06/23/2008  DATE OF DISCHARGE:                               OPERATIVE REPORT   PREOPERATIVE DIAGNOSES:  1. History of right ureteral obstruction status post ileal ureter.  2. Acute renal insufficiency with questionable left ureteral      obstruction.   POSTOPERATIVE DIAGNOSES:  1. History of right ureteral obstruction status post ileal ureter.  2. Acute renal insufficiency with questionable left ureteral      obstruction.   PROCEDURES:  1. Cystoscopy.  2. Bilateral retrograde pyelography.  3. Left ureteral stent placement.   SURGEON:  Raynelle Bring, MD   ANESTHESIA:  General.   COMPLICATIONS:  None.   INDICATIONS:  Ms. Galeski is a 60 year old female with a history of right  ureteral obstruction status post an ileal ureter replacement.  She  recently was found to have worsening renal insufficiency, and underwent  a nuclear medicine renal scan demonstrating decreased relative renal  function of the left kidney with possible left ureteral obstruction.  After a discussion regarding this study, it was decided to proceed with  the above procedures.  The potential risks, complications, and  alternative options associated with this procedure were discussed with  the patient and informed consent was obtained.   DESCRIPTION OF PROCEDURE:  The patient was taken to the operating room  and a general anesthetic was administered.  She was given preoperative  antibiotics, placed in the dorsal lithotomy position, and prepped and  draped in the usual sterile fashion.  Next a cystourethroscopy was  performed which demonstrated the left ureteral orifice to be in a normal  anatomic position.  The patient's ileal ureteral orifice  was identified  on the superior right aspect of the bladder.  There was no evidence for  bladder tumors or other mucosal abnormalities.   Attention turned to the left ureteral orifice which was intubated with a  6-French ureteral catheter and Omnipaque contrast was injected.  This  demonstrated no evidence of any ureteral or renal pelvic filling  defects, dilation, or other abnormalities.  Attention then turned to the  ileal ureter.  The cystoscope was able to be advanced into the distal  ileal ureter.  Contrast was injected through the 6-French ureteral  catheter which filled out the distal aspect of the ileal ureter.   The 0.038 Glidewire was then gradually advanced up through the ileal  ureter which was somewhat tortuous, but allowed continued proximal  placement of the ureteral catheter with further contrast injected until  the renal pelvis and entirety ileal ureter had been visualized.  There  was noted to be minimal dilation of the renal pelvis on the right side  with no evidence for any clear obstructing points.  Based on the fact  that the patient did have decreasing relative renal function on the left  side, and questionable  ureteral obstruction on her renal scan, it was  decided to place a ureteral stent on the left side to see if this would  potentially improve her renal function.  Therefore, the 8.038 sensor  guidewire was inserted up into the left renal pelvis under fluoroscopic  guidance.  A 6 x 24 double-J ureteral stent was then advanced over the  wire with a good curl noted in the renal pelvis as well as in the  bladder, and after the wire was removed the patient's bladder was  emptied, and the procedure was ended.  She tolerated the procedure well  and without complications.  She was able to be awakened and transferred  to the recovery unit in satisfactory condition.      Raynelle Bring, MD  Electronically Signed     LB/MEDQ  D:  06/23/2008  T:  06/23/2008  Job:   [id number]

## 2011-05-14 NOTE — Assessment & Plan Note (Signed)
[name] OFFICE NOTE   [name], [name]                      MRN:          [id number]  DATE:09/07/2007                            DOB:          10-26-1951    PROBLEM:  Nausea.   [name] has returned for scheduled followup.  Endoscopy on July 14, 2007 demonstrated few enlarged gastric folds.  Pathology demonstrated  chronic active gastritis.  Gastric emptying scan showed delayed emptying  at 60 minutes but normal at 120 minutes.  [name] complains of  awakening at 4:30 to 5 a.m. with nausea and sometimes a minimal amount  of vomiting.  I have recommended a trial of Reglan, but she was hesitant  to try this because of its potential side effects.  She takes over the  counter Prilosec in the morning.   PHYSICAL EXAMINATION:  VITAL SIGNS:  Pulse 72, blood pressure 110/80,  weight 200.   IMPRESSION:  Early a.m. nausea with minimal vomiting.  This could be due  to gastroparesis.  It may also be a manifestation of gastroesophageal  reflux disease.   RECOMMENDATIONS:  Trial of Zegerid 40 mg q.h.s.  Failing that I would  consider at least trying Reglan q.h.s.     Sandy Salaam. Deatra Ina, MD,FACG  Electronically Signed    RDK/MedQ  DD: 09/07/2007  DT: 09/07/2007  Job #: [id number]   cc:   Cresenciano Lick. Renold Genta, M.D.  Raynelle Bring, MD

## 2011-05-14 NOTE — Op Note (Signed)
[name], [name]             ACCOUNT NO.:  [id number]   MEDICAL RECORD NO.:  [id number]          PATIENT TYPE:  AMB   LOCATION:  SDS                          FACILITY:  [facility]   PHYSICIAN:  Nelda Severe. Kellie Simmering, M.D.  DATE OF BIRTH:  [birth year]   DATE OF PROCEDURE:  03/02/2009  DATE OF DISCHARGE:  03/02/2009                               OPERATIVE REPORT   PREOPERATIVE DIAGNOSIS:  Chronic renal insufficiency, not yet on  hemodialysis.   POSTOPERATIVE DIAGNOSIS:  Chronic renal insufficiency, not yet on  hemodialysis.   OPERATION:  Exploration of left cephalic vein - upper arm with  inadequate vein for left upper arm arteriovenous fistula.   SURGEON:  Nelda Severe. Kellie Simmering, MD   FIRST ASSISTANT:  Nurse.   ANESTHESIA:  Local.   PROCEDURE:  The patient was taken to the operating room and placed in  supine position at which time the left upper extremity was prepped with  Betadine scrub and solution and draped in routine sterile manner.  After  infiltration of 1% Xylocaine with epinephrine, a transverse incision was  made in the antecubital area and antecubital vein was dissected free.  Basilic branch was quite small.  The cephalic branch was thought to be 3  mm in size based on vein mapping.  It was a very thin, friable small  vein.  It was carefully dissected free.  A small opening was made and a  Fogarty catheter was passed into the vein proximally and it would  traverse a very small tortuous cephalic vein in the upper arm and then  it met obstruction in the deltopectoral area.  We just tried gently  dilate this with heparinized saline and this was no avail, vein being  inadequate for fistula in either the left forearm or upper arm.  Vein  was then ligated.  Brief exploration of the brachial veins was then  performed to see if she would be a candidate for forearm graft in the  future and the brachial vein was quite small as well, being about 2 to  2.5 mm in size.  Having the patient  not being on dialysis at the present  time, the wound was irrigated and closed in layers with Vicryl in  subcuticular fashion.  A sterile dressing applied.  The patient was  taken to the recovery room in satisfactory condition.  The patient will  require a left upper arm graft as access in the future.      Nelda Severe Kellie Simmering, M.D.  Electronically Signed     JDL/MEDQ  D:  03/02/2009  T:  03/02/2009  Job:  [id number]

## 2011-05-14 NOTE — H&P (Signed)
[name], [name] NO.:  [id number]   MEDICAL RECORD NO.:  [id number]          PATIENT TYPE:  INP   LOCATION:  65                         FACILITY:  [facility]   PHYSICIAN:  Jana Hakim, M.D. DATE OF BIRTH:  [birth year]   DATE OF ADMISSION:  07/16/2008  DATE OF DISCHARGE:                              HISTORY & PHYSICAL   PRIMARY CARE PHYSICIAN:  Emeline General, M.D.   UROLOGIST:  Raynelle Bring, MD.   CONSULTANT ON CALL:  Reece Packer, MD.   CHIEF COMPLAINT:  Worsening BUN and creatinine.   HISTORY OF PRESENT ILLNESS:  This is a 60 year old female who was seen  in the office of Dr. Raynelle Bring, urology on the day of admission for  a postop evaluation following a left ureteral stent placement 2 weeks  ago on June 23, 2008.  She was found at that time to have a severe  elevation in her BUN and creatinine, and was referred to the hospital  for admission and further evaluation.  The patient was evaluated in the  emergency department and underwent a CT of the abdomen and pelvis with  urogram study to evaluate for possible obstruction, results of which  were negative for obstruction, but did reveal improvement in her  hydronephrosis and hydroureter.  Also the left ureteral stent and right  ureteroplasty were present and stable.  The patient does have a right  ileal ureter.   The patient denies having any nausea, vomiting, chest pain, shortness of  breath, edema, back pain, fevers or chills.  The patient does report  having chronic loose stools.  She does report keeping up with her intake  of fluids and liquids.   PAST MEDICAL HISTORY:  1. Significant for type 2 diabetes mellitus.  2. Hypertension.  3. Hyperlipidemia.  4. History of a previous right lower extremity deep venous thrombosis      and Coumadin therapy in the past.  5. History of Hashimoto's thyroiditis.  6. Varicose veins.  7. Chronic renal insufficiency.  8. History of right  ureteropelvic junction obstruction status post      creation of an ileal ureter October 2007 and right ureteral stent      placement October 2007.  Previous right-sided hydronephrosis July      2007.   PAST SURGICAL HISTORY:  1. Status post ileal ureter reconstruction and ureteral stent      placements in the past,  October 2007.  2. Previous ureteral stent placements, status post right ureteral      stent placement February 2008 and left ureteral stent placement      June 2009.  3. The patient also has a history of a ganglion cyst removal.  4. C. section.  5. D&C in the past.   MEDICATIONS:  1. Furosemide 20 mg 1 p.o. daily.  2. Cozaar 25 mg 1 p.o. daily.  3. Lipitor 80 mg 1 p.o. daily.  4. Fosamax 70 mg 1 p.o. q. week.  5. Aspirin 81 mg 1 p.o. daily.  6. Omeprazole 20 mg 2 tablets p.o. daily.  7. Janumet 50/500 1 tablet p.o.  b.i.d.  8. Potassium chloride 10 mEq 1 p.o. daily.   ALLERGIES/INTOLERANCES:  1. SULFA causes a rash.  2. ORANGE JUICE causes flu-like symptoms.  3. INSULIN causes severe headaches.   SOCIAL HISTORY:  The patient is married.  She is a nonsmoker,  nondrinker.   FAMILY HISTORY:  Positive for coronary artery disease.   REVIEW OF SYSTEMS:  Pertinents are mentioned above.   PHYSICAL EXAMINATION:  GENERAL:  This is an obese 60 year old female in  no discomfort or acute distress currently.  VITAL SIGNS:  Temperature 98.4, blood pressure 128/79, heart rate 96,  respirations 18, O2 saturations 100% on room air.  HEENT:  Normocephalic, atraumatic.  There is no scleral icterus.  Pupils  are equally round and reactive to light.  Extraocular movements are  intact.  Funduscopic benign. Oropharynx is clear.  NECK:  Supple with full range of motion.  No thyromegaly, adenopathy or  jugular venous distention.  CARDIOVASCULAR:  Regular rate and rhythm.  No murmurs, gallops or rubs.  LUNGS:  Clear to auscultation bilaterally.  ABDOMEN:  Positive bowel sounds, soft,  nontender, nondistended.  BACK:  No costovertebral angle tenderness.  EXTREMITIES:  Without cyanosis, clubbing or edema.  NEUROLOGIC:  Alert and oriented x3.  There are no focal deficits.   LABORATORY STUDIES:  White blood cell count 10.5, hemoglobin 10.6,  hematocrit 32.2, platelets 531, neutrophils 67%, lymphocytes 20%, sodium  137, potassium 4.6, chloride 116, bicarb 12, BUN 72, creatinine 5.95 and  glucose 94.   DIAGNOSTICS:  CT scan of the abdomen and pelvis are mentioned above.   ASSESSMENT:  A 60 year old female being admitted with:  1. Worsening acute renal failure superimposed with chronic renal      insufficiency.  2. Type 2 diabetes mellitus.  3. Hypertension.  4. Mild anemia.   PLAN:  The patient will be admitted to the telemetry area for cardiac  monitoring.  Gentle IV fluids have been ordered for rehydration therapy.  Her medications have been reviewed and her metformin derivative, Cozaar,  Lasix therapy and potassium therapy will be held secondary to her  elevated BUN and creatinine.  The patient will be  placed on Januvia for elevated blood sugars and blood glucose checks  will be performed prior to each meal.  Insulin coverage will not be  given for now secondary to the patient's reported intolerance.  DVT and  GI prophylaxis have also been ordered.  Urology is also consulted and  following the patient.      Jana Hakim, M.D.  Electronically Signed     HJ/MEDQ  D:  07/16/2008  T:  07/16/2008  Job:  [id number]   cc:   Cresenciano Lick. Renold Genta, M.D.

## 2011-05-14 NOTE — Discharge Summary (Signed)
[name], [name]             ACCOUNT NO.:  [id number]   MEDICAL RECORD NO.:  [id number]          PATIENT TYPE:  INP   LOCATION:  [id number]                         FACILITY:  [facility]   PHYSICIAN:  Sharlet Salina, M.D.   DATE OF BIRTH:  Mar 13, 1951   DATE OF ADMISSION:  07/15/2008  DATE OF DISCHARGE:                               DISCHARGE SUMMARY   DISCHARGE DATE:  To be determined.   DISCHARGE DIAGNOSES:  1. Acute on chronic renal failure.  2. Diabetes.  3. Anemia.  4. Hypertension.  5. Bilateral hydronephrosis.  6. Hypertension.  7. Hyperlipidemia.  8. History of previous right lower extremity DVT on Coumadin therapy      in the past.  9. History of Hashimoto's thyroiditis.  10.Varicose vein.  11.History of right ureteropelvic junction obstruction status post      creation of an ileoureter in October 2007 and the right urethral      stent placement October 2007.  Previous right side hydronephrosis      on July 2007.  12.Hypokalemia.   PAST SURGICAL HISTORY:  1. Status post ileoureter reconstruction and urethral stent placement      in past October 2007.  Previous urethral stent placement status      post right urethral stent placement February 2008 and left ureteral      stent placement in June 2009.  2. History of ganglion cyst.  3. C-section.  4. D&C in the past.   DISCHARGE MEDICATIONS:  To be determined at time of discharge.   FOLLOW-UP APPOINTMENTS:  To be made at the time of discharge.   PROCEDURES:  None.   CONSULTANTS:  Urology and nephrology.   HISTORY OF PRESENT ILLNESS:  The patient is a 60 year old female who was  seen in the office of Dr. Raynelle Bring, neurology, postoperative  evaluation of left urethral stent placement.  Was found to have elevated  BUN and creatinine and was sent to the emergency room for further  evaluation.  Please see admission note for further history.  Past  medical history, family history, social history, medications,  allergies  as per admission H&P.   PHYSICAL EXAMINATION ON DISCHARGE:  Temperature 98.4, pulse 65,  respirations 20, blood pressure 148/86, pulse oximetry 97% on room air.  CBG 118.  HEENT:  Normocephalic, atraumatic.  Pupils reactive to light.  Throat  without erythema.  CARDIOVASCULAR:  Regular rate and rhythm.  LUNGS:  Clear bilaterally.  ABDOMEN:  Positive bowel sounds.  EXTREMITIES:  No edema.   HOSPITAL COURSE:  1. Acute and chronic renal failure:  The patient was admitted to the      hospital, given IV fluids.  Urology and renal was consulted.      Urology did not think that the renal insufficiency was secondary to      the hydronephrosis seen on the CAT scan and did not think the      patient had any significant obstruction.  Her creatinine was      monitored, and creatinine continued to improve with good urine      output.  Per nephrology, will continue  to monitor urine creatinine      on an outpatient basis to see if that improves.  2. Diabetes:  The patient is not able to take metformin secondary to      creatinine being elevated.  Will continue her.  She is presently      getting sliding scale insulin for her diabetes which is fairly      controlled.  The patient probably most likely can be discharged on      Glucotrol XL 2.5 or 5 mg daily.  3. Urinary tract infection:  The patient was thought to have a urinary      tract infection and was on Rocephin IV, but urine cultures came      back negative and the Rocephin was discontinued.  4. Hypertension:  The patient does have a history of hypertension.      Cozaar was discontinued secondary to the renal failure.  Will start      the patient on low-dose beta blocker, atenolol 25 mg daily, for her      blood pressure, and also she did have 7 beats of V-tach.  5. Anemia:  The patient did have significant anemia and was transfused      1 unit of blood.  Her hemoglobin today is stable.  Will continue to      monitor.  6.  Hydronephrosis.  She will follow up with urology for her      hydronephrosis.      Sharlet Salina, M.D.  Electronically Signed     NJ/MEDQ  D:  07/19/2008  T:  07/19/2008  Job:  [id number]

## 2011-05-14 NOTE — Consult Note (Signed)
NAMECINTHIA, Moore NO.:  [id number]   MEDICAL RECORD NO.:  [id number]          PATIENT TYPE:  INP   LOCATION:  [id number]                         FACILITY:  [facility]   PHYSICIAN:  Sol Blazing, M.D.DATE OF BIRTH:  10-22-1951   DATE OF CONSULTATION:  07/16/2008  DATE OF DISCHARGE:                                 CONSULTATION   HISTORY:  This is a 60 year old white female who presented in 2007 with  right-sided hydronephrosis due to severe UPJ obstruction.  She initially  underwent right-sided stent placement and then reconstructive surgery  that year for severe obstruction.  This was done using a segment of  ileum to replace the resected ureter.  The patient did well.  The  patient's creatinine was 1.5-1.8 at that time and she had a history of  diabetes, but not for a long time.  The patient did well.  In January  2008, she was here with acute renal failure and dehydration, creatinine  was up to 1.8.  The renogram done at that time showed preserved function  of the right kidney.  Due to concern that ileal conduit may have been  causing resorption of creatinine and metabolic acidosis, a right  ureteral stent was placed.  Presumably it was removed later.   She was then seen in 2008 as an outpatient for episodic nausea and  vomiting by GI service and this was treated as GERD and/or  gastroparesis.  On June 21, 2008, a renogram showed poor function and  clearance of the tracer from the left kidney, which was a new finding,  and also if there is an interval increase in cortical retention on the  right kidney suggesting partial obstruction.  The left function was 42%  with 58% on the right.  A bilateral retrograde pyelography was done.  On  the left, there was no site of obstruction seen and the right system was  intubated and visualized up to the renal pelvis without any clear  obstruction.  Due to the abnormal renogram findings, a stent was placed  on the left to see  if that would improve the function on the left.  The  creatinine was 2.9 at that time.  The patient now returned for  evaluation and was found to have a creatinine of 5.9 today.  She was  admitted for further evaluation and treatment.   The patient does state that her blood pressure has been lower than usual  over the last year or so, since she lost about 80 pounds intentionally.  She said she had blood pressures in the 90s to 100 range at times and is  still taking Cozaar and Lasix.  Other than that she has no history of  over-the-counter NSAID use and has not recently received any IV contrast  or the nephrotoxins.  She denies any nausea, vomiting, abdominal pain,  or difficulty voiding.   PAST MEDICAL HISTORY:  1. Right hydronephrosis with ileal ureteroplasty on 2007 due to UPJ      stricture.  2. Chronic kidney disease, baseline 1.5-1.8.  3. Diabetes mellitus, less than 5 years duration.  No retinopathy      according to the patient.  4. History of hypertension, less than 5 years duration.  5. History of thyroiditis.   PAST SURGICAL HISTORY:  C-section, ileal ureter D&C.   HOME MEDICATIONS:  1. Cozaar 25 mg daily.  2. Lasix 20 a day.  3. Januvia and metformin b.i.d.  4. Lipitor.  5. Aspirin.  6. Alendronate.  7. Potassium citrate 10 mEq b.i.d.  8. Prilosec.   MEDICATIONS:  Here, Januvia 50 mg b.i.d., Protonix, aspirin, Lovenox, IV  sodium bicarbonate at 150 mL an hour, and p.r.n. Dilaudid.   SOCIAL HISTORY:  No smoking or tobacco use.   REVIEW OF SYSTEMS:  GENERAL:  Denies fever, chills, sweats, hearing  loss, or visual change.  She has had some lightheadedness with standing  over the past year.  She denies any sore throat, difficulty swallowing,  chest pain, shortness of breath, chest pressure, or productive cough.  She denies any significant abdominal pain, nausea, vomiting, history of  liver problems, or other stomach problems.  She denies any voiding  difficulty,  dysuria, urinary frequency, or history of significant UTIs.  She said that she has had one UTI in the past.  Denies any history of  arthritis, joint pain, or swelling.  NEUROLOGIC:  Denies any history of  stroke, TIA, or seizure.  Denies history of heat or cold intolerance or  thyroid disease.  Diabetes as above.  Denies any history of depression  or anxiety.   PHYSICAL EXAMINATION:  VITAL SIGNS:  Blood pressure 106/50-120/70,  afebrile, heart rate 96, and respirations 12.  GENERAL:  The patient is alert, pleasant white female in no acute  distress.  She is conversant, lying at 30 degrees, and comfortable.  SKIN:  Warm and dry without rash, edema, or cyanosis.  HEENT:  PERRLA.  EOM.  Throat is clear and moist.  NECK:  Supple with flat neck veins.  LUNGS:  Clear throughout.  CARDIOVASCULAR:  Regular rate and rhythm without murmur, rub, or heave.  There are no carotid or femoral bruits.  ABDOMEN:  Soft.  No ascites, no bruits, and no organomegaly.  EXTREMITIES:  No peripheral edema.  Good pedal pulses 2+ bilaterally.  NEUROLOGIC:  Alert and oriented x3.  No asterixis.   LABORATORY:  Sodium 138, potassium 4.0, CO2 12, BUN 71, and creatinine  5.90.  Urinalysis 10-20 white blood cells, 20-50 red blood cells,  occasional bacteria, 100 protein, calcium 8.9, hemoglobin 10.6, white  blood count 10,000, and platelets normal.  CT abdomen, mild bilateral  hydronephrosis and hydroureter, also right ileoureteral anastomosis  along the dome of the bladder.  No obstructing mass or stone is seen.   IMPRESSION:  Subacute renal failure in a patient with a previously  outlined urologic history.  Creatinine was 1.5 in 2007,  2.9 two weeks  ago and 5.9 today.  She had a right uteroplasty with ileal ureter in  2007.  As above, two weeks ago she had a stent placement in the left,  although there were no signs of obstruction. This was done since the  renogram showed a decreasing function of the left kidney.   She also has  a non-anion gap acidosis, likely due to ileal resorption of urinary  solute.  The patient has diabetes, but I don't think this is diabetic  nephropathy with a low A1c less than 6% and less than 5-year history of  diabetes and no retinopathy.  The patient has hypertension, but also is  unlikely to have hypertensive nephrosclerosis with less than 5 years  duration.  She could have glomerulonephritis; she does have microscopic  hematuria, and we will look for red blood cell casts and check 24-hour  urine protein for other glomerular disease.  These cells may certainly  be due to the ileal conduit, also, however.  Obstruction may be playing  a role.  Relative hypotension, use of ARB and volume depletion is  probably playing a role and we need to treat these and see if the  creatinine improves with aggressive hydration and holding blood pressure  meds and diuretics.   RECOMMENDATIONS:  1. Agree with stopping ARB and avoiding in the future for this      patient.  2. Aggressive volume repletion.  3. Send urine off for red blood cell cast evaluation.  4. Urine protein to creatinine ratio, and C3 and C4.  5. On appropriate fluids, IV sodium bicarb for her non-anion gap      metabolic acidosis.  6. If the patient's creatinine is still no better with above measures,      would considering percutaneous nephrostomy unilaterally on the      right or bilaterally.      Sol Blazing, M.D.  Electronically Signed     RDS/MEDQ  D:  07/16/2008  T:  07/17/2008  Job:  [id number]

## 2011-05-14 NOTE — Assessment & Plan Note (Signed)
[facility] OFFICE NOTE   Debbie, Moore                    MRN:          [id number]  DATE:06/30/2007                            DOB:          [birth year]    REFERRING PHYSICIAN:  Cresenciano Lick. Baxley, M.D.   OFFICE CONSULT NOTE   PROBLEM:  Nausea with intermittent vomiting.   HISTORY:  [name] is a pleasant 60 year old white female with a history  of hypertension, adult onset diabetes on medication x3 to 4 years, oral  agents only.  She had undergone a right ureteral reconstruction  procedure in October 2007 and then required a ureteral stent I believe  in February 2008.  Postoperatively she did develop a DVT and had been on  Coumadin which has now been discontinued.  The patient had the  reconstructive procedure done with a resection of a portion of her  ileum.  She has been having problems with nausea now since sometime  around her original surgery in October 2007.  This is an intermittent  sensation occurring 2 to 3 times per week.  She says she may go 3 to 4  days in a row without any nausea and sometimes an entire week without  any nausea, but it always comes back.  She wonders if it is related to  her surgery.  She has had long term problems with acid reflux and has  been on Prilosec long term as well, currently OTC Prilosec.  She says  she does not have any regular heartburn or indigestion as long as she  takes the Prilosec, has no dysphagia or odynophagia. She has no  complaints of abdominal pain and does not have any abdominal pain with  her episodes of nausea.  Her bowel movements have been normal.  She has  no melena or hematochezia.  She is not certain that her nausea is  aggravated by eating.  She says she frequently wakes up in the morning  with a sensation of nausea and then refluxes a clear fluid, once she  vomits this up she feels fine.  She says this sometimes occurs later in  the day  as well and as soon as she has the reflux event with the  vomiting the nausea dissipates.  She says if she fights the nausea, can  eat crackers etc., this will generally help as well and it resolves.  She has not been on any NSAIDs and is currently on a baby aspirin daily.   CT scan of the abdomen and pelvis had been done in December of 2007.  There was no evidence of gallstones or gallbladder wall thickening at  that time.   CURRENT MEDICATIONS:  1. Furosemide 20 mg daily.  2. Cozaar 25 q. a.m.  3. KCL 10 mEq daily.  4. Lipitor 10 daily.  5. Metformin 500 two p.o. b.i.d.  6. Prilosec OTC 1 p.o. q. a.m.  7. Aspirin 81 mg daily.  8. Fosamax plus D 70 mg weekly.   ALLERGIES:  SULFA which causes rash and ORANGE JUICE.   PAST MEDICAL HISTORY:  Adult onset diabetes mellitus on  oral agents x3  to 4 years, hypertension, dyslipidemia and ureteral reconstruction  October [id number], complicated by MRSA wound infection and DVT.   FAMILY HISTORY:  Pertinent for heart disease in her father and sister.  A grandmother with diabetes.  No family history of colon cancer or  polyps or inflammatory bowel disease.   SOCIAL HISTORY:  The patient is married.  She is employed as a Network engineer  in office support. She is a nonsmoker, nondrinker.   REVIEW OF SYSTEMS:  Pertinent for allergy, sinus symptoms.  She does get  occasional muscle cramping. GI as outlined above.  Review of systems  otherwise completely negative.   PHYSICAL EXAMINATION:  A well-developed white female in no acute  distress.  Height is 5 foot 4 inches, weight is 200, blood pressure 100/70, pulse  is 96.  HEENT:  Nontraumatic, normocephalic.  EOMI, PERRLA, sclerae anicteric.  CARDIOVASCULAR:  Regular rate and rhythm with S1, S2  No murmur, rub or  gallop.  PULMONARY:  Clear to A&P.  ABDOMEN:  Obese, soft.  She has a large right upper quadrant incisional  scar which is healing.  There is no palpable mass or hepatosplenomegaly.  No  focal tenderness.  RECTAL:  Exam not done today.   IMPRESSION:  A 60 year old white female with chronic reflux with several  month history of intermittent nausea with regurgitation.  Rule out  poorly controlled gastroesophageal reflux disease, rule out  gastroparesis.  Doubt peptic ulcer disease.   PLAN:  1. Schedule upper endoscopy.  2. Switch from Prilosec OTC to Nexium 40 mg p.o. q. a.m.  3. If EGD is negative, we will schedule gastric emptying scan.      Nicoletta Ba, PA-C  Electronically Signed      Sandy Salaam. Deatra Ina, MD,FACG  Electronically Signed   AE/MedQ  DD: 06/30/2007  DT: 07/01/2007  Job #: [id number]   cc:   Cresenciano Lick. Renold Genta, M.D.

## 2011-05-14 NOTE — Op Note (Signed)
[name], [name]             ACCOUNT NO.:  [id number]   MEDICAL RECORD NO.:  [id number]          PATIENT TYPE:  AMB   LOCATION:  DAY                          FACILITY:  [facility]   PHYSICIAN:  Raynelle Bring, MD      DATE OF BIRTH:  1951/07/05   DATE OF PROCEDURE:  09/22/2008  DATE OF DISCHARGE:  09/22/2008                               OPERATIVE REPORT   PREOPERATIVE DIAGNOSES:  1. Chronic kidney disease.  2. History of right ureteral obstruction, status post right ileal      ureter.   PROCEDURES:  1. Cystoscopy.  2. Bilateral retrograde pyelography.  3. Bilateral ureteral stent placement (right 6 x 28, left 6 x 26).   SURGEON:  Dr. Raynelle Bring.   ANESTHESIA:  General.   COMPLICATIONS:  None.   INDICATIONS:  [name] is a 60 year old female with a history of right  ureteral obstruction, status post a right ileal ureter in 2007.  She  does have a history of chronic kidney disease, but was recently noted to  have acute renal failure.  She was admitted to the hospital and  underwent an extensive evaluation by both nephrology and urology.  She  did not any findings that were really consistent with a ureteral  obstruction, but did have placement of a left ureteral stent based on a  renogram which potentially suggested possible obstruction.  However, her  creatinine did not improve following stent placement and she has been  managed with careful medical therapy by nephrology.  Although she does  not have any significant hydronephrosis, it was decided that since her  creatinine did not return to her baseline of approximately 1.8, that it  would be reasonable to replace her left ureteral stent and place a right  ureteral stent to absolutely exclude any potential obstruction.  The  potential risks, benefits, and alternative options were discussed in  detail and informed consent was obtained.   DESCRIPTION OF PROCEDURE:  The patient was taken to the operating room  and a  general anesthetic was administered.  She was given preoperative  antibiotics, placed in the dorsal lithotomy position, and prepped and  draped in the usual sterile fashion.  Next, a preoperative timeout was  performed.  Cystourethroscopy was then performed which demonstrated the  patient's left ureteral orifice to be in the normal anatomic position  with an indwelling ureteral stent.  The patient's right ileal ureter was  seen off the right posterior, superior aspect of the bladder.  The left  ureteral stent was then pulled out to the urethral meatus with a  flexible grasper and a 0.038 sensor guidewire was advanced up into the  left renal pelvis.  The wire was then replaced with a 6-French ureteral  catheter and Omnipaque was injected, which demonstrated no evidence of  hydronephrosis or other abnormalities of the collecting system.  The  guidewire was then replaced into the renal pelvis and a 6 x 26 double-J  ureteral stent was advanced over the wire using Seldinger technique.  The stent was appropriately position under fluoroscopic and cystoscopic  guidance.  The wire  was removed with a good curl noted in the renal  pelvis, as well as in the bladder.  Attention then turned to the right  ileal ureter.  A 6-French ureteral catheter was advanced into the ileal  ureter as was the tip of the cystoscope sheath.  Contrast was injected  to help outline the path of the ileal ureter and an 0.038 sensor  guidewire was gradually advanced.  The ureteral catheter was advanced  next to the wire and the wire was eventually manipulated up into the  right renal pelvis, although the ileal ureter was noted to be somewhat  tortuous and consistent with previous findings.  Once the wire was  appropriately positioned, the 6-French ureteral catheter was advanced up  in the renal pelvis and contrast again was injected.  There did not  appear to be hydronephrosis.  The wire was then replaced and a 6 x 28   double-J ureteral stent was advanced over the wire and carefully  advanced up into the renal pelvis.  It was appropriately positioned  under fluoroscopic and cystoscopic guidance and the wire was removed.  A  good curl was noted in the renal pelvis and in the bladder.  The bladder  was emptied and the procedure was ended.  She tolerated the procedure  well without complications.  She was able to be awakened and transferred  to the recovery unit in satisfactory condition.      Raynelle Bring, MD  Electronically Signed     LB/MEDQ  D:  09/22/2008  T:  09/24/2008  Job:  [id number]

## 2011-05-17 NOTE — Discharge Summary (Signed)
[name], [name]             ACCOUNT NO.:  [id number]   MEDICAL RECORD NO.:  [id number]          PATIENT TYPE:  INP   LOCATION:  [id number]                         FACILITY:  [facility]   PHYSICIAN:  Raynelle Bring, MD      DATE OF BIRTH:  1951-12-30   DATE OF ADMISSION:  01/19/2007  DATE OF DISCHARGE:  01/22/2007                               DISCHARGE SUMMARY   ADMISSION DIAGNOSES:  1. Dehydration  2. Renal insufficiency.   DISCHARGE DIAGNOSES:  1. Dehydration  2. Renal insufficiency.   HISTORY OF PRESENT ILLNESS:  [name] is a 60 year old female who is  status post a right ileal ureter reconstruction on October 22, 2006.  Overall, she has recovered well and has had all internal and external  drains removed.  A subsequent renogram demonstrated adequate function of  her right kidney along with no signs of obstruction.  She recently  presented for evaluation with nausea and vomiting.  She had been on  antibiotic therapy for a recent wound infection.  Her antibiotics were  stopped.  However, the patient continued to have nausea and vomiting and  could not tolerate any food or drink by mouth.  Due to her dehydration  and inability to tolerate p.o. intake, it was recommended that she come  in the hospital for IV fluid hydration and further evaluation.   HOSPITAL COURSE:  [name] was admitted to the hospital on January 19, 2007, and administered intravenous fluid hydration.  She underwent an  extensive evaluation to try and determine the etiology of her nausea and  vomiting which had been persistent x10 days.  There was no sign of any  infection.  Her previously drained wound was healing nicely.  On her  labs, she was noted to have an elevated creatinine of 1.6 consistent  with being dehydrated.  She was also noted to be mildly acidotic.  Her  creatinine gradually improved over the course of the next couple of days  and down to a nadir of 1.39.  However, due to the fact that she  was  diabetic and did have some renal insufficiency, it was decided not to  proceed with IV contrast for a CT scan.  She underwent an acute  abdominal series which was unremarkable.  She also underwent a renal  ultrasound which demonstrated some mild dilation of the right kidney  consistent with a refluxing ileal ureter.  On January 22, 2006, her  creatinine was noted to slightly rise to 1.5.  A nuclear medicine  renogram was obtained to exclude new obstruction of her right kidney.  There did not appear to be any obvious obstruction and there appeared to  be preserved renal function.  The patient's nausea and vomiting improved  by the evening of January 20, 2007.  The following day, she did go a  full 24 hours without emesis.  Therefore, on January 22, 2007, she was  felt to be stable for discharge with futher evaluation of her renal  insufficiency to occur as an outpatient.   DISPOSITION:  Home.   DISCHARGE MEDICATIONS:  The patient will  resume all of her regular home  medications.   DISCHARGE INSTRUCTIONS:  The patient will stay well-hydrated.   FOLLOW UP:  She will follow up earlier next week for repeat laboratory  testing to assess her renal function and electrolytes and will proceed  with further evaluation as an outpatient if necessary.           ______________________________  Raynelle Bring, MD  Electronically Signed     LB/MEDQ  D:  01/22/2007  T:  01/22/2007  Job:  [id number]   cc:   Cresenciano Lick. Renold Genta, M.D.  Fax: [phone]

## 2011-05-17 NOTE — Op Note (Signed)
NAMEDELIJAH, Moore             ACCOUNT NO.:  [id number]   MEDICAL RECORD NO.:  [id number]          PATIENT TYPE:  AMB   LOCATION:  DAY                          FACILITY:  [facility]   PHYSICIAN:  Raynelle Bring, MD      DATE OF BIRTH:  August 03, 1951   DATE OF PROCEDURE:  02/02/2007  DATE OF DISCHARGE:                               OPERATIVE REPORT   PREOPERATIVE DIAGNOSES:  1. Renal insufficiency.  2. Status post ileal ureter reconstruction.   POSTOPERATIVE DIAGNOSES:  1. Renal insufficiency.  2. Status post ileal ureter reconstruction.   PROCEDURES:  1. Cystoscopy.  2. Right retrograde pyelography.  3. Right ureteral stent placement.   SURGEON:  Raynelle Bring, MD.   ANESTHESIA:  General anesthesia.   COMPLICATIONS:  None.   RADIOLOGIC FINDINGS:  The patient's ileal ureter was able to be  opacified.  It was noted to be somewhat tortuous but without obvious  areas of obstruction.   INDICATIONS FOR PROCEDURE:  [name] is a 60 year old female who is  status post an ileal ureter.  She was recently hospitalized due to  persistent nausea and vomiting and during her evaluation was noted to  have renal insufficiency with a creatinine ranging between 1.5 to 2.  She was also noted to be acidotic.  Despite hydration, her renal  insufficiency remained.  She underwent evaluation which did not  demonstrate obstruction of her right kidney and did demonstrate renal  preservation of function.  It was felt that her increased creatinine  level could be secondary to reabsorption of urine through her ileal  loop.  It was therefore decided to place a ureteral stent to see if this  could resolve her acidosis and renal insufficiency on laboratory  findings.  Potential risks and benefits were discussed with the patient  and she consented.   DESCRIPTION OF PROCEDURE:  The patient was taken to the operating room  and a general anesthetic was administered.  She was given preoperative  antibiotics, placed in the dorsal lithotomy position and prepped and  draped in the usual sterile fashion.  Next, cystourethroscopy was  performed.  The bladder was noted to have no evidence of any tumors,  stones or other mucosal pathology.  The left ureteral orifice was in the  normal anatomic position.  There was noted to be an opening toward the  right dome in the location of the patient's ileal ureteral.  A 0.038  guidewire was then inserted after a 6 [place] open ended ureteral  catheter had been used to opacify the distal portion of the ileal  ureteral.  This allowed the wire to be advanced up to the mid distal  portion of the ureter.  At this point, the wire was unable to be  advanced any more.  Therefore, the flexible ureteroscope was advanced  next to the wire and up into the ileal ureter.  It was able to be  advanced by the wire in through the tortuous bowel segment.  The  ureteroscope was able to be advanced under direct vision all the way up  to the proximal portion of the  ileal ureter.  The renal pelvis was then  able to be visualized and there did not appear to be any obvious  strictured areas or areas worrisome for obstruction.  A 0.038 sensor  guidewire was then inserted into the renal pelvis through the  ureteroscope and the ureteroscope was removed.  An 8 x 30 double J  ureteral stent was then advanced over the wire using Seldinger technique  after the wire was back loaded over the cystoscope.  A good curl was  noted in the renal pelvis and the bladder and the wire was removed.  The  stent was then repositioned appropriately under fluoroscopy and under  cystoscopic guidance.  The patient's bladder was then emptied and the  procedure was ended.  The patient appeared to tolerate the procedure  well and without complications.  She was able to be awakened and  transferred to the recovery unit in satisfactory condition.           ______________________________  Raynelle Bring, MD  Electronically Signed     LB/MEDQ  D:  02/02/2007  T:  02/02/2007  Job:  [id number]

## 2011-05-17 NOTE — Discharge Summary (Signed)
NAMECONSANDRA, BRANUM             ACCOUNT NO.:  [id number]   MEDICAL RECORD NO.:  [id number]          PATIENT TYPE:  INP   LOCATION:  [id number]                         FACILITY:  [facility]   PHYSICIAN:  Raynelle Bring, MD      DATE OF BIRTH:  [birth year]   DATE OF ADMISSION:  12/29/2006  DATE OF DISCHARGE:  01/03/2007                               DISCHARGE SUMMARY   ADMISSION DIAGNOSIS:  Cellulitis.   DISCHARGE DIAGNOSIS:  1. Cellulitis.  2. Deep venous thrombosis.   HISTORY:  For full details please see admission history and physical.  Briefly, Ms. Devillier is a 60 year old female who is approximately 2-  months status post creation of a right ileal ureter. Her postoperative  course was initially complicated by a urine leak at her proximal  anastomosis. She was maintained with a nephrostomy tube with subsequent  resolution of her leak. She subsequently was evaluated after removal of  all drains and her right kidney appeared to be draining well and without  evidence of obstruction. On December 29, 2006, she presented with  complaints of increased tenderness over her right lateral portion of her  subcostal incision. She also had some drainage from this area. Due to  the fact that she was diabetic and that she had significant erythema,  she was admitted to the hospital and begun on IV antibiotics.   HOSPITAL COURSE:  Ms. Skirvin was admitted to the hospital on December 29, 2006. She was placed on intravenous vancomycin and a CT scan was  subsequently performed. This did not demonstrate any perinephric fluid  collection or frank abscess in the superficial tissues. Findings were  consistent with a cellulitis. She was maintained on IV vancomycin and  also Zosyn with subsequent improvement both in her pain, erythema, as  well as her leukocytosis. Her white blood count decreased from 13,000 to  WNL after 48 hours of antibiotics. Incidentally, her CT scan did  demonstrate a right lower extremity  thrombosis consistent with a DVT. A  medicine consultation was obtained and the patient was begun on Lovenox  therapy. She did subsequently undergo a VQ scan and chest x-ray which  demonstrated a very low probability of pulmonary embolus, indicating  that this was unlikely. She therefore was maintained on Lovenox therapy  and subsequent bridged to oral Coumadin. While waiting for her to become  therapeutic on her Coumadin, her wound was carefully followed. Prior to  discharge, her erythema was significantly improved but she was noted to  have an increasing area of fluctuance.  Therefore she did undergo  drainage of this fluctuant area at the bedside with expression of a  copious amount of purulent material. This was also sent for culture.  This was then packed with a wet-to-dry dressing and the family was  instructed on wound care. On January 03, 2006, the patient's INR became  therapeutic at 2.2.  In addition, she did have the final culture results  of her initial wound culture which did reveal some rare MRSA bacteria  sensitive to doxycycline.  She was therefore discharged home in improved  condition on doxycycline  antibiotic therapy, wet-to-dry normal saline  wound care, and Coumadin.   DISPOSITION:  Home.   DISCHARGE INSTRUCTIONS:  1. Wound infection: Ms. Campodonico will perform wet-to-dry normal saline      dressing changes x2-3 daily.  2. She will follow-up with me in approximately 1 week to reevaluate      her wound.  3. She will be maintained on doxycycline antibiotic therapy.  4. She has been instructed to contact me should she begin having      fever, worsening erythema, or worsening pain at her wound site.  5. Deep venous thrombosis: Ms. Rokicki appears to have an incidental      deep venous thrombosis that was asymptomatic. She does not appear      to have a pulmonary embolus. She is currently anticoagulated      therapeutically and will plan to follow-up with her primary care       physician, Dr. Tedra Senegal to monitor her INR and for adjustment in      her Coumadin.   DISCHARGE MEDICATIONS:  1. Ms. Repasky will resume her regular home medications.  2. She will be given a prescription for Coumadin. (Her INR levels will      be closely monitored at this time especially considering her      antibiotic therapy).  3. Doxycycline.   FOLLOW UP:  1. She will follow-up with me in approximately 1 week for evaluation      of her wound.  2. Ms. Campion also will contact Dr. Verlene Mayer office to schedule an      appointment to have her INR checked early next week.           ______________________________  Raynelle Bring, MD  Electronically Signed     LB/MEDQ  D:  01/03/2007  T:  01/03/2007  Job:  [id number]   cc:   Cresenciano Lick. Renold Genta, M.D.  Fax: [phone]

## 2011-05-17 NOTE — Op Note (Signed)
[name], [name]             ACCOUNT NO.:  [id number]   MEDICAL RECORD NO.:  [id number]          PATIENT TYPE:  INP   LOCATION:  0160                         FACILITY:  [facility]   PHYSICIAN:  Raynelle Bring, MD      DATE OF BIRTH:  1951/08/26   DATE OF PROCEDURE:  10/22/2006  DATE OF DISCHARGE:                                 OPERATIVE REPORT   PREOPERATIVE DIAGNOSES:  1. Hematuria.  2. Right ureteropelvic junction obstruction.   POSTOPERATIVE DIAGNOSES:  1. Hematuria.  2. Right ureteropelvic junction obstruction.   PROCEDURE:  1. Cystoscopy.  2. Right retrograde pyelography.  3. Right ureterorenoscopy.  4. Right ureteral stent placement.  5. Exploratory laparoscopy with attempted robotic-assisted laparoscopic      dismembered pyeloplasty.  6. Creation of ileal ureter.   SURGEON:  Raynelle Bring, M.D.   ASSISTANTS:  1. Rana Snare, M.D.  2. Dion Body, M.D.   ANESTHESIA:  General.   COMPLICATIONS:  None.   ESTIMATED BLOOD LOSS:  450 mL.   INTRAVENOUS FLUID:  6700 mL of lactated Ringer's.   SPECIMENS:  1. Right ureteropelvic junction.  2. Right ureter.   DRAINS:  1. A #19 Blake perinephric drain.  2. A #19 Blake perivesical drain.  3. An 8 x 28 double-J ureteral stent.  4. A 24-French Simplastic hematuria catheter.   INDICATIONS:  [name] is a 60 year old female who initially presented  with acute onset of right flank pain.  A CT scan was performed which  demonstrated no evidence of stones, but significant hydronephrosis and a  significant obstruction with a severely delayed nephrogram of the right  kidney.  She underwent further evaluation including ureteroscopy and was  found to have a severely narrowed ureteropelvic junction which could not be  bypassed with a ureteroscope.  She subsequently had a ureteral stent placed  to unobstruct her right kidney and a subsequent renogram demonstrated some  decreased relative renal function of the right kidney  with approximately 39%  to 40% for the right and 61% on the left.  She had undergone a brushing of  this area as well as further evaluation for her hematuria and did not appear  to have a malignancy.  Due to the fact that her right renal collecting  system was unable to be fully visualize or opacified during her last  procedure, it was decided to take a good look at her right renal pelvis  prior to proceeding with a ureteropelvic junction obstruction repair to  effectively rule out a malignancy.  After discussing the options for repair,  the patient did wish to proceed with a robotic-assisted laparoscopic  dismembered pyeloplasty.  Potential risks and benefits were discussed with  the patient and she consented.   On radiologic findings on right retrograde pyelography, the patient was  noted to have a mildly dilated right renal collecting system and no obvious  filling defects.  However, the lower pole of the kidney did not completely  opacify.   DESCRIPTION OF PROCEDURE:  The patient was taken to the operating room and a  general anesthetic was administered.  She  was given preoperative  antibiotics, placed in the dorsal lithotomy position, and prepped and draped  in the usual sterile fashion.  Next, a preoperative time-out was performed.  Cystourethroscopy was then performed, which demonstrated a normal urethra.  The bladder was examined and aside from the indwelling right ureteral stent,  was unremarkable.  The patient's distal end of the stent did demonstrate  some calcifications.  It was able to be grabbed with a flexible grasper and  was removed without significant difficulty under fluoroscopic guidance.  A  0.038 Sensor guidewire was then inserted up into the right renal pelvis  under fluoroscopic guidance.  The flexible ureteroscope was then advanced  over the wire and the wire was removed.  Examination of the renal pelvis did  demonstrate some stone debris from the patient's stent  encrustation.  However, there were no large fragments.  There was no evidence of any tumors  or other abnormalities in the renal collecting system.  The ureteroscope was  then withdrawn and the ureteropelvic junction was carefully examined.  The  UPJ appeared unremarkable except for being slightly narrowed.  The  ureteroscope was then withdrawn and there appeared to be no further  abnormalities in the remaining ureter.  A wire was then re-advanced up into  the renal pelvis and 8 x 26 double-J ureteral stent was advanced over the  wire and appropriately positioned under fluoroscopic and cystoscopic  guidance.  The wire was then removed with a good curl noted in the renal  pelvis well as in the bladder.  A Foley catheter was then inserted into the  bladder.    The patient was then repositioned in the right modified flank position  with care to pad all potential pressure points.  The patient's abdomen was  then prepped and draped in the usual sterile fashion.  A site was selected  just superior to the umbilicus in the midline for placement of the camera  port.  This was placed using a standard open Hasson technique allowing entry  into the peritoneal cavity under direct vision.  A 12-mm port was then  placed.  A pneumoperitoneum was established and a 0-degree lens was used to  inspect the abdomen and there was no evidence of any intra-abdominal  injuries or other abnormalities.  The remaining ports were then placed.  An  8-mm robotic port was placed just to the right of the falciform ligament,  midway between the camera port and the xiphoid.  An additional 8-mm robotic  port was placed in the right lower quadrant abdominal wall.  Another 8-mm  robotic port was placed in the far right lower quadrant and an additional 12-  mm port was placed in the midline just below the level of the umbilicus for laparoscopic assistance.  All ports were placed under direct vision and  without difficulty.  The  surgical cart was then docked.  With the 30-degree  lens, the abdomen was inspected.  There was no evidence of any intra-  abdominal injuries or other abnormalities.  The white line of Toldt was then  incised along the length of the ascending colon, allowing the colon to be  mobilized medially and the space between the anterior layer of Gerota's  fascia and the colonic mesentery to be developed.  The gonadal vein and  ureter were identified inferiorly.  The ureter was able to be lifted  anteriorly off the psoas muscle and followed up to the renal pelvis.  The  renal pelvis appeared  to be of fairly normal caliber size and did not appear  to be significantly dilated.  There was no evidence of a crossing vessel.  It was therefore felt that the patient likely had an aperistaltic segment as  the etiology of her UPJ obstruction.  The renal pelvis and ureter were well  mobilized with care not to interrupt the blood supply to the ureter.  Once  adequately exposed, the ureteropelvic junction segment was excised sharply.  The renal pelvis was then spatulated medially and the ureter was spatulated  laterally.  The ureteropelvic junction segment that was removed was sent for  permanent pathologic analysis.  Holding stitches were placed both the renal  pelvis and in the ureter and attempts were then made to reapproximate these  structures.  However, there was noted to be a significantly large gap  between the ureter and renal pelvis.  This did seem somewhat unusual,  considering the fact that the patient's segment of the ureteropelvic  junction that was removed was not excessive.  It was then decided to free  the ureter some more inferiorly.  The colon was mobilized medially down  toward the level of the iliac vessels.  In addition, the right kidney was  completely mobilized, both at the upper pole and lateral attachments.  However, there still appeared to be significant tension when trying to   reapproximate the ureter and renal pelvis.  At this point, it was decided to  convert the procedure via an open surgical incision; therefore, the surgical  cart was undocked.    A subcostal incision was then made approximately 2 fingerbreadths below  the level of the right costal margin.  This was carried down through the  subcutaneous tissues and the muscle layers of the abdominal wall fascia.  This allowed the peritoneal cavity to be entered sharply, which was then  opened along the length of the incision.  A self-retaining Bookwalter  retractor was placed.  The bowel was carefully packed and the renal pelvis  and ureter were exposed along with the remainder of the retroperitoneum.  The kidney was fairly mobile, but was noted not to be completely freed  posteriorly.  It was therefore mobilized completely except for its  attachment to the renal artery and renal vein.  In addition, the ureter was freed more inferiorly.  However, it seemed that little progress was made in  being able to reapproximate the ureter and renal pelvis.  Therefore, a right-  sided Gibson incision was made and carried down through the subcutaneous  tissues and the fascial and muscular layers of the abdominal wall.  The  peritoneum was again entered.  As the ureter was not able to be  reanastomosed to the renal pelvis at this point, my option included further  mobilization of the ureter distally, autotransplantation, ileal ureter  reconstruction, nephrectomy or a possible bladder flap.  The renal pelvis  could not be used for a spiral flap due to its mostly intrarenal location.  The surgical bed was also taken out of flex without any additional length  gained. It was decided to attempt to free the distal ureter to see if this  would allow for a ureteral-renal pelvic anastamosis prior to consideration  of a major reconstructive procedure. This lower incision allowed the distal  ureter to be freed all the way down to  the bladder.  This did allow some  improved length of the ureter.  However, there was still a significant gap  that  resulted in too much tension to be able to properly reanastomose the  ureter to the renal pelvis.  Unfortunately, the renal pelvis was not very  dilated, which made the possibility of creating a spiral flap or other flap  procedure an impossibility.  At this time, I did talk with the patient's  husband and explained the situation.  I also explained our options, which  included nephrectomy versus autotransplantation, versus ureteral  reconstruction with a creation of an ileal ureter.  I recommended to the  patient's husband that we do everything we could to save the kidney,  considering her diabetes and risk for future renal deterioration.  I also  explained to the patient's husband that I would recommend an ileal ureter  over an autotransplantation in order to avoid any disruption of the renal  blood flow if at all possible. The patient was noted to have normal  preoperative renal function and no history of inflammatory bowel disease.  Therefore, the distal ileum was identified.  Approximately 15 cm from the  ileocecal valve, a marking suture was placed in the bowel.  The mesentery  was transilluminated and the avascular plane of Johnella Moloney was identified.  An  incision was made in the mesentery and the mesentery was taken down up to  the level of the bowel.  The mesenteric vessels were clamped with hemostats  and divided and ligated with 3-0 silk sutures.  The edge of the bowel was  then cleaned off.  Approximately 20-25 cm of ileum were marked out after the  bowel was laid in the right pericolic gutter to asses the length needed for  the ileal ureter.  The mesentery was again transilluminated at this point  proximal to the prior site.  Again, a short segment of mesentery was opened  and mesenteric vessels were controlled between hemostatic clamps and ligated with 3-0 silk ties.   This bowel edge was also cleared off.  The 55-mm Endo-  GIA was then used to divide the bowel segment for creation of the ileal  ureter.  The proximal and distal bowel edges were then brought together.  The antimesenteric corners were cut and the 55-mm Endo-GIA was again used to  reanastomose these bowel segments in a side-to-side fashion.  The TL-60  stapler was then used to complete the anastomosis.  3-0 silk sutures were  placed at the apices on either side of the bowel anastomosis, which was  performed on the antimesenteric border.  The mesenteric opening was closed  with interrupted 3-0 silk sutures.  The bowel segment to be used as the  ileal ureter was left inferior to the bowel segment which was now back in  continuity.  This segment was milked from its bowel contents.  It should be  noted that the patient did receive a mechanical bowel preparation prior to  the procedure and there did not appear to be a large amount of contamination  within the ileal segment.  This was done with towels laid under the bowel  segment.  The ileal ureter was then laid in an isoperistaltic fashion with  the distal end of the ileum toward the bladder and the proximal end toward  the renal pelvis.  The bladder was identified and the Foley catheter balloon  was brought up to the dome of the bladder.  It was incised here and the  bladder was opened; 3-0 Vicryl sutures were then placed through full  thickness of the bladder in an interrupted fashion.  These sutures were then  placed in the corresponding area into the distal ileum.  Prior to tying  these sutures down, a new 8 x 28 double-J ureteral stent was placed with the  distal end in the bladder and brought up through the proximal end of the  ileal segment.  The anastomotic sutures between the ileum and bladder were  then tied down.  Sterile saline was injected through the proximal end of the  ileum and there did not appear to be significant leakage from  the  anastomosis.  Attention then turned proximally.  The renal pelvis was  identified and interrupted 4-0 Vicryl sutures were placed in an interrupted  fashion around the renal pelvis.  These sutures were then placed into their  corresponding areas in the proximal segment of the ileum.  The patient's  ureteral stent was placed with the proximal curl up into the renal pelvis  prior to tying down these sutures.  Once these sutures were tied down, the  patient's bladder was filled to a capacity of approximately 500 mL.  There  was noted to be a small amount of leakage, both from the distal and proximal  anastomoses.  However, there were not any obvious leaks or obvious areas  that could be repaired at this point.  A #19 Blake drain was then placed  near the vesicoenteric anastomosis and an additional #19 Keenan Bachelor drain was  brought through a separate stab incision in the skin and placed in proximity  to the renal pelvis-enteric anastomosis.  The patient's bowel anastomosis was reexamined and appeared widely patent and viable.  Attention then turned  to closure.  The patient's previously placed 12-mm port sites were closed  with 0 Vicryl sutures.  The patient's subcostal and Gibson incisions were  closed with running #1 PDS sutures to close the layers of the abdominal  wall.  The skin was then reapproximated with staples after sterile saline  was used to irrigate the superficial wounds.  Sterile dressings were applied  and both drains were placed to bulb suction.  At the end of the procedure,  the patient was placed back in lithotomy and her Foley catheter was removed.  A new 24-French Simplastic hematuria catheter was placed in the bladder for  mucus irrigation.  The patient appeared to tolerate the procedure well.  She  was able to be extubated and transferred to recovery unit in satisfactory  condition. She was administered 2nd generation cephalosporin antibiotics due  to her bowel  procedure.           ______________________________  Raynelle Bring, MD  Electronically Signed     LB/MEDQ  D:  10/23/2006  T:  10/24/2006  Job:  [id number]

## 2011-05-17 NOTE — Discharge Summary (Signed)
[name], [name]             ACCOUNT NO.:  [id number]   MEDICAL RECORD NO.:  [id number]          Moore TYPE:  INP   LOCATION:  1406                         FACILITY:  [facility]   PHYSICIAN:  Raynelle Bring, MD      DATE OF BIRTH:  08/28/1951   DATE OF ADMISSION:  10/22/2006  DATE OF DISCHARGE:  10/31/2006                                 DISCHARGE SUMMARY   ADMISSION DIAGNOSIS:  1. Hematuria.  2. Right ureteropelvic junction obstruction.   DISCHARGE DIAGNOSES:  1. Hematuria.  2. Right ureteropelvic junction obstruction.   PROCEDURES.:  1. Cystoscopy with right retrograde pyelography and right      ureterorenoscopy.  2. Right ureteral stent placement.  3. Exploratory laparoscopy with attempted robotic assisted laparoscopic      dismembered pyeloplasty with open surgical conversion and creation of      ileal ureter.   HISTORY AND PHYSICAL:  For full details, please see admission history and  physical.  Briefly, [name] is a 60 year old female who initially  presented with Debbie acute onset of right flank pain.  A CT scan was performed  which demonstrated no evidence of stones but significant hydronephrosis and  significant obstruction with a very delayed nephrogram of Debbie right kidney.  She underwent further evaluation including ureteroscopy was found to have a  very narrowed ureteropelvic junction which could not be bypassed with Debbie  ureteroscope consistent with ureteropelvic junction obstruction.  A ureteral  stent was placed at that time to unobstruct her kidney.  In addition, she  underwent a brushing and cytology of this area which did not demonstrate  findings consistent with a malignancy.  She was, therefore, counseled  regarding further management options, including endoscopic versus surgical  reconstructive approaches for treatment of ureteropelvic junction  obstruction.  After discussion, she elected to proceed with a dismembered  pyeloplasty and specifically with  a robotic assisted laparoscopic approach.  [name] was counseled regarding potential risks and benefits of this  procedure and she consented to proceed forward.   HOSPITAL COURSE:  On October 22, 2006, [name] was taken to Debbie  operating room.  She underwent further evaluation of her right renal  collecting system which did not demonstrate any findings concerning for  malignancy.  A ureteral stent was then replaced and exploratory laparoscopy  was performed.  During Debbie procedure, [name]'s ureter was able to be  easily isolated and dissected free.  There did not appear to be a crossing  renal vessel but instead appeared to be an aperistaltic segment of Debbie  ureteropelvic junction as Debbie most likely cause of her UPJ obstruction.  Debbie  Moore's ureter did not allow for a tension-free reanastamosis and due to  Debbie fact that [name] does have diabetes and has already suffered some  decrease in function from Debbie right kidney, it was decided to proceed with  reconstructive attempts to provide appropriate drainage of Debbie kidney and to  do everything possible to salvage Debbie right kidney.  Therefore, it was  decided to proceed with an ileal ureter reconstruction.  [name]  tolerated  Debbie procedure well and postoperatively was transferred to Debbie  intensive care unit for observation and evaluation.  She was noted over Debbie  first couple days to have a fairly significant urine leak from her drain  near Debbie proximal entero- renal pelvic anastomosis.  She, therefore,  underwent right nephrostomy tube placement and immediately had cessation of  drainage from her retroperitoneal drain.  A CT scan had been performed prior  to nephrostomy tube placement which did confirm a urine leak from Debbie  anastomosis between Debbie proximal ileum and renal pelvis.  There was no  evidence of any urinoma or other fluid collection.  [name]'s diet was  able to be gradually advance until she was  tolerating a regular diet.  Her  lower pelvic drain was able to be removed.  She was maintained with Foley  catheter, nephrostomy tube, and upper retroperitoneal drain, and by  postoperative day nine, was doing well and was able to adequately care for  her drains.  Home health nurse consult was also obtained so as to help Debbie  Moore at home with her drains.  She was, therefore, discharged home in  good condition.   DISPOSITION:  Home.   DISCHARGE MEDICATIONS:  [name] was instructed to resume her regular  home medications.  She was given a prescription to take Vicodin as needed  for pain.  She was also given a prescription to take Cipro prior to her  return visit.   DISCHARGE INSTRUCTIONS:  [name] was instructed to resume her regular  diabetic diet.  She was told to be ambulatory but specifically to refrain  from any heavy lifting, strenuous activity, or driving.  She was instructed  on signs and symptoms of wound infection and told to call should she have  any problems.   FOLLOW UP:  [name] is scheduled to follow-up in one week.  She will have  a nephrostogram performed to assess Debbie status of her urine leak.  If her  urine leak has resolved at that point, I will consider capping her  nephrostomy tube and then making further progress in removing her remaining  drains.           ______________________________  Raynelle Bring, MD  Electronically Signed     LB/MEDQ  D:  10/31/2006  T:  11/01/2006  Job:  [id number]   cc:   Cresenciano Lick. Renold Genta, M.D.  Fax: [phone]

## 2011-05-17 NOTE — Op Note (Signed)
NAMEALIVIYAH, NEIFERT             ACCOUNT NO.:  [id number]   MEDICAL RECORD NO.:  [id number]          PATIENT TYPE:  AMB   LOCATION:  [facility]                         FACILITY:  [facility]   PHYSICIAN:  Raynelle Bring, MD      DATE OF BIRTH:  08/20/1951   DATE OF PROCEDURE:  07/08/2006  DATE OF DISCHARGE:                                 OPERATIVE REPORT   PREOPERATIVE DIAGNOSIS:  1.  Right-sided hydronephrosis.  2.  Microscopic hematuria.  3.  Right flank pain.   POSTOPERATIVE DIAGNOSES:  1.  Right-sided hydronephrosis.  2.  Microscopic hematuria.  3.  Right flank pain.   PROCEDURES.:  1.  Cystoscopy.  2.  Saline bladder wash.  3.  Bladder biopsy and fulguration.  4.  Bilateral retrograde pyelography with interpretation.  5.  Bilateral ureteroscopy.  6.  Brush biopsy of right ureteropelvic junction.  7.  Right ureteral stent placement.   SURGEON:  Raynelle Bring, M.D.   ANESTHESIA:  General.   COMPLICATIONS:  None.   INDICATIONS:  Ms. Fourman is a 60 year old female who presented after an  episode of right-sided flank pain.  She was found to have delayed  enhancement of her right kidney consistent with obstruction.  No calculi  were identified or other obvious etiology for her obstruction.  In addition,  she was noted to have microscopic hematuria.  Therefore, it was decided to  proceed with the above procedures.  Potential risks and benefits were  discussed with the patient.  She consented.   DESCRIPTION OF PROCEDURE:  The patient was taken to the operating room and a  general anesthetic was administered.  She was given preoperative  antibiotics, placed in the dorsal lithotomy position, prepped and draped in  the usual sterile fashion.  Next a preoperative time-out was performed.  Cystourethroscopy was then performed.  The ureteral orifices were in the  normal anatomic position and effluxing clear urine.  A complete systematic  survey of the bladder was then performed.  While  there were no obvious  bladder tumors, there was noted to be a couple of small mucosal  abnormalities on the left side of the bladder just lateral to the ureteral  orifice on that side.  These appeared to be papillary abnormalities,  although no definite tumor was identified.  The saline bladder washing was  obtained for cytology.  Bilateral retrograde pyelography was then performed.  The left side demonstrated a normal caliber ureter and renal pelvis without  filling defects in the ureter.  There was a questionable filling defect  versus calyx-on-end in the left lower pole.  Subsequent ureteroscopy on this  side would demonstrate this to be a calyx-on-end.  On the right side, there  was noted to be fullness of the renal pelvis with some blunting of the  calices.  There were no obvious filling defects in the ureter.  There was an  interpolar calyx that did not fill out completely.  Attention then turned to  the left ureter and a 0.038 guidewire was inserted up the renal pelvis under  fluoroscopic guidance.  The flexible ureteroscope was  then advanced over the  wire into the renal pelvis and the entire renal pelvis was examined.  There  was no evidence of any tumors or stones.  The previously mentioned  questionable filling defect did appear to be a posterior calyx that was on  end.  The ureteroscope was then removed.  Attention then turned to the right  ureteral orifice and again a 0.038 guidewire was inserted up into the renal  pelvis under fluoroscopic guidance.  The ureteroscope was then advanced but  could only be passed to the proximal ureter.  Under direct vision, the  ureteroscope was attempted to be manipulated past the proximal ureter into  the renal pelvis.  However, the caliber of the proximal ureter was narrow  enough that it would not permit the ureteroscope to be advanced.  A brush  biopsy was then performed of this area and sent for evaluation.  An attempt  was again made to  inject contrast into the renal pelvis to fill out the  interpolar calix.  However, this still was not able to be filled out  adequately.  Due to the fact that the right renal pelvis did not appear to  drain adequately and due to the patient's evidence of obstruction on prior  CT scan, a 6 x 26 double-J ureteral stent was advanced over the guide wire  up into the renal pelvis under fluoroscopic and cystoscopic guidance,  appropriately positioned, and the wire was removed.  A good curl was noted  in the renal pelvis as well as in the bladder.  Attention then returned to  the bladder.  The irrigation was switched from saline to water.  Cold cup  biopsies were performed of the previously mentioned abnormality on the left  bladder.  The Bugbee electrode was then used to fulgurate these lesions to  provide hemostasis.  Bladder was emptied and reinspected.  There was no  evidence of any bleeding.  The bladder was therefore emptied and the  cystoscope was removed and the procedure ended.  The patient appeared to  tolerate procedure well without complications.  She was able to be extubated  and returned to the recovery unit in satisfactory condition.   RADIOLOGIC INTERPRETATION:  The patient did undergo bilateral retrograde  pyelography.  The left retrograde pyelogram demonstrated normal caliber  ureter in renal pelvis with no evidence of dilation or fixed filling defects  except for a questionable area in the left lower pole.  This was felt to  possibly represent a calix on end versus a filling defect.  Subsequent  ureteroscopy demonstrated this to be a calix on end.  Retrograde pyelography  of the right side demonstrated some fullness of the renal pelvis as well as  an interpolar calix that could not be fully opacified.  There were no  filling defects within the ureter.           ______________________________  Raynelle Bring, MD  Electronically Signed    LB/MEDQ  D:  07/08/2006  T:   07/08/2006  Job:  [id number]

## 2011-05-17 NOTE — H&P (Signed)
NAMECHARDAI, [name]             ACCOUNT NO.:  [id number]   MEDICAL RECORD NO.:  [phone]              PATIENT TYPE:   LOCATION:                                 FACILITY:   PHYSICIAN:  Raynelle Bring, MD           DATE OF BIRTH:   DATE OF ADMISSION:  01/19/2007  DATE OF DISCHARGE:                              HISTORY & PHYSICAL   CHIEF COMPLAINT:  Nausea and vomiting.   HISTORY:  [name] is a 60 year old female who is status post a right  ileal ureter reconstruction on 10/22/2006.  Postoperatively, her course  has been complicated, most recently by an MRSA wound infection.  This  was drained; and she was placed on antibiotics.  In addition, she was  found to have an incidentally detected, right lower extremity deep  venous thrombosis; and has been on Coumadin therapy.  Over the past 10  days, she has had nausea and vomiting.  She denies fever or pain.  She  had not been having any dysuria or increased lower urinary tract  symptoms.  She has been having regular bowel movements.  Her stools have  been loose.  At this point, she has been unable to take much by mouth  over the past week.   PAST MEDICAL HISTORY:  1. Hypertension.  2. Type 2 diabetes.  3. History of thyroiditis.   PAST SURGICAL HISTORY:  1. C-section.  2. Dilation and curettage.  3. Ileal ureter.   MEDICATIONS:  1. Protonix.  2. Coumadin  3. Byetta.  4. Metformin.  5. Fosamax.  6. Lipitor.  7. Cozaar.  8. Lasix.   ALLERGIES:  SULFA.   SOCIAL HISTORY:  She denies tobacco or alcohol use.   FAMILY HISTORY:  No history of GU malignancy.   REVIEW OF SYSTEMS:  Complete review of systems was performed.  All  systems are negative except for nausea and vomiting.  Specifically, she  has not had any GI bleeding.   PHYSICAL EXAM:  VITAL SIGNS:  Temperature 97.5, pulse 125, respirations  20, blood pressure 120/73.  Weight is 89 kg.  CONSTITUTIONAL:  The patient appears weak and dehydrated.  SKIN:   Demonstrates poor turgor.  CARDIOVASCULAR:  Tachycardiac but regular.  LUNGS:  Clear bilaterally.  ABDOMEN:  Soft, nontender, and nondistended without abdominal masses.  The patient's right flank wound has almost healed completely.  There is  no erythema, induration, or drainage.  BACK:  No CVA tenderness.  EXTREMITIES:  No edema.  NEUROLOGIC:  Grossly intact   IMPRESSION:  Nausea and vomiting of uncertain etiology.   PLAN:  Will admit [name] for intravenous fluid hydration.  She will  have laboratory work including a CBC, complete metabolic panel, amylase,  and lipase.  I will send her stool for C. difficile.  I will also plan  to obtain a CT scan of the abdomen and pelvis to evaluate her for about  possible bowel obstruction; although she does not appear to have a bowel  obstruction clinically.  If there does not appear to be any  obvious  source of her nausea and vomiting, I will then consider a  gastroenterology consultation.           ______________________________  Raynelle Bring, MD  Electronically Signed     LB/MEDQ  D:  01/19/2007  T:  01/19/2007  Job:  [id number]

## 2011-05-17 NOTE — Consult Note (Signed)
[name], [name]             ACCOUNT NO.:  [id number]   MEDICAL RECORD NO.:  [id number]          PATIENT TYPE:  INP   LOCATION:  76                         FACILITY:  [facility]   PHYSICIAN:  Neysa Bonito, MD  DATE OF BIRTH:  [birth year]   DATE OF CONSULTATION:  DATE OF DISCHARGE:                                 CONSULTATION   PRIMARY CARE PHYSICIAN:  Dr. Renold Genta.  Request of consult was done by Dr.  Jeffie Pollock, Urologist.   REASON FOR ADMISSION:  Acute deep venous thrombosis.   FINDINGS:  This is a 60 year old female with multiple medical problems.  She went to her urologist today for pain and some mild tenderness around  her urological surgery that was done on the October 22, 2006.  She was  sent to the admissions and CAT scan was ordered for abdomen and pelvis  for evaluation of this new onset infection.  The patient was found to  have right femoral vein and right superficial vein thrombosis on the CAT  scan.  The patient denied any complaints of leg swelling.  She denies  use of oral contraceptives or steroid hormones.  She denies any recent  travel of decreased activity, though she said that she is not as active  as a few months back before she had the urological surgery.  Now, the  patient, she does not have any complaint of leg swelling, tenderness or  any pain.  She also denies any shortness of breath, any racing of her  heart, or any chest pain or pain around her chest and abdomen area.   PAST MEDICAL HISTORY:  1. Diabetes.  2. Hypertension.  3. Hypercholesterolemia.  4. Questionable heart failure.   SOCIAL HISTORY:  She lives with her husband.  Denies tobacco, drug use.   FAMILY HISTORY:  Significant of heart disease.   MEDICATIONS:  Byetta, metformin, Cozaar, Lipitor, Lasix and aspirin.   REVIEW OF SYSTEMS:  Unremarkable.   PHYSICAL EXAMINATION:  VITAL SIGNS:  Temperature 98.8, pulse of 132,  respiration 20, systolic pressure of 99991111, diastolic pressure 67.  GENERALLY:  Comfortable in bed, not in distress.  HEENT:  Atraumatic, normocephalic.  Eyes:  PERRLA.  Mouth:  No ulcer,  moist.  NECK:  Supple.  No JVD.  CHEST:  First and second heart sounds audible.  Chest clear to  auscultation bilaterally.  ABDOMEN:  Soft, nontender.  Bowel sounds are present.  GU:  As per Urologist's examination.  EXTREMITIES:  Examination is unremarkable.  No obvious swelling of the  right or left lower extremity.  No color changes of the skin.  NEUROLOGIC EXAMINATION:  Alert and oriented x3.  Moves all extremities.   LABORATORY DATA:  White count 13.7, hemoglobin 11.2, hematocrit 33.8,  platelets 724.  Her sodium is 140, potassium 3.9, chloride 107, bicarb  26, glucose 126.  BUN 11, creatinine 0.6.  CAT scan done today with the  impression of:  1) Thrombosis of the right femoral vein and superficial  femoral vein as well.  2) Evidence of soft tissue inflammation.   DIAGNOSIS:  Acute deep venous thrombosis, rule out  pulmonary embolism.   RECOMMENDATIONS:  This is a 60 year old female with a past medical  history of diabetes, hypertension, dyslipidemia, urethral obstruction  status post urological surgery.  Plan is to continue with Lovenox, for  now.  We will consider switching patient to Coumadin for discharge.  Patient has no symptoms for PE, but she has evidence of tachycardia on  vitals.  We will consider V/Q scan to rule out PE, though the treatment  is already started for DVT, which will cover PE treatment.  It might  effect the duration of treatment down the road, depending on the  presence of a pulmonary embolism or not.  So, patient just had a  contrast for the CT abdomen and pelvis.  We will not subject her to  another contrast, so we will just go for now with V/Q scan instead of CT  angio.  Diabetes:  Patient is stable on Byetta and metformin.  We will  hold metformin for 48 hours because of the risks of CIN to be resumed  after 48 hours.  We will add  insulin sliding scale to improve the  glycemic control.  Hypertension:  The patient is stable on Cozaar.  We  will continue further, reassess her blood pressure.  Hyperlipidemia:  We  will continue Lipitor at the same dose.  We will check fasting blood  glucose down the road to adjust the medication.  We will check liver  function tests as well.  The infection:  Patient has evidence of  infection in the _ area as per the CAT scan.  The patient is already on  Zosyn and vancomycin.  The patient has history of heart problem, but she  could not describe what kind of heart problem she has.  She was kept on  Lasix for sometime, though she states she does not have heart failure.  We will check 2D echo to evaluate the ejection fraction and adjustment  accordingly.  The patient will have Protonix for GI prophylaxis.      Neysa Bonito, MD  Electronically Signed     EME/MEDQ  D:  12/29/2006  T:  12/29/2006  Job:  [id number]

## 2011-05-17 NOTE — H&P (Signed)
[name], [name]             ACCOUNT NO.:  [id number]   MEDICAL RECORD NO.:  [id number]          PATIENT TYPE:  INP   LOCATION:  27                         [facility]  [facility]   PHYSICIAN:  Ronald L. Rosana Hoes, M.D.  DATE OF BIRTH:  [birth year]   DATE OF ADMISSION:  12/29/2006  DATE OF DISCHARGE:                              HISTORY & PHYSICAL   CHIEF COMPLAINT:  I have an infection.   HISTORY OF PRESENT ILLNESS:  [name] is a lovely, 60 year old, white  female who is status post right ileal ureteral placement by Dr. Alinda Money  for benign ureteropelvic junction obstruction on October 22, 2006.  She  has done extremely well and her nephrostomy tube was removed the first  of this month.  Her followup scans were okay with a patent ileal  ureteral segment and she has been doing extremely well.  She notes over  the last few days developing some warmness and tenderness over the right  flank region.  It has progressed and she notes over the last 24 hours  she has developed drainage from the nephrostomy tube drainage site.  There has been no nausea or vomiting, but her abdomen feels tight to  her.  She has been urinating well and she has not had chills or fever to  her knowledge.   ALLERGIES:  SULFA, INSULIN AND ORANGE JUICE.   PAST MEDICAL HISTORY:  1. Hypertension.  2. Varicose veins.  3. Type 2 diabetes mellitus.  4. Early Hashimoto's thyroiditis.  5. Ureteropelvic junction obstruction with hematuria as noted here.   PAST SURGICAL HISTORY:  1. C-section in 1983.  2. D&C in 1989.  3. Ureteral ileal replacement on October 22, 2006.   SOCIAL HISTORY:  She is a negative smoker, negative drinker.   FAMILY HISTORY:  Noncontributory.   REVIEW OF SYSTEMS:  She has no shortness of breath, dyspnea, exertion,  chest pain or GI complaints.   PHYSICAL EXAMINATION:  VITAL SIGNS:  Blood pressure 115/67, pulse 132,  temperature 98.8 degrees Fahrenheit, respirations 20.  GENERAL:  She is a  well-developed, well-nourished, slight obese female  in no acute distress, oriented x3.  HEENT:  Head, nose and throat normal.  NECK:  Without masses or thyromegaly.  CHEST:  Normal diaphragmatic motion.  HEART:  Normal S1, S2 without murmurs, rubs or gallops.  ABDOMEN:  Soft, nontender without masses or organomegaly.  On the right  side, there is significant cellulitis approximately 10 cm in diameter  and there is slight drainage from the nephrostomy site.  It is tender to  touch, but I feel no fluctuance.  EXTREMITIES:  Normal.  NEUROLOGIC:  Intact.  SKIN:  Normal.   LABORATORY DATA AND X-RAY FINDINGS:  White blood cell count 13.7,  hemoglobin 11.2, hematocrit 33.8.  CMET which was essentially within  normal limits, BUN 11 and creatinine 0.6.   IMPRESSION:  Right flank cellulitis, rule out methicillin-resistant  Staphylococcus aureus.   PLAN:  1. Culture and sensitivity of the wound.  2. Urine culture and sensitivity.  3. IV vancomycin.  4. Warm compresses.  5. CT scan of the  abdomen and pelvis to rule out abscess.  6. Will proceed accordingly.      [place]ash[place] Rosana Hoes, M.D.  Electronically Signed     RLD/MEDQ  D:  12/29/2006  T:  12/29/2006  Job:  [id number]

## 2011-06-03 ENCOUNTER — Encounter: Payer: Self-pay | Admitting: Internal Medicine

## 2011-06-03 ENCOUNTER — Ambulatory Visit (INDEPENDENT_AMBULATORY_CARE_PROVIDER_SITE_OTHER): Payer: BC Managed Care – PPO | Admitting: Internal Medicine

## 2011-06-03 VITALS — BP 138/90 | HR 100 | Temp 98.7°F | Ht 64.0 in | Wt 232.0 lb

## 2011-06-03 DIAGNOSIS — J329 Chronic sinusitis, unspecified: Secondary | ICD-10-CM

## 2011-06-03 DIAGNOSIS — E785 Hyperlipidemia, unspecified: Secondary | ICD-10-CM

## 2011-06-03 DIAGNOSIS — N184 Chronic kidney disease, stage 4 (severe): Secondary | ICD-10-CM | POA: Insufficient documentation

## 2011-06-03 DIAGNOSIS — N189 Chronic kidney disease, unspecified: Secondary | ICD-10-CM

## 2011-06-03 NOTE — Patient Instructions (Signed)
Take Ceftin as directed. Drink fluids and rest. Call if not better in 10 days sooner if worse

## 2011-06-03 NOTE — Progress Notes (Signed)
  Subjective:    Patient ID: [name], female    DOB: [birth year], 60 y.o.   MRN: [id number]  HPI patient with history of hypertension, hyperlipidemia, renal insufficiency with history of view our symptoms since May 30. Has sinus pressure. Her teeth hurt. Has nasal congestion. Has been taking Claritin without relief. Has been taken off kidney transplant list. Doing much better with renal functions. Followed by Dr. Lorrene Reid for chronic kidney disease.    Review of Systems     Objective:   Physical Exam has boggy nasal mucosa. Pharynx very slightly injected without exudate. TMs pink with good light reflex. Neck is supple. Chest is clear to auscultation.        Assessment & Plan:  Sinusitis-plan is to treat with Ceftin 250 mg (#40) 2 tablets twice a day for 10 days with no refill. Nothing prescribed for cough.

## 2011-06-14 ENCOUNTER — Encounter: Payer: Self-pay | Admitting: Internal Medicine

## 2011-06-18 ENCOUNTER — Ambulatory Visit (INDEPENDENT_AMBULATORY_CARE_PROVIDER_SITE_OTHER): Payer: BC Managed Care – PPO | Admitting: "Endocrinology

## 2011-06-18 VITALS — BP 120/86 | HR 106 | Wt 222.8 lb

## 2011-06-18 DIAGNOSIS — IMO0001 Reserved for inherently not codable concepts without codable children: Secondary | ICD-10-CM

## 2011-06-18 DIAGNOSIS — E049 Nontoxic goiter, unspecified: Secondary | ICD-10-CM

## 2011-06-18 DIAGNOSIS — E669 Obesity, unspecified: Secondary | ICD-10-CM

## 2011-06-18 DIAGNOSIS — I10 Essential (primary) hypertension: Secondary | ICD-10-CM

## 2011-06-18 DIAGNOSIS — E785 Hyperlipidemia, unspecified: Secondary | ICD-10-CM

## 2011-06-18 LAB — POCT GLYCOSYLATED HEMOGLOBIN (HGB A1C): Hemoglobin A1C: 5.5

## 2011-06-18 LAB — GLUCOSE, POCT (MANUAL RESULT ENTRY): POC Glucose: 139

## 2011-06-24 ENCOUNTER — Other Ambulatory Visit: Payer: BC Managed Care – PPO | Admitting: Internal Medicine

## 2011-06-24 ENCOUNTER — Encounter: Payer: Self-pay | Admitting: Internal Medicine

## 2011-06-24 DIAGNOSIS — E785 Hyperlipidemia, unspecified: Secondary | ICD-10-CM

## 2011-06-24 DIAGNOSIS — E119 Type 2 diabetes mellitus without complications: Secondary | ICD-10-CM

## 2011-06-24 LAB — LIPID PANEL
Cholesterol: 206 mg/dL — ABNORMAL HIGH (ref 0–200)
HDL: 34 mg/dL — ABNORMAL LOW (ref >39)
LDL Cholesterol: 99 mg/dL (ref 0–99)
Total CHOL/HDL Ratio: 6.1 Ratio
Triglycerides: 367 mg/dL — ABNORMAL HIGH (ref <150)
VLDL: 73 mg/dL — ABNORMAL HIGH (ref 0–40)

## 2011-06-24 LAB — HEPATIC FUNCTION PANEL
ALT: 21 U/L (ref 0–35)
AST: 21 U/L (ref 0–37)
Albumin: 4.3 g/dL (ref 3.5–5.2)
Alkaline Phosphatase: 84 U/L (ref 39–117)
Bilirubin, Direct: 0.1 mg/dL (ref 0.0–0.3)
Indirect Bilirubin: 0.2 mg/dL (ref 0.0–0.9)
Total Bilirubin: 0.3 mg/dL (ref 0.3–1.2)
Total Protein: 6.9 g/dL (ref 6.0–8.3)

## 2011-06-24 LAB — HEMOGLOBIN A1C
Hgb A1c MFr Bld: 6.5 % — ABNORMAL HIGH (ref <5.7)
Mean Plasma Glucose: 140 mg/dL — ABNORMAL HIGH (ref <117)

## 2011-06-25 ENCOUNTER — Ambulatory Visit (INDEPENDENT_AMBULATORY_CARE_PROVIDER_SITE_OTHER): Payer: BC Managed Care – PPO | Admitting: Internal Medicine

## 2011-06-25 ENCOUNTER — Encounter: Payer: Self-pay | Admitting: Internal Medicine

## 2011-06-25 ENCOUNTER — Ambulatory Visit: Payer: BC Managed Care – PPO | Admitting: Internal Medicine

## 2011-06-25 VITALS — BP 130/82 | HR 88 | Temp 97.6°F | Ht 64.0 in | Wt 220.0 lb

## 2011-06-25 DIAGNOSIS — N184 Chronic kidney disease, stage 4 (severe): Secondary | ICD-10-CM

## 2011-06-25 DIAGNOSIS — I10 Essential (primary) hypertension: Secondary | ICD-10-CM

## 2011-06-25 DIAGNOSIS — E119 Type 2 diabetes mellitus without complications: Secondary | ICD-10-CM

## 2011-06-25 DIAGNOSIS — E669 Obesity, unspecified: Secondary | ICD-10-CM

## 2011-06-25 DIAGNOSIS — K219 Gastro-esophageal reflux disease without esophagitis: Secondary | ICD-10-CM

## 2011-06-25 DIAGNOSIS — J309 Allergic rhinitis, unspecified: Secondary | ICD-10-CM

## 2011-06-25 DIAGNOSIS — E785 Hyperlipidemia, unspecified: Secondary | ICD-10-CM

## 2011-06-25 NOTE — Progress Notes (Signed)
  Subjective:    Patient ID: [name], female    DOB: 1951-07-11, 60 y.o.   MRN: [id number]  HPI patient in today for followup of diabetes mellitus and hyperlipidemia. She has seen Dr. Tobe Sos recently. She says her A1c at his office was 5.5%. We checked it in our office for [facility] and repeat received a value of 6.5% which is slightly better than March 2012  when it was 6.7%. She is followed by Dr. Lorrene Reid, nephrologist for chronic kidney disease stage IV. In 2007 the patient had right-sided flank pain and was thought to have nephrolithiasis. 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 She underwent reconstructive surgery at that time and had an ileal diversion. Creatinine was mildly elevated at that time at 1.5 mg/dL in January 2008 she was admitted with dehydration and renal insufficiency. Creatinine was 1.6 but improved with hydration to 1.39. She had been on some antibiotics for wound infection and had nausea and vomiting. In July 2009, admitted with acute renal failure with creatinine up to 6. She had a left ureteral stent placed on 06/23/2008 following a renogram showing poor function and clearance of tracer from the left kidney as well as increase in retention in the right kidney suggesting partial obstruction. Retrograde pyelogram showed no signs of obstruction. It was never very clear what caused the acute renal failure. Was thought to be acute on chronic injury perhaps. Possible contribution from obstruction, taking ARB and metformin. She also has a history of hypertension. History of significant hypertriglyceridemia. She has had a large right lower abdominal hernia repair by Dr. Crist Infante at [facility] in 2010 and subsequently underwent an incisional ventral hernia repair with with mash 12/14/2010 by Dr. Dossie Der at [facility]. Baseline creatinine is approximately 2.24 at this point in time. At one point she was on a transplant list but has been  removed. She had a Pneumovax immunization in May 2007, DTaP vaccine February 2005, also history of osteopenia in GE reflux. History of allergic rhinitis. History of dependent edema and. History of carpal tunnel syndrome in 2006. gets annual influenza immunization. Oddly she has had a reaction recently to [place] in which she developed a fever which is one of the apparent side effects. We have discontinued that entirely and she's trying to manage her hyperlipidemia with Lipitor which she now wants to be on generic Lipitor and fish oil. Today we see improvement in her triglycerides which are nail 367 and previously were 460. She has had triglycerides as low as 329 in May 2011. Total cholesterol is now 206 with an LDL cholesterol of 99.    Review of Systems     Objective:   Physical Exam chest clear; cardiac exam: Regular rate and rhythm normal S1/S2; extremities without edema        Assessment & Plan:  1-hyperlipidemia  2-hypertension  3-diabetes mellitus  4-chronic kidney disease stage IV  5-allergic rhinitis  6-GE reflux  7-obesity  8-history of osteopenia  9-history of deep venous thrombosis right leg December 2007 status post surgery  10-history of carpal tunnel syndrome and 2006  Plan is to see patient again in early January 2013 for physical examination. Dr. Gertie Fey is GYN physician

## 2011-06-25 NOTE — Patient Instructions (Signed)
Please continue with diet and attempt to exercise if possible. Continue facial for hypertriglyceridemia. We will switch to generic Lipitor for cost-containment. Please keep regular appointments with Dr. Lorrene Reid. Continue to see Dr. bruit. Continue to be followed by Dr. Tobe Sos for endocrine issues. We will plan to do a physical examination and other health maintenance January 2013

## 2011-09-26 LAB — BASIC METABOLIC PANEL
BUN: 40 — ABNORMAL HIGH
CO2: 17 — ABNORMAL LOW
Calcium: 9.2
Chloride: 112
Creatinine, Ser: 2.91 — ABNORMAL HIGH
GFR calc Af Amer: 20 — ABNORMAL LOW
GFR calc non Af Amer: 17 — ABNORMAL LOW
Glucose, Bld: 101 — ABNORMAL HIGH
Potassium: 4.2
Sodium: 138

## 2011-09-26 LAB — HEMOGLOBIN AND HEMATOCRIT, BLOOD
HCT: 34 — ABNORMAL LOW
Hemoglobin: 11.4 — ABNORMAL LOW

## 2011-09-27 LAB — BASIC METABOLIC PANEL
BUN: 33 — ABNORMAL HIGH
BUN: 39 — ABNORMAL HIGH
BUN: 49 — ABNORMAL HIGH
BUN: 65 — ABNORMAL HIGH
BUN: 71 — ABNORMAL HIGH
BUN: 72 — ABNORMAL HIGH
CO2: 27
CO2: 12 — ABNORMAL LOW
CO2: 12 — ABNORMAL LOW
CO2: 18 — ABNORMAL LOW
CO2: 32
CO2: 33 — ABNORMAL HIGH
Calcium: 8.9
Calcium: 8 — ABNORMAL LOW
Calcium: 8.1 — ABNORMAL LOW
Calcium: 8.3 — ABNORMAL LOW
Calcium: 8.9
Calcium: 9.3
Chloride: 110
Chloride: 101
Chloride: 104
Chloride: 114 — ABNORMAL HIGH
Chloride: 116 — ABNORMAL HIGH
Chloride: 120 — ABNORMAL HIGH
Creatinine, Ser: 3.59 — ABNORMAL HIGH
Creatinine, Ser: 4.31 — ABNORMAL HIGH
Creatinine, Ser: 4.51 — ABNORMAL HIGH
Creatinine, Ser: 5.1 — ABNORMAL HIGH
Creatinine, Ser: 5.9 — ABNORMAL HIGH
Creatinine, Ser: 5.95 — ABNORMAL HIGH
GFR calc Af Amer: 16 — ABNORMAL LOW
GFR calc Af Amer: 11 — ABNORMAL LOW
GFR calc Af Amer: 12 — ABNORMAL LOW
GFR calc Af Amer: 13 — ABNORMAL LOW
GFR calc Af Amer: 9 — ABNORMAL LOW
GFR calc Af Amer: 9 — ABNORMAL LOW
GFR calc non Af Amer: 13 — ABNORMAL LOW
GFR calc non Af Amer: 10 — ABNORMAL LOW
GFR calc non Af Amer: 11 — ABNORMAL LOW
GFR calc non Af Amer: 7 — ABNORMAL LOW
GFR calc non Af Amer: 7 — ABNORMAL LOW
GFR calc non Af Amer: 9 — ABNORMAL LOW
Glucose, Bld: 100 — ABNORMAL HIGH
Glucose, Bld: 113 — ABNORMAL HIGH
Glucose, Bld: 80
Glucose, Bld: 92
Glucose, Bld: 93
Glucose, Bld: 94
Potassium: 4.2
Potassium: 3 — ABNORMAL LOW
Potassium: 3.5
Potassium: 3.7
Potassium: 4
Potassium: 4.6
Sodium: 144
Sodium: 137
Sodium: 138
Sodium: 143
Sodium: 143
Sodium: 144

## 2011-09-27 LAB — RENAL FUNCTION PANEL
Albumin: 2.4 — ABNORMAL LOW
Albumin: 2.5 — ABNORMAL LOW
Albumin: 2.5 — ABNORMAL LOW
Albumin: 2.6 — ABNORMAL LOW
Albumin: 2.6 — ABNORMAL LOW
BUN: 35 — ABNORMAL HIGH
BUN: 35 — ABNORMAL HIGH
BUN: 44 — ABNORMAL HIGH
BUN: 55 — ABNORMAL HIGH
BUN: 64 — ABNORMAL HIGH
CO2: 18 — ABNORMAL LOW
CO2: 30
CO2: 30
CO2: 30
CO2: 32
Calcium: 7.9 — ABNORMAL LOW
Calcium: 7.9 — ABNORMAL LOW
Calcium: 8.1 — ABNORMAL LOW
Calcium: 8.3 — ABNORMAL LOW
Calcium: 8.4
Chloride: 102
Chloride: 102
Chloride: 104
Chloride: 104
Chloride: 114 — ABNORMAL HIGH
Creatinine, Ser: 4.26 — ABNORMAL HIGH
Creatinine, Ser: 4.38 — ABNORMAL HIGH
Creatinine, Ser: 4.46 — ABNORMAL HIGH
Creatinine, Ser: 4.55 — ABNORMAL HIGH
Creatinine, Ser: 5.2 — ABNORMAL HIGH
GFR calc Af Amer: 10 — ABNORMAL LOW
GFR calc Af Amer: 12 — ABNORMAL LOW
GFR calc Af Amer: 12 — ABNORMAL LOW
GFR calc Af Amer: 13 — ABNORMAL LOW
GFR calc Af Amer: 13 — ABNORMAL LOW
GFR calc non Af Amer: 10 — ABNORMAL LOW
GFR calc non Af Amer: 10 — ABNORMAL LOW
GFR calc non Af Amer: 10 — ABNORMAL LOW
GFR calc non Af Amer: 11 — ABNORMAL LOW
GFR calc non Af Amer: 9 — ABNORMAL LOW
Glucose, Bld: 101 — ABNORMAL HIGH
Glucose, Bld: 80
Glucose, Bld: 93
Glucose, Bld: 93
Glucose, Bld: 95
Phosphorus: 3.4
Phosphorus: 4.1
Phosphorus: 4.7 — ABNORMAL HIGH
Phosphorus: 5 — ABNORMAL HIGH
Phosphorus: 5.7 — ABNORMAL HIGH
Potassium: 2.8 — ABNORMAL LOW
Potassium: 3 — ABNORMAL LOW
Potassium: 3.4 — ABNORMAL LOW
Potassium: 3.6
Potassium: 3.8
Sodium: 139
Sodium: 143
Sodium: 144
Sodium: 145
Sodium: 145

## 2011-09-27 LAB — CBC
HCT: 23.8 — ABNORMAL LOW
HCT: 24.8 — ABNORMAL LOW
HCT: 26.8 — ABNORMAL LOW
HCT: 27.2 — ABNORMAL LOW
HCT: 28.6 — ABNORMAL LOW
HCT: 32.2 — ABNORMAL LOW
HCT: 32.3 — ABNORMAL LOW
Hemoglobin: 10.6 — ABNORMAL LOW
Hemoglobin: 10.7 — ABNORMAL LOW
Hemoglobin: 7.9 — CL
Hemoglobin: 8.6 — ABNORMAL LOW
Hemoglobin: 8.9 — ABNORMAL LOW
Hemoglobin: 9.1 — ABNORMAL LOW
Hemoglobin: 9.3 — ABNORMAL LOW
MCHC: 32.5
MCHC: 32.8
MCHC: 33
MCHC: 33.1
MCHC: 33.2
MCHC: 33.4
MCHC: 34.5
MCV: 93.5
MCV: 94.1
MCV: 94.7
MCV: 94.7
MCV: 95.4
MCV: 96
MCV: 96.1
Platelets: 262
Platelets: 268
Platelets: 319
Platelets: 359
Platelets: 380
Platelets: 401 — ABNORMAL HIGH
Platelets: 531 — ABNORMAL HIGH
RBC: 2.55 — ABNORMAL LOW
RBC: 2.62 — ABNORMAL LOW
RBC: 2.79 — ABNORMAL LOW
RBC: 2.89 — ABNORMAL LOW
RBC: 2.98 — ABNORMAL LOW
RBC: 3.38 — ABNORMAL LOW
RBC: 3.4 — ABNORMAL LOW
RDW: 13.7
RDW: 13.8
RDW: 14.2
RDW: 14.2
RDW: 14.2
RDW: 14.5
RDW: 14.5
WBC: 10.1
WBC: 10.4
WBC: 10.5
WBC: 7.4
WBC: 8.1
WBC: 8.3
WBC: 8.3

## 2011-09-27 LAB — DIFFERENTIAL
Basophils Absolute: 0.1
Basophils Relative: 1
Eosinophils Absolute: 0.6
Eosinophils Relative: 5
Lymphocytes Relative: 20
Lymphs Abs: 2.1
Monocytes Absolute: 0.8
Monocytes Relative: 8
Neutro Abs: 7
Neutrophils Relative %: 67

## 2011-09-27 LAB — IRON AND TIBC
Iron: 27 — ABNORMAL LOW
Iron: 31 — ABNORMAL LOW
Saturation Ratios: 15 — ABNORMAL LOW
Saturation Ratios: 16 — ABNORMAL LOW
TIBC: 184 — ABNORMAL LOW
TIBC: 188 — ABNORMAL LOW
UIBC: 157
UIBC: 157

## 2011-09-27 LAB — URINALYSIS, MICROSCOPIC ONLY
Bilirubin Urine: NEGATIVE
Bilirubin Urine: NEGATIVE
Glucose, UA: NEGATIVE
Glucose, UA: NEGATIVE
Ketones, ur: NEGATIVE
Ketones, ur: NEGATIVE
Nitrite: NEGATIVE
Nitrite: NEGATIVE
Protein, ur: 100 — AB
Protein, ur: NEGATIVE
Specific Gravity, Urine: 1.01
Specific Gravity, Urine: 1.011
Urine-Other: NONE SEEN
Urobilinogen, UA: 0.2
Urobilinogen, UA: 0.2
pH: 6.5
pH: 6.5

## 2011-09-27 LAB — URINE CULTURE
Colony Count: NO GROWTH
Culture: NO GROWTH
Special Requests: NEGATIVE

## 2011-09-27 LAB — CROSSMATCH
ABO/RH(D): O POS
Antibody Screen: NEGATIVE

## 2011-09-27 LAB — UIFE/LIGHT CHAINS/TP QN, 24-HR UR
Albumin, U: DETECTED
Alpha 1, Urine: DETECTED — AB
Alpha 2, Urine: DETECTED — AB
Beta, Urine: DETECTED — AB
Free Kappa Lt Chains,Ur: 12.8 — ABNORMAL HIGH (ref 0.04–1.51)
Free Lambda Lt Chains,Ur: 1.26 — ABNORMAL HIGH (ref 0.08–1.01)
Gamma Globulin, Urine: DETECTED — AB
Total Protein, Urine: 19.7

## 2011-09-27 LAB — FOLATE RBC: RBC Folate: 265

## 2011-09-27 LAB — PROTEIN ELECTROPH W RFLX QUANT IMMUNOGLOBULINS
Albumin ELP: 57.1
Alpha-1-Globulin: 5.9 — ABNORMAL HIGH
Alpha-2-Globulin: 12.4 — ABNORMAL HIGH
Beta 2: 5.4
Beta Globulin: 5.8
Gamma Globulin: 13.4
M-Spike, %: NOT DETECTED
Total Protein ELP: 5.3 — ABNORMAL LOW

## 2011-09-27 LAB — C4 COMPLEMENT: Complement C4, Body Fluid: 23

## 2011-09-27 LAB — HEMOGLOBIN AND HEMATOCRIT, BLOOD
HCT: 29.7 — ABNORMAL LOW
Hemoglobin: 9.6 — ABNORMAL LOW

## 2011-09-27 LAB — PROTEIN / CREATININE RATIO, URINE
Creatinine, Urine: 84.5
Protein Creatinine Ratio: 0.38 — ABNORMAL HIGH
Total Protein, Urine: 32

## 2011-09-27 LAB — HEMOGLOBIN A1C
Hgb A1c MFr Bld: 5.3
Mean Plasma Glucose: 111

## 2011-09-27 LAB — C3 COMPLEMENT: C3 Complement: 79 — ABNORMAL LOW

## 2011-09-27 LAB — B-NATRIURETIC PEPTIDE (CONVERTED LAB): Pro B Natriuretic peptide (BNP): <30

## 2011-09-27 LAB — PTH, INTACT AND CALCIUM
Calcium, Total (PTH): 8.3 — ABNORMAL LOW
PTH: 42.7

## 2011-09-27 LAB — GLUCOSE, CAPILLARY: Glucose-Capillary: 90

## 2011-09-27 LAB — ABO/RH: ABO/RH(D): O POS

## 2011-09-27 LAB — PROTIME-INR
INR: 1
Prothrombin Time: 13.3

## 2011-09-27 LAB — HEPATITIS B SURFACE ANTIGEN: Hepatitis B Surface Ag: NEGATIVE

## 2011-09-27 LAB — MICROALBUMIN, URINE: Microalb, Ur: 3.87 — ABNORMAL HIGH

## 2011-09-27 LAB — HEMOGLOBIN: Hemoglobin: 9.8 — ABNORMAL LOW

## 2011-09-27 LAB — APTT: aPTT: 25

## 2011-09-27 LAB — VITAMIN B12: Vitamin B-12: 146 — ABNORMAL LOW (ref 211–911)

## 2011-09-27 LAB — FERRITIN: Ferritin: 231 (ref 10–291)

## 2011-09-27 LAB — MAGNESIUM: Magnesium: 1.6

## 2011-09-30 LAB — BASIC METABOLIC PANEL
BUN: 54 — ABNORMAL HIGH
CO2: 23
Calcium: 9.6
Chloride: 112
Creatinine, Ser: 2.72 — ABNORMAL HIGH
GFR calc Af Amer: 22 — ABNORMAL LOW
GFR calc non Af Amer: 18 — ABNORMAL LOW
Glucose, Bld: 121 — ABNORMAL HIGH
Potassium: 5
Sodium: 142

## 2011-09-30 LAB — GLUCOSE, CAPILLARY
Glucose-Capillary: 107 — ABNORMAL HIGH
Glucose-Capillary: 99

## 2011-09-30 LAB — HEMOGLOBIN AND HEMATOCRIT, BLOOD
HCT: 31.3 — ABNORMAL LOW
Hemoglobin: 10.3 — ABNORMAL LOW

## 2011-10-03 ENCOUNTER — Other Ambulatory Visit: Payer: Self-pay | Admitting: Gynecology

## 2011-10-08 ENCOUNTER — Ambulatory Visit (INDEPENDENT_AMBULATORY_CARE_PROVIDER_SITE_OTHER): Payer: BC Managed Care – PPO | Admitting: "Endocrinology

## 2011-10-08 ENCOUNTER — Encounter: Payer: Self-pay | Admitting: "Endocrinology

## 2011-10-08 VITALS — BP 122/81 | HR 77 | Wt 212.0 lb

## 2011-10-08 DIAGNOSIS — R1013 Epigastric pain: Secondary | ICD-10-CM

## 2011-10-08 DIAGNOSIS — E1149 Type 2 diabetes mellitus with other diabetic neurological complication: Secondary | ICD-10-CM

## 2011-10-08 DIAGNOSIS — G609 Hereditary and idiopathic neuropathy, unspecified: Secondary | ICD-10-CM

## 2011-10-08 DIAGNOSIS — I10 Essential (primary) hypertension: Secondary | ICD-10-CM

## 2011-10-08 DIAGNOSIS — E669 Obesity, unspecified: Secondary | ICD-10-CM

## 2011-10-08 DIAGNOSIS — K3189 Other diseases of stomach and duodenum: Secondary | ICD-10-CM

## 2011-10-08 DIAGNOSIS — R000 Tachycardia, unspecified: Secondary | ICD-10-CM

## 2011-10-08 DIAGNOSIS — E1143 Type 2 diabetes mellitus with diabetic autonomic (poly)neuropathy: Secondary | ICD-10-CM

## 2011-10-08 DIAGNOSIS — IMO0001 Reserved for inherently not codable concepts without codable children: Secondary | ICD-10-CM

## 2011-10-08 DIAGNOSIS — E1142 Type 2 diabetes mellitus with diabetic polyneuropathy: Secondary | ICD-10-CM

## 2011-10-08 DIAGNOSIS — G909 Disorder of the autonomic nervous system, unspecified: Secondary | ICD-10-CM

## 2011-10-08 DIAGNOSIS — E049 Nontoxic goiter, unspecified: Secondary | ICD-10-CM

## 2011-10-08 DIAGNOSIS — K140 Glossitis: Secondary | ICD-10-CM

## 2011-10-08 DIAGNOSIS — E782 Mixed hyperlipidemia: Secondary | ICD-10-CM

## 2011-10-08 DIAGNOSIS — R609 Edema, unspecified: Secondary | ICD-10-CM

## 2011-10-08 LAB — GLUCOSE, POCT (MANUAL RESULT ENTRY): POC Glucose: 86

## 2011-10-08 LAB — POCT GLYCOSYLATED HEMOGLOBIN (HGB A1C): Hemoglobin A1C: 5.5

## 2011-10-08 NOTE — Progress Notes (Addendum)
Subjective:  Patient Name: [name] Date of Birth: 01/05/1951  MRN: [id number]  [name]  presents to the office today for follow-up of her 2 diabetes, obesity, goiter, autonomic neuropathy, tachycardia, peripheral neuropathy, edema, dyspepsia, chronic renal insufficiency, and combined hyperlipidemia.  HISTORY OF PRESENT ILLNESS:   [name] is a 60 y.o. Caucasian woman. [name] was unaccompanied.  1. The patient was first referred to me on 12/19/05 by her primary care provider, Dr. Emeline General, for evaluation and comanagement of type 2 diabetes and obesity. The diabetes had been diagnosed approximately one half years previously. The patient had crossed the 200 pound weight mark about 3-4 years previously. She was then taking Glucotrol and metformin. Past medical history was positive for hyperlipidemia, hypertension, osteopenia, GERD, and peripheral edema. Surgical history was positive for a previous C-section and wrist ganglion surgery.She had an allergic reaction to sulfa. Her weight was 243.3 pounds. Her BMI was 41. Her blood pressure was elevated at 136/94. Her hemoglobin A1c was 7.4%. She had a diffusely enlarged 30 g goiter. She had mildly decreased sensation in her heels to both touch and monofilament. Laboratory data from 11/12/05 showed a cholesterol of 196, triglycerides 243, HDL 35, and LDL 112. Her TSH was 1.070. I started the patient on our Eat Right Diet plan. I asked her to try to exercise daily. I increased her metformin at supper to 1000 mg. 2. During the past 6 years, a lot has changed in the patient's medical condition.  A. T2DM: In early 2007 I started the patient on Byetta twice daily. By December 2007 her weight had decreased to 191.8 pounds and her hemoglobin A1c decreased to 5.7%. Since she continued to have gastrointestinal upset on Byetta, and since she believed that she didn't need it anymore, she decided to stop the Byetta. In October of 2008 her weight was 192  pounds and her hemoglobin A1c had increased to 6.0%. I started her on Janumet, twice daily. In April 2009 her weight was down to 182 pounds. Her hemoglobin A1c was 5.5%. In October of 2009, although her weight was 182 pounds and her hemoglobin A1c was 5.6%, I learned that her serum creatinine had increased to 2.87. Janumet had already been discontinued. On 03/12/10 her weight had increased back to 232.4 pounds. Her hemoglobin A1c was 7.5%. In April of 2010 I started her on Victoza, 0.6 mg per day. The patient's last [facility] visit was on 02/11/11. Her weight had decreased to 220 pounds. Her hemoglobin A1c was 6.1%. She felt better overall. In the interim, she had gradually increased the Victoza dose to 0.6 plus 6 clicks. She also tried Statistician but developed a fever and discontinued the medication.  B. The patient had ureteral problems for several years. She had ureteral reconstruction surgery in October 2007. A portion of her intestines was resected and used for the reconstruction at that time. In October 2009 she had another repair of a ureteral (or urethral?) blockage.Her serum creatinine increased rapidly to 6.0, then declined to 2.7. She was evaluated at [facility] for possible renal transplant. However, since her serum creatinine has remained stable, she has been placed on the inactive list for transplantation.  3. Pertinent Review of Systems:  Constitutional: The patient feels "good".  Eyes: Vision is good as long as she wears her glasses. She had a recent dilated eye examination on 05/17/11. There were no signs of diabetic retinopathy. There are no significant eye complaints. Neck: The patient has no complaints of anterior neck swelling,  soreness, tenderness,  pressure, discomfort, or difficulty swallowing.  Heart: Heart rate increases with exercise or other physical activity. The patient has no complaints of palpitations, irregular heat beats, chest pain, or chest pressure. Gastrointestinal: She still has  dyspepsia at times, often associated with taking fish oil. Bowel movents seem normal. The patient has no complaints of excessive hunger, acid reflux, stomach aches or pains, diarrhea, or constipation. Legs: Muscle mass and strength seem normal. There are no complaints of numbness, tingling, burning, or pain. She still has some leg edema at times.  Feet: There are no obvious foot problems. There are no complaints of numbness, tingling, burning, or pain. No edema is noted. Hypoglycemia: She does have occasional blood glucose in the 70's. 4. BG printout: BGs vary from 72 to 130. Her morning blood sugars are less than 100.   PAST MEDICAL, FAMILY, AND SOCIAL HISTORY:  Past Medical History  Diagnosis Date  . Hypercholesteremia   . Allergic rhinitis   . GERD (gastroesophageal reflux disease)   . Type II or unspecified type diabetes mellitus without mention of complication, not stated as uncontrolled   . Essential hypertension, benign   . Menopause   . Osteopenia   . Dependent edema   . Carbuncle, trunk   . Carpal tunnel syndrome   . Cyclical vomiting   . Chronic kidney disease     Family History  Problem Relation Age of Onset  . COPD Mother   . Hypertension Mother   . Heart disease Father   . Hyperlipidemia Father   . Heart disease Sister     Current outpatient prescriptions:aspirin 81 MG chewable tablet, Chew 81 mg by mouth daily.  , Disp: , Rfl: ;  atenolol (TENORMIN) 100 MG tablet, Take 100 mg by mouth daily.  , Disp: , Rfl: ;  atorvastatin (LIPITOR) 40 MG tablet, Take 40 mg by mouth daily.  , Disp: , Rfl: ;  Cholecalciferol (VITAMIN D) 2000 UNITS CAPS, Take by mouth.  , Disp: , Rfl: ;  Liraglutide (VICTOZA [place]), Inject 0.6 mg into the skin daily.  , Disp: , Rfl:  Multiple Vitamins-Minerals (MULTIVITAMIN WITH MINERALS) tablet, Take 1 tablet by mouth daily.  , Disp: , Rfl: ;  omega-3 acid ethyl esters (LOVAZA) 1 G capsule, Take 1 g by mouth daily.  , Disp: , Rfl: ;  omeprazole (PRILOSEC)  20 MG capsule, Take 20 mg by mouth daily.  , Disp: , Rfl: ;  sodium bicarbonate 650 MG tablet, Take 650 mg by mouth 2 (two) times daily. 3 in am 3 in pm, Disp: , Rfl:   Allergies as of 06/18/2011 - Review Complete 06/18/2011  Allergen Reaction Noted  . Tricor  06/03/2011  . Altace Cough 06/03/2011  . Sulfa antibiotics  04/30/2011    1. Work and Family: The patient is still working more than 40 hours per week. 2. Activities: She is doing some yoga. She has not resumed working. 3. Smoking, alcohol, or drugs: None 4. Primary Care Provider: Elby Showers, MD, MD  ROS: There are no other significant problems involving her other body systems.   Objective:  Vital Signs:  BP 120/86  Pulse 106  Wt 222 lb 12.8 oz (101.061 kg)   Ht Readings from Last 3 Encounters:  06/27/11 5\' 4"  (1.626 m)  06/03/11 5\' 4"  (1.626 m)   Wt Readings from Last 3 Encounters:  10/08/11 212 lb (96.163 kg)  06/27/11 220 lb (99.791 kg)  06/18/11 222 lb 12.8 oz (101.061 kg)  There is no height on file to calculate BSA.  PHYSICAL EXAM:  Constitutional: The patient appears healthy and well nourished. Her weight is unchanged from past visit. Face: The face appears normal.  Eyes: There is no obvious arcus or proptosis. Moisture appears normal. Mouth: The oropharynx and tongue appear normal. Oral moisture is normal. Neck: The neck appears to be visibly normal. No carotid bruits are noted. The thyroid gland is 20 grams in size. The consistency of the thyroid gland is normal. The thyroid gland is not tender to palpation. Lungs: The lungs are clear to auscultation. Air movement is good. Heart: Heart rate and rhythm are regular.Heart sounds S1 and S2 are normal. I did not appreciate any pathologic cardiac murmurs. Abdomen: The abdomen appears to be normal in size. Bowel sounds are normal. There is no obvious hepatomegaly, splenomegaly, or other mass effect.  Arms: Muscle size and bulk are normal for age. Hands: There  is no obvious tremor. Phalangeal and metacarpophalangeal joints are normal. Palmar muscles are normal. Palmar skin is normal. Palmar moisture is also normal. Legs: Muscles appear normal for age. She had 1+ edema. Feet: Feet are normally formed. Dorsalis pedal pulses are normal, 2+ on the right foot and 1+ on the left foot. She has tinea pedis of both feet, especially on the heels. She has 2+ calluses. Neurologic: Strength is normal for age in both the upper and lower extremities. Muscle tone is normal. Sensation to touch is normal in both legs, but remains decreased in her heels.    LAB DATA:Labs: 03/15/11: Creatinine was 2.24. Cholesterol was 216, triglycerides 460, HDL 33. TSH was 1.540 and free T4 was 1.18.   Assessment and Plan:   ASSESSMENT:  1. Type 2 diabetes mellitus: Patient is doing well on Victoza. 2. Hypoglycemia: None 3. Peripheral neuropathy: This is relatively mild and is definitely better than it was 6 years ago. 4. Autonomic neuropathy with tachycardia: This process is static. The longer we can keep her hemoglobin A1c in a good range, the better her neuropathy and tachycardia will be. 5. Hyperlipidemia: The patient is under treatment with Lovaza and Lipitor. Her lipid panel in March was better. 6. Goiter: The patient was euthyroid in March. Her thyroid gland size is essentially normal today. 7. Chronic renal insufficiency: Her renal insufficiency appears to have stabilized. 8. Hypertension: Dr. Lorrene Reid is managing her hypertension.  PLAN:  1. Diagnostic: No labs at this time 2. Therapeutic: I asked the patient to try to walk an hour per day. I asked her to advance her Victoza dose by one click every 2 weeks as tolerated. 3. Patient education: We discussed the importance of eating right and exercising right for control of her blood pressure, her weight, and her diabetes. 4. Follow-up: Return in about 3 months (around 09/18/2011).  Level of Service: This visit lasted in excess  of 40 minutes. More than 50% of the visit was devoted to counseling.

## 2011-10-08 NOTE — Patient Instructions (Signed)
Followup visit in 4 months. Please have lipid panel and thyroid test performed in a fasting state. Please fast after 10 PM on the night prior to the lab draw. You may have water during the fast.

## 2011-10-09 ENCOUNTER — Ambulatory Visit: Payer: BC Managed Care – PPO | Admitting: "Endocrinology

## 2011-10-30 ENCOUNTER — Other Ambulatory Visit: Payer: Self-pay | Admitting: Gynecology

## 2011-10-30 DIAGNOSIS — Z1231 Encounter for screening mammogram for malignant neoplasm of breast: Secondary | ICD-10-CM

## 2011-11-23 ENCOUNTER — Encounter: Payer: Self-pay | Admitting: "Endocrinology

## 2011-11-23 DIAGNOSIS — E1143 Type 2 diabetes mellitus with diabetic autonomic (poly)neuropathy: Secondary | ICD-10-CM | POA: Insufficient documentation

## 2011-11-23 DIAGNOSIS — R000 Tachycardia, unspecified: Secondary | ICD-10-CM | POA: Insufficient documentation

## 2011-11-23 DIAGNOSIS — R1013 Epigastric pain: Secondary | ICD-10-CM | POA: Insufficient documentation

## 2011-11-23 DIAGNOSIS — E1169 Type 2 diabetes mellitus with other specified complication: Secondary | ICD-10-CM | POA: Insufficient documentation

## 2011-11-23 DIAGNOSIS — E049 Nontoxic goiter, unspecified: Secondary | ICD-10-CM | POA: Insufficient documentation

## 2011-11-23 DIAGNOSIS — E1142 Type 2 diabetes mellitus with diabetic polyneuropathy: Secondary | ICD-10-CM | POA: Insufficient documentation

## 2011-11-23 DIAGNOSIS — E669 Obesity, unspecified: Secondary | ICD-10-CM | POA: Insufficient documentation

## 2011-11-23 NOTE — Progress Notes (Addendum)
Subjective:  Patient Name: [name] Date of Birth: 1951-07-25  MRN: [id number]  [name]  presents to the office today for follow-up of her 2 diabetes, obesity, goiter, autonomic neuropathy, tachycardia, peripheral neuropathy, edema, dyspepsia, chronic renal insufficiency, tinea, and combined hyperlipidemia.  HISTORY OF PRESENT ILLNESS:   [name] is a 60 y.o. Caucasian woman. Cyani was unaccompanied.  1. The patient was first referred to me on 12/19/05 by her primary care provider, Dr. Emeline General, for evaluation and comanagement of type 2 diabetes and obesity.  2. During the past 6 years we've tried her on several different medication regimens. When she developed chronic renal insufficiency in 2009, all metformin-containing medications were discontinued. In April 2010 I started her on Victoza at a dose of 0.6 mg/day. As of her last clinic visit on 06/18/11 she had increased the Victoza dose to 0.6 plus 6 clicks. In the interim, she has done surprisingly well. 3. Pertinent Review of Systems:  Constitutional: The patient feels "good".  Eyes: Vision is good as long as she wears her glasses. Her last dilated eye examination was on 05/17/11. There were no signs of diabetic retinopathy. There are no significant eye complaints. Neck: The patient has no complaints of anterior neck swelling, soreness, tenderness,  pressure, discomfort, or difficulty swallowing.  Heart: Heart rate increases with exercise or other physical activity. The patient has no complaints of palpitations, irregular heat beats, chest pain, or chest pressure. Gastrointestinal: She recently changed the time of day that she takes her Lovaza. As a result, she now has fewer GERD symptoms.Bowel movents seem normal. The patient has no complaints of excessive hunger, acid reflux, stomach aches or pains, diarrhea, or constipation. Legs: Muscle mass and strength seem normal. There are no complaints of numbness, tingling,  burning, or pain. She still has some leg edema at times.  Feet: There are no obvious foot problems. There are no complaints of numbness, tingling, burning, or pain. No edema is noted. Hypoglycemia: She does have occasional blood glucose in the 70's. 4. BG printout: BGs vary from 88 to 148. Her morning blood sugars vary from the 90s-110s.   PAST MEDICAL, FAMILY, AND SOCIAL HISTORY:  Past Medical History  Diagnosis Date  . Hypercholesteremia   . Allergic rhinitis   . GERD (gastroesophageal reflux disease)   . Type II or unspecified type diabetes mellitus without mention of complication, not stated as uncontrolled   . Essential hypertension, benign   . Menopause   . Osteopenia   . Dependent edema   . Carbuncle, trunk   . Carpal tunnel syndrome   . Cyclical vomiting   . Chronic kidney disease   . Diabetes mellitus type II   . Goiter   . Obesity   . Diabetic peripheral neuropathy associated with type 2 diabetes mellitus   . Diabetic autonomic neuropathy   . Tachycardia   . Combined hyperlipidemia associated with type 2 diabetes mellitus   . Dyspepsia     Family History  Problem Relation Age of Onset  . COPD Mother   . Hypertension Mother   . Thyroid disease Mother   . Heart disease Father   . Hyperlipidemia Father   . Diabetes Father   . Heart disease Sister   . Cancer Maternal Grandfather   . Diabetes Paternal Grandmother     Current outpatient prescriptions:aspirin 81 MG chewable tablet, Chew 81 mg by mouth daily.  , Disp: , Rfl: ;  atenolol (TENORMIN) 100 MG tablet, Take 100 mg by  mouth daily.  , Disp: , Rfl: ;  atorvastatin (LIPITOR) 40 MG tablet, Take 40 mg by mouth daily.  , Disp: , Rfl: ;  Cholecalciferol (VITAMIN D) 2000 UNITS CAPS, Take by mouth.  , Disp: , Rfl: ;  Liraglutide (VICTOZA [place]), Inject 0.6 mg into the skin daily.  , Disp: , Rfl:  Multiple Vitamins-Minerals (MULTIVITAMIN WITH MINERALS) tablet, Take 1 tablet by mouth daily.  , Disp: , Rfl: ;  omega-3 acid  ethyl esters (LOVAZA) 1 G capsule, Take 1 g by mouth daily.  , Disp: , Rfl: ;  sodium bicarbonate 650 MG tablet, Take 650 mg by mouth 2 (two) times daily. 3 in am 3 in pm, Disp: , Rfl: ;  omeprazole (PRILOSEC) 20 MG capsule, Take 20 mg by mouth daily.  , Disp: , Rfl:   Allergies as of 10/08/2011 - Review Complete 10/08/2011  Allergen Reaction Noted  . Tricor  06/03/2011  . Altace Cough 06/03/2011  . Sulfa antibiotics  04/30/2011    1. Work and Family: The patient is still working more than 40 hours per week. 2. Activities: She is doing some yoga. She has hired a Physiological scientist.  3. Smoking, alcohol, or drugs: None 4. Primary Care Provider: Elby Showers, MD, MD  ROS: There are no other significant problems involving her other body systems.   Objective:  Vital Signs:  BP 122/81  Pulse 77  Wt 212 lb (96.163 kg)   Ht Readings from Last 3 Encounters:  06/27/11 5\' 4"  (1.626 m)  06/03/11 5\' 4"  (1.626 m)   Wt Readings from Last 3 Encounters:  10/08/11 212 lb (96.163 kg)  06/27/11 220 lb (99.791 kg)  06/18/11 222 lb 12.8 oz (101.061 kg)   There is no height on file to calculate BSA.  PHYSICAL EXAM:  Constitutional: The patient appears healthy and well nourished. She is alert and prior to Her weight has decreased by 8 pounds. Face: The face appears normal.  Eyes: There is no obvious arcus or proptosis. Moisture appears normal. Mouth: The oropharynx and tongue appear normal. Oral moisture is normal. Neck: The neck appears to be visibly normal. No carotid bruits are noted. The thyroid gland is 20+ grams in size. The right lobe still within normal limits. The left lobe is slightly enlarged again today. The consistency of the right lobe is normal, but the left lobe feels relatively firm. The thyroid gland is not tender to palpation. Lungs: The lungs are clear to auscultation. Air movement is good. Heart: Heart rate and rhythm are regular.Heart sounds S1 and S2 are normal. I did not  appreciate any pathologic cardiac murmurs. Abdomen: The abdomen is enlarged in size. Bowel sounds are normal. There is no obvious hepatomegaly, splenomegaly, or other mass effect.  Arms: Muscle size and bulk are normal for age. Hands: There is no obvious tremor. Phalangeal and metacarpophalangeal joints are normal. Palmar muscles are normal. Palmar skin is normal. Palmar moisture is also normal. Legs: Muscles appear normal for age. She has 1+ edema. Feet: Feet are normally formed. Dorsalis pedal pulses are normal 1+ bilaterally. She has tinea pedis of both feet, especially on the heels. She has 2+ calluses. Neurologic: Strength is normal for age in both the upper and lower extremities. Muscle tone is normal. Sensation to touch is normal in both legs, but remains decreased in her heels.    LAB DATA:Labs: 06/24/11: Cholesterol was 206, triglycerides 367, HDL 34, and LDL 99.  Hemoglobin A1c today is 5.5%.  Assessment and Plan:   ASSESSMENT:  1. Type 2 diabetes mellitus: Patient continues to do well on Victoza. 2. Hypoglycemia: None 3. Peripheral neuropathy: This is relatively mild. 4. Autonomic neuropathy with tachycardia: With the continued improvement in blood glucose control, the neuropathy and tachycardia have improved. The longer we can keep her hemoglobin A1c in a good range, the better her neuropathy and tachycardia will be. 5. Hyperlipidemia: The patient is under treatment with Lovaza and Lipitor. Her lipid panel in June was better. 6. Goiter: The patient was euthyroid in March. Her thyroid gland size has increased again today. The waxing and waning of her thyroid gland size suggest that the patient has intermittent Hashimoto's disease activity.  7. Chronic renal insufficiency: Her renal insufficiency appears to have stabilized. 8. Hypertension: Dr. Lorrene Reid is managing her hypertension. 9. Dyspepsia/GERD: She is doing better.  PLAN:  1. Diagnostic: Repeat lipid panel and TFTs. 2.  Therapeutic: I again asked the patient to try to walk an hour per day. We may need to increase her Lipitor to 40 mg one day alternating with 60 the next. 3. Patient education: We again discussed the importance of eating right and exercising right for control of her blood pressure, her weight, and her diabetes. 4. Follow-up: Return in about 4 months (around 02/08/2012).  Level of Service: This visit lasted in excess of 40 minutes. More than 50% of the visit was devoted to counseling.

## 2011-12-09 ENCOUNTER — Ambulatory Visit
Admission: RE | Admit: 2011-12-09 | Discharge: 2011-12-09 | Disposition: A | Discharge status: Home / Self Care | Payer: BC Managed Care – PPO | Source: Ambulatory Visit | Attending: Gynecology | Admitting: Gynecology

## 2011-12-09 DIAGNOSIS — Z1231 Encounter for screening mammogram for malignant neoplasm of breast: Secondary | ICD-10-CM

## 2011-12-10 IMAGING — MG MM DIGITAL SCREENING BILAT
4 series · 4 of 4 positions shown · non-contrast
Comparison: none

DG SCREEN MAMMOGRAM BILATERAL
Bilateral CC and MLO view(s) were taken.

DIGITAL SCREENING MAMMOGRAM WITH CAD:
The breast tissue is almost entirely fatty.  No masses or malignant type calcifications are 
identified.  Compared with prior studies.
Images were processed with CAD.

[R CC]
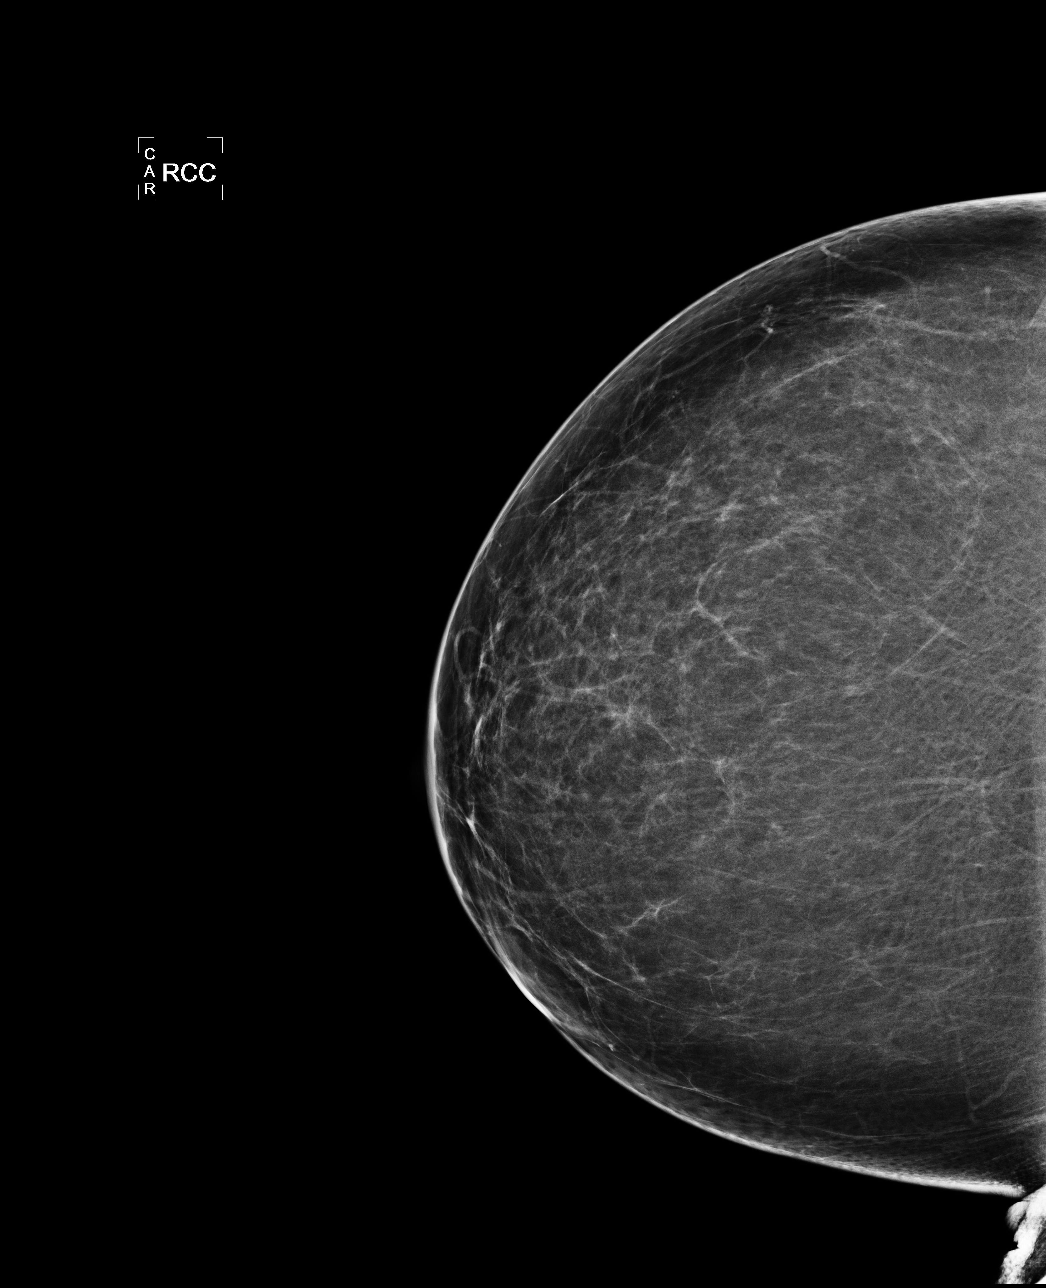

[L CC]
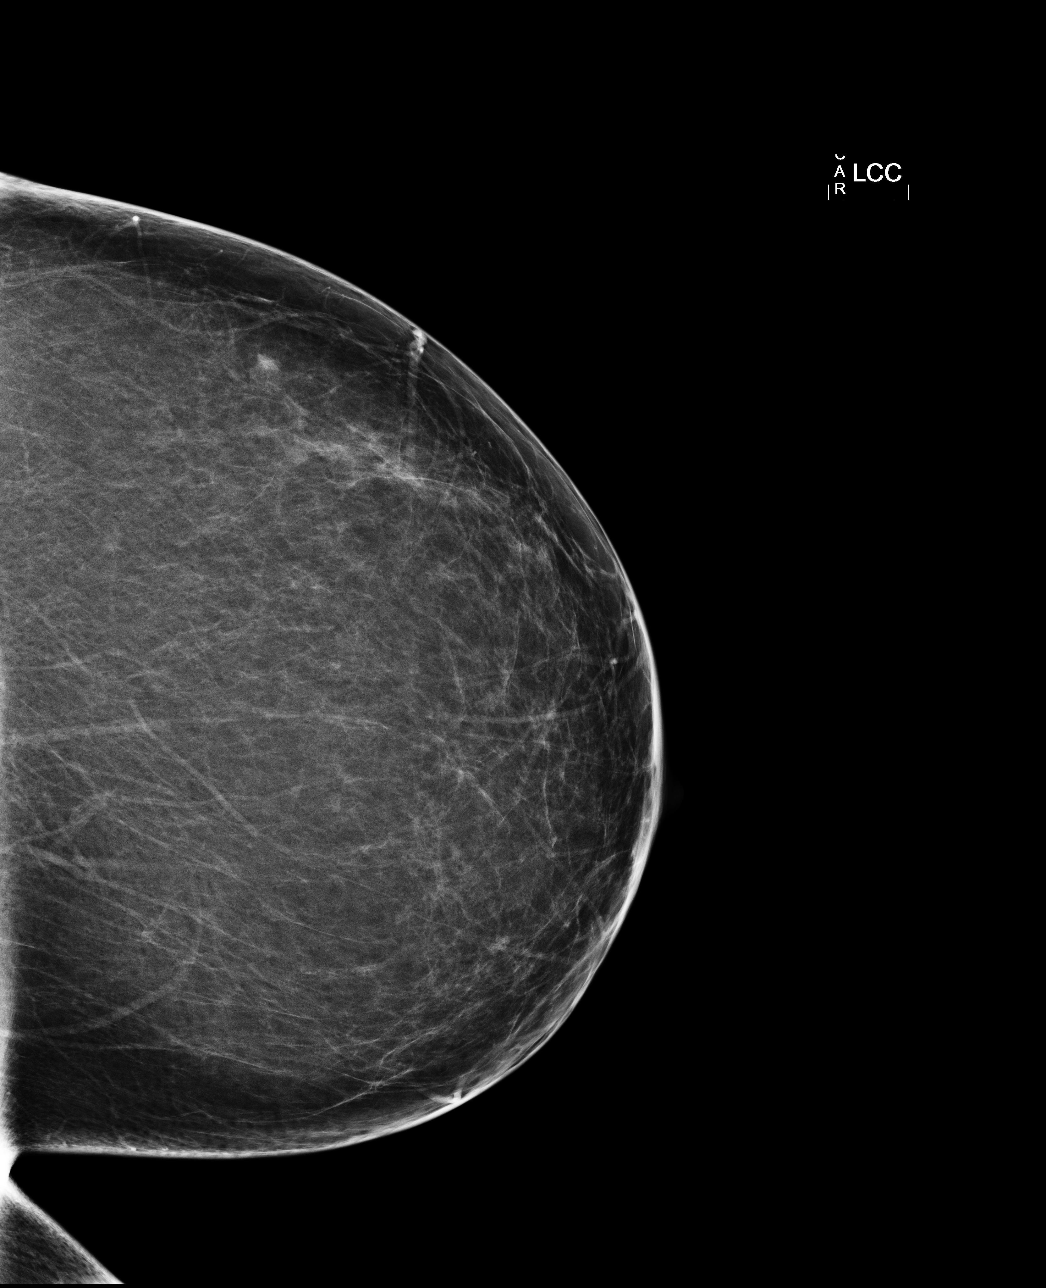

[L MLO]
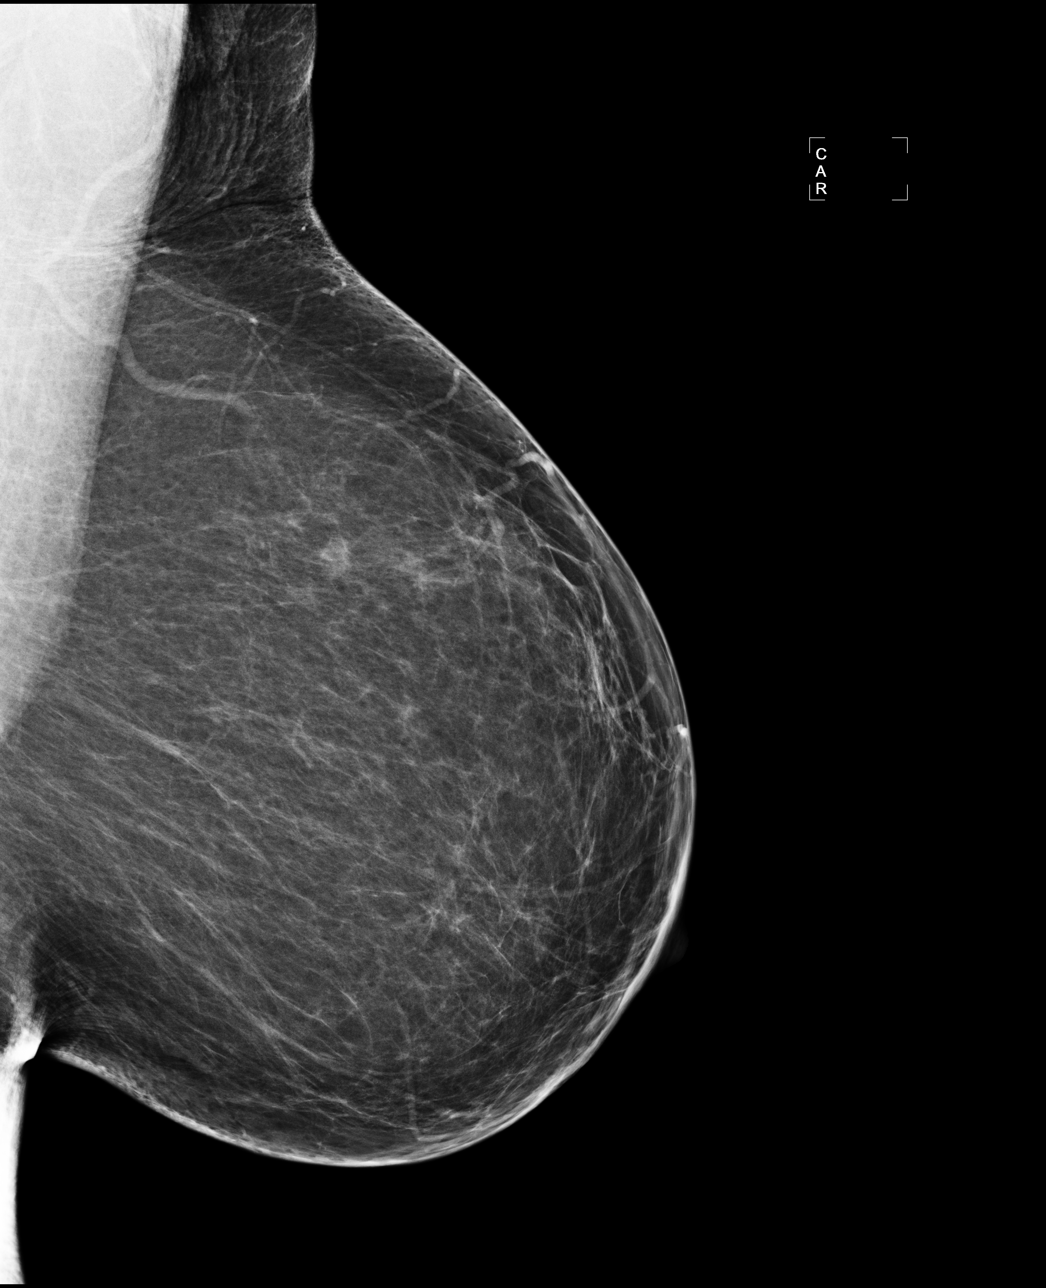

[R MLO]
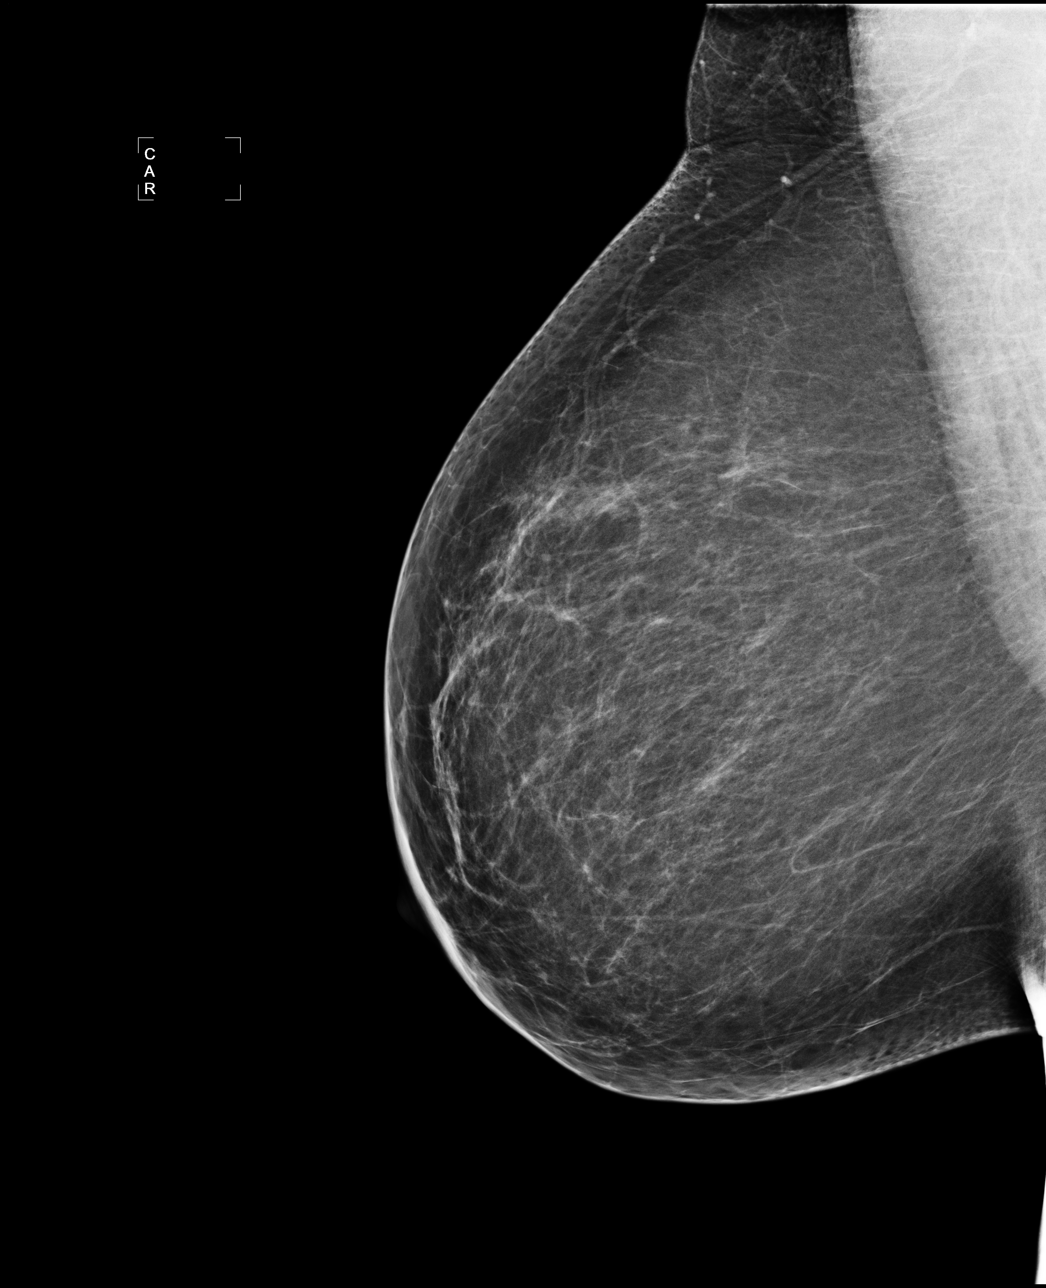

[4 of 4 positions shown; findings below may reference images not displayed]

IMPRESSION: No specific mammographic evidence of malignancy.  Next screening mammogram is recommended in one 
year.

A result letter of this screening mammogram will be mailed directly to the patient.

ASSESSMENT: Negative - BI-RADS 1

Screening mammogram in 1 year.
,

## 2011-12-20 ENCOUNTER — Encounter: Payer: Self-pay | Admitting: Internal Medicine

## 2012-01-06 ENCOUNTER — Other Ambulatory Visit: Payer: BC Managed Care – PPO | Admitting: Internal Medicine

## 2012-01-06 LAB — CBC WITH DIFFERENTIAL/PLATELET
Basophils Absolute: 0 10*3/uL (ref 0.0–0.1)
Basophils Relative: 0 % (ref 0–1)
Eosinophils Absolute: 0.7 10*3/uL (ref 0.0–0.7)
Eosinophils Relative: 6 % — ABNORMAL HIGH (ref 0–5)
HCT: 48.5 % — ABNORMAL HIGH (ref 36.0–46.0)
Hemoglobin: 15.6 g/dL — ABNORMAL HIGH (ref 12.0–15.0)
Lymphocytes Relative: 17 % (ref 12–46)
Lymphs Abs: 1.9 10*3/uL (ref 0.7–4.0)
MCH: 31.8 pg (ref 26.0–34.0)
MCHC: 32.2 g/dL (ref 30.0–36.0)
MCV: 99 fL (ref 78.0–100.0)
Monocytes Absolute: 0.8 10*3/uL (ref 0.1–1.0)
Monocytes Relative: 7 % (ref 3–12)
Neutro Abs: 7.7 10*3/uL (ref 1.7–7.7)
Neutrophils Relative %: 69 % (ref 43–77)
Platelets: 311 10*3/uL (ref 150–400)
RBC: 4.9 MIL/uL (ref 3.87–5.11)
RDW: 14.2 % (ref 11.5–15.5)
WBC: 11.2 10*3/uL — ABNORMAL HIGH (ref 4.0–10.5)

## 2012-01-07 ENCOUNTER — Ambulatory Visit (INDEPENDENT_AMBULATORY_CARE_PROVIDER_SITE_OTHER): Payer: BC Managed Care – PPO | Admitting: Internal Medicine

## 2012-01-07 DIAGNOSIS — N189 Chronic kidney disease, unspecified: Secondary | ICD-10-CM

## 2012-01-07 DIAGNOSIS — I10 Essential (primary) hypertension: Secondary | ICD-10-CM

## 2012-01-07 DIAGNOSIS — E669 Obesity, unspecified: Secondary | ICD-10-CM

## 2012-01-07 DIAGNOSIS — R6889 Other general symptoms and signs: Secondary | ICD-10-CM

## 2012-01-07 DIAGNOSIS — E1169 Type 2 diabetes mellitus with other specified complication: Secondary | ICD-10-CM

## 2012-01-07 DIAGNOSIS — Z0000 Encounter for general adult medical examination without abnormal findings: Secondary | ICD-10-CM

## 2012-01-07 DIAGNOSIS — E785 Hyperlipidemia, unspecified: Secondary | ICD-10-CM

## 2012-01-07 DIAGNOSIS — E119 Type 2 diabetes mellitus without complications: Secondary | ICD-10-CM

## 2012-01-07 LAB — COMPREHENSIVE METABOLIC PANEL
ALT: 16 U/L (ref 0–35)
AST: 17 U/L (ref 0–37)
Albumin: 4.3 g/dL (ref 3.5–5.2)
Alkaline Phosphatase: 95 U/L (ref 39–117)
BUN: 47 mg/dL — ABNORMAL HIGH (ref 6–23)
CO2: 21 mEq/L (ref 19–32)
Calcium: 10 mg/dL (ref 8.4–10.5)
Chloride: 107 mEq/L (ref 96–112)
Creat: 2.66 mg/dL — ABNORMAL HIGH (ref 0.50–1.10)
Glucose, Bld: 96 mg/dL (ref 70–99)
Potassium: 5.8 mEq/L — ABNORMAL HIGH (ref 3.5–5.3)
Sodium: 142 mEq/L (ref 135–145)
Total Bilirubin: 0.4 mg/dL (ref 0.3–1.2)
Total Protein: 7.1 g/dL (ref 6.0–8.3)

## 2012-01-07 LAB — MICROALBUMIN / CREATININE URINE RATIO

## 2012-01-07 LAB — POCT URINALYSIS DIPSTICK
Bilirubin, UA: NEGATIVE
Glucose, UA: NEGATIVE
Ketones, UA: NEGATIVE
Leukocytes, UA: NEGATIVE
Nitrite, UA: NEGATIVE
Spec Grav, UA: 1.01
Urobilinogen, UA: NEGATIVE
pH, UA: 5.5

## 2012-01-07 LAB — LIPID PANEL
Cholesterol: 218 mg/dL — ABNORMAL HIGH (ref 0–200)
HDL: 34 mg/dL — ABNORMAL LOW (ref >39)
LDL Cholesterol: 114 mg/dL — ABNORMAL HIGH (ref 0–99)
Total CHOL/HDL Ratio: 6.4 Ratio
Triglycerides: 351 mg/dL — ABNORMAL HIGH (ref <150)
VLDL: 70 mg/dL — ABNORMAL HIGH (ref 0–40)

## 2012-01-07 LAB — BASIC METABOLIC PANEL
BUN: 48 mg/dL — ABNORMAL HIGH (ref 6–23)
CO2: 23 mEq/L (ref 19–32)
Calcium: 10.6 mg/dL — ABNORMAL HIGH (ref 8.4–10.5)
Chloride: 104 mEq/L (ref 96–112)
Creat: 2.51 mg/dL — ABNORMAL HIGH (ref 0.50–1.10)
Glucose, Bld: 90 mg/dL (ref 70–99)
Potassium: 5 mEq/L (ref 3.5–5.3)
Sodium: 139 mEq/L (ref 135–145)

## 2012-01-07 LAB — T3, FREE: T3, Free: 3 pg/mL (ref 2.3–4.2)

## 2012-01-07 LAB — TSH: TSH: 1.606 u[IU]/mL (ref 0.350–4.500)

## 2012-01-07 LAB — T4, FREE: Free T4: 1.2 ng/dL (ref 0.80–1.80)

## 2012-01-07 LAB — VITAMIN D 25 HYDROXY (VIT D DEFICIENCY, FRACTURES): Vit D, 25-Hydroxy: 46 ng/mL (ref 30–89)

## 2012-01-08 LAB — MICROALBUMIN, URINE: Microalb, Ur: 2.51 mg/dL — ABNORMAL HIGH (ref 0.00–1.89)

## 2012-02-10 ENCOUNTER — Ambulatory Visit: Payer: BC Managed Care – PPO | Admitting: "Endocrinology

## 2012-02-18 ENCOUNTER — Ambulatory Visit: Payer: BC Managed Care – PPO | Admitting: "Endocrinology

## 2012-02-25 ENCOUNTER — Encounter: Payer: Self-pay | Admitting: Internal Medicine

## 2012-02-25 ENCOUNTER — Ambulatory Visit (INDEPENDENT_AMBULATORY_CARE_PROVIDER_SITE_OTHER): Payer: BC Managed Care – PPO | Admitting: Internal Medicine

## 2012-02-25 VITALS — BP 116/82 | HR 116 | Temp 99.2°F | Wt 218.0 lb

## 2012-02-25 DIAGNOSIS — E119 Type 2 diabetes mellitus without complications: Secondary | ICD-10-CM

## 2012-02-25 DIAGNOSIS — J069 Acute upper respiratory infection, unspecified: Secondary | ICD-10-CM

## 2012-02-25 DIAGNOSIS — I10 Essential (primary) hypertension: Secondary | ICD-10-CM

## 2012-02-25 DIAGNOSIS — N184 Chronic kidney disease, stage 4 (severe): Secondary | ICD-10-CM

## 2012-02-25 DIAGNOSIS — H6693 Otitis media, unspecified, bilateral: Secondary | ICD-10-CM

## 2012-02-25 DIAGNOSIS — E785 Hyperlipidemia, unspecified: Secondary | ICD-10-CM

## 2012-02-25 DIAGNOSIS — H6690 Otitis media, unspecified, unspecified ear: Secondary | ICD-10-CM

## 2012-02-25 NOTE — Progress Notes (Signed)
  Subjective:    Patient ID: [name], female    DOB: 09-18-1951, 61 y.o.   MRN: [id number]  HPI 61 year old white female with history of diabetes mellitus, hyperlipidemia, chronic kidney disease stage IV, hypertension in today with URI symptoms. Patient went to [place] returning home around February 8. Felt some sinus pressure during the flight home. Has had some respiratory congestion since that time. No fever or chills. Now has maxillary sinus pressure. Has ear pressure. No significant cough.    Review of Systems     Objective:   Physical Exam HEENT exam: Right TM is full and dull but not red. Left TM is full with good light reflex. Pharynx slightly injected without exudate. Neck is supple without adenopathy. Chest clear.        Assessment & Plan:  Bilateral otitis media  URI  Plan: Zithromax Z-Pak take 2 tablets day one followed by 1 tablet days 2 through 5 with 1 refill. If not better in 7 days, patient should get prescription refilled.

## 2012-02-25 NOTE — Patient Instructions (Signed)
Take Zithromax Z-PAK as directed. If not better in one week have prescription refill.

## 2012-03-01 ENCOUNTER — Encounter: Payer: Self-pay | Admitting: Internal Medicine

## 2012-03-01 NOTE — Patient Instructions (Signed)
Continue same medications. Continue followup with Dr. Lorrene Reid. Return in 6 months for fasting lipid panel liver functions and hemoglobin A1c. Try the diet and exercise.

## 2012-03-01 NOTE — Progress Notes (Signed)
Subjective:    Patient ID: [name], female    DOB: 1951/06/05, 61 y.o.   MRN: [id number]  HPI and 61 year old white female with history of hypertension, diabetes mellitus type 2, hyperlipidemia and chronic kidney disease. She is followed by Dr. Lorrene Reid, nephrologist for chronic kidney disease stage IV. In 04/10/2006 patient had right-sided flank pain and was thought to have nephrolithiasis. 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 Creatinine was mildly elevated at 1.5 . In January 2008 she was admitted with dehydration and renal insufficiency. Creatinine was 1.6 but improved with hydration to 1.39. She was on antibiotics for wound infection. She had nausea and vomiting. In July 2009 she was admitted with acute renal failure with creatinine up to 6.0. She had a left ureteral stent placed 06/23/2008 following a renogram showing poor function and poor clearance of tracer from the left kidney as well as increase in retention in the right kidney suggesting partial obstruction. Retrograde pyelogram showed no signs of obstruction. It was never clear exactly what caused the acute renal failure. It was thought to be perhaps acute on chronic injury. There was possible contribution from obstruction, taking ARB and metformin.  Patient has a history of hypertension and significant hypertriglyceridemia.  She has history of large right lower abdominal hernia repair by Dr. Theda Sers at [facility] in 04/10/2009. Subsequently underwent an incisional ventral hernia repair with mesh December 2011 by Dr. Dossie Der at [facility]. Baseline creatinine was 2.24 at this point in time. At one point she was on the kidney transplant list but has been removed.  She had a Pneumovax immunization may 2007, Tdap vaccine February 2005.  History of GE reflux, allergic rhinitis, dependent edema. History of osteopenia. History of carpal tunnel  syndrome in 10-Apr-2005. She gets annual influenza immunizations.  Possibly she has had a reaction recently to [place] in which she developed a fever which apparently is a side effect. We have discontinued that in the summer of 2012. She is now trying to manage hyperlipidemia with Lipitor. She also takes fish oil.  Patient also had ganglion cyst removed from right wrist 1959, [place], D&C 1988. Patient says all taste causes a cough. Is allergic to sulfa and TriCor.  Father with history of aortic valve replacement, hyperlipidemia, rheumatic heart disease. Mother with history of hypertension, hypothyroidism, hyperlipidemia and COPD. 2 sisters: one died in 04-10-04 with coronary artery disease. Both sisters with history of hyperlipidemia. One daughter in good health. Patient is married. Does not smoke occasional wine consumption.    Review of Systems  Constitutional: Positive for fatigue.  HENT: Positive for postnasal drip.   Eyes: Negative.   Respiratory: Negative.   Cardiovascular: Negative.   Gastrointestinal: Negative.   Genitourinary: Negative.   Musculoskeletal: Negative.   Neurological: Negative.   Hematological: Negative.   Psychiatric/Behavioral: Negative.        Objective:   Physical Exam  Nursing note and vitals reviewed. Constitutional: She is oriented to person, place, and time. She appears well-developed and well-nourished.  HENT:  Head: Normocephalic and atraumatic.  Right Ear: External ear normal.  Left Ear: External ear normal.  Mouth/Throat: Oropharynx is clear and moist. No oropharyngeal exudate.  Eyes: Conjunctivae and EOM are normal. Pupils are equal, round, and reactive to light. Right eye exhibits no discharge. Left eye exhibits no discharge. No scleral icterus.  Neck: Normal range of motion. Neck supple. No JVD  present. No thyromegaly present.  Cardiovascular: Normal rate, regular rhythm, normal heart sounds and intact distal pulses.   No murmur  heard. Pulmonary/Chest: Effort normal and breath sounds normal. She has no rales.  Abdominal: Soft. Bowel sounds are normal. She exhibits no mass. There is no tenderness. There is no rebound.  Genitourinary:       Deferred  Musculoskeletal: Normal range of motion. She exhibits no edema.  Lymphadenopathy:    She has no cervical adenopathy.  Neurological: She is alert and oriented to person, place, and time. She has normal reflexes. No cranial nerve deficit. Coordination normal.  Skin: Skin is warm and dry. No rash noted. She is not diaphoretic.  Psychiatric: She has a normal mood and affect. Her behavior is normal.          Assessment & Plan:  Chronic kidney disease stage IV  Hypertension  Hyperlipidemia  Diabetes mellitus type 2  With these  GE reflux  Osteopenia  Allergic rhinitis  Plan: Patient will continue same medications. Please note fever developed while on TriCor. Continue followup with Dr. Lorrene Reid. Return here in 6 months for fasting lipid panel liver functions and office visit along with hemoglobin A1c.  Diabetic eye exam done by Dr. Janyth Contes 8 g per ophthalmology. Colonoscopy done in may 2004. GYN exam is done by Dr. Gertie Fey. Patient had Pneumovax may 2007. Tetanus immunization February 2005.

## 2012-03-25 ENCOUNTER — Telehealth: Payer: Self-pay | Admitting: Internal Medicine

## 2012-03-25 ENCOUNTER — Ambulatory Visit
Admission: RE | Admit: 2012-03-25 | Discharge: 2012-03-25 | Disposition: A | Discharge status: Home / Self Care | Payer: BC Managed Care – PPO | Source: Ambulatory Visit | Attending: Internal Medicine | Admitting: Internal Medicine

## 2012-03-25 DIAGNOSIS — J329 Chronic sinusitis, unspecified: Secondary | ICD-10-CM

## 2012-03-25 IMAGING — CR DG SINUSES 1-2V
2 series · 2 of 2 positions shown · non-contrast
Comparison: None.

CLINICAL DATA: 60-year-old female with chronic sinusitis.
Bilateral maxilla pain.

PARANASAL SINUSES - 1-2 VIEW

[w waters]
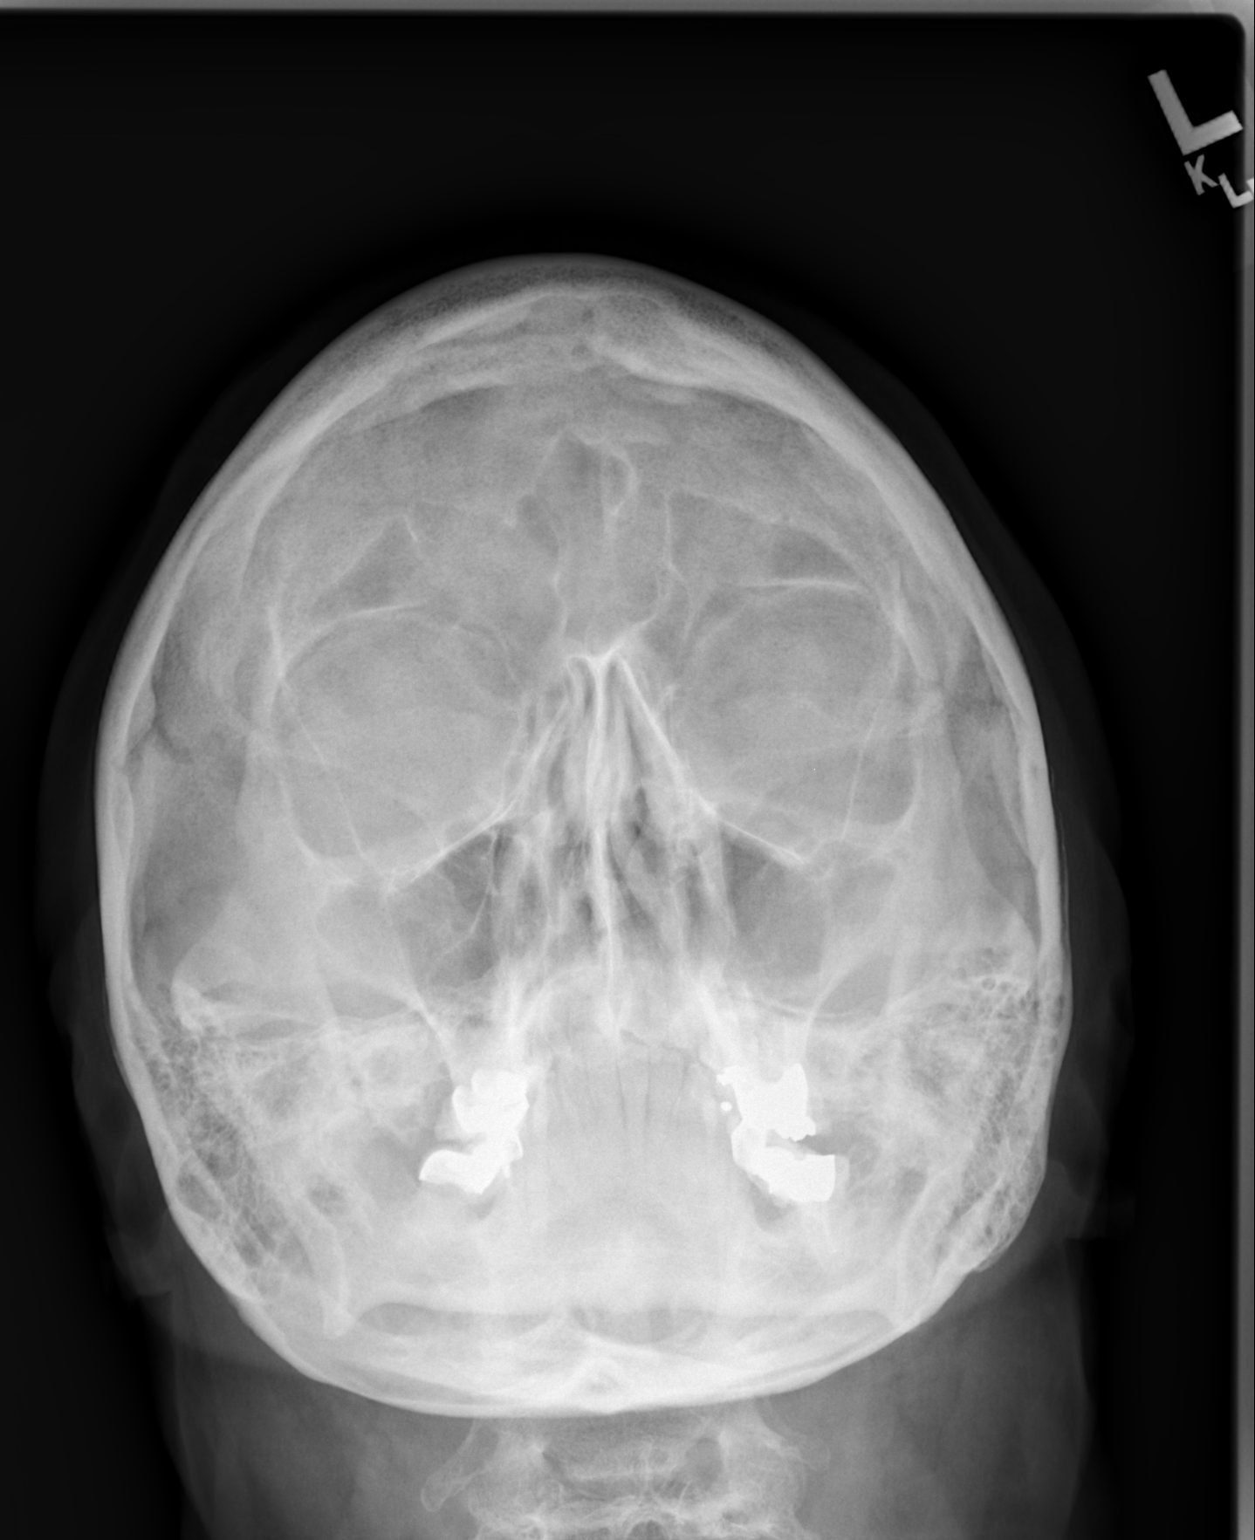

[w skull lat]
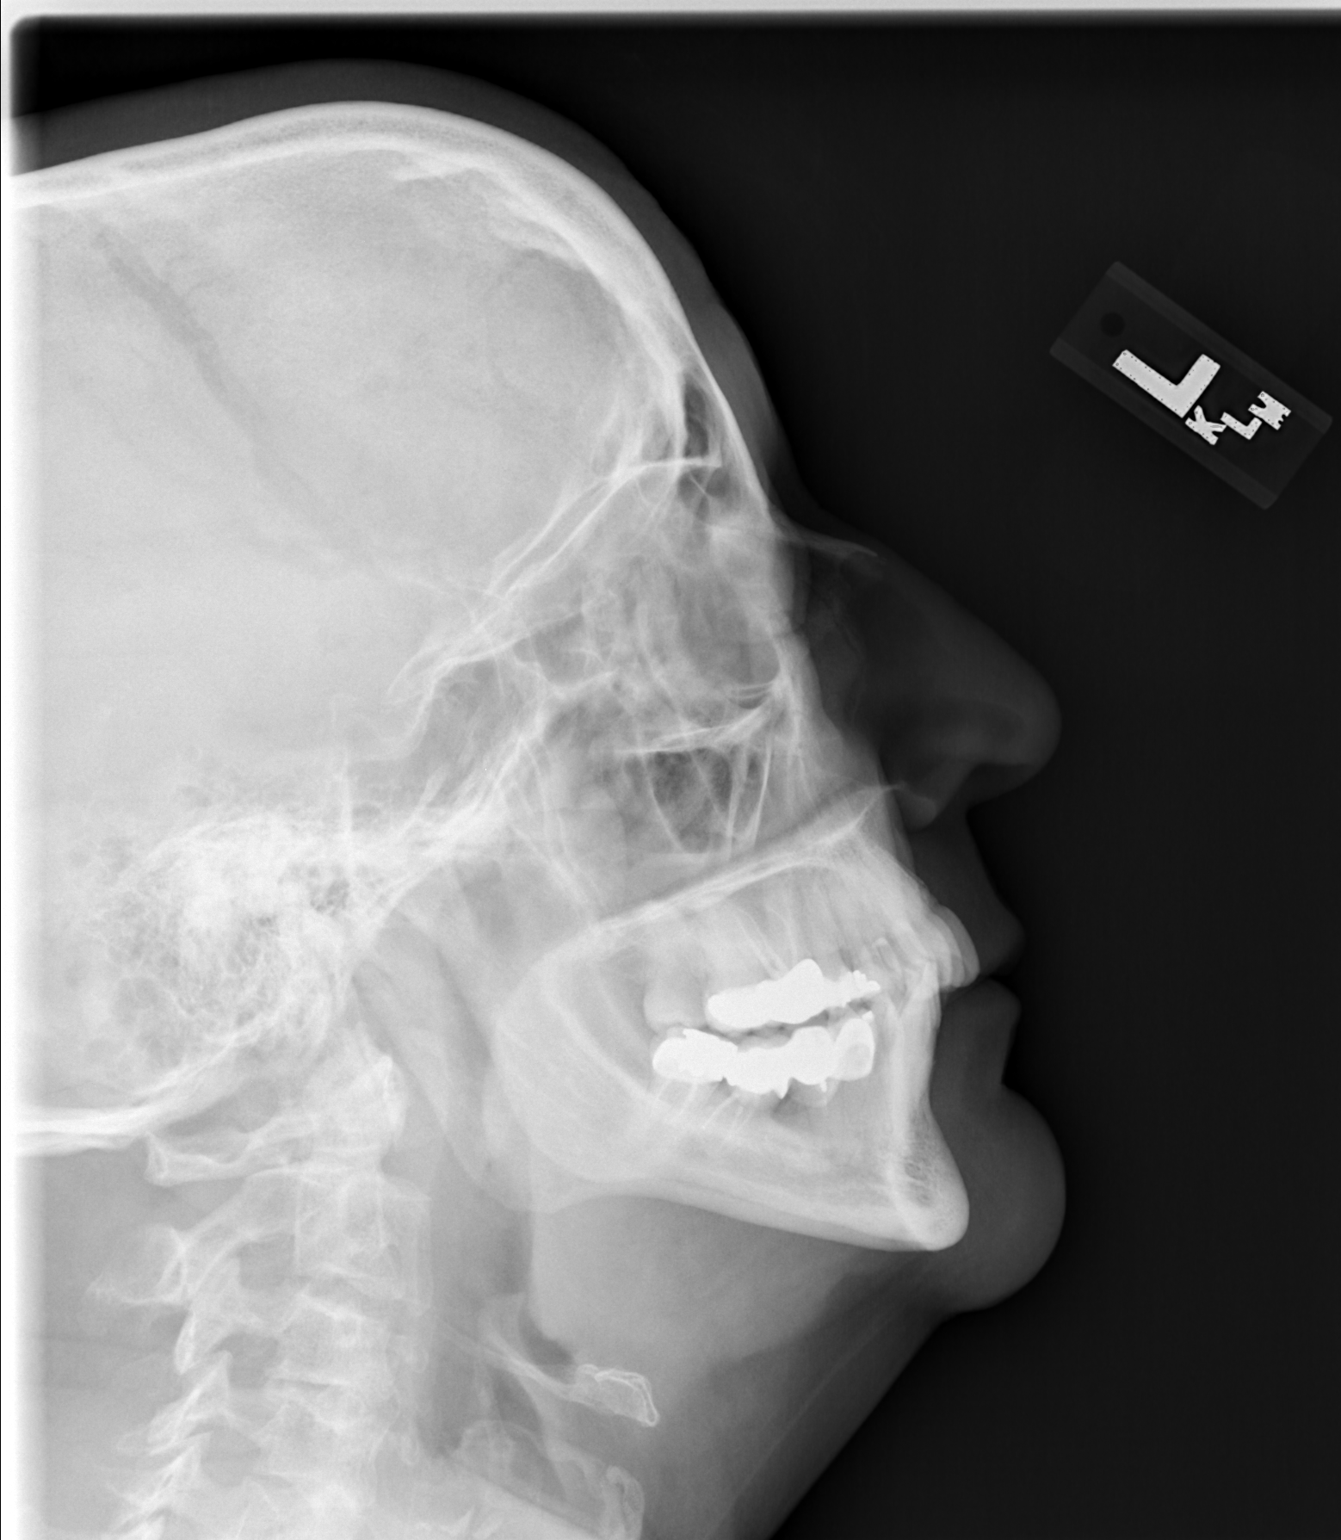

[2 of 2 positions shown; findings below may reference images not displayed]

FINDINGS: Two view radiographic sinus series.  The maxillary and
frontal sinuses appear to be normally pneumatized.  Sphenoid
sinuses appear to be normally pneumatized.  Ethmoids are less well
evaluated.  Mastoid air cell pneumatization appears to be
symmetric.
IMPRESSION: Negative two-view sinus series.

## 2012-03-25 MED ORDER — CEFUROXIME AXETIL 250 MG PO TABS: 250.0000 mg | ORAL_TABLET | Freq: Two times a day (BID) | ORAL | Status: AC

## 2012-03-25 NOTE — Telephone Encounter (Signed)
  Rx for Ceftin 250 mg 1 po bid x 10 days sent to [facility]. Also, patient instructed to go for plain sinus films at [place] today or tomorrow.

## 2012-03-27 DIAGNOSIS — J329 Chronic sinusitis, unspecified: Secondary | ICD-10-CM

## 2012-05-13 ENCOUNTER — Ambulatory Visit (INDEPENDENT_AMBULATORY_CARE_PROVIDER_SITE_OTHER): Payer: BC Managed Care – PPO | Admitting: "Endocrinology

## 2012-05-13 ENCOUNTER — Encounter: Payer: Self-pay | Admitting: "Endocrinology

## 2012-05-13 VITALS — BP 139/92 | HR 85 | Wt 217.4 lb

## 2012-05-13 DIAGNOSIS — E782 Mixed hyperlipidemia: Secondary | ICD-10-CM

## 2012-05-13 DIAGNOSIS — IMO0001 Reserved for inherently not codable concepts without codable children: Secondary | ICD-10-CM

## 2012-05-13 DIAGNOSIS — G609 Hereditary and idiopathic neuropathy, unspecified: Secondary | ICD-10-CM

## 2012-05-13 DIAGNOSIS — E11649 Type 2 diabetes mellitus with hypoglycemia without coma: Secondary | ICD-10-CM

## 2012-05-13 DIAGNOSIS — E1143 Type 2 diabetes mellitus with diabetic autonomic (poly)neuropathy: Secondary | ICD-10-CM

## 2012-05-13 DIAGNOSIS — N189 Chronic kidney disease, unspecified: Secondary | ICD-10-CM

## 2012-05-13 DIAGNOSIS — E1169 Type 2 diabetes mellitus with other specified complication: Secondary | ICD-10-CM

## 2012-05-13 DIAGNOSIS — E1149 Type 2 diabetes mellitus with other diabetic neurological complication: Secondary | ICD-10-CM

## 2012-05-13 DIAGNOSIS — G909 Disorder of the autonomic nervous system, unspecified: Secondary | ICD-10-CM

## 2012-05-13 DIAGNOSIS — K3189 Other diseases of stomach and duodenum: Secondary | ICD-10-CM

## 2012-05-13 DIAGNOSIS — E1142 Type 2 diabetes mellitus with diabetic polyneuropathy: Secondary | ICD-10-CM

## 2012-05-13 DIAGNOSIS — R1013 Epigastric pain: Secondary | ICD-10-CM

## 2012-05-13 DIAGNOSIS — E049 Nontoxic goiter, unspecified: Secondary | ICD-10-CM

## 2012-05-13 DIAGNOSIS — I10 Essential (primary) hypertension: Secondary | ICD-10-CM

## 2012-05-13 LAB — GLUCOSE, POCT (MANUAL RESULT ENTRY): POC Glucose: 97

## 2012-05-13 LAB — POCT GLYCOSYLATED HEMOGLOBIN (HGB A1C): Hemoglobin A1C: 5.4

## 2012-05-13 NOTE — Patient Instructions (Signed)
Followup visit in 6 months. Please try to fit and at least 30 minutes of exercise per day. 60 minutes would be better. Please watch the carbohydrate intake.

## 2012-05-13 NOTE — Progress Notes (Signed)
Subjective:  Patient Name: [name] Date of Birth: 09-Mar-1951  MRN: [id number]  [name]  presents to the office today for follow-up of her type 2 diabetes, obesity, goiter, autonomic neuropathy, tachycardia, peripheral neuropathy, edema, dyspepsia, chronic renal insufficiency, tinea pedis, and combined hyperlipidemia.  HISTORY OF PRESENT ILLNESS:   [name] is a 61 y.o. Caucasian woman. [name] was unaccompanied.  1. The patient was first referred to me on 12/19/05 by her primary care provider, Dr. Emeline General, for evaluation and co-management of type 2 diabetes and obesity.  2. During the past 6 years we've tried her on several different medication regimens. When she developed chronic renal insufficiency in 2009, all metformin-containing medications were discontinued. In April 2010 I started her on Victoza at a dose of 0.6 mg/day. As of her last clinic visit on 06/18/11 she had increased the Victoza dose to 0.6 plus 6 clicks. In the interim, she has done surprisingly well. She has more recently been taking a Victoza dose of 0.6 plus 4 clicks. She thought that was the correct dose.  3. Pertinent Review of Systems:  Constitutional: The patient feels "good".  Eyes: Vision is good as long as she wears her glasses. Her last dilated eye examination was on 05/17/11. There were no signs of diabetic retinopathy. She has a FU appointment scheduled for 5/18. There are no significant eye complaints. Neck: The patient has no complaints of anterior neck swelling, soreness, tenderness,  pressure, discomfort, or difficulty swallowing.  Heart: Heart rate increases with exercise or other physical activity. The patient has no complaints of palpitations, irregular heat beats, chest pain, or chest pressure. Gastrointestinal: She not take 2 Lovaza every AM and 2 every other night, which cause her less GI discomfort. As a result, she now has fewer GERD symptoms. Bowel movents seem normal. The patient has  no complaints of excessive hunger, acid reflux, stomach aches or pains, diarrhea, or constipation. Legs: Muscle mass and strength seem normal. There are no complaints of numbness, tingling, burning, or pain. She still has some leg edema at times.  Feet: There are no obvious foot problems. There are no complaints of numbness, tingling, burning, or pain. No edema is noted. Hands: She sometimes awakens at night with numbness in one or the other hands in the C6 distribution, especially if she sleeps with her head on her forearm.  Hypoglycemia: None GU: She saw Dr. Lorrene Reid about 5 months ago. Her renal status has stabilized so much that [facility] switched her to the inactive transplant list. 4. BG printout: BGs vary from 95 to 116. Her morning blood sugars vary from the 90s-110s.   PAST MEDICAL, FAMILY, AND SOCIAL HISTORY:  Past Medical History  Diagnosis Date  . Hypercholesteremia   . Allergic rhinitis   . GERD (gastroesophageal reflux disease)   . Type II or unspecified type diabetes mellitus without mention of complication, not stated as uncontrolled   . Essential hypertension, benign   . Menopause   . Osteopenia   . Dependent edema   . Carbuncle, trunk   . Carpal tunnel syndrome   . Cyclical vomiting   . Chronic kidney disease   . Diabetes mellitus type II   . Goiter   . Obesity   . Diabetic peripheral neuropathy associated with type 2 diabetes mellitus   . Diabetic autonomic neuropathy   . Tachycardia   . Combined hyperlipidemia associated with type 2 diabetes mellitus   . Dyspepsia     Family History  Problem Relation  Age of Onset  . COPD Mother   . Hypertension Mother   . Thyroid disease Mother   . Heart disease Father   . Hyperlipidemia Father   . Diabetes Father   . Heart disease Sister   . Cancer Maternal Grandfather   . Diabetes Paternal Grandmother     Current outpatient prescriptions:aspirin 81 MG chewable tablet, Chew 81 mg by mouth daily.  , Disp: , Rfl: ;  atenolol  (TENORMIN) 100 MG tablet, Take 100 mg by mouth daily.  , Disp: , Rfl: ;  atorvastatin (LIPITOR) 40 MG tablet, Take 40 mg by mouth daily.  , Disp: , Rfl: ;  Cholecalciferol (VITAMIN D) 2000 UNITS CAPS, Take by mouth.  , Disp: , Rfl: ;  Liraglutide (VICTOZA [place]), Inject 0.6 mg into the skin daily.  , Disp: , Rfl:  Multiple Vitamins-Minerals (MULTIVITAMIN WITH MINERALS) tablet, Take 1 tablet by mouth daily.  , Disp: , Rfl: ;  omega-3 acid ethyl esters (LOVAZA) 1 G capsule, Take 1 g by mouth daily.  , Disp: , Rfl: ;  omeprazole (PRILOSEC) 20 MG capsule, Take 20 mg by mouth daily.  , Disp: , Rfl: ;  sodium bicarbonate 650 MG tablet, Take 650 mg by mouth 2 (two) times daily. 3 in am 3 in pm, Disp: , Rfl:   Allergies as of 05/13/2012 - Review Complete 05/13/2012  Allergen Reaction Noted  . Fenofibrate  06/03/2011  . Orange fruit (citrus)  05/13/2012  . Ramipril Cough 06/03/2011  . Sulfa antibiotics  04/30/2011    1. Work and Family: The patient is still working 40 hours or more per week. She is thinking about retirement next year. 2. Activities: She is doing some yoga. She still goes to a Physiological scientist twice a week.  3. Smoking, alcohol, or drugs: None 4. Primary Care Provider: Elby Showers, MD, MD 5. Nephrology: Dr. Jamal Maes 6. Urology: Dr. Raynelle Bring  ROS: She pulled a small imbedded tick off her right antecubital area last night. There are no other significant problems involving her other body systems.   Objective:  Vital Signs:  BP 139/92  Pulse 85  Wt 217 lb 6.4 oz (98.612 kg)   Ht Readings from Last 3 Encounters:  01/07/12 5' 3.75" (1.619 m)  06/27/11 5\' 4"  (1.626 m)  06/03/11 5\' 4"  (1.626 m)   Wt Readings from Last 3 Encounters:  05/13/12 217 lb 6.4 oz (98.612 kg)  02/25/12 218 lb (98.884 kg)  01/07/12 216 lb (97.977 kg)   There is no height on file to calculate BSA.  PHYSICAL EXAM:  Constitutional: The patient appears healthy and well nourished. She is alert and  bright. Her weight has increased by 5 pounds. Face: The face appears normal.  Eyes: There is no obvious arcus or proptosis. Moisture appears normal. Mouth: The oropharynx and tongue appear normal. Oral moisture is normal. Neck: The neck appears to be visibly normal. No carotid bruits are noted. The thyroid gland is 20 grams in size. Both lobes are within normal limits today. The consistency of the lobes is also normal today. The thyroid gland is not tender to palpation. Lungs: The lungs are clear to auscultation. Air movement is good. Heart: Heart rate and rhythm are regular.Heart sounds S1 and S2 are normal. I did not appreciate any pathologic cardiac murmurs. Abdomen: The abdomen is enlarged in size. Bowel sounds are normal. There is no obvious hepatomegaly, splenomegaly, or other mass effect.  Arms: Muscle size and bulk are normal  for age. She has about a 3 mm red papule at the site of the tick being imbedded. The area does not look infected. Hands: There is no obvious tremor. Phalangeal and metacarpophalangeal joints are normal. Palmar muscles are normal. Palmar skin is normal. Palmar moisture is also normal. Legs: Muscles appear normal for age. She has trace edema. Feet: Feet are normally formed. Dorsalis pedal pulses are normal 2+ bilaterally. She has tinea pedis of both feet, especially on the heels. She has 2+ calluses. Neurologic: Strength is normal for age in both the upper and lower extremities. Muscle tone is normal. Sensation to touch is normal in both legs and feet.    LAB DATA:Labs: HbA1c is 5.4% today, compared with 5.5% at last visit Labs: 01/06/12: Cholesterol 218, triglycerides 351, HDL 34, and LDL 114 ( increased from 99).  TSH 1.608, free T4 1.20, potassium 5.8, creatinine 2.66, vitamin D 46   Assessment and Plan:   ASSESSMENT:  1. Type 2 diabetes mellitus: Patient continues to do well on Victoza.  2. Hypoglycemia: None 3. Peripheral neuropathy: She has no signs or symptom  of the peripheral neuropathy today. 4. Autonomic neuropathy with tachycardia: With the continued improvement in blood glucose control, the neuropathy and tachycardia have improved. The longer we can keep her hemoglobin A1c in a good range, the better her neuropathy and tachycardia will be. 5. Hyperlipidemia: The patient is under treatment with Lovaza and Lipitor. Her LDL in January was higher. It would be beneficial for the patient to lose weight. 6. Goiter: Her thyroid gland is actually within normal limits for size today. The patient was euthyroid in January. The waxing and waning of her thyroid gland size suggest that the patient has intermittent Hashimoto's disease activity.  7. Chronic renal insufficiency: Her renal insufficiency appears to have stabilized. 8. Hypertension: Dr. Lorrene Reid is managing her hypertension. Weight loss would be beneficial for this problem as well. 9. Dyspepsia/GERD: She is doing better.  PLAN: 1. Diagnostic: Repeat lipid panel and TFTs in 6 months. 2. Therapeutic: I again asked the patient to try to walk an hour per day. If she can reduce her weight, we will not need to increase her meds for hyperlipidemia and hypertension.  3. Patient education: We again discussed the importance of eating right and exercising right for control of her blood pressure, her weight, and her diabetes. 4. Follow-up: 6 months  Level of Service: This visit lasted in excess of 40 minutes. More than 50% of the visit was devoted to counseling.  Sherrlyn Hock

## 2012-05-25 ENCOUNTER — Other Ambulatory Visit: Payer: Self-pay | Admitting: *Deleted

## 2012-06-25 ENCOUNTER — Other Ambulatory Visit: Payer: Self-pay

## 2012-06-25 MED ORDER — ATORVASTATIN CALCIUM 40 MG PO TABS: 40.0000 mg | ORAL_TABLET | Freq: Every day | ORAL | Status: DC

## 2012-06-30 ENCOUNTER — Encounter: Payer: Self-pay | Admitting: Internal Medicine

## 2012-06-30 ENCOUNTER — Other Ambulatory Visit (INDEPENDENT_AMBULATORY_CARE_PROVIDER_SITE_OTHER): Payer: BC Managed Care – PPO | Admitting: Internal Medicine

## 2012-06-30 ENCOUNTER — Ambulatory Visit (INDEPENDENT_AMBULATORY_CARE_PROVIDER_SITE_OTHER): Payer: BC Managed Care – PPO | Admitting: Internal Medicine

## 2012-06-30 VITALS — BP 122/84 | HR 96 | Temp 98.2°F | Ht 63.75 in | Wt 220.0 lb

## 2012-06-30 DIAGNOSIS — L255 Unspecified contact dermatitis due to plants, except food: Secondary | ICD-10-CM

## 2012-06-30 DIAGNOSIS — E119 Type 2 diabetes mellitus without complications: Secondary | ICD-10-CM

## 2012-06-30 DIAGNOSIS — L259 Unspecified contact dermatitis, unspecified cause: Secondary | ICD-10-CM

## 2012-06-30 DIAGNOSIS — Z7901 Long term (current) use of anticoagulants: Secondary | ICD-10-CM

## 2012-06-30 DIAGNOSIS — L237 Allergic contact dermatitis due to plants, except food: Secondary | ICD-10-CM

## 2012-06-30 DIAGNOSIS — E785 Hyperlipidemia, unspecified: Secondary | ICD-10-CM

## 2012-06-30 LAB — LIPID PANEL
Cholesterol: 235 mg/dL — ABNORMAL HIGH (ref 0–200)
HDL: 32 mg/dL — ABNORMAL LOW (ref >39)
LDL Cholesterol: 125 mg/dL — ABNORMAL HIGH (ref 0–99)
Total CHOL/HDL Ratio: 7.3 Ratio
Triglycerides: 391 mg/dL — ABNORMAL HIGH (ref <150)
VLDL: 78 mg/dL — ABNORMAL HIGH (ref 0–40)

## 2012-06-30 LAB — HEPATIC FUNCTION PANEL
ALT: 21 U/L (ref 0–35)
AST: 22 U/L (ref 0–37)
Albumin: 3.9 g/dL (ref 3.5–5.2)
Alkaline Phosphatase: 92 U/L (ref 39–117)
Bilirubin, Direct: 0.1 mg/dL (ref 0.0–0.3)
Indirect Bilirubin: 0.3 mg/dL (ref 0.0–0.9)
Total Bilirubin: 0.4 mg/dL (ref 0.3–1.2)
Total Protein: 6.6 g/dL (ref 6.0–8.3)

## 2012-06-30 LAB — HEMOGLOBIN A1C
Hgb A1c MFr Bld: 6.1 % — ABNORMAL HIGH (ref <5.7)
Mean Plasma Glucose: 128 mg/dL — ABNORMAL HIGH (ref <117)

## 2012-06-30 MED ORDER — METHYLPREDNISOLONE ACETATE 80 MG/ML IJ SUSP: 80.0000 mg | Freq: Once | INTRAMUSCULAR | Status: DC

## 2012-06-30 MED ORDER — METHYLPREDNISOLONE ACETATE 80 MG/ML IJ SUSP
80.0000 mg | Freq: Once | INTRAMUSCULAR | Status: AC
  Administered 2012-06-30: 80 mg via INTRAMUSCULAR

## 2012-06-30 NOTE — Patient Instructions (Addendum)
You have been given injection of Depo-Medrol IM today in the office. Take Benadryl for itching. Use calamine lotion on rash.

## 2012-06-30 NOTE — Progress Notes (Signed)
  Subjective:    Patient ID: [name], female    DOB: 1951/03/05, 61 y.o.   MRN: [id number]  HPI 61 year old white female with history of chronic kidney disease, adult onset diabetes, hypertension, hyperlipidemia in today with rash. She feels it is likely to be due to poison ivy. She's been out in the yard. Has had several episodes in the past and says it asked just like previous bout of poison ivy. Symptoms of been present for several days. Itching has not gone away and she feels it may be spreading. Has erythematous papular rash on several body areas.    Review of Systems     Objective:   Physical Exam erythematous papular rash not vesicular right olecranon fossa; no rash on abdomen although she says it itches. Similar erythematous papular rash on right neck and right shoulder. No rash on face. Chest clear to auscultation; cardiac exam regular rate and rhythm       Assessment & Plan:   Contact dermatitis  Diabetes mellitus  Chronic kidney disease  Hypertension  Hyperlipidemia  Plan: Offered patient a Sterapred dosepak but she prefers an injection. Given 80 mg IM Depo-Medrol today. May take Benadryl for itching. Use calamine lotion on rash.

## 2012-06-30 NOTE — Addendum Note (Signed)
Addended by: Brett Canales on: 06/30/2012 02:35 PM   Modules accepted: Orders

## 2012-07-09 ENCOUNTER — Other Ambulatory Visit: Payer: BC Managed Care – PPO | Admitting: Internal Medicine

## 2012-07-10 ENCOUNTER — Encounter: Payer: Self-pay | Admitting: Internal Medicine

## 2012-07-10 ENCOUNTER — Ambulatory Visit (INDEPENDENT_AMBULATORY_CARE_PROVIDER_SITE_OTHER): Payer: BC Managed Care – PPO | Admitting: Internal Medicine

## 2012-07-10 VITALS — BP 124/86 | HR 84 | Temp 97.9°F | Wt 217.0 lb

## 2012-07-10 DIAGNOSIS — E785 Hyperlipidemia, unspecified: Secondary | ICD-10-CM

## 2012-07-10 DIAGNOSIS — E119 Type 2 diabetes mellitus without complications: Secondary | ICD-10-CM

## 2012-07-10 DIAGNOSIS — E781 Pure hyperglyceridemia: Secondary | ICD-10-CM

## 2012-07-10 DIAGNOSIS — I10 Essential (primary) hypertension: Secondary | ICD-10-CM

## 2012-07-10 NOTE — Progress Notes (Signed)
  Subjective:    Patient ID: [name], female    DOB: August 01, 1951, 61 y.o.   MRN: [id number]  HPI 61 year old white female for six-month recheck. History of hypertension hyperlipidemia obesity diabetes mellitus2, chronic kidney disease stage IV, dyspepsia, history of tachycardia. Patient unable to tolerate TriCor because of fever. Sees Dr. Tobe Sos for endocrine issues and diabetic management. Sees Dr. Jamal Maes for management of chronic kidney disease which has been stable. Saw Dr. Janyth Contes for diabetic eye exam may 2013. Patient is due for mammogram December 2013 wants to have bone density study done at that time. She had Pneumovax immunization may 2007, tetanus immunization February 2005. Gets annual influenza immunization.  Is allergic to sulfa and all takes causes a cough in addition to intolerance of TriCor.  Had ganglion cyst removed from right wrist 1959, [place], D&C 1988. Had deep venous thrombosis of right lower extremity December 2007. Fractured left wrist at age 64 or 59 years in the 17s. Fractured left great toe 1991.  Additional history: Osteopenia, carpal tunnel syndrome February 2006. History of allergic rhinitis. Is menopausal.  Social history: Is married has one adult daughter who is an Engineering geologist in [place].  Recent lab work shows significant hypertriglyceridemia and hypercholesterolemia. She is on Lipitor 40 mg daily. Not sure that she is dieting and exercising as much and she couldn't. She has a low HDL cholesterol of 32. Microalbumin collected and urine October 2012.  Colonoscopy done May 2004.    Review of Systems has developed some palpable abnormalities on her arms which seem to be small lipomas.     Objective:   Physical Exam small lipomas left forearm and left upper arm as well as right arm. Neck is supple without JVD. Chest clear to auscultation. Cardiac exam regular rate and rhythm normal S1 and S2; extremities without edema. Diabetic foot  exam: No ulcers. No fungal infections. Pulses are normal in feet. She is alert and oriented. She is obese. Not diaphoretic. No evidence of cervical adenopathy or axillary adenopathy.        Assessment & Plan:  Obesity  Chronic kidney disease stage IV  Hypertension-well controlled on current regimen  Hyperlipidemia-not well controlled: Suggest patient be seen at [facility] lipid clinic for further evaluation. Intolerant of TriCor.  Diabetes mellitus-fairly well controlled  See recent Hgb AIC  History of osteopenia-to get bone density study at time of mammogram December 2013 order written  History of allergic rhinitis-stable  Small lipomas on arms:  Plan: Referral to lipid clinic; return for physical examination in 6 months. Will have mammogram and bone density study December 2013.

## 2012-07-10 NOTE — Patient Instructions (Addendum)
Appointment with [place] lipid clinic. Continue to see specialists or chronic problems. Continue same medications. Return in 6 months for physical examination.

## 2012-09-14 ENCOUNTER — Telehealth: Payer: Self-pay | Admitting: Internal Medicine

## 2012-09-14 NOTE — Telephone Encounter (Signed)
Patient informed. 

## 2012-09-14 NOTE — Telephone Encounter (Signed)
Been diagnosed with DM.  She states she has been taking Lipitor for YEARS.  She wants to know should she come off of the Lipitor and is there a chance that her DM would/could reverse r/t to coming off of Lipitor?  Or, should she just continue as she is doing?  Please advise and thanks!

## 2012-09-14 NOTE — Telephone Encounter (Signed)
She is overweight and that also contributes to DM. She must take something for high triglycerides and cannot tolerate Tricor because of fever. I think cardiologist would want her on Lipid lowering medication.

## 2012-10-20 ENCOUNTER — Encounter: Payer: Self-pay | Admitting: Internal Medicine

## 2012-10-20 ENCOUNTER — Ambulatory Visit (INDEPENDENT_AMBULATORY_CARE_PROVIDER_SITE_OTHER): Payer: BC Managed Care – PPO | Admitting: Internal Medicine

## 2012-10-20 ENCOUNTER — Other Ambulatory Visit: Payer: Self-pay | Admitting: Internal Medicine

## 2012-10-20 VITALS — BP 124/82 | HR 88 | Temp 99.5°F | Wt 224.0 lb

## 2012-10-20 DIAGNOSIS — R52 Pain, unspecified: Secondary | ICD-10-CM

## 2012-10-20 DIAGNOSIS — N289 Disorder of kidney and ureter, unspecified: Secondary | ICD-10-CM

## 2012-10-20 DIAGNOSIS — R7989 Other specified abnormal findings of blood chemistry: Secondary | ICD-10-CM

## 2012-10-20 DIAGNOSIS — E790 Hyperuricemia without signs of inflammatory arthritis and tophaceous disease: Secondary | ICD-10-CM

## 2012-10-20 DIAGNOSIS — N184 Chronic kidney disease, stage 4 (severe): Secondary | ICD-10-CM

## 2012-10-20 DIAGNOSIS — Z1231 Encounter for screening mammogram for malignant neoplasm of breast: Secondary | ICD-10-CM

## 2012-10-20 DIAGNOSIS — M109 Gout, unspecified: Secondary | ICD-10-CM

## 2012-10-20 DIAGNOSIS — R609 Edema, unspecified: Secondary | ICD-10-CM

## 2012-10-20 DIAGNOSIS — M1030 Gout due to renal impairment, unspecified site: Secondary | ICD-10-CM

## 2012-10-21 ENCOUNTER — Ambulatory Visit
Admission: RE | Admit: 2012-10-21 | Discharge: 2012-10-21 | Disposition: A | Discharge status: Home / Self Care | Payer: BC Managed Care – PPO | Source: Ambulatory Visit | Attending: Internal Medicine | Admitting: Internal Medicine

## 2012-10-21 DIAGNOSIS — R609 Edema, unspecified: Secondary | ICD-10-CM

## 2012-10-21 DIAGNOSIS — R52 Pain, unspecified: Secondary | ICD-10-CM

## 2012-10-21 IMAGING — CR DG FOOT COMPLETE 3+V*L*
3 series · 3 of 3 positions shown · non-contrast
Comparison: None.

CLINICAL DATA: Left foot pain with redness and swelling, no known
injury, history of diabetes

LEFT FOOT - COMPLETE 3+ VIEW

[view not recorded (1 of 3)]
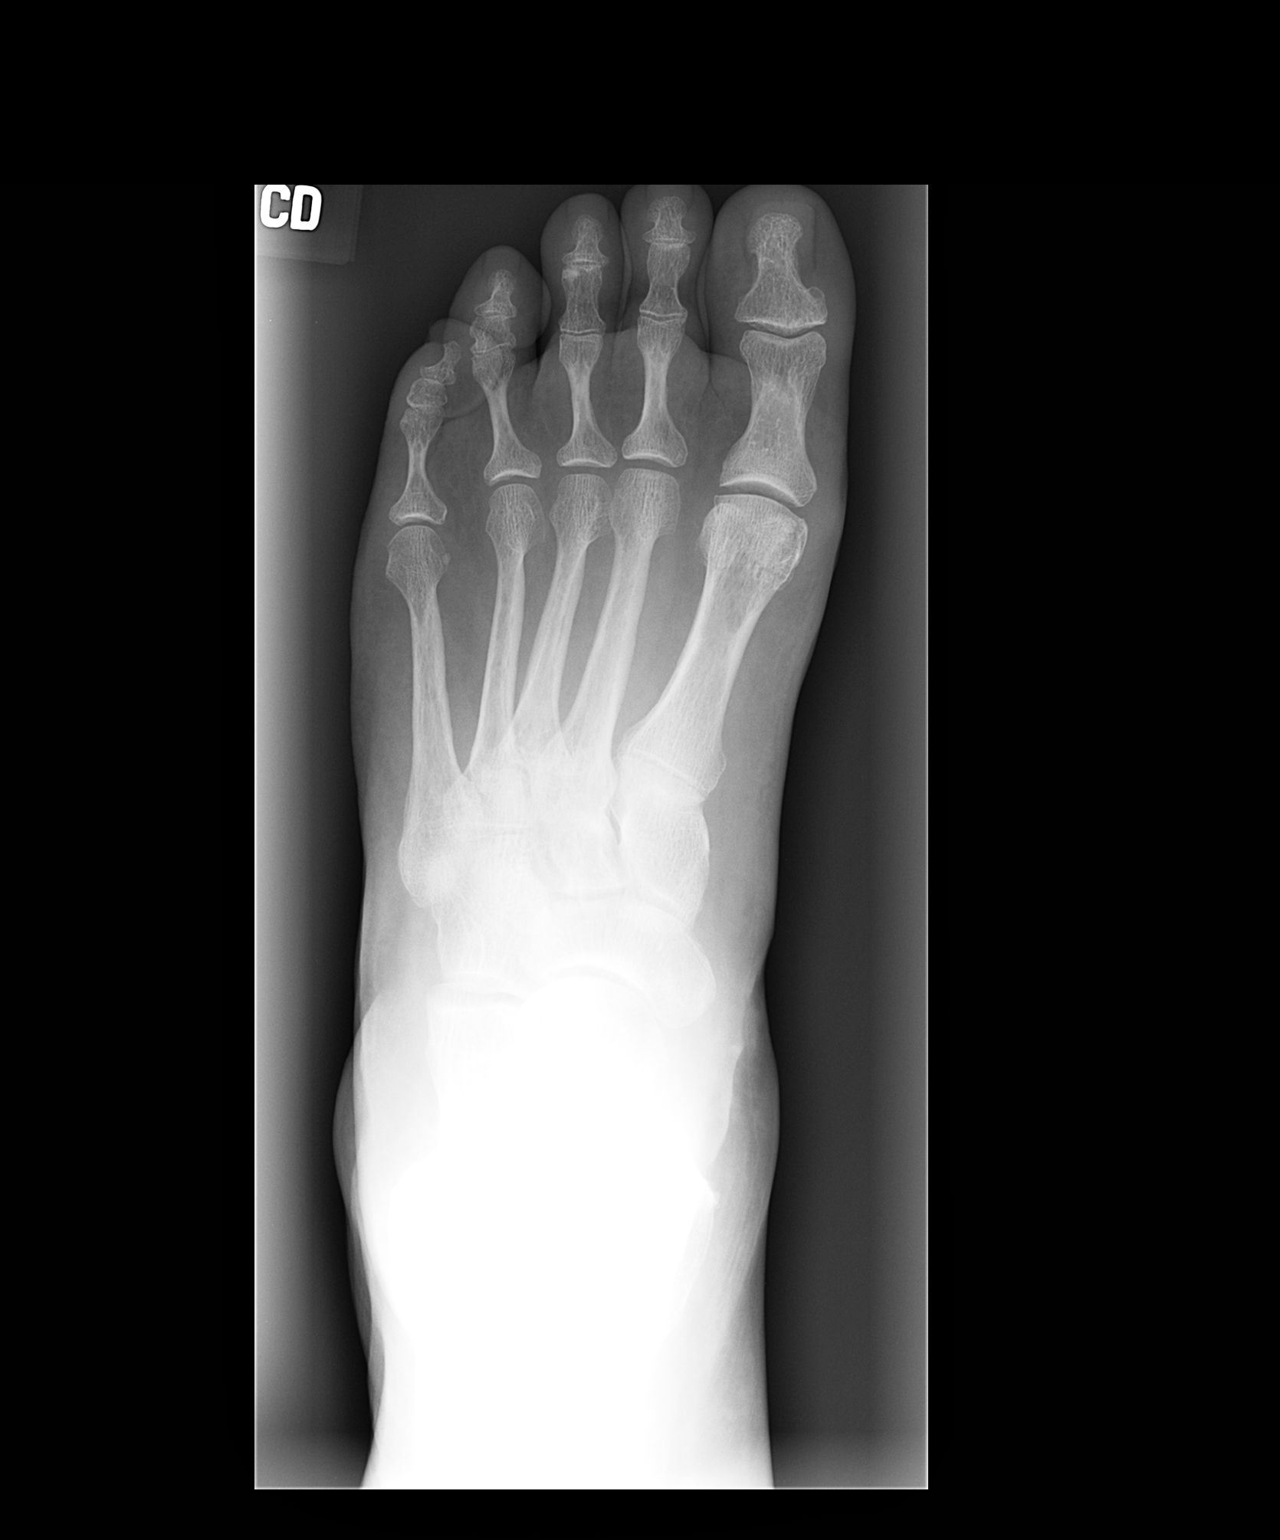

[view not recorded (2 of 3)]
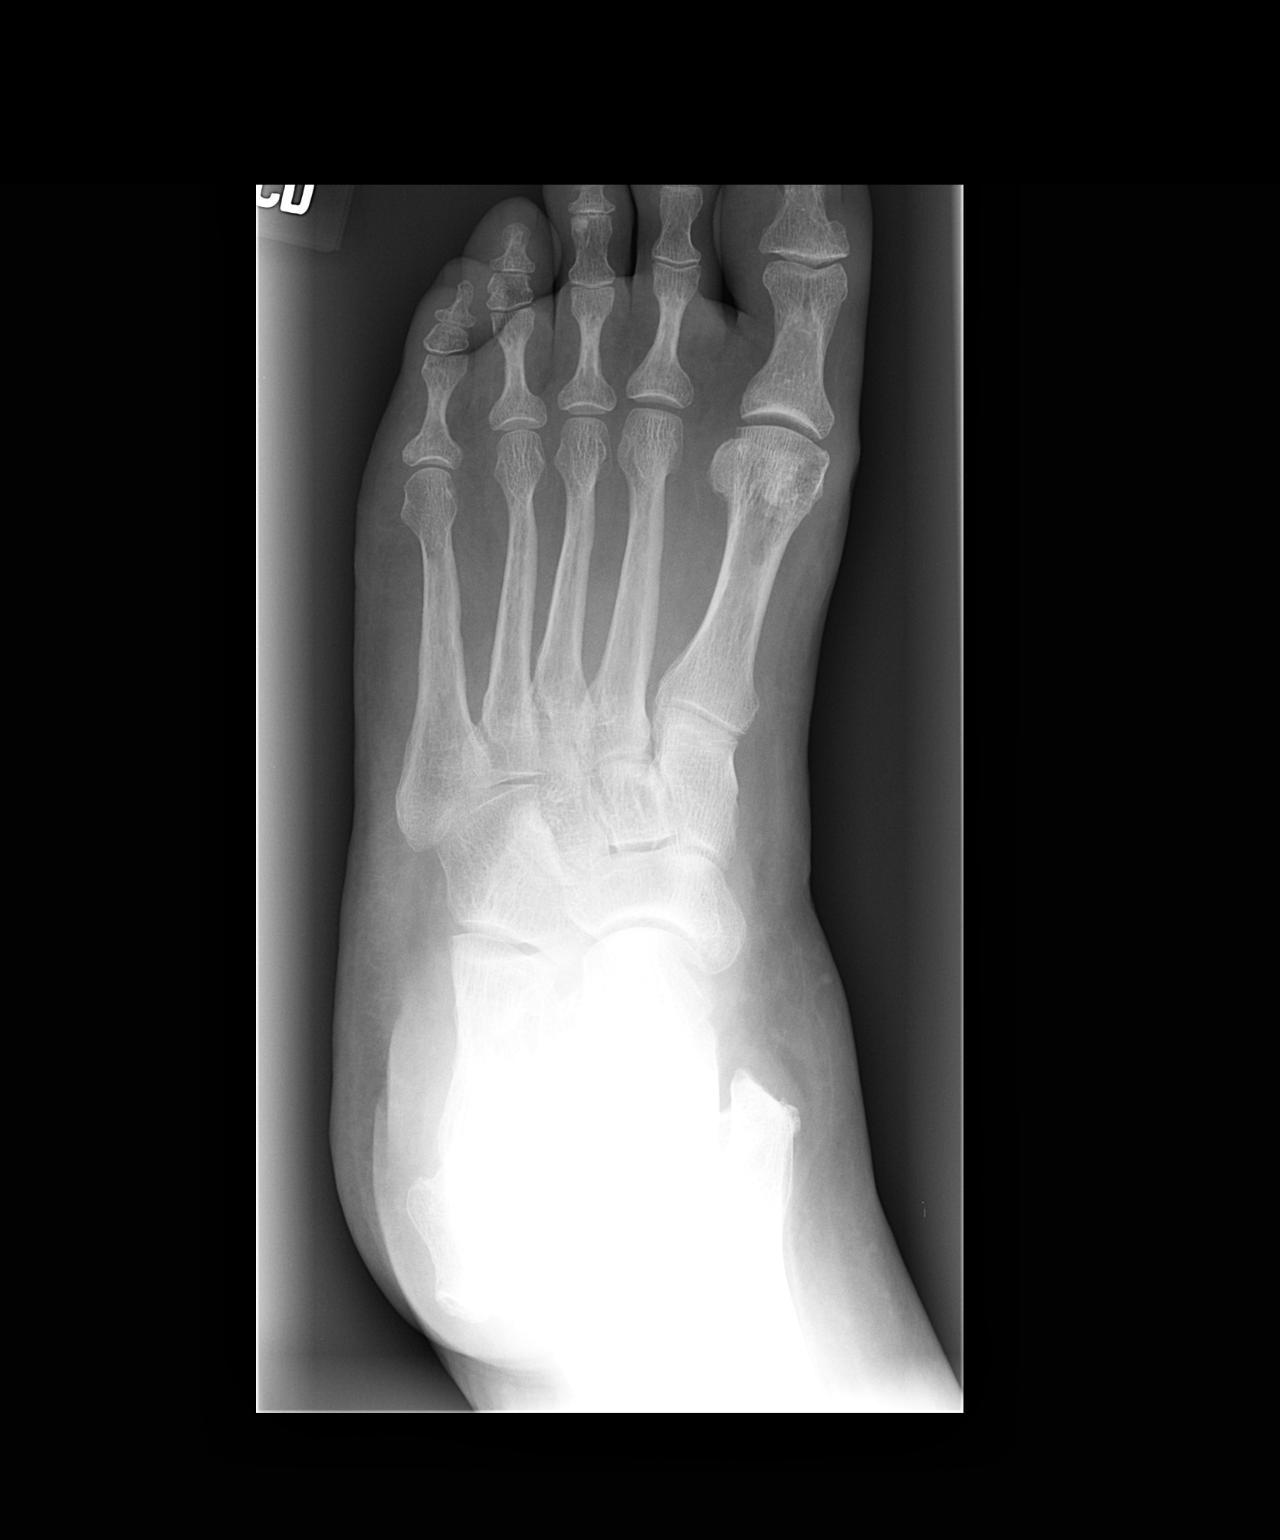

[view not recorded (3 of 3)]
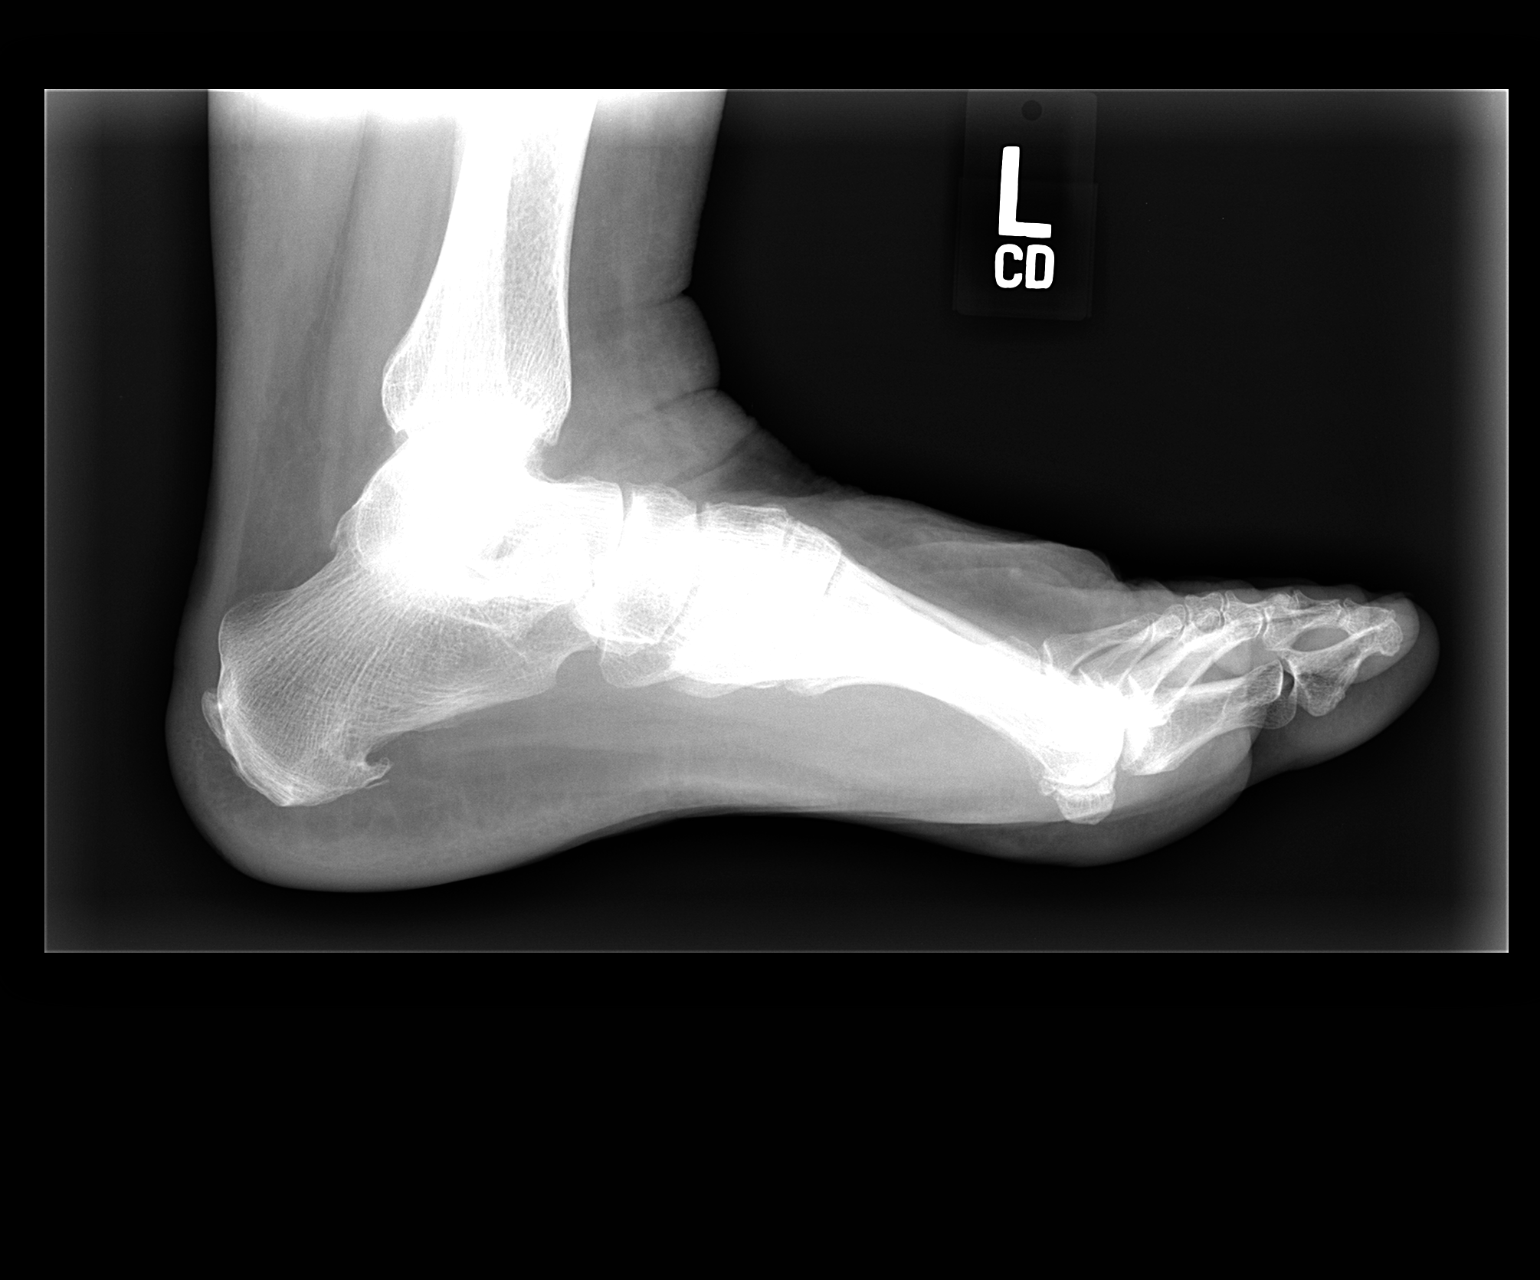

[3 of 3 positions shown; findings below may reference images not displayed]

FINDINGS: There is only minimal degenerative change at the left
first MTP joint.  No erosion is seen.  No soft tissue calcification
is noted.  On the lateral view degenerative calcaneal spurs are
present.
IMPRESSION: 1.  Mild degenerative change of the left first MTP joint.  No
erosion.
2.  Degenerative calcaneal spurs.

## 2012-10-22 ENCOUNTER — Encounter: Payer: Self-pay | Admitting: Internal Medicine

## 2012-10-22 ENCOUNTER — Ambulatory Visit (INDEPENDENT_AMBULATORY_CARE_PROVIDER_SITE_OTHER): Payer: BC Managed Care – PPO | Admitting: Internal Medicine

## 2012-10-22 ENCOUNTER — Other Ambulatory Visit: Payer: Self-pay | Admitting: Internal Medicine

## 2012-10-22 VITALS — BP 126/80 | HR 96 | Temp 97.8°F | Wt 224.0 lb

## 2012-10-22 DIAGNOSIS — M8580 Other specified disorders of bone density and structure, unspecified site: Secondary | ICD-10-CM

## 2012-10-22 DIAGNOSIS — M109 Gout, unspecified: Secondary | ICD-10-CM

## 2012-10-22 DIAGNOSIS — N184 Chronic kidney disease, stage 4 (severe): Secondary | ICD-10-CM

## 2012-10-22 DIAGNOSIS — M1030 Gout due to renal impairment, unspecified site: Secondary | ICD-10-CM

## 2012-10-22 DIAGNOSIS — N289 Disorder of kidney and ureter, unspecified: Secondary | ICD-10-CM

## 2012-10-24 DIAGNOSIS — M109 Gout, unspecified: Secondary | ICD-10-CM | POA: Insufficient documentation

## 2012-10-24 NOTE — Progress Notes (Signed)
  Subjective:    Patient ID: [name], female    DOB: 1951-09-20, 61 y.o.   MRN: [id number]  HPI 61 year old white female with chronic kidney disease stage IV developed pain redness and tenderness in left MTP joint recently. Was seen at [facility] on [address] by Dr. Nira Retort Sunday, October 20 and was placed on doxycycline. CBC and other lab work was drawn. We do not have those results at the present time but have requested them. Recently has been out of town and did have one glass of wine.    Review of Systems     Objective:   Physical Exam redness and tenderness left foot MTP joint. No other joint involvement. No fever or shaking chills.        Assessment & Plan:  X-ray of left foot ordered  Acute gouty attack  Gout likely due to renal impairment  Doubt cellulitis  Plan: Sterapred DS 10 mg 6 day dosepak. Obtain CBC and blood chemistries done at urgent care. Return in 48 hours. Continue doxycycline.

## 2012-10-24 NOTE — Patient Instructions (Addendum)
Finish course of doxycycline prescribed by urgent care physician. Finish course of prednisone prescribed 2 days ago. Call if gouty arthritis returns.  Get influenza immunization in a couple of weeks. We will fax information to Dr. Lorrene Reid

## 2012-10-24 NOTE — Progress Notes (Signed)
  Subjective:    Patient ID: [name], female    DOB: [birth year], 61 y.o.   MRN: [id number]  HPI 61 year old white female with stage IV chronic kidney disease who had apparent gouty attack this past weekend. Was seen at urgent care and was started on doxycycline. However we have received lab work from that visit at urgent care on [address]. Patient had white blood cell count of 7700, hemoglobin 14.7 g. She had uric acid that was elevated at 10.3 normal being up to 7.0. Serum creatinine there was 2.03. Sodium potassium chloride and bicarbonate were normal. Recent x-ray that I ordered of left foot was negative. 2 days ago I started her on Sterapred DS 10 mg 6 day dosepak and she is markedly improved.    Review of Systems     Objective:   Physical Exam Less redness and tenderness of left first MTP joint       Assessment & Plan:  Improvement acute gouty arthritis left first MTP joint  Gout likely due to renal impairment  Elevated serum uric acid  Stage IV chronic kidney disease  Plan: Patient does not have appointment to see a nephrologist until January. Talked with her about starting allopurinol. She is reluctant to do this. Wants to see if she has another gout   attack. Talked with her about diet which would be important to follow with elevated serum uric acid. I suspect she will have another attack if she's not on  medication due to renal impairment.  Finish course of doxycycline prescribed previously by urgent care physician. Get influenza immunization at a later time.

## 2012-10-24 NOTE — Patient Instructions (Addendum)
Begin  Sterapred DS 10 mg 6 day dosepak  As directed. Continue doxycycline. Return in 48 hours.

## 2012-10-28 ENCOUNTER — Ambulatory Visit (INDEPENDENT_AMBULATORY_CARE_PROVIDER_SITE_OTHER): Payer: BC Managed Care – PPO | Admitting: Internal Medicine

## 2012-10-28 DIAGNOSIS — Z23 Encounter for immunization: Secondary | ICD-10-CM

## 2012-10-29 DIAGNOSIS — Z23 Encounter for immunization: Secondary | ICD-10-CM

## 2012-11-10 ENCOUNTER — Ambulatory Visit (INDEPENDENT_AMBULATORY_CARE_PROVIDER_SITE_OTHER): Payer: BC Managed Care – PPO | Admitting: "Endocrinology

## 2012-11-10 ENCOUNTER — Encounter: Payer: Self-pay | Admitting: "Endocrinology

## 2012-11-10 VITALS — BP 135/84 | HR 99 | Wt 221.1 lb

## 2012-11-10 DIAGNOSIS — E1169 Type 2 diabetes mellitus with other specified complication: Secondary | ICD-10-CM

## 2012-11-10 DIAGNOSIS — E1142 Type 2 diabetes mellitus with diabetic polyneuropathy: Secondary | ICD-10-CM

## 2012-11-10 DIAGNOSIS — E11649 Type 2 diabetes mellitus with hypoglycemia without coma: Secondary | ICD-10-CM

## 2012-11-10 DIAGNOSIS — M109 Gout, unspecified: Secondary | ICD-10-CM

## 2012-11-10 DIAGNOSIS — K3189 Other diseases of stomach and duodenum: Secondary | ICD-10-CM

## 2012-11-10 DIAGNOSIS — E669 Obesity, unspecified: Secondary | ICD-10-CM

## 2012-11-10 DIAGNOSIS — E1143 Type 2 diabetes mellitus with diabetic autonomic (poly)neuropathy: Secondary | ICD-10-CM

## 2012-11-10 DIAGNOSIS — N189 Chronic kidney disease, unspecified: Secondary | ICD-10-CM

## 2012-11-10 DIAGNOSIS — E782 Mixed hyperlipidemia: Secondary | ICD-10-CM

## 2012-11-10 DIAGNOSIS — R1013 Epigastric pain: Secondary | ICD-10-CM

## 2012-11-10 DIAGNOSIS — I10 Essential (primary) hypertension: Secondary | ICD-10-CM

## 2012-11-10 DIAGNOSIS — E119 Type 2 diabetes mellitus without complications: Secondary | ICD-10-CM

## 2012-11-10 DIAGNOSIS — E049 Nontoxic goiter, unspecified: Secondary | ICD-10-CM

## 2012-11-10 DIAGNOSIS — G909 Disorder of the autonomic nervous system, unspecified: Secondary | ICD-10-CM

## 2012-11-10 DIAGNOSIS — R000 Tachycardia, unspecified: Secondary | ICD-10-CM

## 2012-11-10 DIAGNOSIS — E1149 Type 2 diabetes mellitus with other diabetic neurological complication: Secondary | ICD-10-CM

## 2012-11-10 LAB — POCT GLYCOSYLATED HEMOGLOBIN (HGB A1C): Hemoglobin A1C: 5.8

## 2012-11-10 LAB — GLUCOSE, POCT (MANUAL RESULT ENTRY): POC Glucose: 101 mg/dl — AB (ref 70–99)

## 2012-11-10 NOTE — Progress Notes (Signed)
Subjective:  Patient Name: [name] Date of Birth: 09/14/1951  MRN: [id number]  [name]  presents to the office today for follow-up of her type 2 diabetes, obesity, goiter, autonomic neuropathy, tachycardia, peripheral neuropathy, edema, dyspepsia, chronic renal insufficiency, tinea pedis, and combined hyperlipidemia.  HISTORY OF PRESENT ILLNESS:   [name] is a 61 y.o. Caucasian woman. [name] was unaccompanied.  1. The patient was first referred to me on 12/19/05 by her primary care provider, Dr. Emeline General, for evaluation and co-management of type 2 diabetes and obesity.  2. During the past 7 years we've tried her on several different medication regimens. When she developed chronic renal insufficiency in 2009, all metformin-containing medications were discontinued. In April 2010 I started her on Victoza at a dose of 0.6 mg/day. As of her last clinic visit on 05/13/12 she had increased the Victoza dose to 0.6 plus 4 clicks. In the interim, she has been fairly healthy. She did have an episode of gout several weeks ago, for which she was treated with steroids and an antibiotic. She was not started on allopurinol. She does take atorvastatin, vitamin D, atenolol, and sodium bicarbonate. She stopped omeprazole and cut back a lot on fish oil/Lovazal.   3. Pertinent Review of Systems:  Constitutional: The patient feels "good".  Eyes: Vision is good as long as she wears her glasses. Her last dilated eye examination was this Fall. There were no signs of diabetic retinopathy. There are no significant eye complaints. Neck: The patient has no complaints of anterior neck swelling, soreness, tenderness,  pressure, discomfort, or difficulty swallowing.  Heart: Heart rate increases with exercise or other physical activity. The patient has no complaints of palpitations, irregular heat beats, chest pain, or chest pressure. She will have a scheduled stress test at [facility] in January.    Gastrointestinal: She reduced her fish oil, which has reduced her reflux.  Bowel movents seem normal. The patient has no complaints of excessive hunger, acid reflux, stomach aches or pains, diarrhea, or constipation. Legs: Muscle mass and strength seem normal. There are no complaints of numbness, tingling, burning, or pain. She still has some leg edema at times.  Feet: She has resolving gout in her left great toe. Although the gout is resolving, it is still painful to walk. There are no other obvious foot problems. There are no complaints of numbness, tingling, burning, or pain. No edema is noted. Hands: She occasionally, but much less frequently, awakens at night with numbness in one or the other hands in the C6 distribution, especially if she sleeps with her head on her forearm.  Hypoglycemia: None GU: She will see Dr. Lorrene Reid in [place] next week. When she had labs done for gout, her serum creatinine was 2.038, which was actually a bit better. She remains on the inactive transplant list. 4. BG printout: She has only checked her BGs 5 times in the past 4 weeks. BGs vary from 99 to 117.    PAST MEDICAL, FAMILY, AND SOCIAL HISTORY:  Past Medical History  Diagnosis Date  . Hypercholesteremia   . Allergic rhinitis   . GERD (gastroesophageal reflux disease)   . Type II or unspecified type diabetes mellitus without mention of complication, not stated as uncontrolled   . Essential hypertension, benign   . Menopause   . Osteopenia   . Dependent edema   . Carbuncle, trunk   . Carpal tunnel syndrome   . Cyclical vomiting   . Chronic kidney disease   . Diabetes  mellitus type II   . Goiter   . Obesity   . Diabetic peripheral neuropathy associated with type 2 diabetes mellitus   . Diabetic autonomic neuropathy   . Tachycardia   . Combined hyperlipidemia associated with type 2 diabetes mellitus   . Dyspepsia     Family History  Problem Relation Age of Onset  . COPD Mother   . Hypertension Mother    . Thyroid disease Mother   . Heart disease Father   . Hyperlipidemia Father   . Diabetes Father   . Heart disease Sister   . Cancer Maternal Grandfather   . Diabetes Paternal Grandmother     Current outpatient prescriptions:atenolol (TENORMIN) 100 MG tablet, Take 100 mg by mouth daily.  , Disp: , Rfl: ;  atorvastatin (LIPITOR) 40 MG tablet, Take 1 tablet (40 mg total) by mouth daily., Disp: 30 tablet, Rfl: 5;  Cholecalciferol (VITAMIN D) 2000 UNITS CAPS, Take by mouth.  , Disp: , Rfl: ;  Liraglutide (VICTOZA Smith), Inject 0.6 mg into the skin daily.  , Disp: , Rfl:  Multiple Vitamins-Minerals (MULTIVITAMIN WITH MINERALS) tablet, Take 1 tablet by mouth daily.  , Disp: , Rfl: ;  omega-3 acid ethyl esters (LOVAZA) 1 G capsule, Take 1 g by mouth daily.  , Disp: , Rfl: ;  ONE TOUCH ULTRA TEST test strip, , Disp: , Rfl: ;  sodium bicarbonate 650 MG tablet, Take 650 mg by mouth 2 (two) times daily. 3 in am 3 in pm, Disp: , Rfl: ;  aspirin 81 MG chewable tablet, Chew 81 mg by mouth daily.  , Disp: , Rfl:  doxycycline (DORYX) 100 MG EC tablet, Take 100 mg by mouth 2 (two) times daily., Disp: , Rfl: ;  omeprazole (PRILOSEC) 20 MG capsule, Take 20 mg by mouth daily.  , Disp: , Rfl:  Current facility-administered medications:methylPREDNISolone acetate (DEPO-MEDROL) injection 80 mg, 80 mg, Intramuscular, Once, Elby Showers, MD  Allergies as of 11/10/2012 - Review Complete 10/22/2012  Allergen Reaction Noted  . Fenofibrate  06/03/2011  . Orange fruit (citrus)  05/13/2012  . Ramipril Cough 06/03/2011  . Sulfa antibiotics  04/30/2011    1. Work and Family: The patient is still working 40 hours or more per week. She is thinking about retirement in 2014, probably in August. 2. Activities: She is doing some yoga. She still goes to a [employer] twice a week.  3. Smoking, alcohol, or drugs: None 4. Primary Care Provider: Elby Showers, MD 5. Nephrology: Dr. Jamal Maes 6. Urology: Dr. Raynelle Bring  ROS: There are no other significant problems involving her other body systems.   Objective:  Vital Signs:  BP 135/84  Pulse 99  Wt 221 lb 1.6 oz (100.29 kg)   Ht Readings from Last 3 Encounters:  06/30/12 5' 3.75" (1.619 m)  01/07/12 5' 3.75" (1.619 m)  06/27/11 5\' 4"  (1.626 m)   Wt Readings from Last 3 Encounters:  11/10/12 221 lb 1.6 oz (100.29 kg)  10/22/12 224 lb (101.606 kg)  10/20/12 224 lb (101.606 kg)   There is no height on file to calculate BSA.  PHYSICAL EXAM:  Constitutional: The patient appears healthy, but obese. She is alert and bright. Her weight has increased by 4 pounds since her last visit with me. Face: The face appears normal.  Eyes: There is no obvious arcus or proptosis. Moisture appears normal. Mouth: The oropharynx and tongue appear normal. Oral moisture is normal. Neck: The neck appears to be  visibly normal. No carotid bruits are noted. The thyroid gland is 20 grams in size. Both lobes are within normal limits again today. The consistency of the lobes is also normal again today. The thyroid gland is not tender to palpation. Lungs: The lungs are clear to auscultation. Air movement is good. Heart: Heart rate and rhythm are regular.Heart sounds S1 and S2 are normal. I did not appreciate any pathologic cardiac murmurs. Abdomen: The abdomen is enlarged in size. Bowel sounds are normal. There is no obvious hepatomegaly, splenomegaly, or other mass effect.  Arms: Muscle size and bulk are normal for age.  Hands: There is no obvious tremor. Phalangeal and metacarpophalangeal joints are normal. Palmar muscles are normal. Palmar skin is normal. Palmar moisture is also normal. Legs: Muscles appear normal for age. She has no edema. Feet: Feet are normally formed. Dorsalis pedal pulses are normal 2+ bilaterally. She has redness (rubor), increased heat (calor), very mild swelling (tumor), and pain to palpation(dolor) of her left great toe, all c/w gout. By  history this is slowly resolving. She may need allopurinol if the uric acid does not fully resolve. Given her history of CRI, it is quite likely that her uric acid may stay elevated. Her tinea pedis has essentially resolved. She has 2+ calluses. Neurologic: Strength is normal for age in both the upper and lower extremities. Muscle tone is normal. Sensation to touch is normal in both legs, but is slightly decreased in both heels.    LAB DATA:Labs: HbA1c is 5.8%, compared with 5.4% at last visit and with 5.5% at the prior visit Labs: 06/30/12: Cholesterol 235, triglycerides 391, HDL 32, LDL 121 Labs: 01/06/12: Cholesterol 218, triglycerides 351, HDL 34, and LDL 114 (increased from 99).  TSH 1.608, free T4 1.20, potassium 5.8, creatinine 2.66, vitamin D 46   Assessment and Plan:   ASSESSMENT:  1. Type 2 diabetes mellitus: Patient's HbA1c is slightly higher, c/w both weight gain and recent steroid use. She continues to do well on Victoza.  2. Hypoglycemia: None 3. Peripheral neuropathy: She has minimal signs, but no symptom of  peripheral neuropathy today. 4. Autonomic neuropathy with tachycardia: With improvement in blood glucose control over time, the neuropathy and tachycardia have improved. The longer we can keep her hemoglobin A1c in a good range, the better her neuropathy and tachycardia will be. 5. Hyperlipidemia: The patient is under treatment with Lovaza and Lipitor. Her cholesterol, triglycerides, and LDL were higher in July than in January. She has reduced the frequency of taking Lovaza due to GI adverse effects. She may need higher doses of atorvastatin, tighter diet, more exercise, and tighter BG control. It would be beneficial for the patient to lose weight. 6. Goiter: Her thyroid gland is again within normal limits for size today. The patient was euthyroid in January. The waxing and waning of her thyroid gland size suggest that the patient has intermittent Hashimoto's disease activity.  7.  Chronic renal insufficiency: Her renal insufficiency appears to have stabilized. 8. Hypertension: Dr. Lorrene Reid is managing her hypertension. Exercise and weight loss would be beneficial for this problem as well. 9. Dyspepsia/GERD: She is doing better since reducing her Lovaza.  10. Gout: She has had a recent flare up of gout. She may need to be on allopurinol over time.  11. Obesity: Her weight is slightly higher.   PLAN: 1. Diagnostic: Repeat CMP, lipid panel, TFT, and urinary microalbumin/creatinine ratio in January. 2. Therapeutic: I again asked the patient to try to walk an  hour per day. If she can reduce her weight, we will not need to increase her meds for hyperlipidemia and hypertension.  3. Patient education: We again discussed the importance of eating right and exercising right for control of her blood pressure, her weight, and her diabetes. 4. Follow-up: 4 months  Level of Service: This visit lasted in excess of 50 minutes. More than 50% of the visit was devoted to counseling.  [name]

## 2012-11-10 NOTE — Patient Instructions (Signed)
Follow up visit in 4 months. Please have lab tests done in January.

## 2012-12-10 ENCOUNTER — Telehealth: Payer: Self-pay

## 2012-12-10 ENCOUNTER — Encounter: Payer: Self-pay | Admitting: Internal Medicine

## 2012-12-10 ENCOUNTER — Ambulatory Visit (INDEPENDENT_AMBULATORY_CARE_PROVIDER_SITE_OTHER): Payer: BC Managed Care – PPO | Admitting: Internal Medicine

## 2012-12-10 VITALS — BP 126/80 | HR 88 | Temp 98.3°F | Wt 226.0 lb

## 2012-12-10 DIAGNOSIS — N189 Chronic kidney disease, unspecified: Secondary | ICD-10-CM

## 2012-12-10 DIAGNOSIS — E785 Hyperlipidemia, unspecified: Secondary | ICD-10-CM

## 2012-12-10 DIAGNOSIS — I10 Essential (primary) hypertension: Secondary | ICD-10-CM

## 2012-12-10 DIAGNOSIS — E119 Type 2 diabetes mellitus without complications: Secondary | ICD-10-CM

## 2012-12-10 DIAGNOSIS — J069 Acute upper respiratory infection, unspecified: Secondary | ICD-10-CM

## 2012-12-10 NOTE — Progress Notes (Signed)
  Subjective:    Patient ID: [name], female    DOB: 16-Mar-1951, 61 y.o.   MRN: [id number]  HPI 61 year-old white female with history of hypertension, hyperlipidemia, chronic kidney disease, type 2 diabetes mellitus in today with acute URI symptoms and had onset 2 days ago. Seems to be getting worse she says. No sore throat or ear pain. No headache. Nasal congestion and a lot of postnasal drip. No cough. No sputum production. No fever or shaking chills. Has had influenza vaccine. Has had Pneumovax vaccine. Has malaise and fatigue.    Review of Systems     Objective:   Physical Exam HEENT exam: TMs are clear. Pharynx is clear. Sounds nasally congested. Neck is supple without adenopathy. Chest clear.        Assessment & Plan:  Acute URI  Chronic kidney disease  Hypertension  Hyperlipidemia  Type 2 diabetes mellitus   Plan: Zithromax Z-Pak take 2 tablets day one followed by 1 tablet by mouth days 2 through 5.   If not better in one week have prescription refilled.

## 2012-12-10 NOTE — Telephone Encounter (Signed)
Appointment made for this pm

## 2012-12-10 NOTE — Telephone Encounter (Signed)
Need to see with her medical issues.

## 2012-12-10 NOTE — Telephone Encounter (Signed)
Patient calls today with symptoms of a sinus infection. Burning under eyes and nose. Clear nasal drainage, slight sore throat from post nasal drip. Fever unknown. Would prefer not to come in, but wants an antibiotic called in.

## 2012-12-10 NOTE — Patient Instructions (Addendum)
Take Zithromax Z-Pak 2 tablets day one followed by 1 tablet days 2 through 5. If not better in one week have prescription refilled

## 2012-12-18 ENCOUNTER — Other Ambulatory Visit: Payer: BC Managed Care – PPO

## 2012-12-18 ENCOUNTER — Ambulatory Visit: Payer: BC Managed Care – PPO

## 2012-12-18 ENCOUNTER — Ambulatory Visit
Admission: RE | Admit: 2012-12-18 | Discharge: 2012-12-18 | Disposition: A | Discharge status: Home / Self Care | Payer: BC Managed Care – PPO | Source: Ambulatory Visit | Attending: Internal Medicine | Admitting: Internal Medicine

## 2012-12-18 DIAGNOSIS — Z1231 Encounter for screening mammogram for malignant neoplasm of breast: Secondary | ICD-10-CM

## 2012-12-18 IMAGING — MG MM DIGITAL SCREENING BILAT
5 series · 5 of 5 positions shown · non-contrast
Comparison: [DATE]

CLINICAL DATA: Screening.

DIGITAL BILATERAL SCREENING MAMMOGRAM WITH CAD

[R CC]
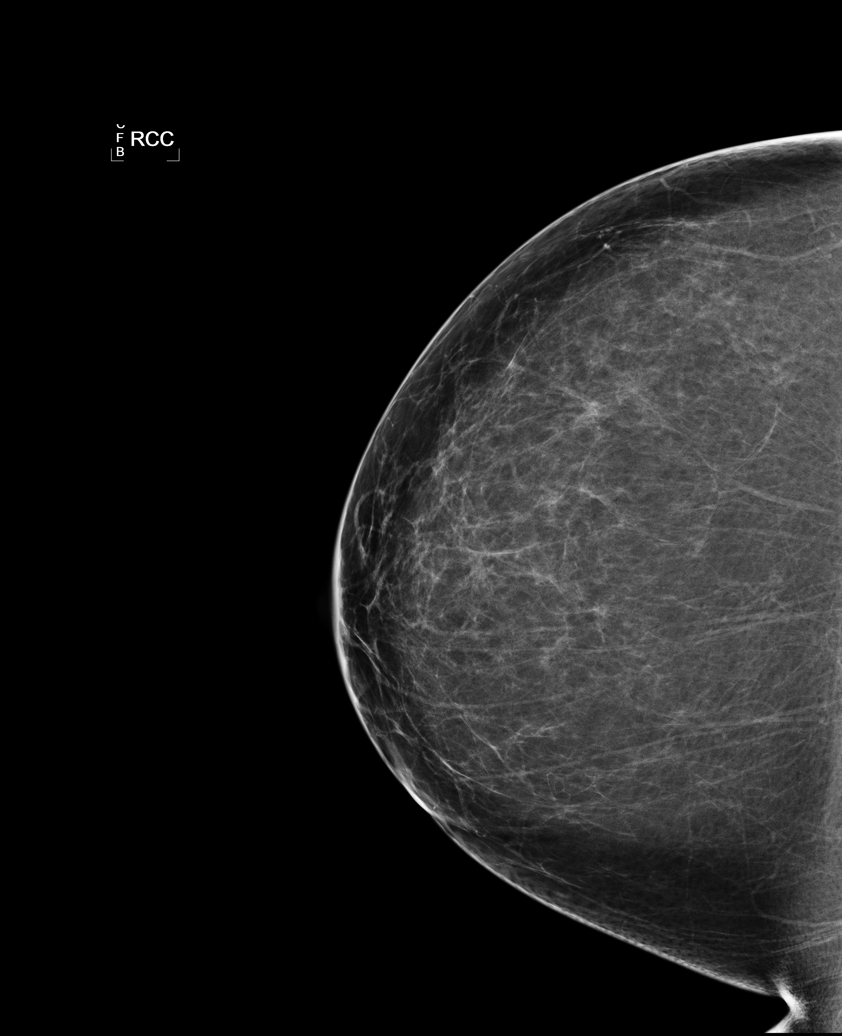

[L CC]
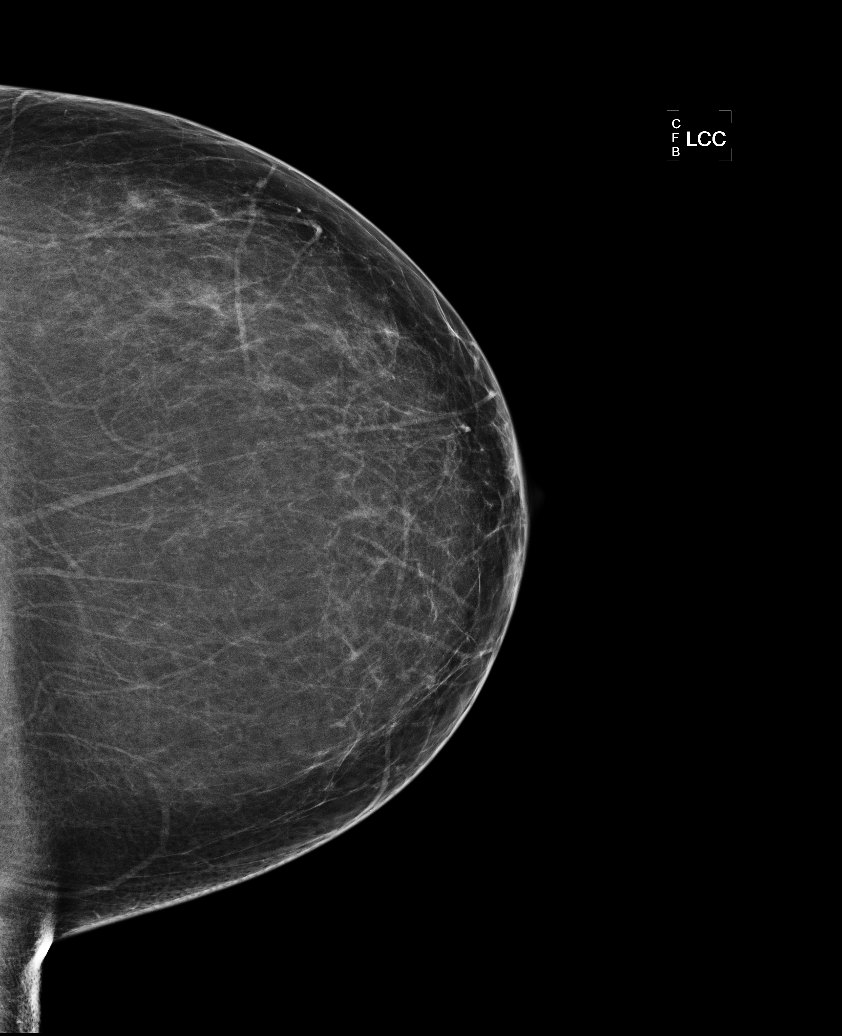

[L MLO (1 of 2)]
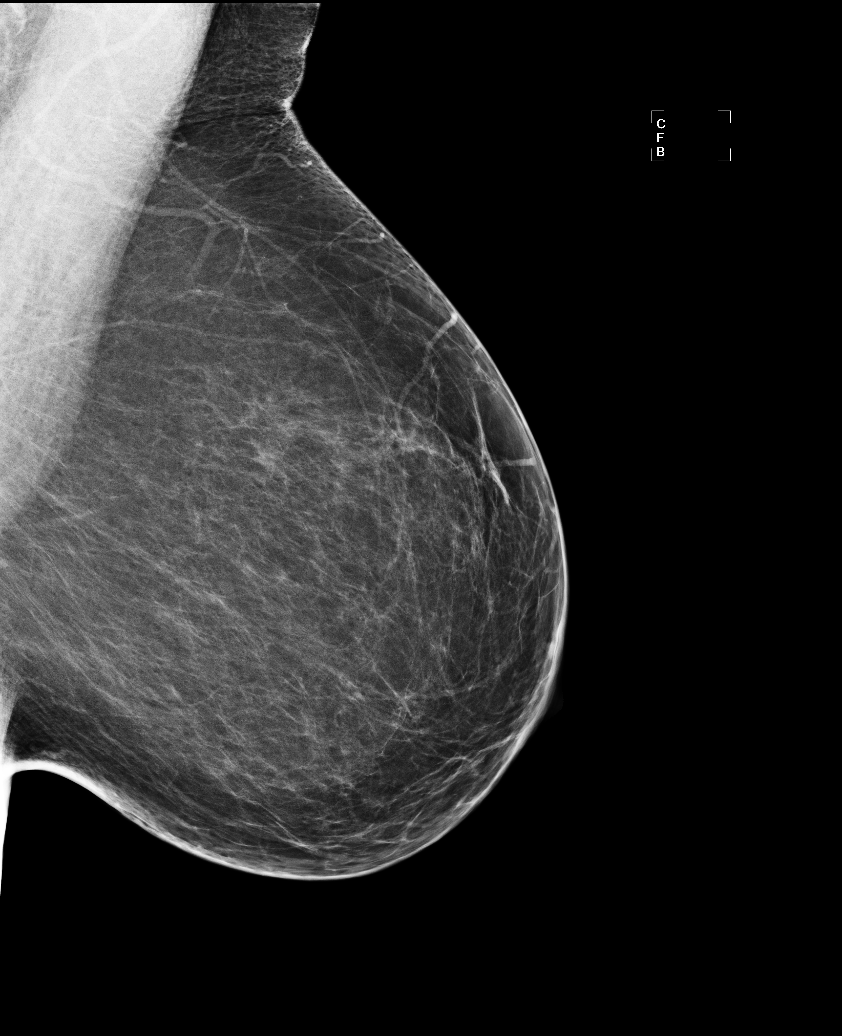

[R MLO]
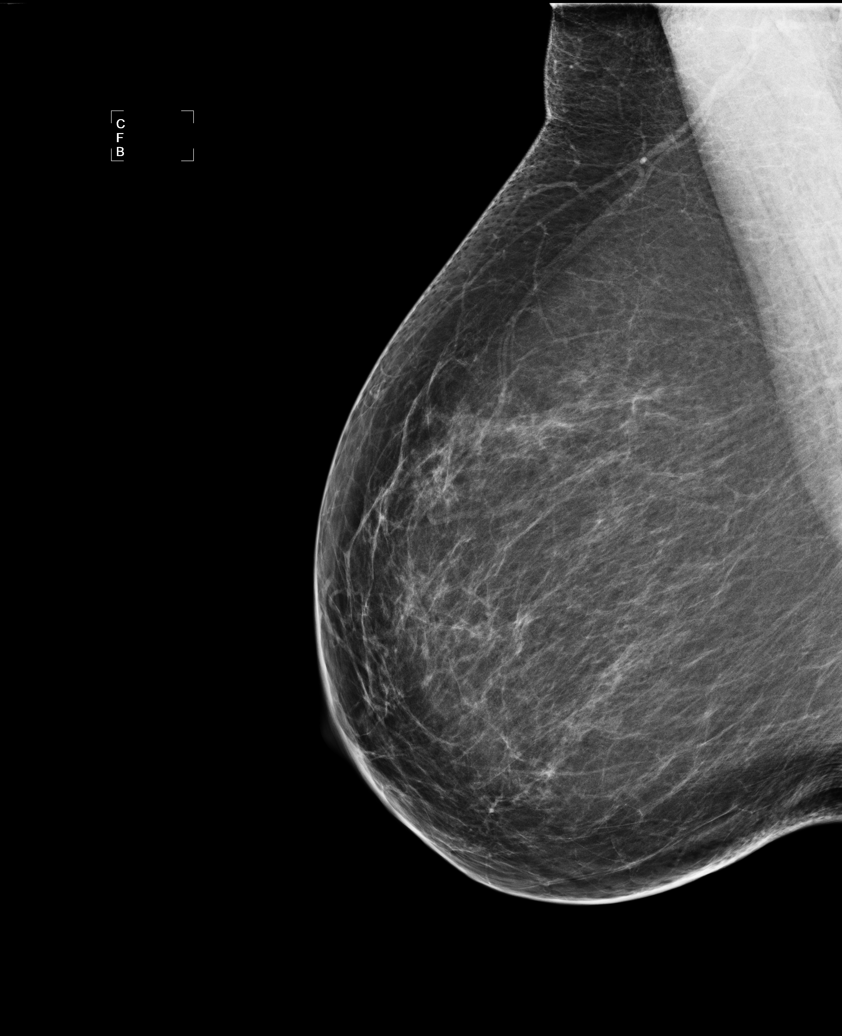

[L MLO (2 of 2)]
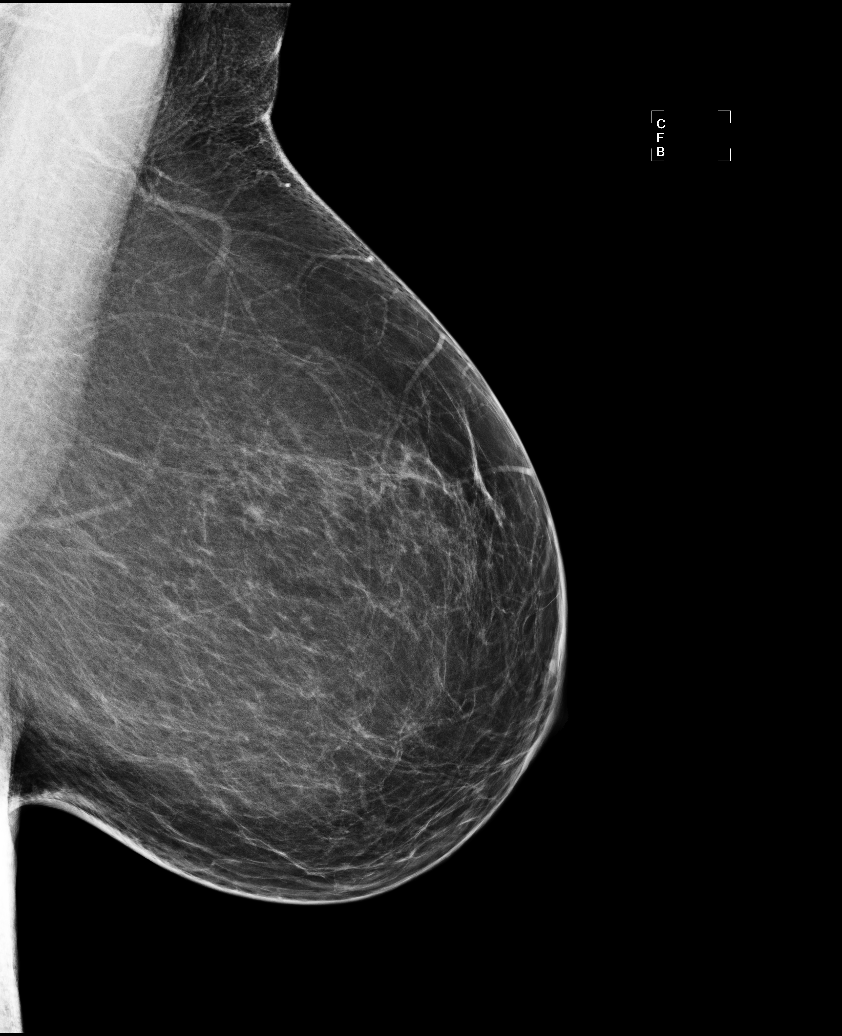

[5 of 5 positions shown; findings below may reference images not displayed]

FINDINGS: ACR Breast Density Category 1: The breast tissue is almost entirely
fatty.

There is no suspicious dominant mass, architectural distortion, or
calcification to suggest malignancy.

Images were processed with CAD.
IMPRESSION: No mammographic evidence of malignancy.

A result letter of this screening mammogram will be mailed directly
to the patient.

RECOMMENDATION:
Screening mammogram in one year. (Code:[5Y])

BI-RADS CATEGORY 1:  Negative.

## 2012-12-24 ENCOUNTER — Ambulatory Visit
Admission: RE | Admit: 2012-12-24 | Discharge: 2012-12-24 | Disposition: A | Discharge status: Home / Self Care | Payer: BC Managed Care – PPO | Source: Ambulatory Visit | Attending: Internal Medicine | Admitting: Internal Medicine

## 2012-12-24 DIAGNOSIS — M8580 Other specified disorders of bone density and structure, unspecified site: Secondary | ICD-10-CM

## 2013-01-14 ENCOUNTER — Other Ambulatory Visit: Payer: BC Managed Care – PPO | Admitting: Internal Medicine

## 2013-01-14 DIAGNOSIS — I10 Essential (primary) hypertension: Secondary | ICD-10-CM

## 2013-01-14 DIAGNOSIS — E119 Type 2 diabetes mellitus without complications: Secondary | ICD-10-CM

## 2013-01-14 DIAGNOSIS — E785 Hyperlipidemia, unspecified: Secondary | ICD-10-CM

## 2013-01-14 LAB — CBC WITH DIFFERENTIAL/PLATELET
Basophils Absolute: 0 10*3/uL (ref 0.0–0.1)
Basophils Relative: 0 % (ref 0–1)
Eosinophils Absolute: 0.3 10*3/uL (ref 0.0–0.7)
Eosinophils Relative: 4 % (ref 0–5)
HCT: 44.2 % (ref 36.0–46.0)
Hemoglobin: 15.2 g/dL — ABNORMAL HIGH (ref 12.0–15.0)
Lymphocytes Relative: 29 % (ref 12–46)
Lymphs Abs: 2.2 10*3/uL (ref 0.7–4.0)
MCH: 31.6 pg (ref 26.0–34.0)
MCHC: 34.4 g/dL (ref 30.0–36.0)
MCV: 91.9 fL (ref 78.0–100.0)
Monocytes Absolute: 0.7 10*3/uL (ref 0.1–1.0)
Monocytes Relative: 9 % (ref 3–12)
Neutro Abs: 4.4 10*3/uL (ref 1.7–7.7)
Neutrophils Relative %: 58 % (ref 43–77)
Platelets: 306 10*3/uL (ref 150–400)
RBC: 4.81 MIL/uL (ref 3.87–5.11)
RDW: 14.5 % (ref 11.5–15.5)
WBC: 7.6 10*3/uL (ref 4.0–10.5)

## 2013-01-14 LAB — HEMOGLOBIN A1C
Hgb A1c MFr Bld: 6.5 % — ABNORMAL HIGH (ref <5.7)
Mean Plasma Glucose: 140 mg/dL — ABNORMAL HIGH (ref <117)

## 2013-01-14 LAB — LIPID PANEL
Cholesterol: 203 mg/dL — ABNORMAL HIGH (ref 0–200)
HDL: 33 mg/dL — ABNORMAL LOW (ref >39)
Total CHOL/HDL Ratio: 6.2 Ratio
Triglycerides: 482 mg/dL — ABNORMAL HIGH (ref <150)

## 2013-01-14 LAB — TSH: TSH: 0.97 u[IU]/mL (ref 0.350–4.500)

## 2013-01-14 LAB — COMPREHENSIVE METABOLIC PANEL
ALT: 25 U/L (ref 0–35)
AST: 26 U/L (ref 0–37)
Albumin: 3.9 g/dL (ref 3.5–5.2)
Alkaline Phosphatase: 87 U/L (ref 39–117)
BUN: 35 mg/dL — ABNORMAL HIGH (ref 6–23)
CO2: 28 mEq/L (ref 19–32)
Calcium: 10.1 mg/dL (ref 8.4–10.5)
Chloride: 106 mEq/L (ref 96–112)
Creat: 1.99 mg/dL — ABNORMAL HIGH (ref 0.50–1.10)
Glucose, Bld: 92 mg/dL (ref 70–99)
Potassium: 5.3 mEq/L (ref 3.5–5.3)
Sodium: 144 mEq/L (ref 135–145)
Total Bilirubin: 0.5 mg/dL (ref 0.3–1.2)
Total Protein: 6.5 g/dL (ref 6.0–8.3)

## 2013-01-15 ENCOUNTER — Ambulatory Visit (INDEPENDENT_AMBULATORY_CARE_PROVIDER_SITE_OTHER): Payer: BC Managed Care – PPO | Admitting: Internal Medicine

## 2013-01-15 ENCOUNTER — Encounter: Payer: Self-pay | Admitting: Internal Medicine

## 2013-01-15 VITALS — BP 136/90 | HR 100 | Temp 98.5°F | Ht 64.0 in | Wt 226.0 lb

## 2013-01-15 DIAGNOSIS — K219 Gastro-esophageal reflux disease without esophagitis: Secondary | ICD-10-CM

## 2013-01-15 DIAGNOSIS — E119 Type 2 diabetes mellitus without complications: Secondary | ICD-10-CM

## 2013-01-15 DIAGNOSIS — E8881 Metabolic syndrome: Secondary | ICD-10-CM

## 2013-01-15 DIAGNOSIS — E669 Obesity, unspecified: Secondary | ICD-10-CM

## 2013-01-15 DIAGNOSIS — I10 Essential (primary) hypertension: Secondary | ICD-10-CM

## 2013-01-15 DIAGNOSIS — N189 Chronic kidney disease, unspecified: Secondary | ICD-10-CM

## 2013-01-15 DIAGNOSIS — E785 Hyperlipidemia, unspecified: Secondary | ICD-10-CM

## 2013-01-15 LAB — POCT URINALYSIS DIPSTICK
Bilirubin, UA: NEGATIVE
Blood, UA: NEGATIVE
Glucose, UA: NEGATIVE
Ketones, UA: NEGATIVE
Leukocytes, UA: NEGATIVE
Nitrite, UA: NEGATIVE
Spec Grav, UA: 1.015
Urobilinogen, UA: NEGATIVE
pH, UA: 7.5

## 2013-01-15 LAB — VITAMIN D 25 HYDROXY (VIT D DEFICIENCY, FRACTURES): Vit D, 25-Hydroxy: 45 ng/mL (ref 30–89)

## 2013-01-16 LAB — MICROALBUMIN, URINE: Microalb, Ur: 7.2 mg/dL — ABNORMAL HIGH (ref 0.00–1.89)

## 2013-03-10 ENCOUNTER — Ambulatory Visit (INDEPENDENT_AMBULATORY_CARE_PROVIDER_SITE_OTHER): Payer: BC Managed Care – PPO | Admitting: "Endocrinology

## 2013-03-10 ENCOUNTER — Encounter: Payer: Self-pay | Admitting: "Endocrinology

## 2013-03-10 VITALS — BP 134/86 | HR 91 | Wt 220.0 lb

## 2013-03-10 DIAGNOSIS — R1013 Epigastric pain: Secondary | ICD-10-CM

## 2013-03-10 DIAGNOSIS — K3189 Other diseases of stomach and duodenum: Secondary | ICD-10-CM

## 2013-03-10 DIAGNOSIS — G909 Disorder of the autonomic nervous system, unspecified: Secondary | ICD-10-CM

## 2013-03-10 DIAGNOSIS — IMO0001 Reserved for inherently not codable concepts without codable children: Secondary | ICD-10-CM

## 2013-03-10 DIAGNOSIS — E1169 Type 2 diabetes mellitus with other specified complication: Secondary | ICD-10-CM

## 2013-03-10 DIAGNOSIS — E782 Mixed hyperlipidemia: Secondary | ICD-10-CM

## 2013-03-10 DIAGNOSIS — E1149 Type 2 diabetes mellitus with other diabetic neurological complication: Secondary | ICD-10-CM

## 2013-03-10 DIAGNOSIS — E1143 Type 2 diabetes mellitus with diabetic autonomic (poly)neuropathy: Secondary | ICD-10-CM

## 2013-03-10 DIAGNOSIS — E1142 Type 2 diabetes mellitus with diabetic polyneuropathy: Secondary | ICD-10-CM

## 2013-03-10 DIAGNOSIS — E11649 Type 2 diabetes mellitus with hypoglycemia without coma: Secondary | ICD-10-CM | POA: Insufficient documentation

## 2013-03-10 DIAGNOSIS — E049 Nontoxic goiter, unspecified: Secondary | ICD-10-CM

## 2013-03-10 DIAGNOSIS — E063 Autoimmune thyroiditis: Secondary | ICD-10-CM

## 2013-03-10 DIAGNOSIS — K219 Gastro-esophageal reflux disease without esophagitis: Secondary | ICD-10-CM

## 2013-03-10 LAB — POCT GLYCOSYLATED HEMOGLOBIN (HGB A1C): Hemoglobin A1C: 6.1

## 2013-03-10 LAB — GLUCOSE, POCT (MANUAL RESULT ENTRY): POC Glucose: 95 mg/dl (ref 70–99)

## 2013-03-10 MED ORDER — GLUCOSE BLOOD VI STRP: ORAL_STRIP | Status: DC

## 2013-03-10 MED ORDER — RANITIDINE HCL 150 MG PO TABS: 150.0000 mg | ORAL_TABLET | Freq: Two times a day (BID) | ORAL | Status: DC

## 2013-03-10 NOTE — Patient Instructions (Signed)
Follow up visit in 4 months.  

## 2013-03-10 NOTE — Progress Notes (Addendum)
Subjective:  Patient Name: Danna Palomarez Date of Birth: January 10, 1951  MRN: [id number]  Melitta Ferritto  presents to the office today for follow-up of her type 2 diabetes, obesity, goiter, autonomic neuropathy, tachycardia, peripheral neuropathy, edema, dyspepsia, chronic renal insufficiency, tinea pedis, and combined hyperlipidemia.  HISTORY OF PRESENT ILLNESS:   Mahliyah is a 62 y.o. Caucasian woman. Nneka was unaccompanied.  1. The patient was first referred to me on 12/19/05 by her primary care provider, Dr. Emeline General, for evaluation and co-management of type 2 diabetes and obesity.  2. During the past 7 years we've tried her on several different medication regimens. When she developed chronic renal insufficiency in 2009, all metformin-containing medications were discontinued. In April 2010 I started her on Victoza at a dose of 0.6 mg/day. As of her clinic visit on 05/13/12 she had increased the Victoza dose to the 0.6 mg setting plus 4 additional clicks. 3. The patient's last [facility] visit was on 11/10/12. In the interim she has been healthy. Dr. Lorrene Reid took her off sodium bicarbonate on 02/21/13. Her reflux, dyspepsia, and nausea have worsened since then. Zantac 75, prn, is not working. She remains on Victoza, 0.6 mg plus 4 clicks, atorvastatin, atenolol, and vitamin D. She is no longer taking fish oil or Lovaza.  4. Pertinent Review of Systems:  Constitutional: The patient feels "good, except for her stomach".  Eyes: Vision is good as long as she wears her glasses. Her last dilated eye examination was in May 2013. Her next scheduled FU exam is on 05/02/13. There were no signs of diabetic retinopathy. There are no significant eye complaints. Neck: The patient has some sensation of thyroid bed pressure when she flexes her neck during yoga. She no complaints of anterior neck swelling, soreness, tenderness,  discomfort, or difficulty swallowing. She has had some hair loss.  Heart: Heart rate  increases with exercise or other physical activity. The patient has no complaints of palpitations, irregular heat beats, chest pain, or chest pressure. She will have a scheduled stress test at [facility] next week.   Gastrointestinal: As above. Bowel movents seem normal. The patient has no complaints of excessive hunger, stomach aches or pains, diarrhea, or constipation. Legs: Muscle mass and strength seem normal. There are no complaints of numbness, tingling, burning, or pain. She still has some leg edema at times.  Feet: She has not had any further gout attacks. She is able to walk without difficulties. There are no other obvious foot problems. There are no complaints of numbness, tingling, burning, or pain. No edema is noted. Hands: She feels that her hands are a little bit more arthritic, in that the joints are stiffer at times. Her AM hand numbness has improved.   Hypoglycemia: None GU: She saw Dr. Lorrene Reid in late 2013.  Her serum creatinine on 01/14/13 was 1.99. She remains on the inactive transplant list. 4. BG printout: BG meter could not be downloaded, so I reviewed them manually. She says that she has not been checking BGs very often. Her BGs in 2014 vary from 88-154. Most BGs range from 100-130.      PAST MEDICAL, FAMILY, AND SOCIAL HISTORY:  Past Medical History  Diagnosis Date  . Hypercholesteremia   . Allergic rhinitis   . GERD (gastroesophageal reflux disease)   . Type II or unspecified type diabetes mellitus without mention of complication, not stated as uncontrolled   . Essential hypertension, benign   . Menopause   . Osteopenia   . Dependent edema   .  Carbuncle, trunk   . Carpal tunnel syndrome   . Cyclical vomiting   . Chronic kidney disease   . Diabetes mellitus type II   . Goiter   . Obesity   . Diabetic peripheral neuropathy associated with type 2 diabetes mellitus   . Diabetic autonomic neuropathy   . Tachycardia   . Combined hyperlipidemia associated with type 2  diabetes mellitus   . Dyspepsia     Family History  Problem Relation Age of Onset  . COPD Mother   . Hypertension Mother   . Thyroid disease Mother   . Heart disease Father   . Hyperlipidemia Father   . Diabetes Father   . Heart disease Sister   . Cancer Maternal Grandfather   . Diabetes Paternal Grandmother     Current outpatient prescriptions:aspirin 81 MG chewable tablet, Chew 81 mg by mouth daily.  , Disp: , Rfl: ;  atenolol (TENORMIN) 100 MG tablet, Take 100 mg by mouth daily.  , Disp: , Rfl: ;  atorvastatin (LIPITOR) 40 MG tablet, Take 1 tablet (40 mg total) by mouth daily., Disp: 30 tablet, Rfl: 5;  Cholecalciferol (VITAMIN D) 2000 UNITS CAPS, Take by mouth.  , Disp: , Rfl:  Liraglutide (VICTOZA Hardin), Inject 0.6 mg into the skin daily.  , Disp: , Rfl: ;  Multiple Vitamins-Minerals (MULTIVITAMIN WITH MINERALS) tablet, Take 1 tablet by mouth daily.  , Disp: , Rfl: ;  omeprazole (PRILOSEC) 20 MG capsule, Take 20 mg by mouth daily.  , Disp: , Rfl: ;  ONE TOUCH ULTRA TEST test strip, , Disp: , Rfl: ;  omega-3 acid ethyl esters (LOVAZA) 1 G capsule, Take 1 g by mouth daily.  , Disp: , Rfl:  sodium bicarbonate 650 MG tablet, Take 650 mg by mouth 2 (two) times daily. 3 in am 3 in pm, Disp: , Rfl:  Current facility-administered medications:methylPREDNISolone acetate (DEPO-MEDROL) injection 80 mg, 80 mg, Intramuscular, Once, Elby Showers, MD  Allergies as of 03/10/2013 - Review Complete 03/10/2013  Allergen Reaction Noted  . Fenofibrate  06/03/2011  . Orange fruit (citrus)  05/13/2012  . Ramipril Cough 06/03/2011  . Sulfa antibiotics  04/30/2011    1. Work and Family: The patient is still working 40 hours or more per week. She is thinking about retirement in 2014, probably in August. 2. Activities: She is doing some yoga weekly. She still goes to a [employer] twice a week.  3. Smoking, alcohol, or drugs: None 4. Primary Care Provider: Elby Showers, MD 5. Nephrology: Dr. Jamal Maes 6. Urology: Dr. Raynelle Bring  REVIEW OF SYSTEMS: There are no other significant problems involving her other body systems.   Objective:  Vital Signs:  BP 134/86  Pulse 91  Wt 220 lb (99.791 kg)  BMI 37.74 kg/m2   Ht Readings from Last 3 Encounters:  01/15/13 5\' 4"  (1.626 m)  06/30/12 5' 3.75" (1.619 m)  01/07/12 5' 3.75" (1.619 m)   Wt Readings from Last 3 Encounters:  03/10/13 220 lb (99.791 kg)  01/15/13 226 lb (102.513 kg)  12/10/12 226 lb (102.513 kg)   There is no height on file to calculate BSA.  PHYSICAL EXAM:  Constitutional: The patient appears healthy, but obese. She is alert and bright. Her weight has decreased by 1.5 pounds since her last visit with me. She really looks great.  Face: The face appears normal.  Eyes: There is no obvious arcus or proptosis. Moisture appears normal. Mouth: The oropharynx and tongue appear  normal. Oral moisture is normal. Neck: The neck appears to be visibly normal. No carotid bruits are noted. The thyroid gland is low-lying. Flexion of her neck creates a pressure sensation in the thyroid bed. The thyroid gland is about 20 grams in size. Both lobes are within normal limits again today. The consistency of the lobes is also normal again today. The thyroid gland is not tender to palpation. Lungs: The lungs are clear to auscultation. Air movement is good. Heart: Heart rate and rhythm are regular. Heart sounds S1 and S2 are normal. I did not appreciate any pathologic cardiac murmurs. Abdomen: The abdomen is enlarged in size. Bowel sounds are normal. There is no obvious hepatomegaly, splenomegaly, or other mass effect. There was no tenderness to deep palpation.  Arms: Muscle size and bulk are normal for age.  Hands: There is no obvious tremor. Phalangeal and metacarpophalangeal joints are normal. Palmar muscles are normal. Palmar skin is normal. Palmar moisture is also normal. Legs: Muscles appear normal for age. She has trace  edema. Feet: Feet are normally formed. Dorsalis pedal pulses are normal 2+ bilaterally. Her tinea pedis is worse today. She has 2+ calluses. Neurologic: Strength is normal for age in both the upper and lower extremities. Muscle tone is normal. Sensation to touch is normal in both legs, but is slightly decreased in the left heel.    LAB DATA:Labs: HbA1c is 6.1%, compared with 5.8% at last visit and with 5.4% at the visit prior. Labs 01/15/13: Urine microalbumin 7.20 (0-1.89), hemoglobin 15.2, hematocrit 44.2; BUN 35, creatinine 1.99; cholesterol 201, triglycerides 482, HDL 33; TSH 0.970; 25-hydroxy vitamin D 45; HbA1c 6.5% Labs: 06/30/12: Cholesterol 235, triglycerides 391, HDL 32, LDL 121 Labs: 01/06/12: Cholesterol 218, triglycerides 351, HDL 34, and LDL 114 (increased from 99).  TSH 1.608, free T4 1.20, potassium 5.8, creatinine 2.66, vitamin D 46   Assessment and Plan:   ASSESSMENT:  1. Type 2 diabetes mellitus: Patient's HbA1c is again slightly higher, c/w either taking in more starch and sugar at times, or with decreasing insulin production over time, or with mix of both. She continues to tolerate Victoza quite well.  2. Hypoglycemia: None 3. Peripheral neuropathy: She has minimal signs, but no symptom of  peripheral neuropathy today. 4. Autonomic neuropathy with tachycardia: With improvement in blood glucose control over time, the neuropathy and tachycardia have improved. The longer we can keep her hemoglobin A1c in a good range, the better her neuropathy and tachycardia will be. 5. Hyperlipidemia: The patient is under treatment with Lipitor. Her cholesterol is lower, but her triglycerides are higher. She needs tighter diet, more exercise, and tighter BG control. It would be beneficial for the patient to lose more weight. 6. Goiter: Her thyroid gland is again within normal limits for size today. The patient was euthyroid in January. The previous waxing and waning of her thyroid gland size  suggest that the patient has intermittent Hashimoto's disease activity.  7. Chronic renal insufficiency: Her renal insufficiency appears to have improved. . 8. Hypertension: Dr. Lorrene Reid is managing her hypertension. Exercise and weight loss would be beneficial for this problem as well. 9. Dyspepsia/GERD: She is doing worse since stopping sodium bicarbonate. She will probably benefit from ranitidine, 150 mg, twice daily.  10. Gout: She has not had any recurrences since last visit.   11. Obesity: Her weight is slightly lower.   PLAN: 1. Diagnostic: Repeat TFT and urinary microalbumin/creatinine ratio. 2. Therapeutic: I again asked the patient to try to walk  an hour per day. If she can reduce her weight, we will not need to increase her meds for hyperlipidemia and hypertension. I prescribed ranitidine, 150 mg, twice daily. 3. Patient education: We again discussed the importance of eating right and exercising right for control of her blood pressure, her weight, and her diabetes. 4. Follow-up: 4 months  Level of Service: This visit lasted in excess of 60 minutes. More than 50% of the visit was devoted to counseling.  Sherrlyn Hock

## 2013-03-15 DIAGNOSIS — Z01818 Encounter for other preprocedural examination: Secondary | ICD-10-CM | POA: Insufficient documentation

## 2013-04-20 ENCOUNTER — Ambulatory Visit (INDEPENDENT_AMBULATORY_CARE_PROVIDER_SITE_OTHER): Payer: BC Managed Care – PPO | Admitting: Internal Medicine

## 2013-04-20 ENCOUNTER — Encounter: Payer: Self-pay | Admitting: Internal Medicine

## 2013-04-20 VITALS — BP 128/78 | HR 105 | Temp 99.8°F | Ht 64.0 in | Wt 220.0 lb

## 2013-04-20 DIAGNOSIS — J019 Acute sinusitis, unspecified: Secondary | ICD-10-CM

## 2013-04-20 MED ORDER — AZITHROMYCIN 250 MG PO TABS: ORAL_TABLET | ORAL | Status: DC

## 2013-04-20 NOTE — Progress Notes (Signed)
  Subjective:    Patient ID: [name], female    DOB: 03-27-1951, 62 y.o.   MRN: [id number]  HPI  One and a half week history of URI symptoms with nasal congestion and maxillary sinus tenderness bilaterally. Recently began to run low grade fever. No discolored nasal drainage just stuffiness. Some ear congestion. No significant cough.    Review of Systems     Objective:   Physical Exam  Both TMs full but not red. Left TM fuller than right TM.  Pharynx clear. Neck is supple without adenopathy. Chest is clear.        Assessment & Plan:  Bilateral serous otitis media Sinusitis Plan: Zithromax Z Pak 2 tabs  po day one then one tab po days 2-5 with one refill e-scribed to [employer]

## 2013-04-20 NOTE — Patient Instructions (Addendum)
Take Zithromax Z pak as directed.

## 2013-04-21 ENCOUNTER — Encounter: Payer: Self-pay | Admitting: Gastroenterology

## 2013-05-06 ENCOUNTER — Telehealth: Payer: Self-pay | Admitting: Internal Medicine

## 2013-05-06 NOTE — Telephone Encounter (Signed)
Have patient see orthopedist. If she does not have one, she may call [facility] orthopedics [phone]

## 2013-05-06 NOTE — Telephone Encounter (Signed)
Patient called stating that she twisted her right ankle a week ago and it is still swelling and a tender spot on the top side part of her foot. Patient would like to know if she should come in to be seen. Please advise.

## 2013-05-06 NOTE — Telephone Encounter (Signed)
Patient informed. 

## 2013-06-28 ENCOUNTER — Other Ambulatory Visit: Payer: Self-pay

## 2013-06-28 MED ORDER — ATORVASTATIN CALCIUM 40 MG PO TABS: 40.0000 mg | ORAL_TABLET | Freq: Every day | ORAL | Status: DC

## 2013-07-04 ENCOUNTER — Encounter: Payer: Self-pay | Admitting: Internal Medicine

## 2013-07-04 NOTE — Patient Instructions (Addendum)
Please try to diet exercise and lose weight. Return in 6 months.

## 2013-07-04 NOTE — Progress Notes (Signed)
Subjective:    Patient ID: [name], female    DOB: Mar 10, 1951, 62 y.o.   MRN: [id number]  HPI 62 year old white female with history of type 2 diabetes mellitus, hypertension, hyperlipidemia, chronic kidney disease stage IV followed by Dr. Lorrene Reid for health maintenance and evaluation of medical problems.  In April 22, 2006 patient had right-sided flank pain and was thought to have nephrolithiasis. 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 Creatinine was mildly elevated at 1.5. However, in January 2008 she was admitted with dehydration and renal insufficiency. Creatinine was 1.6 but improved with hydration to 1.39. She was on antibiotics for wound infection and had nausea and vomiting.  In July 2009 she was admitted with acute renal failure with creatinine up to 6.0. She had a left ureteral stent placed 06/23/2008 following a renogram showing poor function and poor clearance of the tracer from the left kidney as well as increase in retention in the right kidney suggesting partial obstruction. Retrograde pyelogram showed no evidence of obstruction. It was never exactly clear what caused the acute renal failure. It was thought to be perhaps an acute on chronic injury. There was possible contribution from obstruction, taking ARB and metformin.  Patient has history of significant hypertriglyceridemia. Unable to tolerate TriCor because she apparently developed a fever as a side effect. This was discontinued in the summer of 2012. Now trying to manage hyperlipidemia with Lipitor and fish oil. History of large right lower abdominal hernia repair by Dr. Theda Sers at [facility] in 2009-04-22. Subsequently underwent an incisional ventral hernia repair with mesh December 2011 by Dr. Dossie Der at the. Baseline creatinine was 2.24 at that point. She has been on the kidney transplant list but has been removed because of  recent improvement.  Patient had ganglion cyst removed from right wrist 1959, [place], D&C 1988.  Patient is allergic to sulfa and TriCor. Says Altace caused a cough.  Social history: She is married one adult daughter. Does not smoke. Occasional wine consumption.  Family history: Father with history of aortic valve replacement hyperlipidemia rheumatic heart disease. Mother with history of hypertension hypothyroidism hyperlipidemia and COPD. One sister died in 04/22/04 with coronary artery disease and hyperlipidemia. One sister living with hyperlipidemia.  Pneumovax immunization in may 2007, tetanus vaccine February 2005. Gets annual influenza immunization.  Additional medical problems include GE reflux, allergic rhinitis, dependent edema, osteopenia, history of carpal tunnel syndrome in 2005/04/22.    Review of Systems  Constitutional: Positive for fatigue.  HENT:       Fairly frequent respiratory infections  Eyes: Negative.   Respiratory: Negative.   Cardiovascular: Negative.   Gastrointestinal: Negative.   Endocrine: Negative.   Genitourinary:       Per history of present illness  Allergic/Immunologic: Positive for environmental allergies.  Neurological: Negative.   Hematological: Negative.   Psychiatric/Behavioral: Negative.        Objective:   Physical Exam  Vitals reviewed. Constitutional: She is oriented to person, place, and time. She appears well-developed and well-nourished. No distress.  HENT:  Head: Normocephalic and atraumatic.  Right Ear: External ear normal.  Left Ear: External ear normal.  Mouth/Throat: Oropharynx is clear and moist. No oropharyngeal exudate.  Eyes: Conjunctivae are normal. Pupils are equal, round, and reactive to light. Right eye exhibits no discharge. Left eye exhibits no discharge. No scleral icterus.  Neck: Neck supple. No JVD present. No thyromegaly present.  Cardiovascular: Normal rate, regular rhythm and normal heart sounds.   No  murmur heard. Pulmonary/Chest: Effort normal and breath sounds normal. No respiratory distress. She has no wheezes. She has no rales. She exhibits no tenderness.  Abdominal: Bowel sounds are normal. She exhibits no distension and no mass. There is no tenderness. There is no rebound and no guarding.  Genitourinary:  Deferred to GYN  Musculoskeletal: Normal range of motion. She exhibits no edema.  Lymphadenopathy:    She has no cervical adenopathy.  Neurological: She is alert and oriented to person, place, and time. She has normal reflexes. No cranial nerve deficit. Coordination normal.  Skin: Skin is warm and dry.  Psychiatric: She has a normal mood and affect. Her behavior is normal.          Assessment & Plan:  Chronic kidney disease stage IV  Hypertension-stable  Hyperlipidemia-unable to tolerate TriCor now on Lipitor  Type 2 diabetes mellitus-is on Victoza  Osteopenia-takes vitamin D  GE reflux-treated with Prilosec  History of allergic rhinitis  Obesity  Plan: Continue same medications and return in 6 months. Continue to be followed closely by neurology. Encouraged diet exercise and weight loss.

## 2013-07-12 ENCOUNTER — Ambulatory Visit: Payer: BC Managed Care – PPO | Admitting: "Endocrinology

## 2013-07-16 ENCOUNTER — Other Ambulatory Visit: Payer: Self-pay | Admitting: *Deleted

## 2013-07-16 DIAGNOSIS — IMO0001 Reserved for inherently not codable concepts without codable children: Secondary | ICD-10-CM

## 2013-07-16 DIAGNOSIS — E038 Other specified hypothyroidism: Secondary | ICD-10-CM

## 2013-08-02 ENCOUNTER — Other Ambulatory Visit: Payer: BC Managed Care – PPO | Admitting: Internal Medicine

## 2013-08-03 ENCOUNTER — Ambulatory Visit: Payer: BC Managed Care – PPO | Admitting: Internal Medicine

## 2013-08-04 ENCOUNTER — Ambulatory Visit: Payer: BC Managed Care – PPO | Admitting: "Endocrinology

## 2013-08-09 ENCOUNTER — Other Ambulatory Visit: Payer: Self-pay | Admitting: Internal Medicine

## 2013-08-09 ENCOUNTER — Other Ambulatory Visit: Payer: BC Managed Care – PPO | Admitting: Internal Medicine

## 2013-08-09 DIAGNOSIS — E119 Type 2 diabetes mellitus without complications: Secondary | ICD-10-CM

## 2013-08-09 DIAGNOSIS — E785 Hyperlipidemia, unspecified: Secondary | ICD-10-CM

## 2013-08-09 DIAGNOSIS — Z79899 Other long term (current) drug therapy: Secondary | ICD-10-CM

## 2013-08-09 LAB — HEPATIC FUNCTION PANEL
ALT: 22 U/L (ref 0–35)
AST: 21 U/L (ref 0–37)
Albumin: 4.1 g/dL (ref 3.5–5.2)
Alkaline Phosphatase: 100 U/L (ref 39–117)
Bilirubin, Direct: 0.1 mg/dL (ref 0.0–0.3)
Indirect Bilirubin: 0.3 mg/dL (ref 0.0–0.9)
Total Bilirubin: 0.4 mg/dL (ref 0.3–1.2)
Total Protein: 6.7 g/dL (ref 6.0–8.3)

## 2013-08-09 LAB — TSH: TSH: 1.212 u[IU]/mL (ref 0.350–4.500)

## 2013-08-09 LAB — T4, FREE: Free T4: 1.03 ng/dL (ref 0.80–1.80)

## 2013-08-09 LAB — MICROALBUMIN / CREATININE URINE RATIO
Creatinine, Urine: 124.1 mg/dL
Microalb Creat Ratio: 241.8 mg/g — ABNORMAL HIGH (ref 0.0–30.0)
Microalb, Ur: 30.01 mg/dL — ABNORMAL HIGH (ref 0.00–1.89)

## 2013-08-09 LAB — HEMOGLOBIN A1C
Hgb A1c MFr Bld: 5.9 % — ABNORMAL HIGH (ref <5.7)
Mean Plasma Glucose: 123 mg/dL — ABNORMAL HIGH (ref <117)

## 2013-08-09 LAB — LIPID PANEL
Cholesterol: 207 mg/dL — ABNORMAL HIGH (ref 0–200)
HDL: 35 mg/dL — ABNORMAL LOW (ref >39)
Total CHOL/HDL Ratio: 5.9 Ratio
Triglycerides: 462 mg/dL — ABNORMAL HIGH (ref <150)

## 2013-08-09 LAB — T3, FREE: T3, Free: 2.7 pg/mL (ref 2.3–4.2)

## 2013-08-10 ENCOUNTER — Ambulatory Visit (INDEPENDENT_AMBULATORY_CARE_PROVIDER_SITE_OTHER): Payer: BC Managed Care – PPO | Admitting: Internal Medicine

## 2013-08-10 ENCOUNTER — Encounter: Payer: Self-pay | Admitting: Internal Medicine

## 2013-08-10 VITALS — BP 130/78 | HR 72 | Temp 97.8°F | Wt 222.0 lb

## 2013-08-10 DIAGNOSIS — E781 Pure hyperglyceridemia: Secondary | ICD-10-CM

## 2013-08-10 DIAGNOSIS — E119 Type 2 diabetes mellitus without complications: Secondary | ICD-10-CM

## 2013-08-10 DIAGNOSIS — Z8739 Personal history of other diseases of the musculoskeletal system and connective tissue: Secondary | ICD-10-CM

## 2013-08-10 DIAGNOSIS — I10 Essential (primary) hypertension: Secondary | ICD-10-CM

## 2013-08-10 DIAGNOSIS — N184 Chronic kidney disease, stage 4 (severe): Secondary | ICD-10-CM

## 2013-08-10 DIAGNOSIS — Z862 Personal history of diseases of the blood and blood-forming organs and certain disorders involving the immune mechanism: Secondary | ICD-10-CM

## 2013-08-10 DIAGNOSIS — Z8639 Personal history of other endocrine, nutritional and metabolic disease: Secondary | ICD-10-CM

## 2013-08-10 DIAGNOSIS — E669 Obesity, unspecified: Secondary | ICD-10-CM

## 2013-08-10 LAB — BASIC METABOLIC PANEL
BUN: 32 mg/dL — ABNORMAL HIGH (ref 6–23)
CO2: 17 mEq/L — ABNORMAL LOW (ref 19–32)
Calcium: 9.6 mg/dL (ref 8.4–10.5)
Chloride: 111 mEq/L (ref 96–112)
Creat: 1.99 mg/dL — ABNORMAL HIGH (ref 0.50–1.10)
Glucose, Bld: 108 mg/dL — ABNORMAL HIGH (ref 70–99)
Potassium: 4.9 mEq/L (ref 3.5–5.3)
Sodium: 143 mEq/L (ref 135–145)

## 2013-08-19 ENCOUNTER — Encounter: Payer: Self-pay | Admitting: "Endocrinology

## 2013-08-19 ENCOUNTER — Other Ambulatory Visit: Payer: Self-pay | Admitting: "Endocrinology

## 2013-08-19 ENCOUNTER — Ambulatory Visit (INDEPENDENT_AMBULATORY_CARE_PROVIDER_SITE_OTHER): Payer: BC Managed Care – PPO | Admitting: "Endocrinology

## 2013-08-19 VITALS — BP 130/88 | HR 102 | Wt 221.0 lb

## 2013-08-19 DIAGNOSIS — N184 Chronic kidney disease, stage 4 (severe): Secondary | ICD-10-CM

## 2013-08-19 DIAGNOSIS — I498 Other specified cardiac arrhythmias: Secondary | ICD-10-CM

## 2013-08-19 DIAGNOSIS — E1169 Type 2 diabetes mellitus with other specified complication: Secondary | ICD-10-CM

## 2013-08-19 DIAGNOSIS — R000 Tachycardia, unspecified: Secondary | ICD-10-CM

## 2013-08-19 DIAGNOSIS — E1149 Type 2 diabetes mellitus with other diabetic neurological complication: Secondary | ICD-10-CM

## 2013-08-19 DIAGNOSIS — E1143 Type 2 diabetes mellitus with diabetic autonomic (poly)neuropathy: Secondary | ICD-10-CM

## 2013-08-19 DIAGNOSIS — G909 Disorder of the autonomic nervous system, unspecified: Secondary | ICD-10-CM

## 2013-08-19 DIAGNOSIS — E049 Nontoxic goiter, unspecified: Secondary | ICD-10-CM

## 2013-08-19 DIAGNOSIS — IMO0001 Reserved for inherently not codable concepts without codable children: Secondary | ICD-10-CM

## 2013-08-19 DIAGNOSIS — E11649 Type 2 diabetes mellitus with hypoglycemia without coma: Secondary | ICD-10-CM

## 2013-08-19 DIAGNOSIS — E1142 Type 2 diabetes mellitus with diabetic polyneuropathy: Secondary | ICD-10-CM

## 2013-08-19 DIAGNOSIS — E782 Mixed hyperlipidemia: Secondary | ICD-10-CM

## 2013-08-19 DIAGNOSIS — I4711 Inappropriate sinus tachycardia, so stated: Secondary | ICD-10-CM

## 2013-08-19 DIAGNOSIS — I10 Essential (primary) hypertension: Secondary | ICD-10-CM

## 2013-08-19 LAB — GLUCOSE, POCT (MANUAL RESULT ENTRY): POC Glucose: 146 mg/dl — AB (ref 70–99)

## 2013-08-19 LAB — POCT GLYCOSYLATED HEMOGLOBIN (HGB A1C): Hemoglobin A1C: 5.7

## 2013-08-19 MED ORDER — GLUCOSE BLOOD VI STRP: ORAL_STRIP | Status: DC

## 2013-08-19 MED ORDER — ONETOUCH ULTRASOFT LANCETS MISC: Status: DC

## 2013-08-19 NOTE — Patient Instructions (Signed)
Follow up visit in 4 months. Please Eat Right and exercise at least one hour per day.

## 2013-08-19 NOTE — Progress Notes (Signed)
Subjective:  Patient Name: [name] Date of Birth: 12-30-1951  MRN: [id number]  [name]  presents to the office today for follow-up of her type 2 diabetes, obesity, goiter, autonomic neuropathy, tachycardia, peripheral neuropathy, edema, dyspepsia, chronic renal insufficiency, tinea pedis, and combined hyperlipidemia.  HISTORY OF PRESENT ILLNESS:   [name] is a 62 y.o. Caucasian woman. Zamaya was unaccompanied.  1. The patient was first referred to me on 12/19/05 by her primary care provider, Dr. Emeline General, for evaluation and co-management of type 2 diabetes and obesity.  2. During the past 8 years we've tried her on several different medication regimens. When she developed chronic renal insufficiency in 2009, all metformin-containing medications were discontinued. In April 2010 I started her on Victoza at a dose of 0.6 mg/day. As of her clinic visit on 05/13/12 she had increased the Victoza dose to the 0.6 mg setting plus 4 additional clicks.  3. The patient's last [facility] visit was on 03/10/13. In the interim she has been healthy. Dr. Lorrene Reid put her back on sodium bicarbonate, one tab, twice daily. Her serum creatinine was 1.99 and HbA1c 5.9% at Dr. Verlene Mayer office earlier this month. .She remains on Victoza, 0.6 mg plus 4 clicks, atorvastatin, atenolol, and vitamin D.  4. Pertinent Review of Systems:  Constitutional: The patient feels "good".  Eyes: Vision is good as long as she wears her glasses. Her last dilated eye examination was in May 2013. There were no signs of diabetic retinopathy. Her next scheduled FU exam is today. There are no significant eye complaints. Neck: The patient has some sensation of thyroid bed pressure when she flexes her neck during yoga. She has no complaints of anterior neck swelling, soreness, tenderness,  discomfort, or difficulty swallowing. She has had some hair loss at the vertex.  Heart: Heart rate increases with exercise or other physical  activity. The patient has no complaints of palpitations, irregular heat beats, chest pain, or chest pressure. Her stress test at [facility] in late March was fine. There were no signs of ASHD.  Gastrointestinal: Dr. Collene Mares did her colonoscopy in June. Five polyps were seen and removed.  She occasionally has some problems with reflux, but is much improved since resuming sodium bicarb. Bowel movents seem normal. The patient has no complaints of excessive hunger, stomach aches or pains, diarrhea, or constipation. Legs: Muscle mass and strength seem normal. There are no complaints of numbness, tingling, burning, or pain. She still has some leg edema at times.  Feet: She has not had any further gout attacks. She is able to walk without difficulties. There are no other obvious foot problems. There are no complaints of numbness, tingling, burning, or pain. No edema is noted. Hands: She feels that her hands are good. She has some stiffness, but not much. Her AM hand numbness has resolved.   Hypoglycemia: None GU: She saw Dr. Lorrene Reid in May. Dr. Lorrene Reid told her to resume sodium bicarb. She remains on the inactive transplant list.  4. BG printout: She forgot her meter today. She has not been checking BGs very often recently.      PAST MEDICAL, FAMILY, AND SOCIAL HISTORY:  Past Medical History  Diagnosis Date  . Hypercholesteremia   . Allergic rhinitis   . GERD (gastroesophageal reflux disease)   . Type II or unspecified type diabetes mellitus without mention of complication, not stated as uncontrolled   . Essential hypertension, benign   . Menopause   . Osteopenia   . Dependent edema   .  Carbuncle, trunk   . Carpal tunnel syndrome   . Cyclical vomiting   . Chronic kidney disease   . Diabetes mellitus type II   . Goiter   . Obesity   . Diabetic peripheral neuropathy associated with type 2 diabetes mellitus   . Diabetic autonomic neuropathy   . Tachycardia   . Combined hyperlipidemia associated with type  2 diabetes mellitus   . Dyspepsia     Family History  Problem Relation Age of Onset  . COPD Mother   . Hypertension Mother   . Thyroid disease Mother   . Heart disease Father   . Hyperlipidemia Father   . Diabetes Father   . Heart disease Sister   . Cancer Maternal Grandfather   . Diabetes Paternal Grandmother     Current outpatient prescriptions:aspirin 81 MG chewable tablet, Chew 81 mg by mouth daily.  , Disp: , Rfl: ;  atenolol (TENORMIN) 100 MG tablet, Take 100 mg by mouth daily.  , Disp: , Rfl: ;  atorvastatin (LIPITOR) 40 MG tablet, Take 1 tablet (40 mg total) by mouth daily., Disp: 30 tablet, Rfl: 11;  Cholecalciferol (VITAMIN D) 2000 UNITS CAPS, Take by mouth.  , Disp: , Rfl:  glucose blood (ONETOUCH VERIO) test strip, Check blood sugar 10 x daily, Disp: 300 each, Rfl: 3;  Liraglutide (VICTOZA [place]), Inject 0.6 mg into the skin daily.  , Disp: , Rfl: ;  Multiple Vitamins-Minerals (MULTIVITAMIN WITH MINERALS) tablet, Take 1 tablet by mouth daily.  , Disp: , Rfl: ;  sodium bicarbonate 650 MG tablet, Take 650 mg by mouth 2 (two) times daily. 3 in am 3 in pm, Disp: , Rfl:  azithromycin (ZITHROMAX) 250 MG tablet, 2 tabs po day one then one tab po days 2-5, Disp: 6 tablet, Rfl: 1;  omega-3 acid ethyl esters (LOVAZA) 1 G capsule, Take 1 g by mouth daily.  , Disp: , Rfl: ;  omeprazole (PRILOSEC) 20 MG capsule, Take 20 mg by mouth daily.  , Disp: , Rfl: ;  ranitidine (ZANTAC) 150 MG tablet, Take 1 tablet (150 mg total) by mouth 2 (two) times daily., Disp: 60 tablet, Rfl: 6 Current facility-administered medications:methylPREDNISolone acetate (DEPO-MEDROL) injection 80 mg, 80 mg, Intramuscular, Once, Elby Showers, MD  Allergies as of 08/19/2013 - Review Complete 08/19/2013  Allergen Reaction Noted  . Fenofibrate  06/03/2011  . Orange fruit [citrus]  05/13/2012  . Ramipril Cough 06/03/2011  . Sulfa antibiotics  04/30/2011    1. Work and Family: The patient retired August 1st.  2.  Activities: She is doing some yoga 3 times per week. She is not doing any regular exercise.   3. Smoking, alcohol, or drugs: None 4. Primary Care Provider: Elby Showers, MD 5. Nephrology: Dr. Jamal Maes 6. Urology: Dr. Raynelle Bring 7. GI: Dr. Juanita Craver  REVIEW OF SYSTEMS: There are no other significant problems involving her other body systems.   Objective:  Vital Signs:  BP 130/88  Pulse 102  Wt 221 lb (100.245 kg)  BMI 37.92 kg/m2   Ht Readings from Last 3 Encounters:  04/20/13 5\' 4"  (1.626 m)  01/15/13 5\' 4"  (1.626 m)  06/30/12 5' 3.75" (1.619 m)   Wt Readings from Last 3 Encounters:  08/19/13 221 lb (100.245 kg)  08/10/13 222 lb (100.699 kg)  04/20/13 220 lb (99.791 kg)   There is no height on file to calculate BSA.  PHYSICAL EXAM:  Constitutional: The patient appears healthy, but obese. She is alert and  bright. Her weight has remained essentially the same. She looks great.  Face: The face appears normal.  Eyes: There is no obvious arcus or proptosis. Moisture appears normal. Mouth: The oropharynx and tongue appear normal. Oral moisture is normal. Neck: The neck appears to be visibly normal. No carotid bruits are noted. The thyroid gland is low-lying. Flexion of her neck creates a pressure sensation in the thyroid bed. The thyroid gland is about 20+ grams in size. The right lobe is still within normal limits for size. Her left lobe is slightly enlarged, larger today than at last visit. The consistency of the lobes is normal. The thyroid gland is not tender to palpation. Lungs: The lungs are clear to auscultation. Air movement is good. Heart: Heart rate and rhythm are regular. Heart sounds S1 and S2 are normal. I did not appreciate any pathologic cardiac murmurs. Abdomen: The abdomen is enlarged in size. Bowel sounds are normal. There is no obvious hepatomegaly, splenomegaly, or other mass effect. There was no tenderness to deep palpation.  Arms: Muscle size and bulk  are normal for age.  Hands: There is no obvious tremor. Phalangeal and metacarpophalangeal joints are normal. Palmar muscles are normal. Palmar skin is normal. Palmar moisture is also normal. Legs: Muscles appear normal for age. She has trace edema. Feet: Feet are normally formed. Dorsalis pedal pulses are normal 1+ bilaterally. Her tinea pedis is inactive. She has 2+ calluses. Neurologic: Strength is normal for age in both the upper and lower extremities. Muscle tone is normal. Sensation to touch is normal in both legs, but is slightly decreased in the heels.    LAB DATA:Labs: HbA1c is 5.7% today, compared with 6.1% at last visit, with 5.8% at the visit prior, and with 5.4% at the visit prior to that. Labs 08/09/13: TSH 1.212, free T4 1.03, free T3 2.7; microalbumin/creatinine ratio 241.8; cholesterol 201, triglycerides 462, HDL 35, LDL not calculated; CO2 17, creatinine 1.99, calcium 9.6 Labs 01/15/13: Urine microalbumin 7.20 (0-1.89), hemoglobin 15.2, hematocrit 44.2; BUN 35, creatinine 1.99; cholesterol 201, triglycerides 482, HDL 33; TSH 0.970; 25-hydroxy vitamin D 45; HbA1c 6.5% Labs: 06/30/12: Cholesterol 235, triglycerides 391, HDL 32, LDL 121 Labs: 01/06/12: Cholesterol 218, triglycerides 351, HDL 34, and LDL 114 (increased from 99).  TSH 1.608, free T4 1.20, potassium 5.8, creatinine 2.66, vitamin D 46   Assessment and Plan:   ASSESSMENT:  1. Type 2 diabetes mellitus: Patient's HbA1c is much better today than at last visit, c/w taking in less starch and sugar at times. She continues to tolerate Victoza quite well.  2. Hypoglycemia: None 3. Peripheral neuropathy: She has minimal signs, but no symptom of  peripheral neuropathy today. 4. Autonomic neuropathy with tachycardia: With improvement in blood glucose control over time, the neuropathy and tachycardia will improve, but there is a lag time of 6-9 months before we see the improvement. 5. Hyperlipidemia: The patient is under treatment  with Lipitor. Her cholesterol is lower, but her triglycerides are higher. Unfortunately, she has had adverse reactions to medications designed to reduce triglyceride levels. She needs tighter diet, more exercise, and tighter BG control. It would be beneficial for the patient to lose more weight. 6. Goiter: Her thyroid gland is mildly enlarged again today. The patient was euthyroid in January and again one week ago. The waxing and waning of her thyroid gland size suggest that the patient has intermittent Hashimoto's disease activity.  7. Chronic renal insufficiency: Her renal insufficiency has improved. . 8. Hypertension: Dr. Lorrene Reid is  managing her hypertension. Exercise and weight loss would be beneficial for this problem as well. 9. Dyspepsia/GERD: She is doing better since resuming sodium bicarbonate.  10. Gout: She has not had any recurrences since last visit.   11. Obesity: Her weight is unchanged. 12. Pedal edema: Her edema is much better. 13.  Elevated microalbumin/creatinine ratio:  I presume that her ratio is elevated because she leaks protein into her urine due to renal disease and her urinary creatinine is lower, also due to renal disease. I defer to Dr. Sanda Klein good judgement whether or not we ned to address this elevated ratio.   PLAN: 1. Diagnostic: Repeat urinary microalbumin/creatinine ratio prior to next visit.. 2. Therapeutic: I again and again have asked the patient to try to walk an hour or more per day. If she can reduce her weight, we will not need to increase her meds for hyperlipidemia and hypertension.  3. Patient education: We again discussed the importance of eating right and exercising right for control of her blood pressure, her weight, and her diabetes. 4. Follow-up: 4 months  Level of Service: This visit lasted in excess of 60 minutes. More than 50% of the visit was devoted to counseling.  Sherrlyn Hock

## 2013-08-27 ENCOUNTER — Telehealth: Payer: Self-pay | Admitting: Internal Medicine

## 2013-08-27 DIAGNOSIS — R110 Nausea: Secondary | ICD-10-CM

## 2013-08-27 MED ORDER — PROMETHAZINE HCL 25 MG PO TABS: 25.0000 mg | ORAL_TABLET | Freq: Three times a day (TID) | ORAL | Status: DC | PRN

## 2013-08-27 NOTE — Telephone Encounter (Signed)
Please refill #30 tablets with one refill

## 2013-08-30 ENCOUNTER — Encounter: Payer: Self-pay | Admitting: Internal Medicine

## 2013-08-30 NOTE — Progress Notes (Signed)
  Subjective:    Patient ID: [name], female    DOB: June 18, 1951, 62 y.o.   MRN: [id number]  HPI 62 year old white female in today for six-month recheck. History of stage IV chronic kidney disease, hypertension, hyperlipidemia, type 2 diabetes mellitus followed by Dr. Tobe Sos. She has retired. Had to have more time for diet and exercise. Kidney disease followed by Dr. Lorrene Reid. She remains overweight. She is on Victoza for diabetes. She is on Lipitor. Remains on Tenormin for hypertension. Also takes sodium bicarbonate. Unable to tolerate triglyceride lowering medication because of side effects.    Review of Systems     Objective:   Physical Exam Neck is supple without JVD or carotid bruits. Chest clear to auscultation. Cardiac exam regular rate and rhythm normal S1 and S2. Extremities without edema. Lipid panel shows fasting triglycerides of 462 and previously were 482 in January 2014. Total cholesterol is 207. Liver functions are normal. Creatinine stable at 1.99        Assessment & Plan:  Obesity  Hypertriglyceridemia despite being on Lipitor. Liver functions are normal.  Stage IV kidney disease  Adult onset diabetes mellitus treated with Victoza  Hypertension-stable  Plan: Encourage patient she is retired to get serious about diet and exercise. Return in 6 months. Hemoglobin A1c stable at 5.9%.  Spent 25 minutes assessing medical problems, examining & speaking with patient

## 2013-08-30 NOTE — Patient Instructions (Signed)
Continue same medications and return in 6 months. Try to diet exercise and lose weight now that you are retired

## 2013-09-07 ENCOUNTER — Encounter: Payer: Self-pay | Admitting: "Endocrinology

## 2013-09-08 ENCOUNTER — Ambulatory Visit: Payer: BC Managed Care – PPO | Admitting: "Endocrinology

## 2013-09-24 ENCOUNTER — Other Ambulatory Visit: Payer: Self-pay | Admitting: *Deleted

## 2013-09-24 DIAGNOSIS — IMO0001 Reserved for inherently not codable concepts without codable children: Secondary | ICD-10-CM

## 2013-09-24 MED ORDER — INSULIN PEN NEEDLE 31G X 5 MM MISC: Status: DC

## 2013-10-13 ENCOUNTER — Ambulatory Visit (INDEPENDENT_AMBULATORY_CARE_PROVIDER_SITE_OTHER): Payer: BC Managed Care – PPO | Admitting: Internal Medicine

## 2013-10-13 DIAGNOSIS — Z23 Encounter for immunization: Secondary | ICD-10-CM

## 2013-11-04 ENCOUNTER — Other Ambulatory Visit: Payer: Self-pay | Admitting: Gynecology

## 2013-12-08 ENCOUNTER — Other Ambulatory Visit: Payer: Self-pay

## 2013-12-08 DIAGNOSIS — Z1231 Encounter for screening mammogram for malignant neoplasm of breast: Secondary | ICD-10-CM

## 2013-12-20 ENCOUNTER — Ambulatory Visit: Payer: BC Managed Care – PPO | Admitting: "Endocrinology

## 2013-12-27 ENCOUNTER — Encounter: Payer: Self-pay | Admitting: Internal Medicine

## 2013-12-27 ENCOUNTER — Ambulatory Visit (INDEPENDENT_AMBULATORY_CARE_PROVIDER_SITE_OTHER): Payer: BC Managed Care – PPO | Admitting: Internal Medicine

## 2013-12-27 VITALS — BP 134/78 | Temp 100.5°F | Wt 222.0 lb

## 2013-12-27 DIAGNOSIS — J019 Acute sinusitis, unspecified: Secondary | ICD-10-CM

## 2013-12-27 DIAGNOSIS — IMO0001 Reserved for inherently not codable concepts without codable children: Secondary | ICD-10-CM

## 2013-12-27 DIAGNOSIS — B9789 Other viral agents as the cause of diseases classified elsewhere: Secondary | ICD-10-CM

## 2013-12-27 DIAGNOSIS — M791 Myalgia, unspecified site: Secondary | ICD-10-CM

## 2013-12-27 DIAGNOSIS — R509 Fever, unspecified: Secondary | ICD-10-CM

## 2013-12-27 DIAGNOSIS — H6690 Otitis media, unspecified, unspecified ear: Secondary | ICD-10-CM

## 2013-12-27 DIAGNOSIS — B349 Viral infection, unspecified: Secondary | ICD-10-CM

## 2013-12-27 DIAGNOSIS — H6693 Otitis media, unspecified, bilateral: Secondary | ICD-10-CM

## 2013-12-27 LAB — POCT RAPID STREP A (OFFICE): Rapid Strep A Screen: NEGATIVE

## 2013-12-27 LAB — INFLUENZA A AND B
Inflenza A Ag: NEGATIVE
Influenza B Ag: NEGATIVE

## 2013-12-27 NOTE — Patient Instructions (Signed)
Take Zithromax Z-PAK as directed.  If Not better in 7 days have prescription refilled. Take Tussionex sparingly for cough.

## 2013-12-27 NOTE — Progress Notes (Signed)
Patient informed. 

## 2013-12-27 NOTE — Progress Notes (Signed)
   Subjective:    Patient ID: [name], female    DOB: 06-02-1951, 62 y.o.   MRN: [id number]  HPI 62 year old female in today with with complaint of sinus symptoms onset around Thanksgiving. On  Christmas Day, she came down with fever up to 103, myalgias and chills. She did have influenza immunization earlier this year. Complaining of sinus pressure. Coughing with some discolored sputum. Still does not feel well today. Has malaise and fatigue. And    Review of Systems     Objective:   Physical Exam Skin is warm and dry. Pharynx slightly injected. Rapid strep screen is negative. TMs are full bilaterally and pink. Neck is supple. Chest clear to auscultation without rales or wheezing        Assessment & Plan:  Sinusitis  Bilateral otitis media  Viral syndrome-rapid influenza test is negative  Plan: Zithromax Z-Pak take 2 tablets day one followed by 1 tablet by mouth days 2 through 5 with 1 refill. If not better in 7 days have prescription refilled.  Tussionex 4 ounces 1 teaspoon by mouth every 12 hours when necessary cough.

## 2013-12-27 NOTE — Addendum Note (Signed)
Addended by: Brett Canales on: 12/27/2013 02:22 PM   Modules accepted: Orders

## 2014-01-10 ENCOUNTER — Ambulatory Visit
Admission: RE | Admit: 2014-01-10 | Discharge: 2014-01-10 | Disposition: A | Discharge status: Home / Self Care | Payer: BC Managed Care – PPO | Source: Ambulatory Visit

## 2014-01-10 DIAGNOSIS — Z1231 Encounter for screening mammogram for malignant neoplasm of breast: Secondary | ICD-10-CM

## 2014-01-24 ENCOUNTER — Telehealth: Payer: Self-pay | Admitting: Internal Medicine

## 2014-01-24 NOTE — Telephone Encounter (Signed)
Pt up last night with several episodes of vomiting and low grade fever. Some diarrhea. Likely has viral gastroenteritis. Declines Phenergan suppositories. Has Phenergan tablets. If cannot keep po down must go to ED because of history of CKD.

## 2014-01-31 ENCOUNTER — Telehealth: Payer: Self-pay | Admitting: Internal Medicine

## 2014-02-01 ENCOUNTER — Encounter: Payer: Self-pay | Admitting: Internal Medicine

## 2014-02-01 ENCOUNTER — Ambulatory Visit
Admission: RE | Admit: 2014-02-01 | Discharge: 2014-02-01 | Disposition: A | Discharge status: Home / Self Care | Payer: BC Managed Care – PPO | Source: Ambulatory Visit | Attending: Internal Medicine | Admitting: Internal Medicine

## 2014-02-01 ENCOUNTER — Ambulatory Visit (INDEPENDENT_AMBULATORY_CARE_PROVIDER_SITE_OTHER): Payer: BC Managed Care – PPO | Admitting: Internal Medicine

## 2014-02-01 VITALS — BP 134/84 | HR 92 | Temp 98.7°F | Wt 216.0 lb

## 2014-02-01 DIAGNOSIS — K5289 Other specified noninfective gastroenteritis and colitis: Secondary | ICD-10-CM

## 2014-02-01 DIAGNOSIS — K529 Noninfective gastroenteritis and colitis, unspecified: Secondary | ICD-10-CM

## 2014-02-01 DIAGNOSIS — M79672 Pain in left foot: Secondary | ICD-10-CM

## 2014-02-01 DIAGNOSIS — N184 Chronic kidney disease, stage 4 (severe): Secondary | ICD-10-CM

## 2014-02-01 DIAGNOSIS — E119 Type 2 diabetes mellitus without complications: Secondary | ICD-10-CM

## 2014-02-01 DIAGNOSIS — M79609 Pain in unspecified limb: Secondary | ICD-10-CM

## 2014-02-01 LAB — BASIC METABOLIC PANEL
BUN: 30 mg/dL — ABNORMAL HIGH (ref 6–23)
CO2: 30 mEq/L (ref 19–32)
Calcium: 10.1 mg/dL (ref 8.4–10.5)
Chloride: 104 mEq/L (ref 96–112)
Creat: 2.12 mg/dL — ABNORMAL HIGH (ref 0.50–1.10)
Glucose, Bld: 74 mg/dL (ref 70–99)
Potassium: 5.2 mEq/L (ref 3.5–5.3)
Sodium: 143 mEq/L (ref 135–145)

## 2014-02-01 LAB — SEDIMENTATION RATE: Sed Rate: 8 mm/hr (ref 0–22)

## 2014-02-01 LAB — URIC ACID: Uric Acid, Serum: 9.1 mg/dL — ABNORMAL HIGH (ref 2.4–7.0)

## 2014-02-01 IMAGING — CR DG FOOT COMPLETE 3+V*L*
3 series · 3 of 3 positions shown · non-contrast
Comparison: [DATE]

CLINICAL DATA: Metatarsal pain

EXAM:
LEFT FOOT - COMPLETE 3+ VIEW

[view not recorded (1 of 3)]
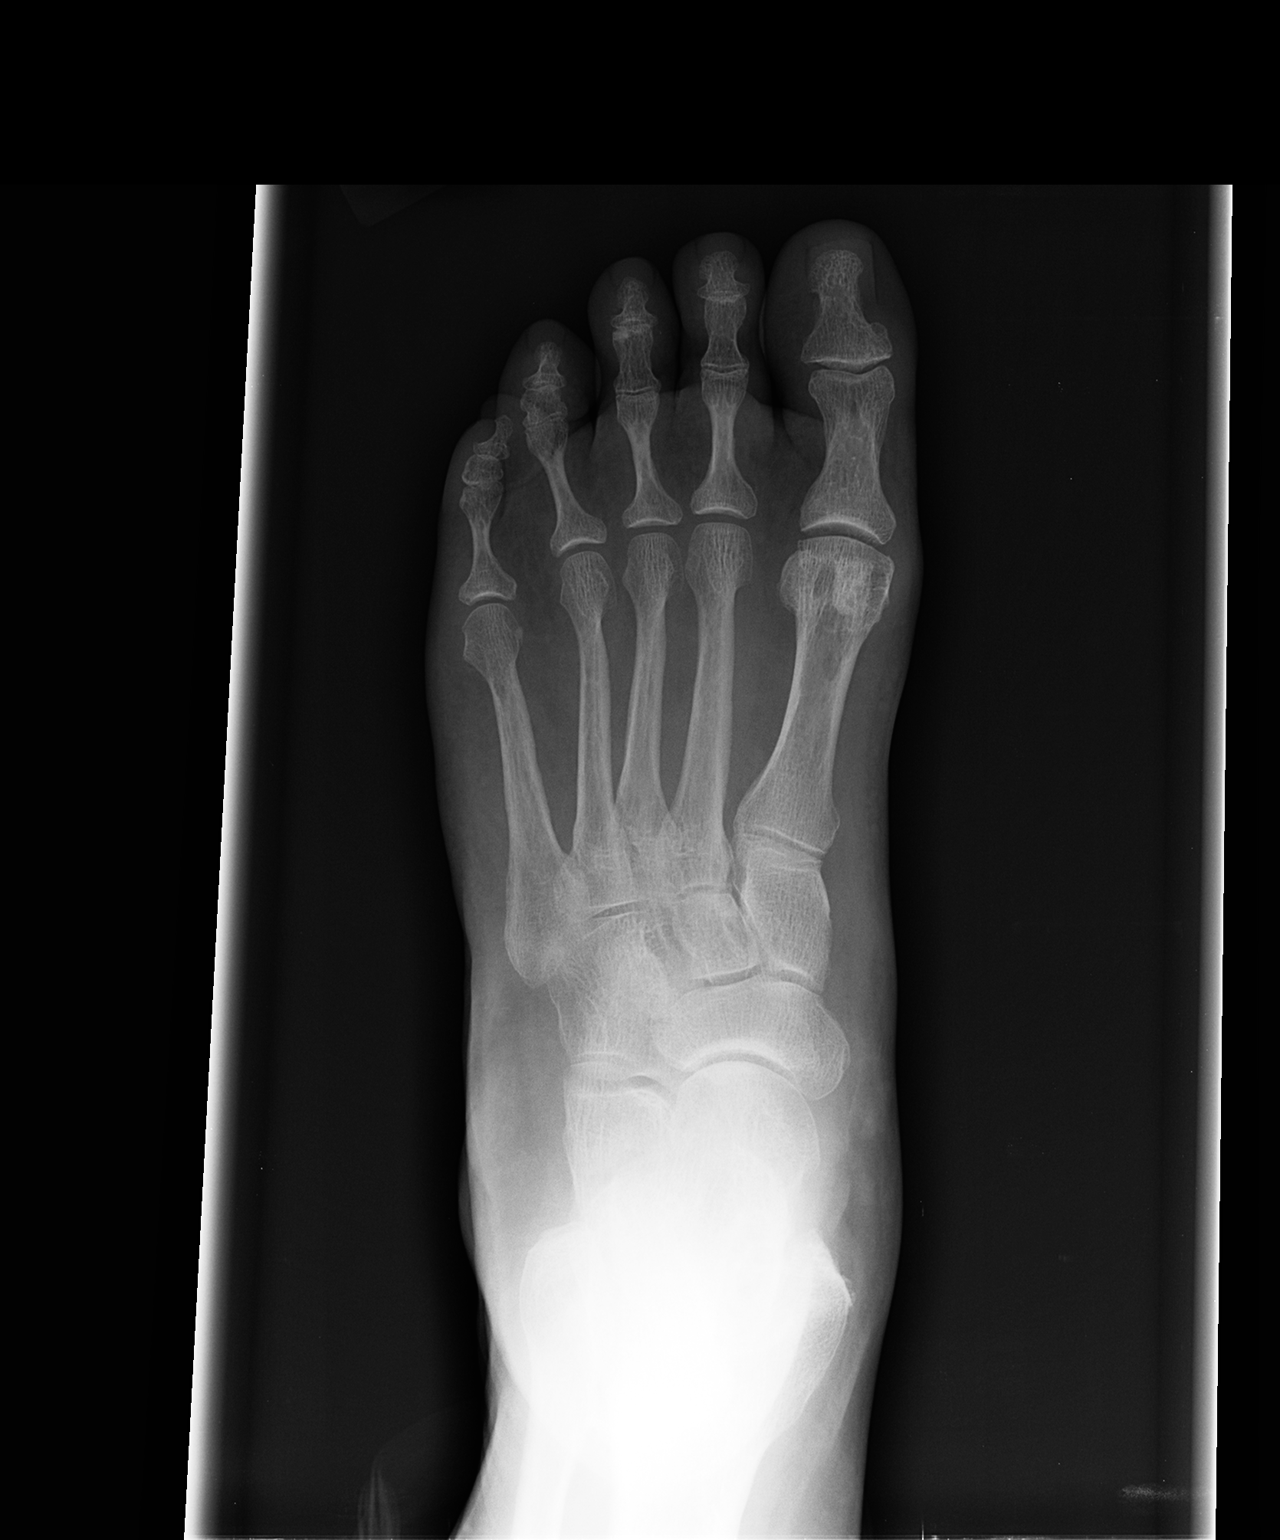

[view not recorded (2 of 3)]
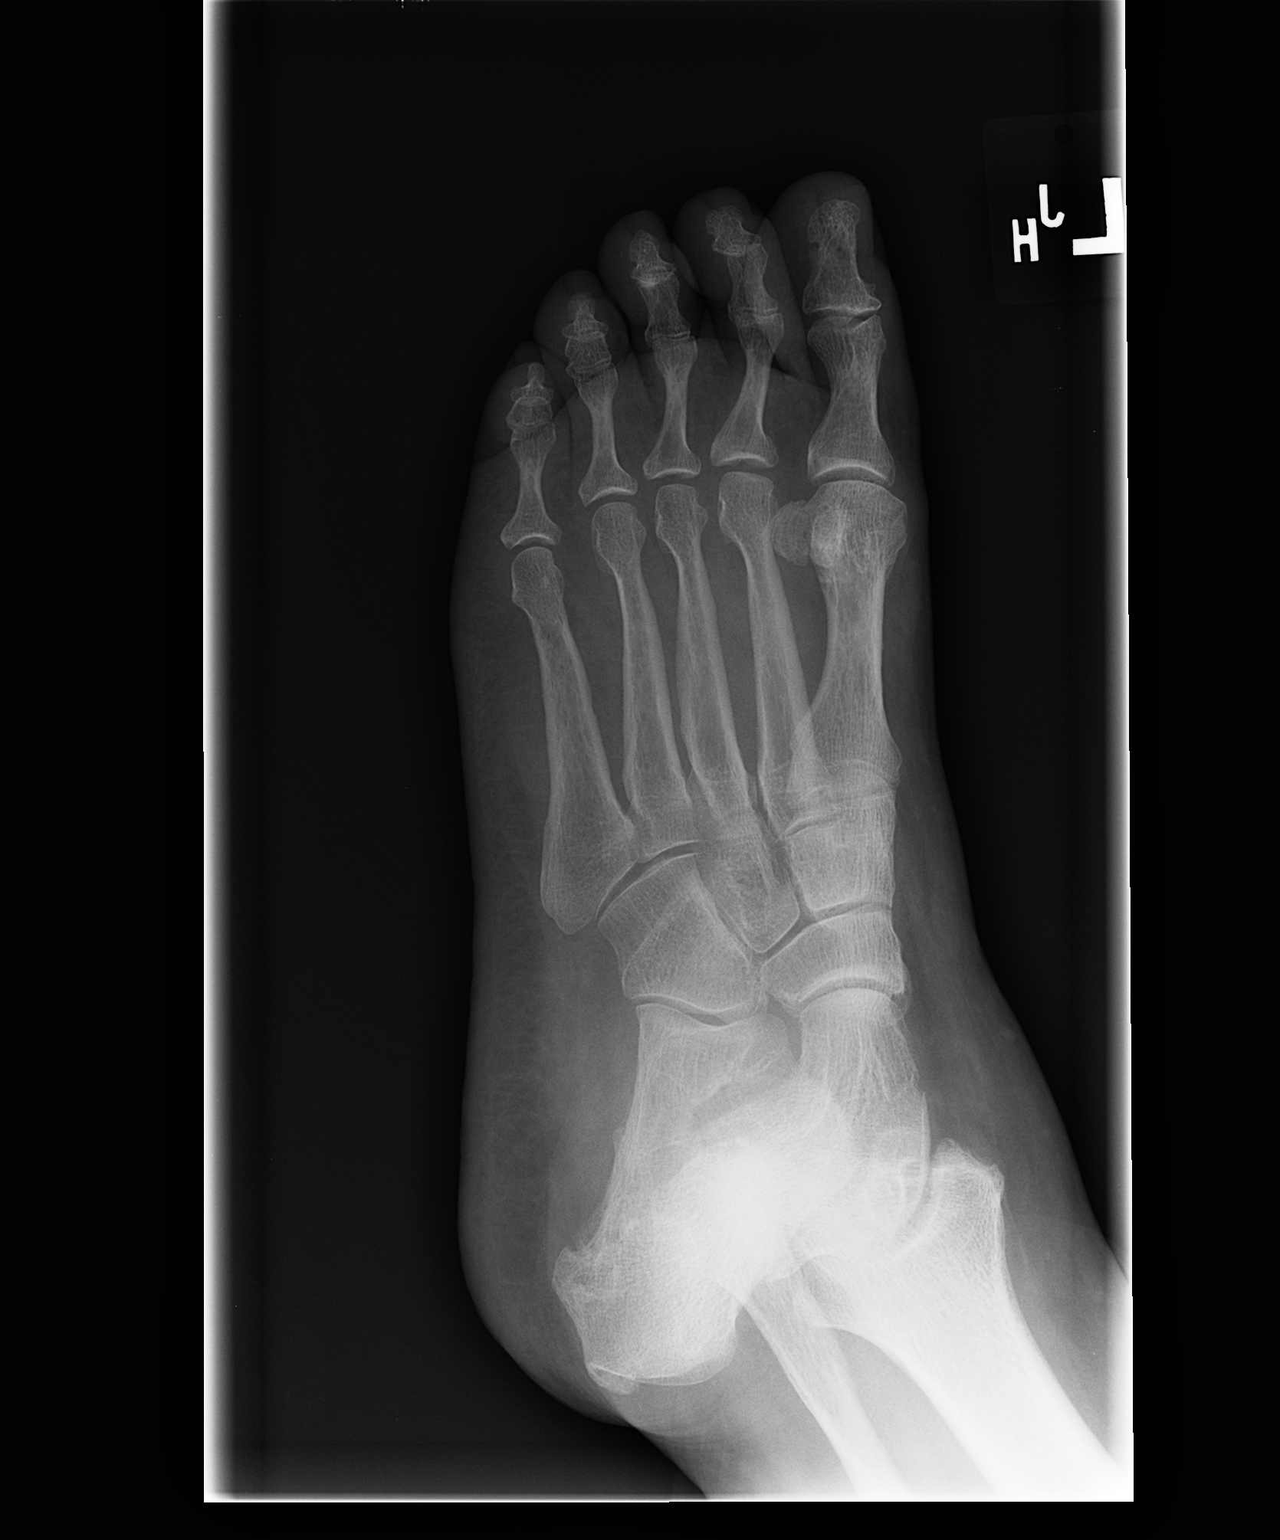

[view not recorded (3 of 3)]
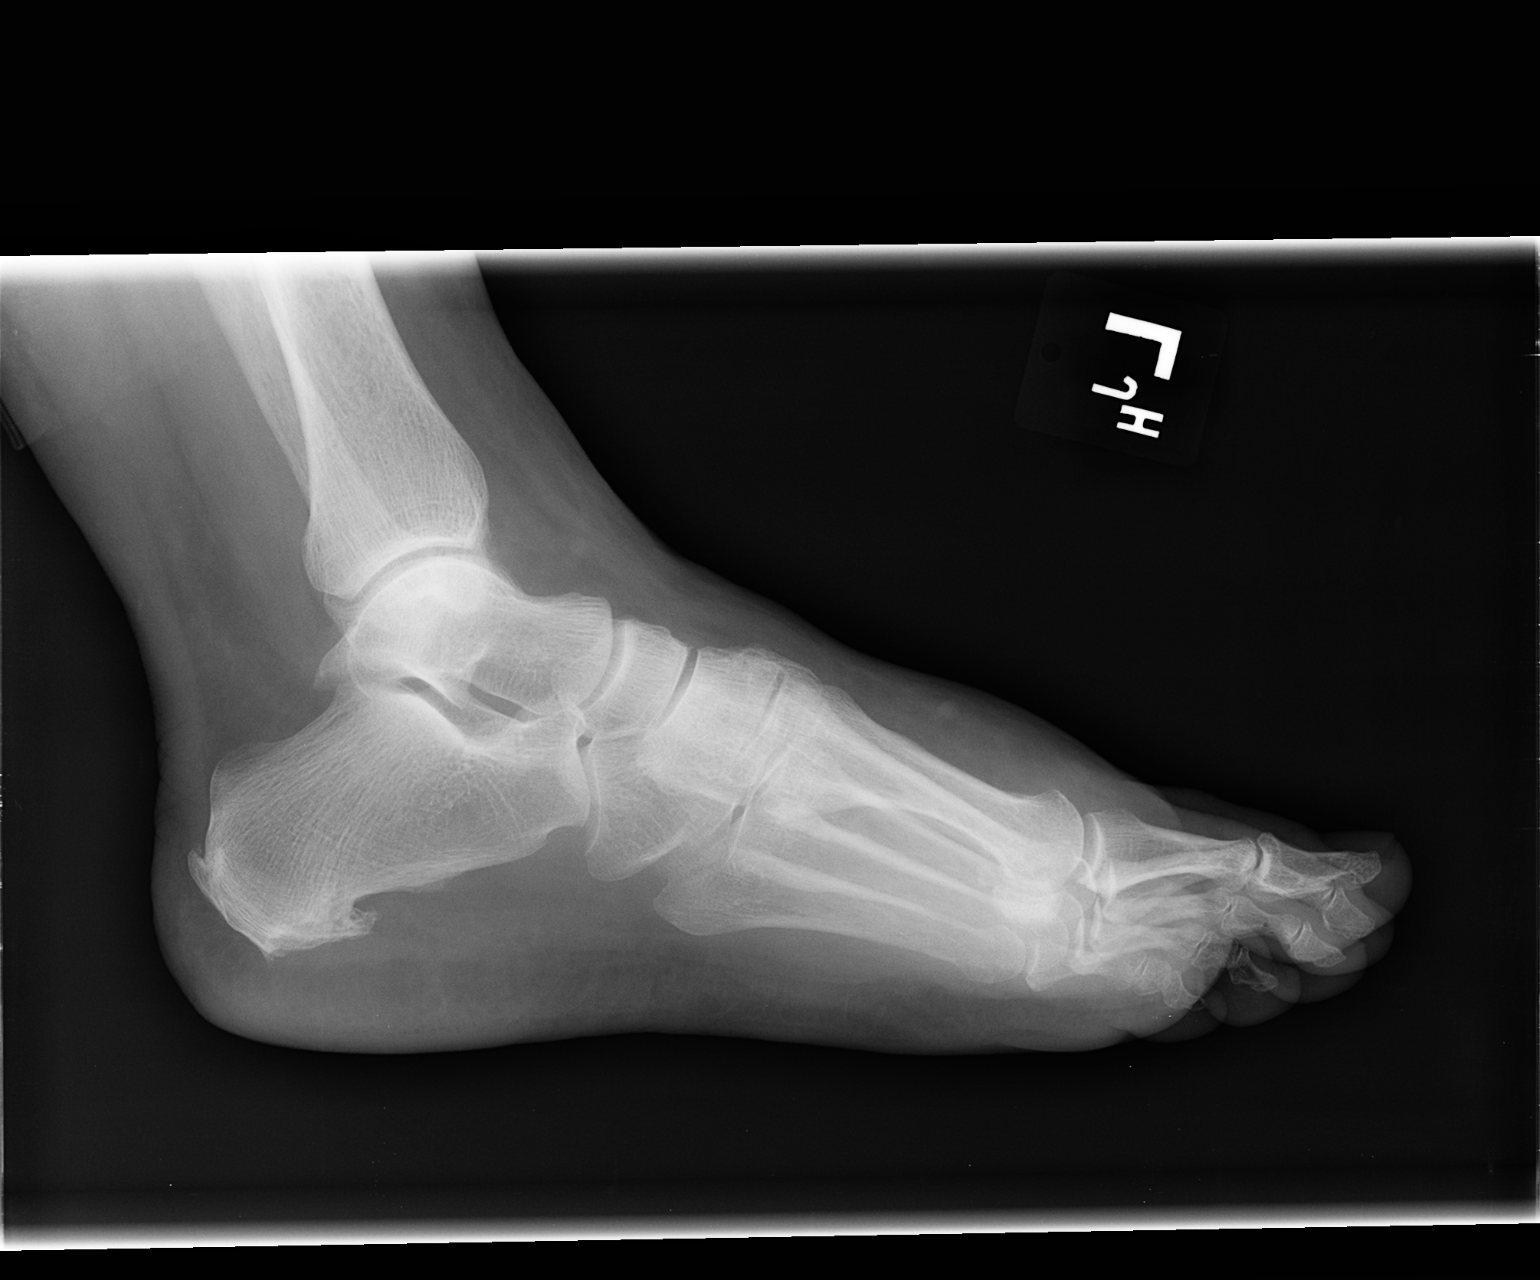

[3 of 3 positions shown; findings below may reference images not displayed]

FINDINGS: Negative for fracture or mass. No significant arthropathy. Spurring
of the calcaneus again noted and unchanged.
IMPRESSION: No acute abnormality.  Negative for fracture.

## 2014-02-01 NOTE — Telephone Encounter (Signed)
Advise office visit. Too many complaints to sort out over the phone.

## 2014-02-01 NOTE — Telephone Encounter (Signed)
Patient called back this morning.  Made appointment for today (02/01/14 @ 12:00 noon).  Patient has to be in [place] at 3:30 this afternoon, unable to do late afternoon appointment.

## 2014-02-01 NOTE — Telephone Encounter (Signed)
Patient coming in Tuesday

## 2014-02-01 NOTE — Progress Notes (Signed)
   Subjective:    Patient ID: [name], female    DOB: 01/16/1951, 63 y.o.   MRN: [id number]  HPI Had apparent gouty attack left foot October 2013. At that time was seen at an urgent care. I do not have lab work that was done there in 2013. No recurrence since that time. Responded to steroids and doxycycline. Has had recent URI followed by Norovirus gastroenteritis weekend of January 24th. Now has swelling, redness, tenderness along the dorsal aspect of left foot fourth metatarsal. Has not been drinking excessive amounts of wine or eating excessive amounts of red meat. History of diabetes mellitus. Chronic kidney disease stage IV is followed by Dr. Lorrene Reid. Symptoms in foot began last evening. No known injury to foot.    Review of Systems     Objective:   Physical Exam swelling dorsal aspect left foot with extreme tenderness along the fourth metatarsal with some slight increased warmth. X-ray of foot shows no evidence of stress fracture. Lab work including uric acid and CBC with differential pending.        Assessment & Plan:  Possible gouty arthritis left foot versus cellulitis. Do not see any portal of entry for cellulitis.  Plan: Lab work is pending. However the patient will start Sterapred DS 10 mg 6 day dosepak.

## 2014-02-01 NOTE — Progress Notes (Signed)
Patient informed. 

## 2014-02-01 NOTE — Patient Instructions (Signed)
Start six-day Sterapred DS 10 mg dosepak. Lab work is pending. Have x-ray of left foot

## 2014-02-02 NOTE — Progress Notes (Signed)
Patient advised. Labs faxed to Dr. Lorrene Reid.

## 2014-02-14 ENCOUNTER — Other Ambulatory Visit: Payer: Self-pay | Admitting: Internal Medicine

## 2014-02-14 ENCOUNTER — Other Ambulatory Visit: Payer: BC Managed Care – PPO | Admitting: Internal Medicine

## 2014-02-14 DIAGNOSIS — I10 Essential (primary) hypertension: Secondary | ICD-10-CM

## 2014-02-14 DIAGNOSIS — E119 Type 2 diabetes mellitus without complications: Secondary | ICD-10-CM

## 2014-02-14 DIAGNOSIS — Z79899 Other long term (current) drug therapy: Secondary | ICD-10-CM

## 2014-02-14 DIAGNOSIS — Z130 Encounter for screening for diseases of the blood and blood-forming organs and certain disorders involving the immune mechanism: Secondary | ICD-10-CM

## 2014-02-14 DIAGNOSIS — Z13228 Encounter for screening for other metabolic disorders: Secondary | ICD-10-CM

## 2014-02-14 DIAGNOSIS — Z1329 Encounter for screening for other suspected endocrine disorder: Secondary | ICD-10-CM

## 2014-02-14 DIAGNOSIS — E785 Hyperlipidemia, unspecified: Secondary | ICD-10-CM

## 2014-02-14 LAB — CBC WITH DIFFERENTIAL/PLATELET
Basophils Absolute: 0.1 10*3/uL (ref 0.0–0.1)
Basophils Relative: 1 % (ref 0–1)
Eosinophils Absolute: 0.2 10*3/uL (ref 0.0–0.7)
Eosinophils Relative: 3 % (ref 0–5)
HCT: 44.2 % (ref 36.0–46.0)
Hemoglobin: 14.9 g/dL (ref 12.0–15.0)
Lymphocytes Relative: 28 % (ref 12–46)
Lymphs Abs: 2 10*3/uL (ref 0.7–4.0)
MCH: 31.6 pg (ref 26.0–34.0)
MCHC: 33.7 g/dL (ref 30.0–36.0)
MCV: 93.8 fL (ref 78.0–100.0)
Monocytes Absolute: 0.7 10*3/uL (ref 0.1–1.0)
Monocytes Relative: 9 % (ref 3–12)
Neutro Abs: 4.3 10*3/uL (ref 1.7–7.7)
Neutrophils Relative %: 59 % (ref 43–77)
Platelets: 284 10*3/uL (ref 150–400)
RBC: 4.71 MIL/uL (ref 3.87–5.11)
RDW: 15.1 % (ref 11.5–15.5)
WBC: 7.3 10*3/uL (ref 4.0–10.5)

## 2014-02-14 LAB — COMPREHENSIVE METABOLIC PANEL
ALT: 25 U/L (ref 0–35)
AST: 19 U/L (ref 0–37)
Albumin: 3.9 g/dL (ref 3.5–5.2)
Alkaline Phosphatase: 87 U/L (ref 39–117)
BUN: 34 mg/dL — ABNORMAL HIGH (ref 6–23)
CO2: 23 mEq/L (ref 19–32)
Calcium: 9.5 mg/dL (ref 8.4–10.5)
Chloride: 109 mEq/L (ref 96–112)
Creat: 1.98 mg/dL — ABNORMAL HIGH (ref 0.50–1.10)
Glucose, Bld: 102 mg/dL — ABNORMAL HIGH (ref 70–99)
Potassium: 5.2 mEq/L (ref 3.5–5.3)
Sodium: 143 mEq/L (ref 135–145)
Total Bilirubin: 0.5 mg/dL (ref 0.2–1.2)
Total Protein: 6.3 g/dL (ref 6.0–8.3)

## 2014-02-14 LAB — LIPID PANEL
Cholesterol: 200 mg/dL (ref 0–200)
HDL: 34 mg/dL — ABNORMAL LOW (ref >39)
LDL Cholesterol: 89 mg/dL (ref 0–99)
Total CHOL/HDL Ratio: 5.9 Ratio
Triglycerides: 383 mg/dL — ABNORMAL HIGH (ref <150)
VLDL: 77 mg/dL — ABNORMAL HIGH (ref 0–40)

## 2014-02-14 LAB — HEMOGLOBIN A1C
Hgb A1c MFr Bld: 6.3 % — ABNORMAL HIGH (ref <5.7)
Mean Plasma Glucose: 134 mg/dL — ABNORMAL HIGH (ref <117)

## 2014-02-15 ENCOUNTER — Other Ambulatory Visit: Payer: BC Managed Care – PPO | Admitting: Internal Medicine

## 2014-02-15 LAB — TSH: TSH: 1.336 u[IU]/mL (ref 0.350–4.500)

## 2014-02-15 LAB — VITAMIN D 25 HYDROXY (VIT D DEFICIENCY, FRACTURES): Vit D, 25-Hydroxy: 57 ng/mL (ref 30–89)

## 2014-02-17 ENCOUNTER — Ambulatory Visit (INDEPENDENT_AMBULATORY_CARE_PROVIDER_SITE_OTHER): Payer: BC Managed Care – PPO | Admitting: Internal Medicine

## 2014-02-17 ENCOUNTER — Encounter: Payer: Self-pay | Admitting: Internal Medicine

## 2014-02-17 VITALS — BP 116/84 | HR 88 | Temp 98.8°F | Ht 64.0 in | Wt 210.0 lb

## 2014-02-17 DIAGNOSIS — E8881 Metabolic syndrome: Secondary | ICD-10-CM

## 2014-02-17 DIAGNOSIS — I10 Essential (primary) hypertension: Secondary | ICD-10-CM

## 2014-02-17 DIAGNOSIS — Z23 Encounter for immunization: Secondary | ICD-10-CM

## 2014-02-17 DIAGNOSIS — Z8739 Personal history of other diseases of the musculoskeletal system and connective tissue: Secondary | ICD-10-CM

## 2014-02-17 DIAGNOSIS — E119 Type 2 diabetes mellitus without complications: Secondary | ICD-10-CM

## 2014-02-17 DIAGNOSIS — E669 Obesity, unspecified: Secondary | ICD-10-CM

## 2014-02-17 DIAGNOSIS — N184 Chronic kidney disease, stage 4 (severe): Secondary | ICD-10-CM

## 2014-02-17 DIAGNOSIS — E785 Hyperlipidemia, unspecified: Secondary | ICD-10-CM

## 2014-02-17 DIAGNOSIS — Z0000 Encounter for general adult medical examination without abnormal findings: Secondary | ICD-10-CM

## 2014-02-17 LAB — POCT URINALYSIS DIPSTICK
Bilirubin, UA: NEGATIVE
Blood, UA: NEGATIVE
Glucose, UA: NEGATIVE
Ketones, UA: NEGATIVE
Leukocytes, UA: NEGATIVE
Nitrite, UA: NEGATIVE
Spec Grav, UA: 1.01
Urobilinogen, UA: NEGATIVE
pH, UA: 6.5

## 2014-02-17 MED ORDER — METHYLPREDNISOLONE 4 MG PO TABS: ORAL_TABLET | ORAL | Status: DC

## 2014-02-17 MED ORDER — TETANUS-DIPHTH-ACELL PERTUSSIS 5-2.5-18.5 LF-MCG/0.5 IM SUSP: 0.5000 mL | Freq: Once | INTRAMUSCULAR | Status: DC

## 2014-02-17 NOTE — Progress Notes (Signed)
Subjective:    Patient ID: [name], female    DOB: February 02, 1951, 63 y.o.   MRN: [id number]  HPI History of hypertension, hyperlipidemia, chronic kidney disease stage IV, diabetes, metabolic syndrome in today for health maintenance exam and evaluation of medical issues. Seen earlier this month for foot pain thought either to be due to gout or cellulitis. Was treated with prednisone and improved. Uric acid was elevated at 9.1. Probably should be on allopurinol 100 mg daily.  In 2006/03/28 patient had right-sided flank pain and was thought to have nephrolithiasis. 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 Creatinine was mildly elevated at 1.5. However in January 2008 she was admitted with dehydration and renal insufficiency. Creatinine initially was 1.6 but improved with hydration to 1.39. She was on antibiotics for wound infection and had nausea and vomiting.  In July 2009, she was admitted with acute renal failure with creatinine up to 6.0. She had a left ureteral stent placed 625 and 9 following a renogram showing poor function and poor clearance of the tracer from the left kidney as well as increase in retention in the right kidney suggesting partial obstruction. Retrograde pyelogram showed no evidence of obstruction. It was never exactly clear what caused the acute renal failure. It was thought to be perhaps an acute on chronic injury. There was possible contribution from obstruction, taking ARB and metformin.  Patient has a history of significant hypertriglyceridemia. Unable to tolerate TriCor because she apparently developed a fever as a side effect. This was discontinued in the summer of 2012.  Bel trying to manage hyperlipidemia with Lipitor and fish oil.  History of large right lower abdominal hernia repair by Dr. Theda Sers at [facility] in 03-28-2009.  Subsequently underwent an incisional  ventral hernia repair with mesh December 2011 by Dr. Dossie Der. Baseline creatinine was 2.24 at that point. She has been on the kidney transplant list but removed because of recent improvement.  Patient had ganglion cyst removed from right wrist 1959, cesarean section 1982-03-28, D&C 1998.  Patient is allergic to sulfa and TriCor. Says Altace as caused a cough.  Social history: She is married with one adult daughter. Does not smoke. Occasional wine consumption.  Family history: Father with history of aortic valve replacement, hyperlipidemia, rheumatic heart disease. Mother with history of hypertension, hypothyroidism, hyperlipidemia COPD. One sister died in 2004/03/28 with coronary artery disease and hyperlipidemia. One sister living with hyperlipidemia.  Pneumovax immunization in May 2007. Tetanus immunization February 2005. Gets annual influenza immunization.  Additional medical problems include GE reflux, allergic rhinitis, dependent edema, osteopenia, history of carpal tunnel syndrome in 03-28-2005.    Review of Systems  Constitutional: Positive for fatigue.  Respiratory:       Frequent upper respiratory infection  Cardiovascular: Negative.   Gastrointestinal: Negative.   Allergic/Immunologic: Positive for environmental allergies.  Neurological: Negative.   Psychiatric/Behavioral: Negative.        Objective:   Physical Exam  Vitals reviewed. Constitutional: She is oriented to person, place, and time. She appears well-developed and well-nourished. No distress.  HENT:  Head: Normocephalic and atraumatic.  Right Ear: External ear normal.  Left Ear: External ear normal.  Mouth/Throat: Oropharynx is clear and moist. No oropharyngeal exudate.  Eyes: Pupils are equal, round, and reactive to light. Right eye exhibits no discharge. Left eye exhibits no discharge. No scleral icterus.  Neck: Neck supple. No JVD  present. No thyromegaly present.  Cardiovascular: Normal rate, regular rhythm, normal heart  sounds and intact distal pulses.   No murmur heard. Pulmonary/Chest: Effort normal and breath sounds normal. No respiratory distress. She has no rales. She exhibits no tenderness.  Breasts normal female  Abdominal: Soft. Bowel sounds are normal. She exhibits no distension and no mass. There is no tenderness. There is no rebound and no guarding.  Genitourinary:  Deferred to GYN  Musculoskeletal: Normal range of motion. She exhibits no edema.  Lymphadenopathy:    She has no cervical adenopathy.  Neurological: She is alert and oriented to person, place, and time. She has normal reflexes. No cranial nerve deficit. Coordination normal.  Skin: Skin is warm and dry. No rash noted. She is not diaphoretic.  Psychiatric: She has a normal mood and affect. Her behavior is normal. Judgment and thought content normal.          Assessment & Plan:  Chronic kidney disease stage IV  Hypertension  Hyperlipidemia  Type 2 diabetes mellitus  Metabolic syndrome  Osteopenia  Probable gout  GE reflux  History of allergic rhinitis  Obesity  Plan: Refer to rheumatologist for evaluation of possible gout. Return in 6 months. Continue to see Dr. Lorrene Reid for kidney disease.

## 2014-02-17 NOTE — Patient Instructions (Signed)
We will arrange for you to see rheumatologist regarding elevated uric acid level and possible gout. Return in 6 months. Keep appointment with Dr. Lorrene Reid in March.

## 2014-02-22 ENCOUNTER — Telehealth: Payer: Self-pay | Admitting: Internal Medicine

## 2014-02-22 NOTE — Telephone Encounter (Signed)
Patient notified and confirmed. 

## 2014-02-23 ENCOUNTER — Ambulatory Visit: Payer: BC Managed Care – PPO | Admitting: "Endocrinology

## 2014-04-11 ENCOUNTER — Ambulatory Visit (INDEPENDENT_AMBULATORY_CARE_PROVIDER_SITE_OTHER): Payer: BC Managed Care – PPO | Admitting: "Endocrinology

## 2014-04-11 ENCOUNTER — Encounter: Payer: Self-pay | Admitting: "Endocrinology

## 2014-04-11 VITALS — BP 136/84 | HR 110 | Wt 214.0 lb

## 2014-04-11 DIAGNOSIS — G909 Disorder of the autonomic nervous system, unspecified: Secondary | ICD-10-CM

## 2014-04-11 DIAGNOSIS — M109 Gout, unspecified: Secondary | ICD-10-CM

## 2014-04-11 DIAGNOSIS — E1169 Type 2 diabetes mellitus with other specified complication: Secondary | ICD-10-CM

## 2014-04-11 DIAGNOSIS — E1142 Type 2 diabetes mellitus with diabetic polyneuropathy: Secondary | ICD-10-CM

## 2014-04-11 DIAGNOSIS — E11649 Type 2 diabetes mellitus with hypoglycemia without coma: Secondary | ICD-10-CM

## 2014-04-11 DIAGNOSIS — IMO0001 Reserved for inherently not codable concepts without codable children: Secondary | ICD-10-CM

## 2014-04-11 DIAGNOSIS — E1149 Type 2 diabetes mellitus with other diabetic neurological complication: Secondary | ICD-10-CM

## 2014-04-11 DIAGNOSIS — R609 Edema, unspecified: Secondary | ICD-10-CM

## 2014-04-11 DIAGNOSIS — E1165 Type 2 diabetes mellitus with hyperglycemia: Principal | ICD-10-CM

## 2014-04-11 DIAGNOSIS — E049 Nontoxic goiter, unspecified: Secondary | ICD-10-CM

## 2014-04-11 DIAGNOSIS — E669 Obesity, unspecified: Secondary | ICD-10-CM

## 2014-04-11 DIAGNOSIS — E782 Mixed hyperlipidemia: Secondary | ICD-10-CM

## 2014-04-11 DIAGNOSIS — K219 Gastro-esophageal reflux disease without esophagitis: Secondary | ICD-10-CM

## 2014-04-11 DIAGNOSIS — E063 Autoimmune thyroiditis: Secondary | ICD-10-CM

## 2014-04-11 DIAGNOSIS — E1143 Type 2 diabetes mellitus with diabetic autonomic (poly)neuropathy: Secondary | ICD-10-CM

## 2014-04-11 LAB — POCT GLYCOSYLATED HEMOGLOBIN (HGB A1C): Hemoglobin A1C: 6.6

## 2014-04-11 LAB — GLUCOSE, POCT (MANUAL RESULT ENTRY): POC Glucose: 166 mg/dl — AB (ref 70–99)

## 2014-04-11 NOTE — Progress Notes (Signed)
Subjective:  Patient Name: [name] Date of Birth: [birth year]  MRN: [id number]  [name]  presents to the office today for follow-up of her type 2 diabetes, obesity, goiter, autonomic neuropathy, tachycardia, peripheral neuropathy, edema, dyspepsia, chronic renal insufficiency, tinea pedis, and combined hyperlipidemia.  HISTORY OF PRESENT ILLNESS:   [name] is a 63 y.o. Caucasian woman. [name] was unaccompanied.  1. The patient was first referred to me on 12/19/05 by her primary care provider, Dr. Emeline General, for evaluation and co-management of type 2 diabetes and obesity.   2. During the past 8 years we've tried her on several different medication regimens. When she developed chronic renal insufficiency in 2009, all metformin-containing medications were discontinued. In April 2010 I started her on Victoza at a dose of 0.6 mg/day. As of her clinic visit on 05/13/12 she had increased the Victoza dose to the 0.6 mg setting plus 4 additional clicks.  3. The patient's last [facility] visit was on 08/19/13. In the interim she has been healthy. Her nephrologist, Dr. Jamal Maes, put her on Uloric last week for her intractable gout. She has been on prednisone on and off and on for three months for treatment of her gout. She will take 5 mg of prednisone daily for the next three months while the Uloric is taking effect. [name] is under the impression that her CRI is stable.  She remains on Victoza, 0.6 mg plus 4 clicks, atorvastatin, atenolol, and vitamin D.   4. Pertinent Review of Systems:  Constitutional: The patient feels "good".  Eyes: Vision is good as long as she wears her glasses. Her last dilated eye examination was in May 2014. There were no signs of diabetic retinopathy. Her next scheduled FU exam is May 2015. There are no significant eye complaints. Neck: The patient has no complaints of anterior neck swelling, soreness, tenderness,  discomfort, or difficulty swallowing.  She has had some hair loss at the vertex.  Heart: Heart rate increases with exercise or other physical activity. The patient has no complaints of palpitations, irregular heat beats, chest pain, or chest pressure. Her stress test at [facility] in late March 2014 was fine. There were no signs of ASHD.  Gastrointestinal: Dr. Collene Mares did her colonoscopy in June 2014. Five polyps were seen and removed. She will have a repeat colonoscopy in five years.  She occasionally has some problems with reflux, but is much improved since resuming sodium bicarb. Bowel movents seem normal. The patient has no complaints of excessive hunger, stomach aches or pains, diarrhea, or constipation. Legs: Muscle mass and strength seem normal. There are no complaints of numbness, tingling, burning, or pain. She still has some leg edema at times.  Feet: She continues to have gout symptoms in her great toes and forefeet. She is able to walk without difficulties. There are no other obvious foot problems. There are no complaints of numbness, tingling, burning, or pain. No edema is noted. Hands: She feels that her hands are good. She has some stiffness, but not much. Her AM hand numbness has resolved.   Hypoglycemia: None GU: She saw Dr. Lorrene Reid in March 2015. She remains on the inactive transplant list.  4. BG printout: She has not been checking BGs very often recently.   AM BGs were 81-125. Her one dinner  BG was 154.   PAST MEDICAL, FAMILY, AND SOCIAL HISTORY:  Past Medical History  Diagnosis Date  . Hypercholesteremia   . Allergic rhinitis   . GERD (gastroesophageal reflux  disease)   . Type II or unspecified type diabetes mellitus without mention of complication, not stated as uncontrolled   . Essential hypertension, benign   . Menopause   . Osteopenia   . Dependent edema   . Carbuncle, trunk   . Carpal tunnel syndrome   . Cyclical vomiting   . Chronic kidney disease   . Diabetes mellitus type II   . Goiter   . Obesity   .  Diabetic peripheral neuropathy associated with type 2 diabetes mellitus   . Diabetic autonomic neuropathy   . Tachycardia   . Combined hyperlipidemia associated with type 2 diabetes mellitus   . Dyspepsia     Family History  Problem Relation Age of Onset  . COPD Mother   . Hypertension Mother   . Thyroid disease Mother   . Heart disease Father   . Hyperlipidemia Father   . Diabetes Father   . Heart disease Sister   . Cancer Maternal Grandfather   . Diabetes Paternal Grandmother     Current outpatient prescriptions:aspirin 81 MG chewable tablet, Chew 81 mg by mouth daily.  , Disp: , Rfl: ;  atenolol (TENORMIN) 100 MG tablet, Take 100 mg by mouth daily.  , Disp: , Rfl: ;  atorvastatin (LIPITOR) 40 MG tablet, Take 1 tablet (40 mg total) by mouth daily., Disp: 30 tablet, Rfl: 11;  Cholecalciferol (VITAMIN D) 2000 UNITS CAPS, Take by mouth.  , Disp: , Rfl: ;  febuxostat (ULORIC) 40 MG tablet, Take 40 mg by mouth., Disp: , Rfl:  glucose blood (ONETOUCH VERIO) test strip, Check blood sugar 10 x daily, Disp: 300 each, Rfl: 3;  Insulin Pen Needle (B-D UF III MINI PEN NEEDLES) 31G X 5 MM MISC, Use twice daily with injection, Disp: 100 each, Rfl: 6;  Lancets (ONETOUCH ULTRASOFT) lancets, Use as instructed, Disp: 100 each, Rfl: 12;  Liraglutide (VICTOZA [place]), Inject 0.6 mg into the skin daily.  , Disp: , Rfl:  methylPREDNISolone (MEDROL) 4 MG tablet, Taking tapering course as directed 6-5-4-3-2-1 taper, Disp: 21 tablet, Rfl: 0;  Multiple Vitamins-Minerals (MULTIVITAMIN WITH MINERALS) tablet, Take 1 tablet by mouth daily.  , Disp: , Rfl: ;  sodium bicarbonate 650 MG tablet, Take 650 mg by mouth 2 (two) times daily. 3 in am 3 in pm, Disp: , Rfl: ;  chlorpheniramine-HYDROcodone (TUSSIONEX) 10-8 MG/5ML LQCR, , Disp: , Rfl:  omega-3 acid ethyl esters (LOVAZA) 1 G capsule, Take 1 g by mouth daily.  , Disp: , Rfl: ;  omeprazole (PRILOSEC) 20 MG capsule, Take 20 mg by mouth daily.  , Disp: , Rfl: ;  promethazine  (PHENERGAN) 25 MG tablet, Take 1 tablet (25 mg total) by mouth every 8 (eight) hours as needed for nausea., Disp: 30 tablet, Rfl: 1 Current facility-administered medications:methylPREDNISolone acetate (DEPO-MEDROL) injection 80 mg, 80 mg, Intramuscular, Once, Elby Showers, MD;  Tdap (BOOSTRIX) injection 0.5 mL, 0.5 mL, Intramuscular, Once, Elby Showers, MD  Allergies as of 04/11/2014 - Review Complete 04/11/2014  Allergen Reaction Noted  . Fenofibrate  06/03/2011  . Orange fruit [citrus]  05/13/2012  . Ramipril Cough 06/03/2011  . Sulfa antibiotics  04/30/2011    1. Work and Family: The patient retired August 1st 2014.  2. Activities: She is doing some yoga 3 times per week. She is not doing any regular exercise.   3. Smoking, alcohol, or drugs: None 4. Primary Care Provider: Elby Showers, MD 5. Nephrology: Dr. Jamal Maes 6. Urology: Dr. Raynelle Bring 7.  GI: Dr. Juanita Craver  REVIEW OF SYSTEMS: There are no other significant problems involving her other body systems.   Objective:  Vital Signs:  BP 136/84  Pulse 110  Wt 214 lb (97.07 kg)   Ht Readings from Last 3 Encounters:  02/17/14 5\' 4"  (1.626 m)  04/20/13 5\' 4"  (1.626 m)  01/15/13 5\' 4"  (1.626 m)   Wt Readings from Last 3 Encounters:  04/11/14 214 lb (97.07 kg)  02/17/14 210 lb (95.255 kg)  02/01/14 216 lb (97.977 kg)   There is no height on file to calculate BSA.  PHYSICAL EXAM:  Constitutional: The patient appears healthy, but obese. She is alert and bright. She has lost 7 pounds since last visit.  Face: The face appears normal.  Eyes: There is no obvious arcus or proptosis, but her eyes are relatively prominent. Moisture appears normal. Mouth: The oropharynx and tongue appear normal. Oral moisture is normal. Neck: The neck appears to be visibly normal. No carotid bruits are noted. The thyroid gland is low-lying. The thyroid gland is smaller at about 20 grams in size. Both lobes are within normal limits for  size. The consistency of the lobes is normal. The thyroid gland is uncomfortable to palpation on the right side today.  Lungs: The lungs are clear to auscultation. Air movement is good. Heart: Heart rate and rhythm are regular. Heart sounds S1 and S2 are normal. I did not appreciate any pathologic cardiac murmurs. Abdomen: The abdomen is enlarged in size. Bowel sounds are normal. There is no obvious hepatomegaly, splenomegaly, or other mass effect. There was no tenderness to deep palpation.  Arms: Muscle size and bulk are normal for age.  Hands: There is no obvious tremor. Phalangeal and metacarpophalangeal joints are normal. Palmar muscles are normal. Palmar skin is normal. Palmar moisture is also normal. Legs: Muscles appear normal for age. She has no edema. Feet: Feet are normally formed. Dorsalis pedal pulses are normal 1+ bilaterally. Her tinea pedis is inactive. She has 2+ calluses. Neurologic: Strength is normal for age in both the upper and lower extremities. Muscle tone is normal. Sensation to touch is normal in both legs, but is slightly decreased in the heels.    LAB DATA:Labs: HbA1c is 6.6% today, compared with 5.7% at last visit and with 6.1% at the visit prior.  Labs 02/14/14: HbA1c 6.3%; creatinine 2.12,   Labs 08/09/13: TSH 1.212, free T4 1.03, free T3 2.7; microalbumin/creatinine ratio 241.8; cholesterol 201, triglycerides 462, HDL 35, LDL not calculated; CO2 17, creatinine 1.99, calcium 9.6  Labs 01/15/13: Urine microalbumin 7.20 (0-1.89), hemoglobin 15.2, hematocrit 44.2; BUN 35, creatinine 1.99; cholesterol 201, triglycerides 482, HDL 33; TSH 0.970; 25-hydroxy vitamin D 45; HbA1c 6.5%  Labs: 06/30/12: Cholesterol 235, triglycerides 391, HDL 32, LDL 121  Labs: 01/06/12: Cholesterol 218, triglycerides 351, HDL 34, and LDL 114 (increased from 99).  TSH 1.608, free T4 1.20, potassium 5.8, creatinine 2.66, vitamin D 46   Assessment and Plan:   ASSESSMENT:  1. Type 2 diabetes  mellitus: Patient's HbA1c is much worse today than at last visit, c/w taking so much prednisone. She continues to tolerate Victoza quite well.  2. Hypoglycemia: None 3. Peripheral neuropathy: She has mild signs of peripheral neuropathy today. 4. Autonomic neuropathy with tachycardia: Her heart rate is a bit higher today, c/w her increase in HbA1c. With improvement in blood glucose control over time, the neuropathy and tachycardia will improve, but there is a lag time of 6-9 months before we see  the improvement. 5. Hyperlipidemia: The patient is under treatment with Lipitor. Her cholesterol was lower, but her triglycerides were higher at last visit. She needs tighter diet now and more exercise once her gout is under control.  6. Goiter/thyroiditis: Her thyroid gland is smaller today. The patient was euthyroid in August 2014. The waxing and waning of her thyroid gland size suggest that the patient has intermittent Hashimoto's disease activity. Her right thyroid lobe is mildly uncomfortable today, also due to active thyroiditis.  7. Chronic renal insufficiency: Her renal insufficiency has improved. . 8. Hypertension: Dr. Lorrene Reid is managing her hypertension. Exercise and weight loss would be beneficial for this problem as well. 9. Dyspepsia/GERD: She is doing better since resuming sodium bicarbonate.  10. Gout: She is under very active treatment now. Unfortunately, taking prednisone this long may have an adverse effect on her hypothalamic-pituitary-thyroidal axis. She will need an ACTH stimulation test in the week after she stops prednisone treatment 11. Obesity: Her weight has decreased. 12. Pedal edema: Her edema is not present today.  PLAN: 1. Diagnostic: Repeat urinary microalbumin/creatinine ratio, C-peptide, and TFTs in 3 months.  2. Therapeutic:  If she can reduce her weight, we will not need to increase her meds for hyperlipidemia and hypertension. Try to advance Victoza to 0.6 mg plus 5 clicks or  plus 6 clicks. Check BGs at meals and eat accordingly.  3. Patient education: We again discussed the importance of eating right and exercising right for control of her blood pressure, her weight, and her diabetes. 4. Follow-up: 3 months  Level of Service: This visit lasted in excess of 60 minutes. More than 50% of the visit was devoted to counseling.  Sherrlyn Hock

## 2014-04-11 NOTE — Patient Instructions (Signed)
Follow up visit in 3 months. Call Dr. Tobe Sos 2 weeks prior to stopping prednisone.

## 2014-04-26 ENCOUNTER — Encounter: Payer: Self-pay | Admitting: Internal Medicine

## 2014-04-26 ENCOUNTER — Ambulatory Visit (INDEPENDENT_AMBULATORY_CARE_PROVIDER_SITE_OTHER): Payer: BC Managed Care – PPO | Admitting: Internal Medicine

## 2014-04-26 VITALS — BP 120/80 | HR 109 | Temp 98.4°F | Wt 219.0 lb

## 2014-04-26 DIAGNOSIS — J019 Acute sinusitis, unspecified: Secondary | ICD-10-CM

## 2014-04-26 DIAGNOSIS — H109 Unspecified conjunctivitis: Secondary | ICD-10-CM

## 2014-04-26 MED ORDER — AZITHROMYCIN 250 MG PO TABS: ORAL_TABLET | ORAL | Status: DC

## 2014-04-26 MED ORDER — OFLOXACIN 0.3 % OP SOLN: 2.0000 [drp] | Freq: Four times a day (QID) | OPHTHALMIC | Status: DC

## 2014-04-26 NOTE — Progress Notes (Signed)
   Subjective:    Patient ID: [name], female    DOB: 1951-08-06, 63 y.o.   MRN: [id number]  HPI  Has come down with respiratory infection. Has maxillary sinus tightness and pressure. Some discolored drainage. Also has come down with right eye conjunctivitis. Leaving in a few days to go on ten-day trip to [place]. Is a bit anxious that she will be well by that time.  No fever or shaking chills. No significant sore throat. Worried about ears. Has drainage in the right corner of eye every morning.  Review of Systems     Objective:   Physical Exam Left ear slightly full. Right ear chronically scarred. Pharynx is clear. Neck is supple without adenopathy. Chest clear. Right eye is erythematous. No obvious drainage. Extraocular movements are full.       Assessment & Plan:  Conjunctivitis right eye  Sinusitis  Plan: Zithromax Z-Pak take 2 tabs day one followed by 1 tab days 2 through 5 with 1 refill. Have prescription refilled and take on trip to [place]. Ofloxacin ophthalmic drops 2 drops in right eye 4 times a day for 5 days.

## 2014-04-26 NOTE — Patient Instructions (Addendum)
Use ofloxacin ophthalmic drops in right eye 4 times a day for 5-7 days. Take Zithromax Z-PAK as corrected. Have Z-Pak refilled and take on trip to [place].

## 2014-07-05 ENCOUNTER — Other Ambulatory Visit: Payer: Self-pay | Admitting: Internal Medicine

## 2014-07-14 ENCOUNTER — Ambulatory Visit: Payer: BC Managed Care – PPO | Admitting: "Endocrinology

## 2014-07-28 ENCOUNTER — Encounter: Payer: Self-pay | Admitting: Gastroenterology

## 2014-08-18 ENCOUNTER — Other Ambulatory Visit: Payer: BC Managed Care – PPO | Admitting: Internal Medicine

## 2014-08-18 DIAGNOSIS — I10 Essential (primary) hypertension: Secondary | ICD-10-CM

## 2014-08-18 DIAGNOSIS — E1165 Type 2 diabetes mellitus with hyperglycemia: Principal | ICD-10-CM

## 2014-08-18 DIAGNOSIS — IMO0001 Reserved for inherently not codable concepts without codable children: Secondary | ICD-10-CM

## 2014-08-18 DIAGNOSIS — E782 Mixed hyperlipidemia: Secondary | ICD-10-CM

## 2014-08-18 LAB — LIPID PANEL
Cholesterol: 230 mg/dL — ABNORMAL HIGH (ref 0–200)
HDL: 36 mg/dL — ABNORMAL LOW (ref >39)
Total CHOL/HDL Ratio: 6.4 Ratio
Triglycerides: 533 mg/dL — ABNORMAL HIGH (ref <150)

## 2014-08-18 LAB — BASIC METABOLIC PANEL
BUN: 30 mg/dL — ABNORMAL HIGH (ref 6–23)
CO2: 23 mEq/L (ref 19–32)
Calcium: 9.4 mg/dL (ref 8.4–10.5)
Chloride: 108 mEq/L (ref 96–112)
Creat: 1.82 mg/dL — ABNORMAL HIGH (ref 0.50–1.10)
Glucose, Bld: 102 mg/dL — ABNORMAL HIGH (ref 70–99)
Potassium: 4.9 mEq/L (ref 3.5–5.3)
Sodium: 141 mEq/L (ref 135–145)

## 2014-08-18 LAB — HEPATIC FUNCTION PANEL
ALT: 24 U/L (ref 0–35)
AST: 29 U/L (ref 0–37)
Albumin: 4.1 g/dL (ref 3.5–5.2)
Alkaline Phosphatase: 88 U/L (ref 39–117)
Bilirubin, Direct: 0.1 mg/dL (ref 0.0–0.3)
Indirect Bilirubin: 0.3 mg/dL (ref 0.2–1.2)
Total Bilirubin: 0.4 mg/dL (ref 0.2–1.2)
Total Protein: 6.8 g/dL (ref 6.0–8.3)

## 2014-08-18 LAB — HEMOGLOBIN A1C
Hgb A1c MFr Bld: 6.4 % — ABNORMAL HIGH (ref <5.7)
Mean Plasma Glucose: 137 mg/dL — ABNORMAL HIGH (ref <117)

## 2014-08-19 ENCOUNTER — Ambulatory Visit (INDEPENDENT_AMBULATORY_CARE_PROVIDER_SITE_OTHER): Payer: BC Managed Care – PPO | Admitting: Internal Medicine

## 2014-08-19 ENCOUNTER — Encounter: Payer: Self-pay | Admitting: Internal Medicine

## 2014-08-19 VITALS — BP 122/82 | HR 96 | Wt 221.0 lb

## 2014-08-19 DIAGNOSIS — Z862 Personal history of diseases of the blood and blood-forming organs and certain disorders involving the immune mechanism: Secondary | ICD-10-CM

## 2014-08-19 DIAGNOSIS — Z8739 Personal history of other diseases of the musculoskeletal system and connective tissue: Secondary | ICD-10-CM

## 2014-08-19 DIAGNOSIS — N184 Chronic kidney disease, stage 4 (severe): Secondary | ICD-10-CM

## 2014-08-19 DIAGNOSIS — E669 Obesity, unspecified: Secondary | ICD-10-CM

## 2014-08-19 DIAGNOSIS — Z8639 Personal history of other endocrine, nutritional and metabolic disease: Secondary | ICD-10-CM

## 2014-08-19 DIAGNOSIS — E785 Hyperlipidemia, unspecified: Secondary | ICD-10-CM

## 2014-08-19 DIAGNOSIS — E8881 Metabolic syndrome: Secondary | ICD-10-CM

## 2014-08-19 DIAGNOSIS — E119 Type 2 diabetes mellitus without complications: Secondary | ICD-10-CM

## 2014-08-19 DIAGNOSIS — I10 Essential (primary) hypertension: Secondary | ICD-10-CM

## 2014-08-19 NOTE — Patient Instructions (Addendum)
Return for fasting lipid panel in 3 months. Book physical exam in 6 months. Influenza vaccine in October. Copy of labs faxed to Dr. Lorrene Reid

## 2014-08-19 NOTE — Progress Notes (Signed)
   Subjective:    Patient ID: [name], female    DOB: 07/22/1951, 63 y.o.   MRN: [id number]  HPI   In today for six-month recheck. Blood pressure is 130/82. She is due to see a nephrologist next month. I'm disappointed with her triglycerides which are 533. Her hemoglobin A1c is stable at 6.2%. Has not been watching her diet. She is intolerant of all lipid-lowering medications. I  had a stern talk with her today and told her she needed to watch her diet, exercise and lose weight now that she is retired. There really is no excuse anymore. She has multiple health problems. Have given her the name of a dietitian. I want to repeat fasting lipid panel in 3 months. She will get influenza immunization early October. Creatinine is stable.    Review of Systems     Objective:   Physical Exam  Neck is supple without JVD thyromegaly or carotid bruits. Chest clear. Cardiac exam regular rate and rhythm. Extremities without edema.      Assessment & Plan:  Hyperlipidemia-triglycerides 533  Obesity  Metabolic syndrome  Chronic kidney disease stage IV  Hypertension-stable  Controlled type 2 diabetes  Plan: Return in 3 months for fasting lipid panel. Return to see me in 6 months for physical examination. Flu vaccine October.

## 2014-09-27 ENCOUNTER — Ambulatory Visit: Payer: BC Managed Care – PPO | Admitting: "Endocrinology

## 2014-10-05 ENCOUNTER — Ambulatory Visit: Payer: BC Managed Care – PPO | Admitting: Internal Medicine

## 2014-10-06 ENCOUNTER — Ambulatory Visit (INDEPENDENT_AMBULATORY_CARE_PROVIDER_SITE_OTHER): Payer: BC Managed Care – PPO | Admitting: "Endocrinology

## 2014-10-06 ENCOUNTER — Encounter: Payer: Self-pay | Admitting: "Endocrinology

## 2014-10-06 VITALS — BP 124/85 | HR 124 | Wt 210.0 lb

## 2014-10-06 DIAGNOSIS — E1165 Type 2 diabetes mellitus with hyperglycemia: Secondary | ICD-10-CM

## 2014-10-06 DIAGNOSIS — E669 Obesity, unspecified: Secondary | ICD-10-CM

## 2014-10-06 DIAGNOSIS — N184 Chronic kidney disease, stage 4 (severe): Secondary | ICD-10-CM

## 2014-10-06 DIAGNOSIS — E1143 Type 2 diabetes mellitus with diabetic autonomic (poly)neuropathy: Secondary | ICD-10-CM | POA: Diagnosis not present

## 2014-10-06 DIAGNOSIS — M109 Gout, unspecified: Secondary | ICD-10-CM

## 2014-10-06 DIAGNOSIS — E063 Autoimmune thyroiditis: Secondary | ICD-10-CM

## 2014-10-06 DIAGNOSIS — R609 Edema, unspecified: Secondary | ICD-10-CM

## 2014-10-06 DIAGNOSIS — E11649 Type 2 diabetes mellitus with hypoglycemia without coma: Secondary | ICD-10-CM | POA: Diagnosis not present

## 2014-10-06 DIAGNOSIS — G629 Polyneuropathy, unspecified: Secondary | ICD-10-CM

## 2014-10-06 DIAGNOSIS — E1142 Type 2 diabetes mellitus with diabetic polyneuropathy: Secondary | ICD-10-CM | POA: Diagnosis not present

## 2014-10-06 DIAGNOSIS — E049 Nontoxic goiter, unspecified: Secondary | ICD-10-CM

## 2014-10-06 DIAGNOSIS — IMO0002 Reserved for concepts with insufficient information to code with codable children: Secondary | ICD-10-CM

## 2014-10-06 DIAGNOSIS — M10072 Idiopathic gout, left ankle and foot: Secondary | ICD-10-CM

## 2014-10-06 LAB — GLUCOSE, POCT (MANUAL RESULT ENTRY): POC Glucose: 144 mg/dl — AB (ref 70–99)

## 2014-10-06 LAB — POCT GLYCOSYLATED HEMOGLOBIN (HGB A1C): Hemoglobin A1C: 6

## 2014-10-06 NOTE — Progress Notes (Addendum)
Subjective:  Patient Name: [name] Date of Birth: July 15, 1951  MRN: [id number]  [name]  presents to the office today for follow-up of her type 2 diabetes, obesity, goiter, autonomic neuropathy, tachycardia, peripheral neuropathy, edema, dyspepsia, chronic renal insufficiency, tinea pedis, and combined hyperlipidemia.  HISTORY OF PRESENT ILLNESS:   [name] is a 63 y.o. Caucasian woman. [name] was unaccompanied.  1. The patient was first referred to me on 12/19/05 by her primary care provider, Dr. Emeline General, for evaluation and co-management of type 2 diabetes and obesity.   2. During the past 8 years we've tried her on several different medication regimens. When she developed chronic renal insufficiency in 2009, all metformin-containing medications were discontinued. In April 2010 I started her on Victoza at a dose of 0.6 mg/day. As of her clinic visit on 05/13/12 she had increased the Victoza dose to the 0.6 mg setting plus 4 additional clicks.  3. The patient's last [facility] visit was on 04/11/14. In the interim she had been healthy until yesterday, when her temperature rose to 102-103 and she developed bilateral flank pains. These symptoms are typical of her UTI symptoms. She has a call into her nephrologist, Dr. Jamal Maes, now. She has been on prednisone on and off and on for three months for treatment of her gout, despite being on Uloric. Her creatinine was a bit higher at her last visit with Dr. Lorrene Reid. She remains on Victoza, 0.6 mg plus 4 clicks, atorvastatin, atenolol, and vitamin D.   4. Pertinent Review of Systems:  Constitutional: The patient feels "sick" today.  Eyes: Vision is good as long as she wears her glasses. Her last dilated eye examination was in September 2015. There were no signs of diabetic eye disease.  Neck: The patient has no complaints of anterior neck swelling, soreness, tenderness,  discomfort, or difficulty swallowing. She has had some hair loss  at the vertex.  Heart: Heart rate increases with exercise or other physical activity. The patient has no complaints of palpitations, irregular heat beats, chest pain, or chest pressure. Her stress test at [facility] in late March 2014 was fine. There were no signs of ASHD.  Gastrointestinal: She is nauseated today due to her presumed kidney infection. She has not had any problems with reflux since resuming sodium bicarb. Bowel movents seem normal. The patient has no complaints of excessive hunger, stomach aches or pains, diarrhea, or constipation. Legs: Muscle mass and strength seem normal. There are no complaints of numbness, tingling, burning, or pain. She still has some leg edema at times.  Feet: She continues to have gout symptoms in her left great toes, forefoot, and ankle. She is able to walk without difficulties. There are no other obvious foot problems. There are no complaints of numbness, tingling, burning, or pain. No edema is noted. Hands: She feels that her hands are good. She has some stiffness, but not much. Her AM hand numbness has resolved.   Hypoglycemia: None GU: She saw Dr. Lorrene Reid in September 2015. She remains on the inactive transplant list.  4. BG printout: She has been checking BGs more often recently.   AM BGs were 71-128. The 128 was yesterday as she was developing her kidney infection. Her one dinner  BG was 103. Her two bedtime BGs were 94 and 102.   PAST MEDICAL, FAMILY, AND SOCIAL HISTORY:  Past Medical History  Diagnosis Date  . Hypercholesteremia   . Allergic rhinitis   . GERD (gastroesophageal reflux disease)   .  Type II or unspecified type diabetes mellitus without mention of complication, not stated as uncontrolled   . Essential hypertension, benign   . Menopause   . Osteopenia   . Dependent edema   . Carbuncle, trunk   . Carpal tunnel syndrome   . Cyclical vomiting   . Chronic kidney disease   . Diabetes mellitus type II   . Goiter   . Obesity   . Diabetic  peripheral neuropathy associated with type 2 diabetes mellitus   . Diabetic autonomic neuropathy   . Tachycardia   . Combined hyperlipidemia associated with type 2 diabetes mellitus   . Dyspepsia     Family History  Problem Relation Age of Onset  . COPD Mother   . Hypertension Mother   . Thyroid disease Mother   . Heart disease Father   . Hyperlipidemia Father   . Diabetes Father   . Heart disease Sister   . Cancer Maternal Grandfather   . Diabetes Paternal Grandmother     Current outpatient prescriptions:aspirin 81 MG chewable tablet, Chew 81 mg by mouth daily.  , Disp: , Rfl: ;  atenolol (TENORMIN) 100 MG tablet, Take 100 mg by mouth daily.  , Disp: , Rfl: ;  atorvastatin (LIPITOR) 40 MG tablet, TAKE 1 TABLET BY MOUTH DAILY, Disp: 30 tablet, Rfl: 5;  Cholecalciferol (VITAMIN D) 2000 UNITS CAPS, Take by mouth.  , Disp: , Rfl: ;  febuxostat (ULORIC) 40 MG tablet, Take 40 mg by mouth., Disp: , Rfl:  glucose blood (ONETOUCH VERIO) test strip, Check blood sugar 10 x daily, Disp: 300 each, Rfl: 3;  Insulin Pen Needle (B-D UF III MINI PEN NEEDLES) 31G X 5 MM MISC, Use twice daily with injection, Disp: 100 each, Rfl: 6;  Lancets (ONETOUCH ULTRASOFT) lancets, Use as instructed, Disp: 100 each, Rfl: 12;  Liraglutide (VICTOZA Burke), Inject 0.6 mg into the skin daily.  , Disp: , Rfl:  Multiple Vitamins-Minerals (MULTIVITAMIN WITH MINERALS) tablet, Take 1 tablet by mouth daily.  , Disp: , Rfl: ;  prednisoLONE 5 MG TABS tablet, Take by mouth., Disp: , Rfl: ;  promethazine (PHENERGAN) 25 MG tablet, Take 1 tablet (25 mg total) by mouth every 8 (eight) hours as needed for nausea., Disp: 30 tablet, Rfl: 1;  sodium bicarbonate 650 MG tablet, Take 650 mg by mouth 2 (two) times daily. , Disp: , Rfl:  loratadine (CLARITIN) 10 MG tablet, Take 10 mg by mouth daily., Disp: , Rfl:   Allergies as of 10/06/2014 - Review Complete 10/06/2014  Allergen Reaction Noted  . Fenofibrate  06/03/2011  . Orange fruit [citrus]   05/13/2012  . Ramipril Cough 06/03/2011  . Sulfa antibiotics  04/30/2011    1. Work and Family: The patient retired August 1st 2014.  2. Activities: She is doing some yoga 3 times per week. She is not doing any regular exercise.   3. Smoking, alcohol, or drugs: None 4. Primary Care Provider: Elby Showers, MD 5. Nephrology: Dr. Jamal Maes 6. Urology: Dr. Raynelle Bring 7. GI: Dr. Juanita Craver  REVIEW OF SYSTEMS: There are no other significant problems involving her other body systems.   Objective:  Vital Signs:  BP 124/85  Pulse 124  Wt 210 lb (95.255 kg) Temperature 98.8.   Ht Readings from Last 3 Encounters:  02/17/14 5\' 4"  (1.626 m)  04/20/13 5\' 4"  (1.626 m)  01/15/13 5\' 4"  (1.626 m)   Wt Readings from Last 3 Encounters:  10/06/14 210 lb (95.255 kg)  08/19/14  221 lb (100.245 kg)  04/26/14 219 lb (99.338 kg)   There is no height on file to calculate BSA.  PHYSICAL EXAM:  Constitutional: The patient appears healthy, but obese. She is alert and bright. She has lost an additional 9 pounds since last visit.  Face: The face appears normal.  Eyes: There is no obvious arcus or proptosis, but her eyes are relatively prominent. Moisture appears normal. Mouth: The oropharynx and tongue appear normal. Oral moisture is normal. Neck: The neck appears to be visibly normal. No carotid bruits are noted. The thyroid gland is low-lying. The thyroid gland is normal again at about 20 grams in size. Both lobes are within normal limits for size. The consistency of the lobes is normal. The thyroid gland is uncomfortable to palpation on the right side today.  Lungs: The lungs are clear to auscultation. Air movement is good. Heart: Heart rate and rhythm are regular. Heart sounds S1 and S2 are normal. I did not appreciate any pathologic cardiac murmurs. Abdomen: The abdomen is enlarged in size. Bowel sounds are normal. There is no obvious hepatomegaly, splenomegaly, or other mass effect. There  was no tenderness to deep palpation.  Arms: Muscle size and bulk are normal for age.  Hands: There is no obvious tremor. Phalangeal and metacarpophalangeal joints are normal. Palmar muscles are normal. Palmar skin is normal. Palmar moisture is also normal. Legs: Muscles appear normal for age. She has only trace edema. Feet: Feet are normally formed. Dorsalis pedal pulses are normal 1+ bilaterally. Her tinea pedis is inactive. She has 2+ calluses. Neurologic: Strength is normal for age in both the upper and lower extremities. Muscle tone is normal. Sensation to touch is normal in both legs, but is slightly decreased in the right heel.   Skin: Her face was flushed when she first came into the exam room. The skin of her neck was cool and clammy.  LAB DATA:Labs: HbA1c is 6.0% today, compared with 6.6% at last visit and with 5.7% at the visit prior.  Labs 08/18/14: Cholesterol 230, triglycerides 533, HDL 36; liver panel normal; CMP creatinine 1.82, decreased from 1.98 in February 2015; HbA1c 6.4%  Labs 02/14/14: HbA1c 6.3%; creatinine 2.12,   Labs 08/09/13: TSH 1.212, free T4 1.03, free T3 2.7; microalbumin/creatinine ratio 241.8; cholesterol 201, triglycerides 462, HDL 35, LDL not calculated; CO2 17, creatinine 1.99, calcium 9.6  Labs 01/15/13: Urine microalbumin 7.20 (0-1.89), hemoglobin 15.2, hematocrit 44.2; BUN 35, creatinine 1.99; cholesterol 201, triglycerides 482, HDL 33; TSH 0.970; 25-hydroxy vitamin D 45; HbA1c 6.5%  Labs: 06/30/12: Cholesterol 235, triglycerides 391, HDL 32, LDL 121  Labs: 01/06/12: Cholesterol 218, triglycerides 351, HDL 34, and LDL 114 (increased from 99).  TSH 1.608, free T4 1.20, potassium 5.8, creatinine 2.66, vitamin D 46   Assessment and Plan:   ASSESSMENT:  1. Type 2 diabetes mellitus: Patient's HbA1c is much better today, despite having to remain on intermittent doses of prednisone. She is doing her part in terms of eating right and exercising to try to keep her  BGs under control. She continues to tolerate Victoza quite well.  2. Hypoglycemia: None 3. Peripheral neuropathy: Her peripheral neuropathy is better today, paralleling her improvement in BGs. . 4. Autonomic neuropathy with inappropriate sinus tachycardia: Her heart rate is higher today, in part due to her illness. With improvement in blood glucose control over time, the neuropathy and tachycardia will improve, but there is a lag time of 6-9 months before we see the improvement. 5. Hyperlipidemia:  The patient is under treatment with Lipitor. Her cholesterol was lower, but her triglycerides were higher in August, in part due to her prednisone usage. She needs tighter diet now and more exercise once her gout is under control.  6. Goiter/thyroiditis: Her thyroid gland is again within normal limits for size today. The patient was euthyroid in August 2014. The waxing and waning of her thyroid gland size suggest that the patient has intermittent Hashimoto's disease activity. Her thyroiditis is clinically active today.  7. Chronic renal insufficiency: Her renal insufficiency has improved.  8. Hypertension: Dr. Lorrene Reid is managing her hypertension. Exercise and weight loss would be beneficial for this problem as well. 9. Dyspepsia/GERD: She is doing better since resuming sodium bicarbonate.  10. Gout: She is under very active treatment now. Unfortunately, taking prednisone this long may have an adverse effect on her hypothalamic-pituitary-thyroidal axis. She will need an ACTH stimulation test in the week after she stops prednisone treatment 11. Obesity: Her weight has decreased very nicely. 12. Pedal edema: Her edema is only trace today.  13. UTI: Dr. Lorrene Reid will manage this problem.  PLAN: 1. Diagnostic: Repeat urinary microalbumin/creatinine ratio, C-peptide, and TFTs in 3 months before next visit 2. Therapeutic:  If she can reduce her weight, we will not need to increase her meds for hyperlipidemia and  hypertension. Continue Victoza dose of 0.6 mg plus 4 clicks. Check BGs at meals and eat accordingly.  3. Patient education: We again discussed the importance of eating right and exercising right for control of her blood pressure, her weight, and her diabetes. She is to call me immediately when the decision is made to stop her prednisone.  4. Follow-up: 3 months  Level of Service: This visit lasted in excess of 50 minutes. More than 50% of the visit was devoted to counseling.  Sherrlyn Hock

## 2014-10-06 NOTE — Patient Instructions (Signed)
Follow up visit in 3 months. Please have lab tests done about one week prior to next visit. Call Dr.Camrie Stock immediately when the decision is made to stop prednisone.

## 2014-10-12 ENCOUNTER — Ambulatory Visit (INDEPENDENT_AMBULATORY_CARE_PROVIDER_SITE_OTHER): Payer: BC Managed Care – PPO | Admitting: Internal Medicine

## 2014-10-12 DIAGNOSIS — Z23 Encounter for immunization: Secondary | ICD-10-CM

## 2014-10-17 ENCOUNTER — Other Ambulatory Visit: Payer: Self-pay | Admitting: *Deleted

## 2014-10-17 DIAGNOSIS — E1110 Type 2 diabetes mellitus with ketoacidosis without coma: Secondary | ICD-10-CM

## 2014-10-17 MED ORDER — GLUCOSE BLOOD VI STRP: ORAL_STRIP | Status: DC

## 2014-10-18 ENCOUNTER — Other Ambulatory Visit: Payer: Self-pay

## 2014-10-18 DIAGNOSIS — Z1239 Encounter for other screening for malignant neoplasm of breast: Secondary | ICD-10-CM

## 2014-10-26 ENCOUNTER — Other Ambulatory Visit: Payer: Self-pay | Admitting: *Deleted

## 2014-10-26 DIAGNOSIS — E1110 Type 2 diabetes mellitus with ketoacidosis without coma: Secondary | ICD-10-CM

## 2014-10-26 MED ORDER — GLUCOSE BLOOD VI STRP: ORAL_STRIP | Status: DC

## 2014-11-07 ENCOUNTER — Other Ambulatory Visit: Payer: Self-pay | Admitting: Gynecology

## 2014-11-08 LAB — CYTOLOGY - PAP

## 2014-11-10 ENCOUNTER — Telehealth: Payer: Self-pay

## 2014-11-10 DIAGNOSIS — E2839 Other primary ovarian failure: Secondary | ICD-10-CM

## 2014-11-10 NOTE — Telephone Encounter (Signed)
Patient aware Dr Renold Genta is fine with her going to see Dr Chalmers Cater.  Also order for bone density has been placed.  Patient thinks her last one was 3 years ago.

## 2014-11-11 ENCOUNTER — Other Ambulatory Visit: Payer: Self-pay | Admitting: Internal Medicine

## 2014-11-11 DIAGNOSIS — E039 Hypothyroidism, unspecified: Secondary | ICD-10-CM

## 2014-11-11 NOTE — Progress Notes (Signed)
Patient referred to Dr Chalmers Cater per patient request.  Referral faxed to facility.

## 2014-11-14 ENCOUNTER — Telehealth: Payer: Self-pay | Admitting: Gastroenterology

## 2014-11-14 NOTE — Telephone Encounter (Signed)
Pt has transfered care to another Gi locally.

## 2014-11-18 ENCOUNTER — Other Ambulatory Visit: Payer: BC Managed Care – PPO | Admitting: Internal Medicine

## 2014-11-18 DIAGNOSIS — E785 Hyperlipidemia, unspecified: Secondary | ICD-10-CM

## 2014-11-19 LAB — LIPID PANEL
Cholesterol: 182 mg/dL (ref 0–200)
HDL: 38 mg/dL — ABNORMAL LOW (ref >39)
LDL Cholesterol: 88 mg/dL (ref 0–99)
Total CHOL/HDL Ratio: 4.8 Ratio
Triglycerides: 282 mg/dL — ABNORMAL HIGH (ref <150)
VLDL: 56 mg/dL — ABNORMAL HIGH (ref 0–40)

## 2014-11-21 ENCOUNTER — Telehealth: Payer: Self-pay

## 2014-11-21 NOTE — Telephone Encounter (Signed)
Patient aware of lipid results.

## 2015-01-09 ENCOUNTER — Other Ambulatory Visit: Payer: Self-pay | Admitting: Internal Medicine

## 2015-01-10 ENCOUNTER — Ambulatory Visit: Payer: BC Managed Care – PPO | Admitting: "Endocrinology

## 2015-01-12 ENCOUNTER — Encounter: Payer: Self-pay | Admitting: Internal Medicine

## 2015-01-12 ENCOUNTER — Ambulatory Visit
Admission: RE | Admit: 2015-01-12 | Discharge: 2015-01-12 | Disposition: A | Discharge status: Home / Self Care | Payer: BC Managed Care – PPO | Source: Ambulatory Visit

## 2015-01-12 ENCOUNTER — Ambulatory Visit
Admission: RE | Admit: 2015-01-12 | Discharge: 2015-01-12 | Disposition: A | Discharge status: Home / Self Care | Payer: BC Managed Care – PPO | Source: Ambulatory Visit | Attending: Internal Medicine | Admitting: Internal Medicine

## 2015-01-12 ENCOUNTER — Ambulatory Visit (INDEPENDENT_AMBULATORY_CARE_PROVIDER_SITE_OTHER): Payer: BC Managed Care – PPO | Admitting: Internal Medicine

## 2015-01-12 VITALS — BP 132/74 | HR 98 | Temp 98.0°F | Wt 208.0 lb

## 2015-01-12 DIAGNOSIS — N184 Chronic kidney disease, stage 4 (severe): Secondary | ICD-10-CM

## 2015-01-12 DIAGNOSIS — E2839 Other primary ovarian failure: Secondary | ICD-10-CM

## 2015-01-12 DIAGNOSIS — H6502 Acute serous otitis media, left ear: Secondary | ICD-10-CM

## 2015-01-12 DIAGNOSIS — H109 Unspecified conjunctivitis: Secondary | ICD-10-CM

## 2015-01-12 DIAGNOSIS — Z1239 Encounter for other screening for malignant neoplasm of breast: Secondary | ICD-10-CM

## 2015-01-12 DIAGNOSIS — J069 Acute upper respiratory infection, unspecified: Secondary | ICD-10-CM

## 2015-01-12 MED ORDER — OFLOXACIN 0.3 % OP SOLN: OPHTHALMIC | Status: DC

## 2015-01-12 MED ORDER — AZITHROMYCIN 250 MG PO TABS: ORAL_TABLET | ORAL | Status: DC

## 2015-01-12 NOTE — Patient Instructions (Signed)
Take Zithromax Z-PAK as directed and use Ocuflox ophthalmic drops 1 drop in each eye 4 times a day for 3-5 days

## 2015-01-12 NOTE — Progress Notes (Signed)
   Subjective:    Patient ID: [name], female    DOB: [birth year], 64 y.o.   MRN: [id number]  HPI  Had drainage left ear recently without pain. Has noticed both eyes are red. No drainage from eyes. Feels she has another sinus infection. Stuffy nose. Has felt a bit dizzy at times. Symptoms going on for several days. No discolored drainage from nostrils. No fever or shaking chills. History of chronic kidney disease stage IV.  Review of Systems     Objective:   Physical Exam  Left TM is full but not red. Right TM is clear. Pharynx is clear. Bilateral inflamed conjunctivae. Neck is supple. Chest clear to auscultation without rales or wheezing      Assessment & Plan:  Left serous otitis media-no evidence of otitis externa  Acute URI  Chronic kidney disease stage IV-continues to be followed by nephrologist  History of gout  Bilateral conjunctivitis  Plan: Ocuflox ophthalmic drops to use 1 drop in each eye 4 times a day for 3-5 days. Zithromax Z-PAK take as directed

## 2015-02-23 ENCOUNTER — Other Ambulatory Visit: Payer: BC Managed Care – PPO | Admitting: Internal Medicine

## 2015-02-23 ENCOUNTER — Other Ambulatory Visit: Payer: Self-pay | Admitting: Internal Medicine

## 2015-02-23 DIAGNOSIS — Z0000 Encounter for general adult medical examination without abnormal findings: Secondary | ICD-10-CM

## 2015-02-23 DIAGNOSIS — Z130 Encounter for screening for diseases of the blood and blood-forming organs and certain disorders involving the immune mechanism: Secondary | ICD-10-CM

## 2015-02-23 DIAGNOSIS — Z13220 Encounter for screening for lipoid disorders: Secondary | ICD-10-CM

## 2015-02-23 DIAGNOSIS — Z1329 Encounter for screening for other suspected endocrine disorder: Secondary | ICD-10-CM

## 2015-02-23 DIAGNOSIS — Z1321 Encounter for screening for nutritional disorder: Secondary | ICD-10-CM

## 2015-02-23 LAB — CBC WITH DIFFERENTIAL/PLATELET
Basophils Absolute: 0.1 10*3/uL (ref 0.0–0.1)
Basophils Relative: 1 % (ref 0–1)
Eosinophils Absolute: 0.2 10*3/uL (ref 0.0–0.7)
Eosinophils Relative: 3 % (ref 0–5)
HCT: 45.5 % (ref 36.0–46.0)
Hemoglobin: 15.3 g/dL — ABNORMAL HIGH (ref 12.0–15.0)
Lymphocytes Relative: 25 % (ref 12–46)
Lymphs Abs: 2.1 10*3/uL (ref 0.7–4.0)
MCH: 31.5 pg (ref 26.0–34.0)
MCHC: 33.6 g/dL (ref 30.0–36.0)
MCV: 93.8 fL (ref 78.0–100.0)
MPV: 8.8 fL (ref 8.6–12.4)
Monocytes Absolute: 0.8 10*3/uL (ref 0.1–1.0)
Monocytes Relative: 10 % (ref 3–12)
Neutro Abs: 5.1 10*3/uL (ref 1.7–7.7)
Neutrophils Relative %: 61 % (ref 43–77)
Platelets: 300 10*3/uL (ref 150–400)
RBC: 4.85 MIL/uL (ref 3.87–5.11)
RDW: 14.4 % (ref 11.5–15.5)
WBC: 8.3 10*3/uL (ref 4.0–10.5)

## 2015-02-23 LAB — COMPREHENSIVE METABOLIC PANEL
ALT: 21 U/L (ref 0–35)
AST: 21 U/L (ref 0–37)
Albumin: 3.8 g/dL (ref 3.5–5.2)
Alkaline Phosphatase: 82 U/L (ref 39–117)
BUN: 37 mg/dL — ABNORMAL HIGH (ref 6–23)
CO2: 24 mEq/L (ref 19–32)
Calcium: 9.7 mg/dL (ref 8.4–10.5)
Chloride: 109 mEq/L (ref 96–112)
Creat: 1.93 mg/dL — ABNORMAL HIGH (ref 0.50–1.10)
Glucose, Bld: 114 mg/dL — ABNORMAL HIGH (ref 70–99)
Potassium: 5.3 mEq/L (ref 3.5–5.3)
Sodium: 143 mEq/L (ref 135–145)
Total Bilirubin: 0.5 mg/dL (ref 0.2–1.2)
Total Protein: 6.2 g/dL (ref 6.0–8.3)

## 2015-02-23 LAB — LIPID PANEL
Cholesterol: 216 mg/dL — ABNORMAL HIGH (ref 0–200)
HDL: 35 mg/dL — ABNORMAL LOW (ref >=46)
Total CHOL/HDL Ratio: 6.2 Ratio
Triglycerides: 407 mg/dL — ABNORMAL HIGH (ref <150)

## 2015-02-23 LAB — TSH: TSH: 2.852 u[IU]/mL (ref 0.350–4.500)

## 2015-02-24 ENCOUNTER — Encounter: Payer: Self-pay | Admitting: Internal Medicine

## 2015-02-24 ENCOUNTER — Telehealth: Payer: Self-pay | Admitting: *Deleted

## 2015-02-24 ENCOUNTER — Ambulatory Visit (INDEPENDENT_AMBULATORY_CARE_PROVIDER_SITE_OTHER): Payer: BC Managed Care – PPO | Admitting: Internal Medicine

## 2015-02-24 VITALS — BP 138/80 | HR 80 | Temp 98.0°F | Wt 209.0 lb

## 2015-02-24 DIAGNOSIS — M8580 Other specified disorders of bone density and structure, unspecified site: Secondary | ICD-10-CM

## 2015-02-24 DIAGNOSIS — J309 Allergic rhinitis, unspecified: Secondary | ICD-10-CM

## 2015-02-24 DIAGNOSIS — E119 Type 2 diabetes mellitus without complications: Secondary | ICD-10-CM

## 2015-02-24 DIAGNOSIS — E559 Vitamin D deficiency, unspecified: Secondary | ICD-10-CM

## 2015-02-24 DIAGNOSIS — Z8739 Personal history of other diseases of the musculoskeletal system and connective tissue: Secondary | ICD-10-CM

## 2015-02-24 DIAGNOSIS — E8881 Metabolic syndrome: Secondary | ICD-10-CM

## 2015-02-24 DIAGNOSIS — E669 Obesity, unspecified: Secondary | ICD-10-CM

## 2015-02-24 DIAGNOSIS — E781 Pure hyperglyceridemia: Secondary | ICD-10-CM

## 2015-02-24 DIAGNOSIS — K219 Gastro-esophageal reflux disease without esophagitis: Secondary | ICD-10-CM

## 2015-02-24 DIAGNOSIS — Z0000 Encounter for general adult medical examination without abnormal findings: Secondary | ICD-10-CM

## 2015-02-24 DIAGNOSIS — Z8639 Personal history of other endocrine, nutritional and metabolic disease: Secondary | ICD-10-CM

## 2015-02-24 DIAGNOSIS — N184 Chronic kidney disease, stage 4 (severe): Secondary | ICD-10-CM

## 2015-02-24 LAB — VITAMIN D 25 HYDROXY (VIT D DEFICIENCY, FRACTURES): Vit D, 25-Hydroxy: 29 ng/mL — ABNORMAL LOW (ref 30–100)

## 2015-02-24 LAB — POCT URINALYSIS DIPSTICK
Bilirubin, UA: NEGATIVE
Blood, UA: NEGATIVE
Glucose, UA: NEGATIVE
Ketones, UA: NEGATIVE
Leukocytes, UA: NEGATIVE
Nitrite, UA: NEGATIVE
Spec Grav, UA: 1.01
Urobilinogen, UA: NEGATIVE
pH, UA: 7

## 2015-02-24 LAB — HEMOGLOBIN A1C
Hgb A1c MFr Bld: 6.2 % — ABNORMAL HIGH (ref <5.7)
Mean Plasma Glucose: 131 mg/dL — ABNORMAL HIGH (ref <117)

## 2015-02-24 NOTE — Patient Instructions (Signed)
Please ask Dr. Lorrene Reid regarding amount of vitamin D to take on a daily basis with history of vitamin D deficiency. Please call Dr. Chalmers Cater about elevated triglycerides. Return in one year or as needed. Medical issues being handled by endocrinologist and nephrologist as well as rheumatologist.

## 2015-02-24 NOTE — Progress Notes (Signed)
Subjective:    Patient ID: [name], female    DOB: [birth year], 64 y.o.   MRN: [id number]  HPI 64 year old White Female in today for health maintenance exam and evaluation of medical issues. She has a complex medical history. She has a history of chronic kidney disease stage IV, hypertension, hyperlipidemia, diabetes mellitus, metabolic syndrome, obesity, gout.  In 03-25-06 she had right-sided flank pain and was thought to have nephrolithiasis. 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 Creatinine was mildly elevated at 1.5. However January 2008 she was admitted with dehydration and renal insufficiency. Creatinine was initially 1.6 but improved with hydration to 1.39. She was on antibiotics for a wound infection and had nausea and vomiting.  In July 2009 she was admitted with acute renal failure with creatinine up to 6.0. She had a left ureteral stent placed. Renogram showed poor function and poor clearance of the tracer from the left kidney as well as increase in retention of the right kidney suggesting partial obstruction. Retrograde pyelogram showed no evidence of obstruction. It was never exactly clear what caused acute renal failure. It was thought to be perhaps an acute on chronic injury. There was possible contribution from obstruction, R, metformin.  Patient has history of significant hypertriglyceridemia. Unable to tolerate TriCor because she apparently developed a fever as a side effect and this was discontinued in the summer of 2012.  We have been trying to manage hyperlipidemia with Lipitor and fish oil.  She has a history of large right lower of the hernia repair by Dr. Theda Sers at [facility] in 03/25/2009. Subsequently underwent an incisional ventral hernia repair with mesh December 2011 by Dr. Dossie Der. Baseline creatinine was 2.24 that point. She has been on a kidney transplant  list in the past but has been removed from the list because of recent improvement.  She is allergic to sulfa, TriCor causes of fever, says Altace has caused a cough.  A ganglion cyst removed from right wrist 1959, cesarean section 1983, D&C 1998.  Additional medical problems include GE reflux, allergic rhinitis, dependent edema, osteopenia, history of carpal tunnel syndrome in 25-Mar-2005.  Social history: She is married with one adult daughter. Does not smoke. Occasional wine consumption. She is now retired.  She has been seeing Dr. Chalmers Cater, endocrinologist. Says she has been taken off Victoza recently. She has been seeing dietitian, Tonie Griffith. Her weight in February 2015 was 210 pounds. Now weighs 209 pounds.  Her total cholesterol is 216 and previously was 182. Triglycerides were previously 282 and  now 407. LDL cholesterol was previously 88 and now cannot be measured due to high triglycerides. She denies eating a fatty meal recently. Only change is being off Victoza.  Her triglycerides have increased significantly since last measurement.  Family history: Father with history of aortic valve replacement, hyperlipidemia, rheumatic heart disease. Mother with history of hypertension, hypothyroidism, hyperlipidemia, COPD. One sister died in 03-25-2004 with coronary artery disease and hyperlipidemia. One sister living with hyperlipidemia.    Review of Systems  Constitutional: Positive for fatigue.  Respiratory: Negative.   Cardiovascular: Negative.   Genitourinary: Negative.   Neurological: Negative.   Hematological: Negative.   Psychiatric/Behavioral: Negative.          Objective:   Physical Exam  Constitutional: She is oriented to person, place, and time. She appears well-developed and well-nourished. No distress.  HENT:  Head: Normocephalic  and atraumatic.  Right Ear: External ear normal.  Left Ear: External ear normal.  Mouth/Throat: Oropharynx is clear and moist. No oropharyngeal  exudate.  Eyes: Conjunctivae and EOM are normal. Pupils are equal, round, and reactive to light. Right eye exhibits no discharge. Left eye exhibits no discharge.  Neck: Neck supple. No JVD present. No thyromegaly present.  Cardiovascular: Normal rate, normal heart sounds and intact distal pulses.   No murmur heard. Pulmonary/Chest: Effort normal and breath sounds normal. No respiratory distress. She has no wheezes. She has no rales. She exhibits no tenderness.  Abdominal: Soft. Bowel sounds are normal. She exhibits no distension and no mass. There is no rebound and no guarding.  Genitourinary:  Deferred. She had GYN exam a Dr. Gertie Fey November 2015. She had a Pap smear -11/07/2014 and result is in Epic  Musculoskeletal: Normal range of motion. She exhibits no edema.  Lymphadenopathy:    She has no cervical adenopathy.  Neurological: She is alert and oriented to person, place, and time. She has normal reflexes. She displays normal reflexes. No cranial nerve deficit. Coordination normal.  Skin: Skin is warm and dry. No rash noted. She is not diaphoretic.  Psychiatric: She has a normal mood and affect. Her behavior is normal. Judgment and thought content normal.  Vitals reviewed.         Assessment & Plan:  Significant hypertriglyceridemia. Unable to tolerate TriCor because of fever. Could not tolerate Lopid either. She will contact endocrinologist regarding management. Doesn't recall eating a fatty meal. Only changes being off Pitocin of recently.  Diabetes mellitus-off thick toes a. Hemoglobin A1c not obtained prior to exam. Will be added and faxed to endocrinologist  Obesity-working with dietitian  Metabolic syndrome  History of gout-stable  Chronic kidney disease stage IV-followed by nephrologist. Patient says baseline creatinine is off proximally 2.1 and it is better than that today. Results sent to nephrologist  GE reflux-treated with PPI  Allergic  rhinitis  Hypertension-stable on treatment  History of dependent edema  History of osteopenia  Plan: She will continue to be followed by endocrinologist and nephrologist on a regular basis. Is to ask nephrologist about vitamin D supplementation. Is to call endocrinologist next week about elevated triglycerides. Hemoglobin A1c is 6.2% and previously was 6% about 4 months ago.

## 2015-02-24 NOTE — Telephone Encounter (Signed)
Reviewed lab results with patient.

## 2015-02-25 LAB — MICROALBUMIN / CREATININE URINE RATIO
Creatinine, Urine: 100.3 mg/dL
Microalb Creat Ratio: 88.7 mg/g — ABNORMAL HIGH (ref 0.0–30.0)
Microalb, Ur: 8.9 mg/dL — ABNORMAL HIGH (ref <2.0)

## 2015-02-27 ENCOUNTER — Ambulatory Visit: Payer: BC Managed Care – PPO | Admitting: "Endocrinology

## 2015-03-14 ENCOUNTER — Ambulatory Visit (INDEPENDENT_AMBULATORY_CARE_PROVIDER_SITE_OTHER): Payer: BC Managed Care – PPO | Admitting: Internal Medicine

## 2015-03-14 ENCOUNTER — Encounter: Payer: Self-pay | Admitting: Internal Medicine

## 2015-03-14 VITALS — BP 126/80 | HR 81 | Temp 98.0°F

## 2015-03-14 DIAGNOSIS — Z23 Encounter for immunization: Secondary | ICD-10-CM

## 2015-03-14 NOTE — Progress Notes (Signed)
Patient presents today for a Prevnar vaccine . Patient VS stable. Patient tolerated injection well.

## 2015-05-19 ENCOUNTER — Encounter: Payer: Self-pay | Admitting: Internal Medicine

## 2015-12-27 ENCOUNTER — Other Ambulatory Visit: Payer: Self-pay

## 2015-12-27 DIAGNOSIS — Z1231 Encounter for screening mammogram for malignant neoplasm of breast: Secondary | ICD-10-CM

## 2016-01-15 ENCOUNTER — Ambulatory Visit: Payer: BC Managed Care – PPO

## 2016-01-22 ENCOUNTER — Ambulatory Visit
Admission: RE | Admit: 2016-01-22 | Discharge: 2016-01-22 | Disposition: A | Discharge status: Home / Self Care | Payer: BC Managed Care – PPO | Source: Ambulatory Visit

## 2016-01-22 DIAGNOSIS — Z1231 Encounter for screening mammogram for malignant neoplasm of breast: Secondary | ICD-10-CM

## 2016-02-27 ENCOUNTER — Encounter: Payer: Self-pay | Admitting: Internal Medicine

## 2016-02-27 ENCOUNTER — Ambulatory Visit (INDEPENDENT_AMBULATORY_CARE_PROVIDER_SITE_OTHER): Payer: BC Managed Care – PPO | Admitting: Internal Medicine

## 2016-02-27 VITALS — BP 114/76 | HR 82 | Temp 97.9°F | Resp 20 | Ht 66.0 in | Wt 214.0 lb

## 2016-02-27 DIAGNOSIS — E785 Hyperlipidemia, unspecified: Secondary | ICD-10-CM

## 2016-02-27 DIAGNOSIS — I10 Essential (primary) hypertension: Secondary | ICD-10-CM

## 2016-02-27 DIAGNOSIS — Z8639 Personal history of other endocrine, nutritional and metabolic disease: Secondary | ICD-10-CM | POA: Diagnosis not present

## 2016-02-27 DIAGNOSIS — N184 Chronic kidney disease, stage 4 (severe): Secondary | ICD-10-CM

## 2016-02-27 DIAGNOSIS — Z0000 Encounter for general adult medical examination without abnormal findings: Secondary | ICD-10-CM

## 2016-02-27 DIAGNOSIS — Z8739 Personal history of other diseases of the musculoskeletal system and connective tissue: Secondary | ICD-10-CM

## 2016-02-27 DIAGNOSIS — O10019 Pre-existing essential hypertension complicating pregnancy, unspecified trimester: Secondary | ICD-10-CM

## 2016-02-27 DIAGNOSIS — E559 Vitamin D deficiency, unspecified: Secondary | ICD-10-CM

## 2016-02-27 DIAGNOSIS — E1122 Type 2 diabetes mellitus with diabetic chronic kidney disease: Secondary | ICD-10-CM | POA: Diagnosis not present

## 2016-02-27 DIAGNOSIS — E669 Obesity, unspecified: Secondary | ICD-10-CM

## 2016-02-27 DIAGNOSIS — E8881 Metabolic syndrome: Secondary | ICD-10-CM | POA: Diagnosis not present

## 2016-02-27 LAB — CBC WITH DIFFERENTIAL/PLATELET
Basophils Absolute: 0.1 10*3/uL (ref 0.0–0.1)
Basophils Relative: 1 % (ref 0–1)
Eosinophils Absolute: 0.3 10*3/uL (ref 0.0–0.7)
Eosinophils Relative: 5 % (ref 0–5)
HCT: 42.1 % (ref 36.0–46.0)
Hemoglobin: 13.8 g/dL (ref 12.0–15.0)
Lymphocytes Relative: 27 % (ref 12–46)
Lymphs Abs: 1.6 10*3/uL (ref 0.7–4.0)
MCH: 30.1 pg (ref 26.0–34.0)
MCHC: 32.8 g/dL (ref 30.0–36.0)
MCV: 91.9 fL (ref 78.0–100.0)
MPV: 9.1 fL (ref 8.6–12.4)
Monocytes Absolute: 0.6 10*3/uL (ref 0.1–1.0)
Monocytes Relative: 10 % (ref 3–12)
Neutro Abs: 3.4 10*3/uL (ref 1.7–7.7)
Neutrophils Relative %: 57 % (ref 43–77)
Platelets: 318 10*3/uL (ref 150–400)
RBC: 4.58 MIL/uL (ref 3.87–5.11)
RDW: 14.2 % (ref 11.5–15.5)
WBC: 5.9 10*3/uL (ref 4.0–10.5)

## 2016-02-27 NOTE — Progress Notes (Signed)
Subjective:    Patient ID: [name], female    DOB: 1951/11/29, 65 y.o.   MRN: [id number]  HPI 65 year old Female in today for health maintenance exam and evaluation of multiple complex medical issues including chronic kidney disease stage IV, followed by Dr. Lorrene Reid, type 2 diabetes mellitus followed by Dr. Chalmers Cater, essential hypertension, metabolic syndrome, hyperlipidemia intolerant of TriCor, history of gout with no recent recurrence, obesity.  Used to get frequent sinus infections but not recently.  History of significant hypertriglyceridemia and unable to tolerate TriCor because she apparently developed a fever as a side effect and this was discontinued in the Summer of 2012.  In 2006-04-08, she had right-sided flank pain and was thought to have nephrolithiasis. 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 Creatinine was mildly elevated at 1.5. However in January 2008 she was admitted with dehydration and renal insufficiency. Creatinine was initially 1.6 but improved with hydration to 1.39. She was on antibiotics for wound infection and had nausea and vomiting.  In July 2009 she was admitted with acute renal failure with creatinine up to 6.0. She had a left ureteral stent placed. Renogram showed poor function and poor clearance of the tracer from the left kidney as well as increase in retention of the right kidney suggesting partial traction. Retrograde pyelogram showed no evidence of obstruction. It was never clear exactly what caused the acute renal failure. It was thought perhaps to be in acute on chronic injury. There was possible contribution from obstruction and metformin.  She has a history of hernia repair by Dr. Theda Sers at [facility] in 08-Apr-2009. She is subsequently underwent an incisional ventral hernia repair with mesh December 2011 by Dr. Dossie Der. Baseline creatinine was 2.24 that  point. She was on the kidney transplant list for a. Of time and is now been placed as inactive because of recent improvement.  She is allergic to sulfa. Says Altase has caused a cough and intolerant of TriCor as it causes of fever.  Additional problems include GE reflux, allergic rhinitis, dependent edema, osteopenia, history of carpal tunnel syndrome in 08-Apr-2005. A ganglion sisters removed from right wrist in April 08, 1958. Cesarean section 1983. D&C 1998.  Social history: She is married has one adult daughter who is a Lawyer and [employer]. Does not smoke. Occasional wine consumption. She is now retired. Formerly worked in the [place].  Her weight in February 2015 was 210 pounds. She says her dry weight is 212 pounds.  At one point endocrinologist had her on Victoza but she is off that now.  Family history: Father with history of aortic valve replacement, hyperlipidemia, rheumatic heart disease. Mother with history of hypertension, hypothyroidism, hyperlipidemia, COPD. One sister died in 2004-04-08 with cardiac artery disease and hyperlipidemia. One sister living with hyperlipidemia.     Review of Systems  Constitutional: Negative.   All other systems reviewed and are negative.      Objective:   Physical Exam  Constitutional: She is oriented to person, place, and time. She appears well-developed and well-nourished. No distress.  HENT:  Head: Normocephalic and atraumatic.  Right Ear: External ear normal.  Left Ear: External ear normal.  Mouth/Throat: Oropharynx is clear and moist. No oropharyngeal exudate.  Eyes: Conjunctivae and EOM are normal. Pupils are equal, round, and reactive to light. Right eye exhibits no discharge. Left eye exhibits no discharge. No scleral icterus.  Neck: Neck supple. No JVD present. No thyromegaly present.  Cardiovascular: Normal rate, normal heart sounds and intact distal pulses.   No murmur heard. Pulmonary/Chest: Effort normal and  breath sounds normal. No respiratory distress. She has no wheezes. She has no rales. She exhibits no tenderness.  Breasts normal female without masses  Abdominal: Soft. Bowel sounds are normal. She exhibits no distension and no mass. There is no tenderness. There is no rebound and no guarding.  Genitourinary:  Pap taken in 2015 by Dr. Gertie Fey.  Bimanual exam normal today  Musculoskeletal: She exhibits no edema.  Lymphadenopathy:    She has no cervical adenopathy.  Neurological: She is alert and oriented to person, place, and time. She has normal reflexes. No cranial nerve deficit. Coordination normal.  Skin: Skin is warm and dry. No rash noted. She is not diaphoretic.  Psychiatric: She has a normal mood and affect. Her behavior is normal. Judgment and thought content normal.  Vitals reviewed.         Assessment & Plan:   Chronic kidney disease stage IV followed every 6 months by Dr. Lorrene Reid. Patient says creatinine is stable at around 2.  Essential hypertension-stable on current regimen  Controlled type 2 diabetes mellitus-hemoglobin A1c done by endocrinologist December 2016 was 7.1%  History of gout-no recent attacks  Metabolic syndrome  Hyperlipidemia-intolerant of TriCor-significant issues with triglycerides  Obesity-needs to diet and exercise. Says dry weight is 212 pounds  History of vitamin D deficiency-this was repeated today  Immunizations are up-to-date  She brings in lab work from Dr. Chalmers Cater done December 2016. Creatinine was 1.9 with an estimated GFR of 28.43. BUN was 46. Calcium was 9.5. Liver functions were normal. Triglycerides were 563, total cholesterol 241, HDL cholesterol 39. CBC and vitamin D were not drawn. These were drawn today. Return in one year or as needed.

## 2016-02-27 NOTE — Patient Instructions (Signed)
Patient continues to be followed by Dr. Lorrene Reid every 6 months and Dr. Soyla Murphy endocrinologist. Return in one year or as needed. Immunizations are up-to-date.

## 2016-02-28 LAB — VITAMIN D 25 HYDROXY (VIT D DEFICIENCY, FRACTURES): Vit D, 25-Hydroxy: 31 ng/mL (ref 30–100)

## 2016-02-28 LAB — MICROALBUMIN, URINE: Microalb, Ur: 30.6 mg/dL

## 2016-06-05 ENCOUNTER — Telehealth: Payer: Self-pay | Admitting: Internal Medicine

## 2016-06-05 DIAGNOSIS — R5383 Other fatigue: Secondary | ICD-10-CM

## 2016-06-05 DIAGNOSIS — R0683 Snoring: Secondary | ICD-10-CM

## 2016-06-05 NOTE — Telephone Encounter (Signed)
She went to [facility] for her yearly check up yesterday.  They are wanting her to have a sleep study done r/t  Her hypertension and her kidney function.  She remains on the kidney transplant list; however, she is on the inactive list.  She will have to have a referral from our office for her insurance.  Therefore, they instructed her to go through our office.  Sleep patterns are waking up frequently, snoring, not aware of any gasping for air at all.  States she's always tired, but she attributes that to being a woman.    [facility] would like for her to have this done if possible.  Advised we will get back to her.  She states this is not urgent.    [phone] and home [phone]

## 2016-06-06 NOTE — Telephone Encounter (Signed)
Please arrange for consult with Pulmonary

## 2016-08-26 ENCOUNTER — Institutional Professional Consult (permissible substitution): Payer: BC Managed Care – PPO | Admitting: Pulmonary Disease

## 2016-08-26 LAB — HM DIABETES EYE EXAM

## 2016-08-27 ENCOUNTER — Encounter: Payer: Self-pay | Admitting: Pulmonary Disease

## 2016-08-27 ENCOUNTER — Ambulatory Visit (INDEPENDENT_AMBULATORY_CARE_PROVIDER_SITE_OTHER): Payer: BC Managed Care – PPO | Admitting: Pulmonary Disease

## 2016-08-27 VITALS — BP 126/82 | HR 76 | Ht 64.0 in | Wt 215.0 lb

## 2016-08-27 DIAGNOSIS — R0683 Snoring: Secondary | ICD-10-CM | POA: Diagnosis not present

## 2016-08-27 NOTE — Progress Notes (Signed)
Past Surgical History She  has a past surgical history that includes Ganglion cyst excision [id number]); Cesarean section (1983); and Dilation and curettage of uterus (1988).  Allergies  Allergen Reactions  . Fenofibrate     fever  . Orange Fruit [Citrus]   . Ramipril Cough  . Sulfa Antibiotics     Family History Her family history includes COPD in her mother; Cancer in her maternal grandfather; Diabetes in her father and paternal grandmother; Heart disease in her father and sister; Hyperlipidemia in her father; Hypertension in her mother; Thyroid disease in her mother.  Social History She  reports that she has never smoked. She has never used smokeless tobacco. She reports that she does not drink alcohol or use drugs.  Review of systems  Constitutional: Negative for fever and unexpected weight change.  HENT: Positive for congestion. Negative for dental problem, ear pain, nosebleeds, postnasal drip, rhinorrhea, sinus pressure, sneezing, sore throat and trouble swallowing.   Eyes: Negative for redness and itching.  Respiratory: Negative for cough, chest tightness, shortness of breath and wheezing.   Cardiovascular: Positive for leg swelling. Negative for palpitations.  Gastrointestinal: Negative for nausea and vomiting.  Genitourinary: Negative for dysuria.  Musculoskeletal: Positive for arthralgias and joint swelling.  Skin: Negative for rash.  Neurological: Negative for headaches.  Hematological: Does not bruise/bleed easily.  Psychiatric/Behavioral: Negative for dysphoric mood. The patient is not nervous/anxious.     Current Outpatient Prescriptions on File Prior to Visit  Medication Sig  . aspirin 81 MG chewable tablet Chew 81 mg by mouth daily.    Marland Kitchen atenolol (TENORMIN) 100 MG tablet Take 100 mg by mouth daily.    Marland Kitchen atorvastatin (LIPITOR) 40 MG tablet TAKE 1 TABLET BY MOUTH ONCE DAILY  . Cholecalciferol (VITAMIN D) 2000 UNITS CAPS Take by mouth.    . febuxostat (ULORIC) 40 MG  tablet Take 40 mg by mouth.  Marland Kitchen glucose blood (ONETOUCH VERIO) test strip Check blood sugar 3 x daily  . Lancets (ONETOUCH ULTRASOFT) lancets Use as instructed  . loratadine (CLARITIN) 10 MG tablet Take 10 mg by mouth daily as needed.   . Multiple Vitamin (MULTIVITAMIN) capsule Take 1 capsule by mouth daily.  . Omega-3 Fatty Acids (FISH OIL) 1000 MG CAPS Take 3 capsules by mouth.  . promethazine (PHENERGAN) 25 MG tablet Take 1 tablet (25 mg total) by mouth every 8 (eight) hours as needed for nausea.  . sodium bicarbonate 650 MG tablet Take 650 mg by mouth 2 (two) times daily.    No current facility-administered medications on file prior to visit.     Chief Complaint  Patient presents with  . Sleep Consult    Referred by Dr Renold Genta for snoring. Epworth Score: 16    Tests:  Past medical history She  has a past medical history of Allergic rhinitis; Carbuncle, trunk; Carpal tunnel syndrome; Chronic kidney disease; Combined hyperlipidemia associated with type 2 diabetes mellitus ([place]); Cyclical vomiting; Dependent edema; Diabetes mellitus type II; Diabetic autonomic neuropathy ([place]); Diabetic peripheral neuropathy associated with type 2 diabetes mellitus ([place]); Dyspepsia; Essential hypertension, benign; GERD (gastroesophageal reflux disease); Goiter; Hypercholesteremia; Menopause; Obesity; Osteopenia; Tachycardia; and Type II or unspecified type diabetes mellitus without mention of complication, not stated as uncontrolled.  Vital signs BP 126/82 (BP Location: Right Arm, Cuff Size: Normal)   Pulse 76   Ht 5\' 4"  (1.626 m)   Wt 215 lb (97.5 kg)   SpO2 97%   BMI 36.90 kg/m   History of Present Illness  [name] is a 65 y.o. female for evaluation of sleep problems.  Her husband has been concerned about her breathing while asleep.  She snores, and will stop breathing.  He was recently dx with OSA, and feels she has this also.  She will wake up feel like her throat is closing, and has  trouble sleeping on her back.  She feels tired all the time, and falls asleep easily if she is sitting quiet.  She goes to sleep between 11 pm and 1 am.  She falls asleep quickly.  She wakes up at least 3 times to use the bathroom.  She gets out of bed between 730 and 9 am.  She feels tired in the morning.  She denies morning headache.  She does not use anything to help her fall sleep or stay awake.  She denies sleep walking, sleep talking, bruxism, or nightmares.  There is no history of restless legs.  She denies sleep hallucinations, sleep paralysis, or cataplexy.  The Epworth score is 16 out of 24.   Physical Exam:  General - No distress ENT - No sinus tenderness, no oral exudate, no LAN, no thyromegaly, TM clear, pupils equal/reactive, MP 4, scalloped tongue, click in TMJ Cardiac - s1s2 regular, no murmur, pulses symmetric Chest - No wheeze/rales/dullness, good air entry, normal respiratory excursion Back - No focal tenderness Abd - Soft, non-tender, no organomegaly, + bowel sounds Ext - No edema Neuro - Normal strength, cranial nerves intact Skin - No rashes Psych - Normal mood, and behavior  Discussion: She has snoring, sleep disruption, witnessed apnea, and daytime sleepiness.  She has hx of HTN and DM.  I am concerned she could have sleep apnea.  Her BMI is > 35.  We discussed how sleep apnea can affect various health problems, including risks for hypertension, cardiovascular disease, and diabetes.  We also discussed how sleep disruption can increase risks for accidents, such as while driving.  Weight loss as a means of improving sleep apnea was also reviewed.  Additional treatment options discussed were CPAP therapy, oral appliance, and surgical intervention.  Assessment/plan:  Snoring with concern for obstructive sleep apnea. - will arrange for home sleep study pending insurance approval   Patient Instructions  Will arrange for home sleep study Will call to arrange for  follow up after sleep study reviewed     [name], M.D. Pager [phone] 08/27/2016, 11:31 AM

## 2016-08-27 NOTE — Progress Notes (Signed)
   Subjective:    Patient ID: [name], female    DOB: [birth year], 65 y.o.   MRN: [id number]  HPI    Review of Systems  Constitutional: Negative for fever and unexpected weight change.  HENT: Positive for congestion. Negative for dental problem, ear pain, nosebleeds, postnasal drip, rhinorrhea, sinus pressure, sneezing, sore throat and trouble swallowing.   Eyes: Negative for redness and itching.  Respiratory: Negative for cough, chest tightness, shortness of breath and wheezing.   Cardiovascular: Positive for leg swelling. Negative for palpitations.  Gastrointestinal: Negative for nausea and vomiting.  Genitourinary: Negative for dysuria.  Musculoskeletal: Positive for arthralgias and joint swelling.  Skin: Negative for rash.  Neurological: Negative for headaches.  Hematological: Does not bruise/bleed easily.  Psychiatric/Behavioral: Negative for dysphoric mood. The patient is not nervous/anxious.        Objective:   Physical Exam        Assessment & Plan:

## 2016-08-27 NOTE — Patient Instructions (Signed)
Will arrange for home sleep study Will call to arrange for follow up after sleep study reviewed  

## 2016-09-09 ENCOUNTER — Ambulatory Visit (INDEPENDENT_AMBULATORY_CARE_PROVIDER_SITE_OTHER): Payer: BC Managed Care – PPO | Admitting: Internal Medicine

## 2016-09-09 ENCOUNTER — Encounter: Payer: Self-pay | Admitting: Internal Medicine

## 2016-09-09 VITALS — BP 110/70 | HR 80 | Temp 98.6°F | Ht 64.0 in | Wt 209.0 lb

## 2016-09-09 DIAGNOSIS — J069 Acute upper respiratory infection, unspecified: Secondary | ICD-10-CM

## 2016-09-09 DIAGNOSIS — Z23 Encounter for immunization: Secondary | ICD-10-CM

## 2016-09-09 DIAGNOSIS — H6692 Otitis media, unspecified, left ear: Secondary | ICD-10-CM | POA: Diagnosis not present

## 2016-09-09 MED ORDER — OFLOXACIN 0.3 % OP SOLN: 1.0000 [drp] | Freq: Four times a day (QID) | OPHTHALMIC | 0 refills | Status: DC

## 2016-09-09 MED ORDER — AZITHROMYCIN 250 MG PO TABS: ORAL_TABLET | ORAL | 0 refills | Status: DC

## 2016-09-12 DIAGNOSIS — G4733 Obstructive sleep apnea (adult) (pediatric): Secondary | ICD-10-CM | POA: Diagnosis not present

## 2016-09-13 ENCOUNTER — Telehealth: Payer: Self-pay

## 2016-09-13 MED ORDER — AZITHROMYCIN 250 MG PO TABS: ORAL_TABLET | ORAL | 0 refills | Status: DC

## 2016-09-13 NOTE — Telephone Encounter (Signed)
Refill sent in

## 2016-09-13 NOTE — Telephone Encounter (Signed)
Pt is calling requesting a refill on the zpak. She said you and her talked about this?

## 2016-09-23 ENCOUNTER — Telehealth: Payer: Self-pay | Admitting: Pulmonary Disease

## 2016-09-23 ENCOUNTER — Other Ambulatory Visit: Payer: Self-pay | Admitting: *Deleted

## 2016-09-23 ENCOUNTER — Encounter: Payer: Self-pay | Admitting: Pulmonary Disease

## 2016-09-23 DIAGNOSIS — G4733 Obstructive sleep apnea (adult) (pediatric): Secondary | ICD-10-CM | POA: Diagnosis not present

## 2016-09-23 DIAGNOSIS — R0683 Snoring: Secondary | ICD-10-CM

## 2016-09-23 HISTORY — DX: Obstructive sleep apnea (adult) (pediatric): G47.33

## 2016-09-23 NOTE — Telephone Encounter (Signed)
HST 09/12/16 >> AHI 30.1, SaO2 low 74%.   Will have my nurse inform pt that sleep study shows severe sleep apnea.  Options are 1) CPAP now, 2) ROV first.  If She is agreeable to CPAP, then please send order for auto CPAP range 5 to 15 cm H2O with heated humidity and mask of choice.  Have download sent 1 month after starting CPAP and set up ROV 2 months after starting CPAP.  ROV can be with me or NP.

## 2016-09-25 NOTE — Progress Notes (Signed)
   Subjective:    Patient ID: [name], female    DOB: July 20, 1951, 65 y.o.   MRN: [id number]  HPI 4 day history of URI symptoms with some intermittent pain in her ears and fullness and maxillary sinus area. No fever or shaking chills.    Review of Systems     Objective:   Physical Exam Left TM is dull and red. Right TM slightly full. Pharynx is clear. Neck supple. Chest clear to auscultation.       Assessment & Plan:  Acute left otitis media  Acute URI  Plan: Zithromax Z-Pak take 2 tablets day one followed by 1 tablet days 2 through 5 with 1 refill. If not better in 7 days have prescription refilled.

## 2016-09-25 NOTE — Patient Instructions (Signed)
Zithromax Z-PAK take 2 tablets day one followed by 1 tablet days 2 through 5 with 1 refill. If not better in 7 days have prescription refilled. Flu vaccine given today.

## 2016-09-27 NOTE — Telephone Encounter (Signed)
Results have been explained to patient, pt expressed understanding. Nothing further needed.  

## 2016-09-30 ENCOUNTER — Ambulatory Visit (INDEPENDENT_AMBULATORY_CARE_PROVIDER_SITE_OTHER): Payer: BC Managed Care – PPO | Admitting: Adult Health

## 2016-09-30 ENCOUNTER — Encounter: Payer: Self-pay | Admitting: Adult Health

## 2016-09-30 VITALS — BP 110/80 | HR 73 | Temp 98.2°F | Ht 64.0 in | Wt 212.0 lb

## 2016-09-30 DIAGNOSIS — G4733 Obstructive sleep apnea (adult) (pediatric): Secondary | ICD-10-CM

## 2016-09-30 NOTE — Progress Notes (Signed)
Subjective:    Patient ID: [name], female    DOB: 02/14/1951, 65 y.o.   MRN: [id number]  HPI 65 year-old female seen for sleep consult on 08/27/2016. She was found to have severe sleep apnea on home sleep study.  TEST  Home sleep study 09/12/2016 showed severe sleep apnea with AHI 30.1, SaO2 low 74%.  09/30/2016 Follow up : OSA  Pt returns for 6 week follow up . Seen for sleep consult 08/27/16 for daytime sleepiness and snoring  . HST showed severe OSA with AHI at 30, SaO2 low 74%. We discussed her test results along with treatment options including weight loss, oral appliance and CPAP  She would like to proceed with nocturnal CPAP .  She denies chest pain, orthopnea or edema.    Past Medical History:  Diagnosis Date  . Allergic rhinitis   . Carbuncle, trunk   . Carpal tunnel syndrome   . Chronic kidney disease   . Combined hyperlipidemia associated with type 2 diabetes mellitus ([place])   . Cyclical vomiting   . Dependent edema   . Diabetes mellitus type II   . Diabetic autonomic neuropathy ([place])   . Diabetic peripheral neuropathy associated with type 2 diabetes mellitus ([place])   . Dyspepsia   . Essential hypertension, benign   . GERD (gastroesophageal reflux disease)   . Goiter   . Hypercholesteremia   . Menopause   . Obesity   . OSA (obstructive sleep apnea) 09/23/2016  . Osteopenia   . Tachycardia   . Type II or unspecified type diabetes mellitus without mention of complication, not stated as uncontrolled    Current Outpatient Prescriptions on File Prior to Visit  Medication Sig Dispense Refill  . aspirin 81 MG chewable tablet Chew 81 mg by mouth daily.      Marland Kitchen atenolol (TENORMIN) 100 MG tablet Take 100 mg by mouth daily.      Marland Kitchen atorvastatin (LIPITOR) 40 MG tablet TAKE 1 TABLET BY MOUTH ONCE DAILY 30 tablet 11  . Cholecalciferol (VITAMIN D) 2000 UNITS CAPS Take by mouth.      . febuxostat (ULORIC) 40 MG tablet Take 40 mg by mouth.    Marland Kitchen glucose blood (ONETOUCH  VERIO) test strip Check blood sugar 3 x daily 100 each 6  . Insulin NPH Human, Isophane, (NOVOLIN N [place]) Inject 10 mLs into the skin daily.     . Lancets (ONETOUCH ULTRASOFT) lancets Use as instructed 100 each 12  . loratadine (CLARITIN) 10 MG tablet Take 10 mg by mouth daily as needed.     . Multiple Vitamin (MULTIVITAMIN) capsule Take 1 capsule by mouth daily.    . Omega-3 Fatty Acids (FISH OIL) 1000 MG CAPS Take 3 capsules by mouth.    . promethazine (PHENERGAN) 25 MG tablet Take 1 tablet (25 mg total) by mouth every 8 (eight) hours as needed for nausea. 30 tablet 1  . sodium bicarbonate 650 MG tablet Take 650 mg by mouth 2 (two) times daily.      No current facility-administered medications on file prior to visit.      Review of Systems Constitutional:   No  weight loss, night sweats,  Fevers, chills, fatigue, or  lassitude.  HEENT:   No headaches,  Difficulty swallowing,  Tooth/dental problems, or  Sore throat,                No sneezing, itching, ear ache, nasal congestion, post nasal drip,   CV:  No chest  pain,  Orthopnea, PND, swelling in lower extremities, anasarca, dizziness, palpitations, syncope.   GI  No heartburn, indigestion, abdominal pain, nausea, vomiting, diarrhea, change in bowel habits, loss of appetite, bloody stools.   Resp: No shortness of breath with exertion or at rest.  No excess mucus, no productive cough,  No non-productive cough,  No coughing up of blood.  No change in color of mucus.  No wheezing.  No chest wall deformity  Skin: no rash or lesions.  GU: no dysuria, change in color of urine, no urgency or frequency.  No flank pain, no hematuria   MS:  No joint pain or swelling.  No decreased range of motion.  No back pain.  Psych:  No change in mood or affect. No depression or anxiety.  No memory loss.         Objective:   Physical Exam Vitals:   09/30/16 1534  BP: 110/80  Pulse: 73  Temp: 98.2 F (36.8 C)  TempSrc: Oral  SpO2: 98%  Weight:  212 lb (96.2 kg)  Height: 5\' 4"  (1.626 m)  Body mass index is 36.39 kg/m.   GEN: A/Ox3; pleasant , NAD, well nourished    HEENT:  [place]/AT,  EACs-clear, TMs-wnl, NOSE-clear, THROAT-clear, no lesions, no postnasal drip or exudate noted. Class 2 MP airway   NECK:  Supple w/ fair ROM; no JVD; normal carotid impulses w/o bruits; no thyromegaly or nodules palpated; no lymphadenopathy.    RESP  Clear  P & A; w/o, wheezes/ rales/ or rhonchi. no accessory muscle use, no dullness to percussion  CARD:  RRR, no m/r/g  , no peripheral edema, pulses intact, no cyanosis or clubbing.  GI:   Soft & nt; nml bowel sounds; no organomegaly or masses detected.   Musco: Warm bil, no deformities or joint swelling noted.   Neuro: alert, no focal deficits noted.    Skin: Warm, no lesions or rashes  Tammy Parrett NP-C  [facility]  09/30/2016         Assessment & Plan:

## 2016-09-30 NOTE — Patient Instructions (Addendum)
Begin CPAP At bedtime  .  Goal is to wear for at least 4-6 hr each night .  Work on weight loss.  Do not drive if sleepy Download in 1 month and As needed  Follow up with Dr. Halford Chessman  Or Quanta Roher NP in 2 months and As needed

## 2016-10-01 NOTE — Assessment & Plan Note (Signed)
Severe OSA on HST ,  Pt education on sleep apnea and CPAP   Plan  Patient Instructions  Begin CPAP At bedtime  .  Goal is to wear for at least 4-6 hr each night .  Work on weight loss.  Do not drive if sleepy Download in 1 month and As needed  Follow up with Dr. Halford Chessman  Or Parrett NP in 2 months and As needed

## 2016-10-01 NOTE — Addendum Note (Signed)
Addended by: Osa Craver on: 10/01/2016 02:36 PM   Modules accepted: Orders

## 2016-11-11 ENCOUNTER — Encounter: Payer: Self-pay | Admitting: Adult Health

## 2016-11-19 ENCOUNTER — Telehealth: Payer: Self-pay | Admitting: Adult Health

## 2016-11-19 NOTE — Telephone Encounter (Signed)
Per TP: Cpap download looks good. Continue on Cpap as directed Spoke with pt and informed of results. Nothing further needed.

## 2016-12-12 ENCOUNTER — Ambulatory Visit (INDEPENDENT_AMBULATORY_CARE_PROVIDER_SITE_OTHER): Payer: BC Managed Care – PPO | Admitting: Adult Health

## 2016-12-12 ENCOUNTER — Encounter: Payer: Self-pay | Admitting: Adult Health

## 2016-12-12 DIAGNOSIS — G4733 Obstructive sleep apnea (adult) (pediatric): Secondary | ICD-10-CM | POA: Diagnosis not present

## 2016-12-12 NOTE — Assessment & Plan Note (Signed)
Severe OSA well controlled on CPAP  Set up ONO on CPAP to make sure nocturnal desats are controlled on CPAP as well  Plan  Patient Instructions  Continue on CPAP At bedtime  .  Goal is to wear for at least 4-6 hr each night .  Work on weight loss.  Do not drive if sleepy Set up for a ONO on CPAP .  Follow up with Dr. Halford Chessman  6 months and As needed

## 2016-12-12 NOTE — Progress Notes (Signed)
Subjective:    Patient ID: [name], female    DOB: [birth year], 65 y.o.   MRN: [id number]  HPI 65 year-old female seen for sleep consult on 08/27/2016. She was found to have severe sleep apnea on home sleep study.  TEST  Home sleep study 09/12/2016 showed severe sleep apnea with AHI 30.1, SaO2 low 74%.  12/12/2016 Follow up : OSA  Pt returns for 2 month follow up . She was recent started on CPAP for severe OSA.  She says she is doing well on CPAP . Feels more rested most days.  Denies machine or mask issues.  Download shows Excellent compliance with average usage at 5.5 hours. AHI 0.8. Minimum leaks. Previous sleep study did show significant desaturations with O2 saturation low at 74%. We discussed setting up an overnight oximetry test on C Pap.  Past Medical History:  Diagnosis Date  . Allergic rhinitis   . Carbuncle, trunk   . Carpal tunnel syndrome   . Chronic kidney disease   . Combined hyperlipidemia associated with type 2 diabetes mellitus ([place])   . Cyclical vomiting   . Dependent edema   . Diabetes mellitus type II   . Diabetic autonomic neuropathy ([place])   . Diabetic peripheral neuropathy associated with type 2 diabetes mellitus ([place])   . Dyspepsia   . Essential hypertension, benign   . GERD (gastroesophageal reflux disease)   . Goiter   . Hypercholesteremia   . Menopause   . Obesity   . OSA (obstructive sleep apnea) 09/23/2016  . Osteopenia   . Tachycardia   . Type II or unspecified type diabetes mellitus without mention of complication, not stated as uncontrolled    Current Outpatient Prescriptions on File Prior to Visit  Medication Sig Dispense Refill  . aspirin 81 MG chewable tablet Chew 81 mg by mouth daily.      Marland Kitchen atenolol (TENORMIN) 100 MG tablet Take 100 mg by mouth daily.      Marland Kitchen atorvastatin (LIPITOR) 40 MG tablet TAKE 1 TABLET BY MOUTH ONCE DAILY 30 tablet 11  . Cholecalciferol (VITAMIN D) 2000 UNITS CAPS Take by mouth.      . febuxostat  (ULORIC) 40 MG tablet Take 40 mg by mouth.    Marland Kitchen glucose blood (ONETOUCH VERIO) test strip Check blood sugar 3 x daily 100 each 6  . Insulin NPH Human, Isophane, (NOVOLIN N [place]) Inject 10 mLs into the skin daily.     . Lancets (ONETOUCH ULTRASOFT) lancets Use as instructed 100 each 12  . loratadine (CLARITIN) 10 MG tablet Take 10 mg by mouth daily as needed.     . Multiple Vitamin (MULTIVITAMIN) capsule Take 1 capsule by mouth daily.    . Omega-3 Fatty Acids (FISH OIL) 1000 MG CAPS Take 3 capsules by mouth.    . promethazine (PHENERGAN) 25 MG tablet Take 1 tablet (25 mg total) by mouth every 8 (eight) hours as needed for nausea. 30 tablet 1  . sodium bicarbonate 650 MG tablet Take 650 mg by mouth 2 (two) times daily.      No current facility-administered medications on file prior to visit.      Review of Systems Constitutional:   No  weight loss, night sweats,  Fevers, chills, fatigue, or  lassitude.  HEENT:   No headaches,  Difficulty swallowing,  Tooth/dental problems, or  Sore throat,                No sneezing, itching, ear ache, nasal  congestion, post nasal drip,   CV:  No chest pain,  Orthopnea, PND, swelling in lower extremities, anasarca, dizziness, palpitations, syncope.   GI  No heartburn, indigestion, abdominal pain, nausea, vomiting, diarrhea, change in bowel habits, loss of appetite, bloody stools.   Resp: No shortness of breath with exertion or at rest.  No excess mucus, no productive cough,  No non-productive cough,  No coughing up of blood.  No change in color of mucus.  No wheezing.  No chest wall deformity  Skin: no rash or lesions.  GU: no dysuria, change in color of urine, no urgency or frequency.  No flank pain, no hematuria   MS:  No joint pain or swelling.  No decreased range of motion.  No back pain.  Psych:  No change in mood or affect. No depression or anxiety.  No memory loss.         Objective:   Physical Exam Vitals:   12/12/16 1410  BP: 120/80    Pulse: 75  Temp: 98.2 F (36.8 C)  TempSrc: Oral  SpO2: 95%  Weight: 215 lb (97.5 kg)  Height: 5\' 4"  (1.626 m)  Body mass index is 36.9 kg/m.  GEN: A/Ox3; pleasant , NAD, well nourished   HEENT:  /AT,  EACs-clear, TMs-wnl, NOSE-clear, THROAT-clear, no lesions, no postnasal drip or exudate noted. Class 2-3 Airway   NECK:  Supple w/ fair ROM; no JVD; normal carotid impulses w/o bruits; no thyromegaly or nodules palpated; no lymphadenopathy.  RESP  Clear  P & A; w/o, wheezes/ rales/ or rhonchi.no accessory muscle use, no dullness to percussion  CARD:  RRR, no m/r/g  , no peripheral edema, pulses intact, no cyanosis or clubbing.  GI:   Soft & nt; nml bowel sounds; no organomegaly or masses detected.  Musco: Warm bil, no deformities or joint swelling noted.   Neuro: alert, no focal deficits noted.    Skin: Warm, no lesions or rashes

## 2016-12-12 NOTE — Patient Instructions (Signed)
Continue on CPAP At bedtime  .  Goal is to wear for at least 4-6 hr each night .  Work on weight loss.  Do not drive if sleepy Set up for a ONO on CPAP .  Follow up with Dr. Halford Chessman  6 months and As needed

## 2016-12-12 NOTE — Progress Notes (Signed)
Reviewed and agree with assessment/plan.  [name], MD [facility] Pulmonary/Critical Care 12/12/2016, 3:07 PM Pager:  [phone]

## 2016-12-19 ENCOUNTER — Other Ambulatory Visit: Payer: Self-pay | Admitting: Internal Medicine

## 2016-12-19 DIAGNOSIS — Z1231 Encounter for screening mammogram for malignant neoplasm of breast: Secondary | ICD-10-CM

## 2017-01-11 ENCOUNTER — Encounter: Payer: Self-pay | Admitting: Adult Health

## 2017-01-22 ENCOUNTER — Ambulatory Visit: Payer: BC Managed Care – PPO

## 2017-01-23 ENCOUNTER — Ambulatory Visit
Admission: RE | Admit: 2017-01-23 | Discharge: 2017-01-23 | Disposition: A | Discharge status: Home / Self Care | Payer: BC Managed Care – PPO | Source: Ambulatory Visit | Attending: Internal Medicine | Admitting: Internal Medicine

## 2017-01-23 DIAGNOSIS — Z1231 Encounter for screening mammogram for malignant neoplasm of breast: Secondary | ICD-10-CM

## 2017-02-28 ENCOUNTER — Encounter: Payer: Self-pay | Admitting: Internal Medicine

## 2017-02-28 ENCOUNTER — Ambulatory Visit (INDEPENDENT_AMBULATORY_CARE_PROVIDER_SITE_OTHER): Payer: BC Managed Care – PPO | Admitting: Internal Medicine

## 2017-02-28 ENCOUNTER — Other Ambulatory Visit (HOSPITAL_COMMUNITY)
Admission: RE | Admit: 2017-02-28 | Discharge: 2017-02-28 | Disposition: A | Discharge status: Home / Self Care | Payer: BC Managed Care – PPO | Source: Ambulatory Visit | Attending: Internal Medicine | Admitting: Internal Medicine

## 2017-02-28 VITALS — BP 126/80 | HR 73 | Temp 98.2°F | Ht 63.25 in | Wt 214.0 lb

## 2017-02-28 DIAGNOSIS — I10 Essential (primary) hypertension: Secondary | ICD-10-CM | POA: Diagnosis not present

## 2017-02-28 DIAGNOSIS — E785 Hyperlipidemia, unspecified: Secondary | ICD-10-CM

## 2017-02-28 DIAGNOSIS — Z0000 Encounter for general adult medical examination without abnormal findings: Secondary | ICD-10-CM

## 2017-02-28 DIAGNOSIS — N184 Chronic kidney disease, stage 4 (severe): Secondary | ICD-10-CM | POA: Diagnosis not present

## 2017-02-28 DIAGNOSIS — Z124 Encounter for screening for malignant neoplasm of cervix: Secondary | ICD-10-CM

## 2017-02-28 DIAGNOSIS — E119 Type 2 diabetes mellitus without complications: Secondary | ICD-10-CM

## 2017-02-28 DIAGNOSIS — Z1321 Encounter for screening for nutritional disorder: Secondary | ICD-10-CM | POA: Diagnosis not present

## 2017-02-28 DIAGNOSIS — R310 Gross hematuria: Secondary | ICD-10-CM | POA: Diagnosis not present

## 2017-02-28 DIAGNOSIS — Z1329 Encounter for screening for other suspected endocrine disorder: Secondary | ICD-10-CM

## 2017-02-28 DIAGNOSIS — Z01419 Encounter for gynecological examination (general) (routine) without abnormal findings: Secondary | ICD-10-CM | POA: Diagnosis present

## 2017-02-28 LAB — CBC WITH DIFFERENTIAL/PLATELET
Basophils Absolute: 0 cells/uL (ref 0–200)
Basophils Relative: 0 %
Eosinophils Absolute: 219 cells/uL (ref 15–500)
Eosinophils Relative: 3 %
HCT: 43.8 % (ref 35.0–45.0)
Hemoglobin: 14.6 g/dL (ref 11.7–15.5)
Lymphocytes Relative: 26 %
Lymphs Abs: 1898 cells/uL (ref 850–3900)
MCH: 31.1 pg (ref 27.0–33.0)
MCHC: 33.3 g/dL (ref 32.0–36.0)
MCV: 93.4 fL (ref 80.0–100.0)
MPV: 8.7 fL (ref 7.5–12.5)
Monocytes Absolute: 657 cells/uL (ref 200–950)
Monocytes Relative: 9 %
Neutro Abs: 4526 cells/uL (ref 1500–7800)
Neutrophils Relative %: 62 %
Platelets: 337 10*3/uL (ref 140–400)
RBC: 4.69 MIL/uL (ref 3.80–5.10)
RDW: 14.4 % (ref 11.0–15.0)
WBC: 7.3 10*3/uL (ref 3.8–10.8)

## 2017-02-28 LAB — POCT URINALYSIS DIPSTICK
Bilirubin, UA: NEGATIVE
Glucose, UA: NEGATIVE
Ketones, UA: NEGATIVE
Leukocytes, UA: NEGATIVE
Nitrite, UA: NEGATIVE
Spec Grav, UA: 1.01
Urobilinogen, UA: NEGATIVE
pH, UA: 6

## 2017-02-28 LAB — TSH: TSH: 1.85 mIU/L

## 2017-02-28 NOTE — Patient Instructions (Signed)
Continue to see Dr. York Spaniel for endocrinology issues including hyperlipidemia and diabetic control. Follow-up with Dr. Alinda Money regarding hematuria. Return in one year or as needed. Continue to see Dr. Lorrene Reid for chronic kidney disease.

## 2017-03-01 LAB — VITAMIN D 25 HYDROXY (VIT D DEFICIENCY, FRACTURES): Vit D, 25-Hydroxy: 28 ng/mL — ABNORMAL LOW (ref 30–100)

## 2017-03-01 LAB — MICROALBUMIN / CREATININE URINE RATIO
Creatinine, Urine: 111 mg/dL (ref 20–320)
Microalb Creat Ratio: 231 mcg/mg creat — ABNORMAL HIGH (ref <30)
Microalb, Ur: 25.6 mg/dL

## 2017-03-04 LAB — CYTOLOGY - PAP: Diagnosis: NEGATIVE

## 2017-03-12 NOTE — Progress Notes (Signed)
Subjective:    Patient ID: [name], female    DOB: 03/18/1951, 66 y.o.   MRN: [id number]  HPI 66 year old Female in today for health maintenance exam and evaluation of multiple complex medical issues including chronic kidney disease stage IV followed by Dr. Lorrene Reid, type 2 diabetes mellitus followed by Dr. York Spaniel, essential hypertension, metabolic syndrome, hyperlipidemia intolerant of TriCor, history of gout, obesity.  Recently has seen Dr. Alinda Money with an episode of hematuria. This occurred mid to late February. She passed numerous clots. She denied nausea vomiting or fever or dysuria. She was treated with cephalexin for possible UTI.  History of ureteral stricture 2014 and ureteral obstruction 2016. She had cystoscopy by Dr. Alinda Money February 27. Left and right ureteral orifices were patent. Urine microscopic showed 3-10 red blood cells per high-powered field. She is scheduled have CT of abdomen and pelvis without contrast. He feels that is not unusual for her to have microscopic hematuria considering her prior surgical history but the gross hematuria was concerning but perhaps related to a recent infection. CT should rule out any renal masses.  She has a history of significant hypertriglyceridemia and is unable to tolerate TriCor because she apparently developed a fever as a side effect and this was discontinued in the summer of 2012.  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  In 2007 she had right-sided flank pain and was thought to have nephrolithiasis. 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 Creatinine was mildly elevated at 1.5. In January 2008 she was admitted with dehydration and renal insufficiency. Creatinine was initially 1.6 but improved with hydration to 1.39. She was on antibiotics for wound  infection and had nausea and vomiting. In July 2009 she was admitted with acute renal failure with creatinine up to 6.0. She had a left ureteral stent placed. Renogram showed poor function and poor clearance of the tracer from the left kidney as well as increase in retention of the right kidney. Retrograde pyelogram showed no evidence of obstruction. It was never clear exactly what caused the acute renal failure. It was thought perhaps to be in acute on chronic injury. There was possible contribution from obstruction and metformin.  History of hernia repair by Dr. Theda Sers at [facility] in 2010. She subsequently underwent an incisional ventral hernia repair with mesh December 2011 by Dr. Dossie Der at [facility]. Baseline creatinine was 2.24 at that point. She was on the kidney transplant list for a period of time and is now been placed is inactive because of improvement.  She is allergic to Sulfa. She says Altace has caused a cough in the past. Intolerant of Tri-cor due to fever.  Additional problems include GE reflux, allergic rhinitis, dependent edema, osteopenia, history of carpal tunnel syndrome in 2006. Ganglion cyst removed from right wrist in 1959. Seceding section [id number]. D&C 1998.  Social history: She is married. Has one adult daughter who is a Lawyer in [place]. Does not smoke. Occasional wine consumption. She is now retired and formerly worked in the [employer].  Patient says her dry weight is 212 pounds.  Lab work done in early February by endocrinologist shows hemoglobin A1c of 6.9%. Creatinine 2.0. BUN 39. Triglycerides 444. Total cholesterol 204. HDL cholesterol 34. Liver functions normal.  Family history: Father with history of aortic  valve replacement, hyperlipidemia, rheumatic heart disease. Mother with history of hypertension, hypothyroidism, hyperlipidemia, COPD. One sister died in 2004/04/06 with coronary artery disease and hyperlipidemia. One sister living with  hyperlipidemia.    Review of Systems  Constitutional: Positive for fatigue.  Respiratory: Negative.   Cardiovascular: Negative.   Gastrointestinal: Negative.   Neurological: Negative.   Psychiatric/Behavioral: Negative.        Objective:   Physical Exam  Constitutional: She is oriented to person, place, and time. She appears well-developed and well-nourished. No distress.  HENT:  Head: Normocephalic and atraumatic.  Right Ear: External ear normal.  Left Ear: External ear normal.  Mouth/Throat: Oropharynx is clear and moist.  Eyes: Conjunctivae and EOM are normal. Pupils are equal, round, and reactive to light. Right eye exhibits no discharge. Left eye exhibits no discharge. No scleral icterus.  Neck: Neck supple. No JVD present. No thyromegaly present.  Cardiovascular: Normal rate, regular rhythm, normal heart sounds and intact distal pulses.   No murmur heard. Pulmonary/Chest: Effort normal and breath sounds normal. She has no wheezes. She has no rales.  Abdominal: Soft. Bowel sounds are normal. She exhibits no distension and no mass. There is no tenderness. There is no rebound and no guarding.  Genitourinary:  Genitourinary Comments: Pap taken. Bimanual normal.  Musculoskeletal: She exhibits no edema.  Lymphadenopathy:    She has no cervical adenopathy.  Neurological: She is alert and oriented to person, place, and time. She has normal reflexes. No cranial nerve deficit. Coordination normal.  Skin: Skin is warm and dry. No rash noted. She is not diaphoretic.  Psychiatric: She has a normal mood and affect. Her behavior is normal. Judgment and thought content normal.  Vitals reviewed.         Assessment & Plan:  Recent gross hematuria-being evaluated by Dr. Alinda Money and scheduled for CT of the abdomen and pelvis in the near future  Controlled Type 2 diabetes mellitus-stable on current regimen and followed by Dr. Chalmers Cater  Hypertriglyceridemia-intolerant of  TriCor  Essential hypertension  Chronic kidney disease stage IV followed by Dr. Lorrene Reid with creatinine stable around 2  Metabolic syndrome  History of gout-no recent attacks  Vitamin D deficiency-level is 28. She should discuss replacement with Dr. Lorrene Reid.  Obesity-encouraged diet and exercise. Dry weight is 212 pounds according to patient  Plan: I'm comfortable with patient seeing me on a yearly basis and she is followed closely by other physicians.

## 2017-06-16 ENCOUNTER — Encounter: Payer: Self-pay | Admitting: Pulmonary Disease

## 2017-06-16 ENCOUNTER — Ambulatory Visit (INDEPENDENT_AMBULATORY_CARE_PROVIDER_SITE_OTHER): Payer: BC Managed Care – PPO | Admitting: Pulmonary Disease

## 2017-06-16 VITALS — HR 75 | Ht 64.0 in | Wt 219.0 lb

## 2017-06-16 DIAGNOSIS — G4733 Obstructive sleep apnea (adult) (pediatric): Secondary | ICD-10-CM | POA: Diagnosis not present

## 2017-06-16 NOTE — Patient Instructions (Signed)
Follow up in 1 year.

## 2017-06-16 NOTE — Progress Notes (Signed)
Current Outpatient Prescriptions on File Prior to Visit  Medication Sig  . aspirin 81 MG chewable tablet Chew 81 mg by mouth daily.    Marland Kitchen atenolol (TENORMIN) 100 MG tablet Take 100 mg by mouth daily.    Marland Kitchen atorvastatin (LIPITOR) 40 MG tablet TAKE 1 TABLET BY MOUTH ONCE DAILY  . Cholecalciferol (VITAMIN D) 2000 UNITS CAPS Take by mouth.    . febuxostat (ULORIC) 40 MG tablet Take 40 mg by mouth.  Marland Kitchen glucose blood (ONETOUCH VERIO) test strip Check blood sugar 3 x daily  . Insulin NPH Human, Isophane, (NOVOLIN N [place]) Inject 10 mLs into the skin daily.   . Lancets (ONETOUCH ULTRASOFT) lancets Use as instructed  . loratadine (CLARITIN) 10 MG tablet Take 10 mg by mouth daily as needed.   . Multiple Vitamin (MULTIVITAMIN) capsule Take 1 capsule by mouth daily.  . promethazine (PHENERGAN) 25 MG tablet Take 1 tablet (25 mg total) by mouth every 8 (eight) hours as needed for nausea.  . sodium bicarbonate 650 MG tablet Take 650 mg by mouth 2 (two) times daily.    No current facility-administered medications on file prior to visit.      Chief Complaint  Patient presents with  . Follow-up    Wear CPAP nightly. Pt still having increased daytime sleepiness. DME: [facility]     Sleep tests HST 09/12/16 >> AHI 30.1, SaO2 low 74% Auto CPAP 05/17/17 to 06/15/17 >> used on 30 of 30 nights with average 6 hrs 54 min.  Average AHI 0.5 with median CPAP 8 and 95 th percentile CPAP 10 cm H2O  Past medical history CKD, HLD, DM, Neuropathy, HTN, GERD  Past surgical history, Family history, Social history, Allergies all reviewed.  Vital Signs Pulse 75   Ht 5\' 4"  (1.626 m)   Wt 219 lb (99.3 kg)   SpO2 98%   BMI 37.59 kg/m   History of Present Illness [name] is a 66 y.o. female with OSA.  She uses CPAP nightly.  Has nasal pillow mask.  Gets occasional crusting around nares, and will use neosporin.  Sleeping better.  Still falls asleep at times when she is sitting quiet during the day.  Physical  Exam  General - No distress ENT - No sinus tenderness, no oral exudate, no LAN Cardiac - s1s2 regular, no murmur Chest - No wheeze/rales/dullness Back - No focal tenderness Abd - Soft, non-tender Ext - No edema Neuro - Normal strength Skin - No rashes Psych - normal mood, and behavior   Assessment/Plan  Obstructive sleep apnea. - she is compliant with CPAP and reports benefit from therapy - continue auto CPAP - advised her to avoid antibiotic creams due to potential risk of antibiotic resistance   Patient Instructions  Follow up in 1 year    [name], MD [place] Pager:  [phone] 06/16/2017, 3:48 PM

## 2017-10-14 ENCOUNTER — Ambulatory Visit (INDEPENDENT_AMBULATORY_CARE_PROVIDER_SITE_OTHER): Payer: BC Managed Care – PPO | Admitting: Internal Medicine

## 2017-10-14 DIAGNOSIS — Z23 Encounter for immunization: Secondary | ICD-10-CM

## 2017-10-14 NOTE — Progress Notes (Signed)
Flu shot given

## 2017-10-14 NOTE — Patient Instructions (Signed)
Flu shot given

## 2017-12-29 ENCOUNTER — Telehealth: Payer: Self-pay

## 2017-12-29 DIAGNOSIS — E2839 Other primary ovarian failure: Secondary | ICD-10-CM

## 2017-12-29 DIAGNOSIS — N184 Chronic kidney disease, stage 4 (severe): Secondary | ICD-10-CM

## 2017-12-29 NOTE — Telephone Encounter (Signed)
OK to order. Stage 4 kidney disease and estrogen deficiency. Last done in 2016

## 2017-12-29 NOTE — Telephone Encounter (Signed)
Patient called states she thinks she's do for a bone density test, she thinks she had one done about 2 years ago. Okay to order?  Her CPE is scheduled in March.

## 2017-12-29 NOTE — Telephone Encounter (Signed)
Ordered

## 2017-12-31 ENCOUNTER — Other Ambulatory Visit: Payer: Self-pay | Admitting: Internal Medicine

## 2017-12-31 DIAGNOSIS — Z1231 Encounter for screening mammogram for malignant neoplasm of breast: Secondary | ICD-10-CM

## 2017-12-31 DIAGNOSIS — E2839 Other primary ovarian failure: Secondary | ICD-10-CM

## 2018-01-27 ENCOUNTER — Ambulatory Visit
Admission: RE | Admit: 2018-01-27 | Discharge: 2018-01-27 | Disposition: A | Discharge status: Home / Self Care | Payer: BC Managed Care – PPO | Source: Ambulatory Visit | Attending: Internal Medicine | Admitting: Internal Medicine

## 2018-01-27 DIAGNOSIS — E2839 Other primary ovarian failure: Secondary | ICD-10-CM

## 2018-01-27 DIAGNOSIS — Z1231 Encounter for screening mammogram for malignant neoplasm of breast: Secondary | ICD-10-CM

## 2018-02-03 ENCOUNTER — Other Ambulatory Visit: Payer: Self-pay

## 2018-02-03 DIAGNOSIS — Z0000 Encounter for general adult medical examination without abnormal findings: Secondary | ICD-10-CM

## 2018-02-03 DIAGNOSIS — Z8639 Personal history of other endocrine, nutritional and metabolic disease: Secondary | ICD-10-CM

## 2018-02-03 DIAGNOSIS — E118 Type 2 diabetes mellitus with unspecified complications: Secondary | ICD-10-CM

## 2018-02-03 DIAGNOSIS — E785 Hyperlipidemia, unspecified: Secondary | ICD-10-CM

## 2018-02-03 DIAGNOSIS — I10 Essential (primary) hypertension: Secondary | ICD-10-CM

## 2018-02-27 ENCOUNTER — Other Ambulatory Visit: Payer: BC Managed Care – PPO | Admitting: Internal Medicine

## 2018-02-27 DIAGNOSIS — E785 Hyperlipidemia, unspecified: Secondary | ICD-10-CM

## 2018-02-27 DIAGNOSIS — Z8639 Personal history of other endocrine, nutritional and metabolic disease: Secondary | ICD-10-CM

## 2018-02-27 DIAGNOSIS — E118 Type 2 diabetes mellitus with unspecified complications: Secondary | ICD-10-CM

## 2018-02-27 DIAGNOSIS — I10 Essential (primary) hypertension: Secondary | ICD-10-CM

## 2018-02-27 DIAGNOSIS — M1A09X0 Idiopathic chronic gout, multiple sites, without tophus (tophi): Secondary | ICD-10-CM

## 2018-02-27 DIAGNOSIS — Z0000 Encounter for general adult medical examination without abnormal findings: Secondary | ICD-10-CM

## 2018-02-27 LAB — URIC ACID: Uric Acid, Serum: 5.6 mg/dL (ref 2.5–7.0)

## 2018-02-27 NOTE — Addendum Note (Signed)
Addended by: Mady Haagensen on: 02/27/2018 09:09 AM   Modules accepted: Orders

## 2018-02-28 LAB — COMPLETE METABOLIC PANEL WITH GFR
AG Ratio: 1.5 (calc) (ref 1.0–2.5)
ALT: 15 U/L (ref 6–29)
AST: 17 U/L (ref 10–35)
Albumin: 3.9 g/dL (ref 3.6–5.1)
Alkaline phosphatase (APISO): 106 U/L (ref 33–130)
BUN/Creatinine Ratio: 21 (calc) (ref 6–22)
BUN: 48 mg/dL — ABNORMAL HIGH (ref 7–25)
CO2: 21 mmol/L (ref 20–32)
Calcium: 10.2 mg/dL (ref 8.6–10.4)
Chloride: 111 mmol/L — ABNORMAL HIGH (ref 98–110)
Creat: 2.24 mg/dL — ABNORMAL HIGH (ref 0.50–0.99)
GFR, Est African American: 26 mL/min/{1.73_m2} — ABNORMAL LOW (ref >=60)
GFR, Est Non African American: 22 mL/min/{1.73_m2} — ABNORMAL LOW (ref >=60)
Globulin: 2.6 g/dL (calc) (ref 1.9–3.7)
Glucose, Bld: 123 mg/dL — ABNORMAL HIGH (ref 65–99)
Potassium: 5 mmol/L (ref 3.5–5.3)
Sodium: 142 mmol/L (ref 135–146)
Total Bilirubin: 0.4 mg/dL (ref 0.2–1.2)
Total Protein: 6.5 g/dL (ref 6.1–8.1)

## 2018-02-28 LAB — LIPID PANEL
Cholesterol: 194 mg/dL (ref <200)
HDL: 32 mg/dL — ABNORMAL LOW (ref >50)
LDL Cholesterol (Calc): 105 mg/dL (calc) — ABNORMAL HIGH
Non-HDL Cholesterol (Calc): 162 mg/dL (calc) — ABNORMAL HIGH (ref <130)
Total CHOL/HDL Ratio: 6.1 (calc) — ABNORMAL HIGH (ref <5.0)
Triglycerides: 398 mg/dL — ABNORMAL HIGH (ref <150)

## 2018-02-28 LAB — CBC WITH DIFFERENTIAL/PLATELET
Basophils Absolute: 47 cells/uL (ref 0–200)
Basophils Relative: 0.8 %
Eosinophils Absolute: 218 cells/uL (ref 15–500)
Eosinophils Relative: 3.7 %
HCT: 38 % (ref 35.0–45.0)
Hemoglobin: 13 g/dL (ref 11.7–15.5)
Lymphs Abs: 1363 cells/uL (ref 850–3900)
MCH: 30.7 pg (ref 27.0–33.0)
MCHC: 34.2 g/dL (ref 32.0–36.0)
MCV: 89.6 fL (ref 80.0–100.0)
MPV: 9.4 fL (ref 7.5–12.5)
Monocytes Relative: 10.9 %
Neutro Abs: 3629 cells/uL (ref 1500–7800)
Neutrophils Relative %: 61.5 %
Platelets: 296 10*3/uL (ref 140–400)
RBC: 4.24 10*6/uL (ref 3.80–5.10)
RDW: 13.9 % (ref 11.0–15.0)
Total Lymphocyte: 23.1 %
WBC mixed population: 643 cells/uL (ref 200–950)
WBC: 5.9 10*3/uL (ref 3.8–10.8)

## 2018-02-28 LAB — MICROALBUMIN / CREATININE URINE RATIO
Creatinine, Urine: 91 mg/dL (ref 20–275)
Microalb Creat Ratio: 248 mcg/mg creat — ABNORMAL HIGH (ref <30)
Microalb, Ur: 22.6 mg/dL

## 2018-02-28 LAB — HEMOGLOBIN A1C
Hgb A1c MFr Bld: 7.2 % of total Hgb — ABNORMAL HIGH (ref <5.7)
Mean Plasma Glucose: 160 (calc)
eAG (mmol/L): 8.9 (calc)

## 2018-02-28 LAB — VITAMIN D 25 HYDROXY (VIT D DEFICIENCY, FRACTURES): Vit D, 25-Hydroxy: 43 ng/mL (ref 30–100)

## 2018-02-28 LAB — TSH: TSH: 1.5 mIU/L (ref 0.40–4.50)

## 2018-03-03 ENCOUNTER — Ambulatory Visit (INDEPENDENT_AMBULATORY_CARE_PROVIDER_SITE_OTHER): Payer: BC Managed Care – PPO | Admitting: Internal Medicine

## 2018-03-03 ENCOUNTER — Encounter: Payer: Self-pay | Admitting: Internal Medicine

## 2018-03-03 VITALS — BP 132/80 | HR 75 | Ht 63.75 in | Wt 214.0 lb

## 2018-03-03 DIAGNOSIS — R8290 Unspecified abnormal findings in urine: Secondary | ICD-10-CM

## 2018-03-03 DIAGNOSIS — Z0000 Encounter for general adult medical examination without abnormal findings: Secondary | ICD-10-CM

## 2018-03-03 DIAGNOSIS — E118 Type 2 diabetes mellitus with unspecified complications: Secondary | ICD-10-CM | POA: Diagnosis not present

## 2018-03-03 DIAGNOSIS — M8580 Other specified disorders of bone density and structure, unspecified site: Secondary | ICD-10-CM | POA: Diagnosis not present

## 2018-03-03 DIAGNOSIS — E781 Pure hyperglyceridemia: Secondary | ICD-10-CM

## 2018-03-03 DIAGNOSIS — K219 Gastro-esophageal reflux disease without esophagitis: Secondary | ICD-10-CM

## 2018-03-03 DIAGNOSIS — Z6837 Body mass index (BMI) 37.0-37.9, adult: Secondary | ICD-10-CM

## 2018-03-03 DIAGNOSIS — I10 Essential (primary) hypertension: Secondary | ICD-10-CM

## 2018-03-03 DIAGNOSIS — R609 Edema, unspecified: Secondary | ICD-10-CM | POA: Diagnosis not present

## 2018-03-03 DIAGNOSIS — N184 Chronic kidney disease, stage 4 (severe): Secondary | ICD-10-CM | POA: Diagnosis not present

## 2018-03-03 LAB — POCT URINALYSIS DIPSTICK
Appearance: ABNORMAL
Bilirubin, UA: NEGATIVE
Glucose, UA: NEGATIVE
Ketones, UA: NEGATIVE
Nitrite, UA: NEGATIVE
Odor: ABNORMAL
Spec Grav, UA: 1.01 (ref 1.010–1.025)
Urobilinogen, UA: 0.2 E.U./dL
pH, UA: 6.5 (ref 5.0–8.0)

## 2018-03-03 NOTE — Progress Notes (Signed)
Subjective:    Patient ID: [name], female    DOB: 1951-07-16, 67 y.o.   MRN: [id number]  HPI  67 year old Female for health maintenance exam and evaluation of medical issues. Saw Dr. Lorrene Reid in November and will see again in June. I see an increase in Creatinine and will forward labs to her. Sees Dr. Chalmers Cater for lipid and diabetic management.Hgb is 7.2% about 3 weeks ago at Dr. Almetta Lovely office and is still the same. Is to follow up with her.  Dr. Amil Amen wanted uric acid test for gout and this is WNL. Sees him yearly now.  Still seen at [facility] for possible kidney transplant but is on inactive list but gets seen yearly.  Bone density in January was -2.9. Dr. Lorrene Reid says pt can take Prolia but pt does not want to now.  Mamoogram OK  Colonoscopy to be done this Spring. Had diabetic eye exam in Sept. 2018  History of significant hypertriglyceridemia and is unable to tolerate TriCor because she apparently developed a fever as a side effect of this was discontinued in the summer 2012.    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  She is allergic to sulfa.  Says Altace is caused a cough in the past.  History of GE reflux, allergic rhinitis, dependent edema, osteopenia, carpal tunnel syndrome 2005-03-24, ganglion cyst removed from right wrist in 1958-03-24, C-section 24-Mar-1982.  D&C 1998.  Patient says her dry weight is 212 pounds.  Social history: She is married.  She has 1 adult daughter who is a Lawyer in [place].  Does not smoke.  Occasional wine consumption.  Formally worked for [employer] system but is now retired.  Family history: Father with history of aortic valve replacement, hyperlipidemia, rheumatic heart disease.  Mother with history of hypertension, hypothyroidism hyperlipidemia COPD.  One sister died in 03-24-2004 with coronary artery disease and hyperlipidemia.  One sister living with  hyperlipidemia.  In  24-Mar-2006 she had right-sided flank pain and was thought to have nephrolithiasis.  However was found to have an obstruction at the [facility].  Underwent reconstructive surgery and had an ileal diversion.  Creatinine was mildly elevated at 1.5.  In January 2008 she was admitted with dehydration and renal insufficiency.  Creatinine was initially 1.6 but improved with hydration to 1.39.  She was on antibiotics for a wound infection and had nausea and vomiting.  In July 2009 she was admitted with acute renal failure with creatinine up to 6.  She had a left ureteral stent placed.  Renogram showed poor function and poor clearance of the tracer from left kidney as well as increase in retention of the right kidney.  Retrograde pyelogram showed no evidence of obstruction.  It was never clear exactly what caused the acute renal failure.  It was thought to perhaps be acute on chronic injury.  There was possible contribution from obstruction and metformin.  Had hernia repair by Dr. Theda Sers at [facility] in 2009-03-24.  An incisional ventral hernia repair with mesh December 2011 by Dr. Dossie Der at [facility].  Baseline creatinine was 2.24 at that point.          Review of Systems see above with no new complaints     Objective:   Physical Exam  Constitutional: She is oriented to person, place, and time. She appears well-developed and well-nourished.  HENT:  Head: Normocephalic and atraumatic.  Right Ear:  External ear normal.  Left Ear: External ear normal.  Mouth/Throat: Oropharynx is clear and moist.  Eyes: Pupils are equal, round, and reactive to light. Conjunctivae and EOM are normal. Right eye exhibits no discharge. Left eye exhibits no discharge.  Neck: Neck supple. No JVD present. No thyromegaly present.  Cardiovascular: Normal rate, regular rhythm, normal heart sounds and intact distal pulses.  No murmur heard. Pulmonary/Chest: Breath sounds normal. No respiratory distress. She has no wheezes. She has no rales.  Breasts  normal female without masses  Abdominal: Soft. Bowel sounds are normal. She exhibits no distension. There is no tenderness. There is no rebound and no guarding.  Genitourinary:  Genitourinary Comments: Pap taken in 2018.  Bimanual normal  Musculoskeletal: She exhibits no edema.  Lymphadenopathy:    She has no cervical adenopathy.  Neurological: She is alert and oriented to person, place, and time. She has normal reflexes. No cranial nerve deficit. Coordination normal.  Skin: Skin is warm and dry. No rash noted.  Psychiatric: She has a normal mood and affect. Her behavior is normal. Judgment and thought content normal.  Vitals reviewed.         Assessment & Plan:  Chronic kidney disease stage IV followed by Dr. Lorrene Reid  Controlled type 2 diabetes followed by Dr. Chalmers Cater, endocrinologist  Hypertriglyceridemia intolerant of TriCor  Essential hypertension-stable  Metabolic syndrome  History of gout  History of vitamin D deficiency  Plan: Creatinine is 2.24 and previously was 1.93.  Results faxed to Dr. Lorrene Reid.  Fasting glucose is 123.  Hemoglobin A1c 7.2%.  Triglycerides elevated at 398.  Vitamin D level is normal.  Return in 1 year or as needed.  Obesity-encourage diet exercise and weight loss

## 2018-03-04 LAB — URINE CULTURE
MICRO NUMBER:: 90281609
SPECIMEN QUALITY:: ADEQUATE

## 2018-03-10 ENCOUNTER — Other Ambulatory Visit: Payer: Self-pay | Admitting: Gastroenterology

## 2018-03-29 NOTE — Patient Instructions (Addendum)
It was a pleasure to see you today.  Continue follow-up with Dr. Lorrene Reid and Dr. Chalmers Cater. Return in 1 year.  Try to diet exercise and lose some weight.

## 2018-07-06 ENCOUNTER — Other Ambulatory Visit: Payer: Self-pay

## 2018-07-06 ENCOUNTER — Encounter (HOSPITAL_COMMUNITY): Payer: Self-pay

## 2018-07-07 ENCOUNTER — Ambulatory Visit: Payer: Medicare Other | Admitting: Pulmonary Disease

## 2018-07-07 ENCOUNTER — Encounter: Payer: Self-pay | Admitting: Pulmonary Disease

## 2018-07-07 VITALS — BP 128/86 | HR 82 | Ht 64.0 in | Wt 217.0 lb

## 2018-07-07 DIAGNOSIS — G4733 Obstructive sleep apnea (adult) (pediatric): Secondary | ICD-10-CM

## 2018-07-07 NOTE — Progress Notes (Signed)
[name], Critical Care, and Sleep Medicine  Chief Complaint  Patient presents with  . Follow-up    Pt is doing well overall with cpap machine.    Constitutional: BP 128/86 (BP Location: Left Arm, Cuff Size: Normal)   Pulse 82   Ht 5\' 4"  (1.626 m)   Wt 217 lb (98.4 kg)   SpO2 97%   BMI 37.25 kg/m   History of Present Illness: [name] is a 67 y.o. female with obstructive sleep apnea.  She is doing well with CPAP.  She has nasal pillows mask.  Sometimes get raw in her nose from mask.  Has trouble breathing sometimes from left side of her nose.  Gets about 7.5 hrs sleep per night.  Feels rested during the day.  Not having headaches, post nasal drip, dry mouth, sore throat, or cough.    She is scheduled for colonoscopy with Dr. Collene Moore later this week.  Comprehensive Respiratory Exam:  Appearance - well kempt  ENMT - nasal mucosa moist, turbinates clear, mild deviation of nasal septum, no dental lesions, no gingival bleeding, no oral exudates, no tonsillar hypertrophy Neck - no masses, trachea midline, no thyromegaly, no elevation in JVP Respiratory - normal appearance of chest wall, normal respiratory effort w/o accessory muscle use, no dullness on percussion, no wheezing or rales CV - s1s2 regular rate and rhythm, no murmurs, no peripheral edema, no varicosities, radial pulses symmetric GI - soft, non tender Lymph - no adenopathy noted in neck and axillary areas MSK - normal muscle strength and tone, normal gait Ext - no cyanosis, clubbing, or joint inflammation noted Skin - no rashes, lesions, or ulcers Neuro - oriented to person, place, and time Psych - normal mood and affect   Assessment/Plan:  Obstructive sleep apnea. - she is compliant with CPAP and reports benefit - continue auto CPAP  Nasal septal deviation. - discussed options for different mask fit   Patient Instructions  Call if you need an order to get a different CPAP mask  Follow up in 1  year    Debbie Mires, MD [name] 07/07/2018, 11:32 AM  Flow Sheet  Sleep tests: HST 09/12/16 >> AHI 30.1, SaO2 low 74% Auto CPAP 06/05/18 to 07/04/18 >> used on 30 of 30 nights with average 7 hrs 34 min.  Average AHI 0.9 with median CPAP 8 and 95 th percentile CPAP 11 cm H2O  Past Medical History: She  has a past medical history of Allergic rhinitis, Carbuncle, trunk, Carpal tunnel syndrome, Chronic kidney disease, Combined hyperlipidemia associated with type 2 diabetes mellitus ([place]), Complication of anesthesia, Cyclical vomiting, Dependent edema, Diabetes mellitus type II, Diabetic autonomic neuropathy ([place]), Diabetic peripheral neuropathy associated with type 2 diabetes mellitus ([place]), Dyspepsia, Essential hypertension, benign, GERD (gastroesophageal reflux disease), Goiter, Hypercholesteremia, Menopause, Obesity, OSA (obstructive sleep apnea) (09/23/2016), Osteopenia, PONV (postoperative nausea and vomiting), Tachycardia, and Type II or unspecified type diabetes mellitus without mention of complication, not stated as uncontrolled.  Past Surgical History: She  has a past surgical history that includes Ganglion cyst excision (2836); Cesarean section (1983); and Dilation and curettage of uterus (1988).  Family History: Her family history includes COPD in her mother; Cancer in her maternal grandfather; Diabetes in her father and paternal grandmother; Heart disease in her father and sister; Hyperlipidemia in her father; Hypertension in her mother; Thyroid disease in her mother.  Social History: She  reports that she has never smoked. She has never used smokeless tobacco. She reports that  she does not drink alcohol or use drugs.  Medications: Allergies as of 07/07/2018      Reactions   Fish Oil Shortness Of Breath   Fenofibrate    fever   Ciprocin-fluocin-procin [fluocinolone]    Acute kidney failure   Orange Fruit [citrus]    Ramipril Cough   Sulfa Antibiotics Rash       Medication List        Accurate as of 07/07/18 11:32 AM. Always use your most recent med list.          aspirin 81 MG chewable tablet Chew 81 mg by mouth daily.   atenolol 100 MG tablet Commonly known as:  TENORMIN Take 100 mg by mouth daily.   atorvastatin 40 MG tablet Commonly known as:  LIPITOR TAKE 1 TABLET BY MOUTH ONCE DAILY   cholecalciferol 1000 units tablet Commonly known as:  VITAMIN D Take 3,000 Units by mouth 2 (two) times daily.   febuxostat 40 MG tablet Commonly known as:  ULORIC Take 40 mg by mouth daily.   glucose blood test strip Commonly known as:  ONETOUCH VERIO Check blood sugar 3 x daily   insulin aspart 100 UNIT/ML injection Commonly known as:  novoLOG Inject 3-5 Units into the skin 3 (three) times daily before meals.   loratadine 10 MG tablet Commonly known as:  CLARITIN Take 10 mg by mouth daily as needed.   multivitamin capsule Take 1 capsule by mouth daily.   NOVOLIN N [place] Inject 12 Units into the skin daily.   onetouch ultrasoft lancets Use as instructed   promethazine 25 MG tablet Commonly known as:  PHENERGAN Take 25 mg by mouth daily as needed for nausea.   sodium bicarbonate 650 MG tablet Take 650 mg by mouth 2 (two) times daily.   TURMERIC PO Take 665 mg by mouth daily.

## 2018-07-07 NOTE — Patient Instructions (Signed)
Call if you need an order to get a different CPAP mask  Follow up in 1 year

## 2018-07-09 ENCOUNTER — Encounter (HOSPITAL_COMMUNITY)
Admission: RE | Disposition: A | Discharge status: Home / Self Care | Payer: Self-pay | Source: Ambulatory Visit | Attending: Gastroenterology

## 2018-07-09 ENCOUNTER — Other Ambulatory Visit: Payer: Self-pay

## 2018-07-09 ENCOUNTER — Encounter (HOSPITAL_COMMUNITY): Payer: Self-pay | Admitting: *Deleted

## 2018-07-09 ENCOUNTER — Ambulatory Visit (HOSPITAL_COMMUNITY)
Admission: RE | Admit: 2018-07-09 | Discharge: 2018-07-09 | Disposition: A | Discharge status: Home / Self Care | Payer: Medicare Other | Source: Ambulatory Visit | Attending: Gastroenterology | Admitting: Gastroenterology

## 2018-07-09 ENCOUNTER — Ambulatory Visit (HOSPITAL_COMMUNITY): Payer: Medicare Other | Admitting: Anesthesiology

## 2018-07-09 DIAGNOSIS — Z7982 Long term (current) use of aspirin: Secondary | ICD-10-CM | POA: Insufficient documentation

## 2018-07-09 DIAGNOSIS — Z794 Long term (current) use of insulin: Secondary | ICD-10-CM | POA: Insufficient documentation

## 2018-07-09 DIAGNOSIS — Z8249 Family history of ischemic heart disease and other diseases of the circulatory system: Secondary | ICD-10-CM | POA: Insufficient documentation

## 2018-07-09 DIAGNOSIS — E1122 Type 2 diabetes mellitus with diabetic chronic kidney disease: Secondary | ICD-10-CM | POA: Insufficient documentation

## 2018-07-09 DIAGNOSIS — Z86010 Personal history of colonic polyps: Secondary | ICD-10-CM | POA: Diagnosis not present

## 2018-07-09 DIAGNOSIS — E1142 Type 2 diabetes mellitus with diabetic polyneuropathy: Secondary | ICD-10-CM | POA: Diagnosis not present

## 2018-07-09 DIAGNOSIS — M8580 Other specified disorders of bone density and structure, unspecified site: Secondary | ICD-10-CM | POA: Diagnosis not present

## 2018-07-09 DIAGNOSIS — E782 Mixed hyperlipidemia: Secondary | ICD-10-CM | POA: Diagnosis not present

## 2018-07-09 DIAGNOSIS — Z888 Allergy status to other drugs, medicaments and biological substances status: Secondary | ICD-10-CM | POA: Diagnosis not present

## 2018-07-09 DIAGNOSIS — Z1211 Encounter for screening for malignant neoplasm of colon: Secondary | ICD-10-CM | POA: Diagnosis present

## 2018-07-09 DIAGNOSIS — E669 Obesity, unspecified: Secondary | ICD-10-CM | POA: Insufficient documentation

## 2018-07-09 DIAGNOSIS — Z882 Allergy status to sulfonamides status: Secondary | ICD-10-CM | POA: Diagnosis not present

## 2018-07-09 DIAGNOSIS — E1143 Type 2 diabetes mellitus with diabetic autonomic (poly)neuropathy: Secondary | ICD-10-CM | POA: Insufficient documentation

## 2018-07-09 DIAGNOSIS — D124 Benign neoplasm of descending colon: Secondary | ICD-10-CM | POA: Diagnosis not present

## 2018-07-09 DIAGNOSIS — G4733 Obstructive sleep apnea (adult) (pediatric): Secondary | ICD-10-CM | POA: Diagnosis not present

## 2018-07-09 DIAGNOSIS — E7800 Pure hypercholesterolemia, unspecified: Secondary | ICD-10-CM | POA: Diagnosis not present

## 2018-07-09 DIAGNOSIS — I129 Hypertensive chronic kidney disease with stage 1 through stage 4 chronic kidney disease, or unspecified chronic kidney disease: Secondary | ICD-10-CM | POA: Insufficient documentation

## 2018-07-09 DIAGNOSIS — Z79899 Other long term (current) drug therapy: Secondary | ICD-10-CM | POA: Diagnosis not present

## 2018-07-09 DIAGNOSIS — N189 Chronic kidney disease, unspecified: Secondary | ICD-10-CM | POA: Diagnosis not present

## 2018-07-09 DIAGNOSIS — K219 Gastro-esophageal reflux disease without esophagitis: Secondary | ICD-10-CM | POA: Insufficient documentation

## 2018-07-09 DIAGNOSIS — Z6837 Body mass index (BMI) 37.0-37.9, adult: Secondary | ICD-10-CM | POA: Insufficient documentation

## 2018-07-09 HISTORY — PX: POLYPECTOMY: SHX5525

## 2018-07-09 HISTORY — DX: Nausea with vomiting, unspecified: R11.2

## 2018-07-09 HISTORY — DX: Adverse effect of unspecified anesthetic, initial encounter: T41.45XA

## 2018-07-09 HISTORY — PX: COLONOSCOPY WITH PROPOFOL: SHX5780

## 2018-07-09 HISTORY — DX: Other specified postprocedural states: Z98.890

## 2018-07-09 HISTORY — DX: Other complications of anesthesia, initial encounter: T88.59XA

## 2018-07-09 LAB — GLUCOSE, CAPILLARY: Glucose-Capillary: 121 mg/dL — ABNORMAL HIGH (ref 70–99)

## 2018-07-09 SURGERY — COLONOSCOPY WITH PROPOFOL
Anesthesia: Monitor Anesthesia Care

## 2018-07-09 MED ORDER — PROPOFOL 10 MG/ML IV BOLUS
INTRAVENOUS | Status: AC
  Filled 2018-07-09: qty 40

## 2018-07-09 MED ORDER — PROPOFOL 10 MG/ML IV BOLUS
INTRAVENOUS | Status: DC | PRN
  Administered 2018-07-09: 20 mg via INTRAVENOUS
  Administered 2018-07-09: 50 mg via INTRAVENOUS
  Administered 2018-07-09: 20 mg via INTRAVENOUS

## 2018-07-09 MED ORDER — DEXAMETHASONE SODIUM PHOSPHATE 10 MG/ML IJ SOLN
INTRAMUSCULAR | Status: DC | PRN
  Administered 2018-07-09: 8 mg via INTRAVENOUS

## 2018-07-09 MED ORDER — LACTATED RINGERS IV SOLN: INTRAVENOUS | Status: DC

## 2018-07-09 MED ORDER — PROPOFOL 500 MG/50ML IV EMUL
INTRAVENOUS | Status: DC | PRN
  Administered 2018-07-09: 125 ug/kg/min via INTRAVENOUS

## 2018-07-09 MED ORDER — ONDANSETRON HCL 4 MG/2ML IJ SOLN
INTRAMUSCULAR | Status: DC | PRN
  Administered 2018-07-09: 4 mg via INTRAVENOUS

## 2018-07-09 MED ORDER — SODIUM CHLORIDE 0.9 % IV SOLN
INTRAVENOUS | Status: DC
  Administered 2018-07-09: 1000 mL via INTRAVENOUS

## 2018-07-09 SURGICAL SUPPLY — 22 items

## 2018-07-09 NOTE — Transfer of Care (Signed)
Immediate Anesthesia Transfer of Care Note  Patient: [name]  Procedure(s) Performed: Procedure(s): COLONOSCOPY WITH PROPOFOL (N/A) POLYPECTOMY  Patient Location: PACU  Anesthesia Type:MAC  Level of Consciousness:  sedated, patient cooperative and responds to stimulation  Airway & Oxygen Therapy:Patient Spontanous Breathing and Patient connected to face mask oxgen  Post-op Assessment:  Report given to PACU RN and Post -op Vital signs reviewed and stable  Post vital signs:  Reviewed and stable  Last Vitals:  Vitals:   07/09/18 0624 07/09/18 0741  BP: 136/78 (!) 91/54  Pulse: 74 78  Resp: 19 13  Temp: 36.9 C 36.7 C  SpO2: 24% 401%    Complications: No apparent anesthesia complications

## 2018-07-09 NOTE — Anesthesia Preprocedure Evaluation (Signed)
Anesthesia Evaluation  Patient identified by MRN, date of birth, ID band Patient awake    Reviewed: Allergy & Precautions, NPO status , Patient's Chart, lab work & pertinent test results, reviewed documented beta blocker date and time   History of Anesthesia Complications (+) PONV and history of anesthetic complications  Airway Mallampati: II  TM Distance: >3 FB Neck ROM: Full    Dental  (+) Teeth Intact, Dental Advisory Given   Pulmonary sleep apnea ,    Pulmonary exam normal breath sounds clear to auscultation       Cardiovascular hypertension, Pt. on home beta blockers Normal cardiovascular exam Rhythm:Regular Rate:Normal     Neuro/Psych negative neurological ROS     GI/Hepatic Neg liver ROS, GERD  ,  Endo/Other  diabetes, Type 2, Insulin DependentObesity   Renal/GU Renal InsufficiencyRenal disease     Musculoskeletal negative musculoskeletal ROS (+)   Abdominal   Peds  Hematology negative hematology ROS (+)   Anesthesia Other Findings Day of surgery medications reviewed with the patient.  Reproductive/Obstetrics                             Anesthesia Physical Anesthesia Plan  ASA: III  Anesthesia Plan: MAC   Post-op Pain Management:    Induction: Intravenous  PONV Risk Score and Plan: 3 and Treatment may vary due to age or medical condition and Propofol infusion  Airway Management Planned: Nasal Cannula  Additional Equipment:   Intra-op Plan:   Post-operative Plan:   Informed Consent: I have reviewed the patients History and Physical, chart, labs and discussed the procedure including the risks, benefits and alternatives for the proposed anesthesia with the patient or authorized representative who has indicated his/her understanding and acceptance.   Dental advisory given  Plan Discussed with: CRNA and Anesthesiologist  Anesthesia Plan Comments:          Anesthesia Quick Evaluation

## 2018-07-09 NOTE — H&P (Signed)
[name] is an 67 y.o. female.   Chief Complaint: Colorectal cancer screening. HPI: 67 year old white female here for a screening colonoscopy. See office notes for details.  Past Medical History:  Diagnosis Date  . Allergic rhinitis   . Carbuncle, trunk   . Carpal tunnel syndrome   . Chronic kidney disease   . Combined hyperlipidemia associated with type 2 diabetes mellitus ([place])   . Complication of anesthesia   . Cyclical vomiting   . Dependent edema   . Diabetes mellitus type II   . Diabetic autonomic neuropathy ([place])   . Diabetic peripheral neuropathy associated with type 2 diabetes mellitus ([place])   . Dyspepsia   . Essential hypertension, benign   . GERD (gastroesophageal reflux disease)   . Goiter   . Hypercholesteremia   . Menopause   . Obesity   . OSA (obstructive sleep apnea) 09/23/2016  . Osteopenia   . PONV (postoperative nausea and vomiting)   . Tachycardia   . Type II or unspecified type diabetes mellitus without mention of complication, not stated as uncontrolled    Past Surgical History:  Procedure Laterality Date  . CESAREAN SECTION  1983  . DILATION AND CURETTAGE OF UTERUS  1988  . GANGLION CYST EXCISION  1959   rt wrist   Family History  Problem Relation Age of Onset  . COPD Mother   . Hypertension Mother   . Thyroid disease Mother   . Heart disease Father   . Hyperlipidemia Father   . Diabetes Father   . Heart disease Sister   . Cancer Maternal Grandfather   . Diabetes Paternal Grandmother    Social History:  reports that she has never smoked. She has never used smokeless tobacco. She reports that she does not drink alcohol or use drugs.  Allergies:  Allergies  Allergen Reactions  . Fish Oil Shortness Of Breath  . Fenofibrate     fever  . Ciprocin-Fluocin-Procin [Fluocinolone]     Acute kidney failure  . Orange Fruit [Citrus]   . Ramipril Cough  . Sulfa Antibiotics Rash   Medications Prior to Admission  Medication Sig Dispense  Refill  . aspirin 81 MG chewable tablet Chew 81 mg by mouth daily.      Marland Kitchen atenolol (TENORMIN) 100 MG tablet Take 100 mg by mouth daily.      Marland Kitchen atorvastatin (LIPITOR) 40 MG tablet TAKE 1 TABLET BY MOUTH ONCE DAILY 30 tablet 11  . cholecalciferol (VITAMIN D) 1000 units tablet Take 3,000 Units by mouth 2 (two) times daily.     . febuxostat (ULORIC) 40 MG tablet Take 40 mg by mouth daily.     Marland Kitchen glucose blood (ONETOUCH VERIO) test strip Check blood sugar 3 x daily 100 each 6  . insulin aspart (NOVOLOG) 100 UNIT/ML injection Inject 3-5 Units into the skin 3 (three) times daily before meals.    . Insulin NPH Human, Isophane, (NOVOLIN N [place]) Inject 12 Units into the skin daily.     . Lancets (ONETOUCH ULTRASOFT) lancets Use as instructed 100 each 12  . loratadine (CLARITIN) 10 MG tablet Take 10 mg by mouth daily as needed.     . Multiple Vitamin (MULTIVITAMIN) capsule Take 1 capsule by mouth daily.    . promethazine (PHENERGAN) 25 MG tablet Take 25 mg by mouth daily as needed for nausea.    . sodium bicarbonate 650 MG tablet Take 650 mg by mouth 2 (two) times daily.     Marland Kitchen  TURMERIC PO Take 665 mg by mouth daily.      Results for orders placed or performed during the hospital encounter of 07/09/18 (from the past 48 hour(s))  Glucose, capillary     Status: Abnormal   Collection Time: 07/09/18  6:43 AM  Result Value Ref Range   Glucose-Capillary 121 (H) 70 - 99 mg/dL   No results found.  Review of Systems  Constitutional: Negative.   HENT: Negative.   Eyes: Negative.   Respiratory: Negative.   Cardiovascular: Negative.   Gastrointestinal: Positive for constipation and heartburn. Negative for abdominal pain, diarrhea, nausea and vomiting.  Musculoskeletal: Positive for joint pain.  Skin: Negative.   Neurological: Negative.   Endo/Heme/Allergies: Negative.   Psychiatric/Behavioral: Negative.    Blood pressure 136/78, pulse 74, temperature 98.5 F (36.9 C), temperature source Oral, resp. rate  19, height 5\' 4"  (1.626 m), weight 98.4 kg (217 lb), SpO2 95 %. Physical Exam  Constitutional: She is oriented to person, place, and time. She appears well-developed and well-nourished.  Morbidly obese  HENT:  Head: Normocephalic and atraumatic.  Eyes: Pupils are equal, round, and reactive to light. Conjunctivae and EOM are normal.  Neck: Normal range of motion. Neck supple.  Cardiovascular: Normal rate and regular rhythm.  Respiratory: Effort normal and breath sounds normal.  GI: Soft. Bowel sounds are normal.  Musculoskeletal: Normal range of motion.  Neurological: She is alert and oriented to person, place, and time.  Skin: Skin is warm and dry.  Psychiatric: She has a normal mood and affect. Her behavior is normal. Judgment and thought content normal.    Assessment/Plan Colorectal cancer screening/personal history of colonic polyps: proceed with a colonoscopy at this time  Sher Shampine, MD 07/09/2018, 7:12 AM

## 2018-07-09 NOTE — Op Note (Signed)
[facility] Patient Name: [name] Procedure Date: 07/09/2018 MRN: [id number] Attending MD: Juanita Craver , MD Date of Birth: Aug 29, 1951 CSN: [id number] Age: 67 Admit Type: Inpatient Procedure:                Colonoscopy with cold biopsies. Indications:              Personal history of colonic polyps; CRC screening                            for colorectal malignant neoplasm. Providers:                Juanita Craver, MD, Kingsley Plan, RN, Alan Mulder, Technician, Anne Fu CRNA, CRNA Referring MD:             Cresenciano Lick. Renold Genta, MD & Jamal Maes, MD Medicines:                Monitored Anesthesia Care. Complications:            No immediate complications. Estimated Blood Loss:     Estimated blood loss was minimal. Procedure:                Pre-anesthesia assessment: - Prior to the                            procedure, a history and physical was performed,                            and patient medications and allergies were                            reviewed. The patient's tolerance of previous                            anesthesia was also reviewed. The risks and                            benefits of the procedure and the sedation options                            and risks were discussed with the patient. All                            questions were answered, and informed consent was                            obtained. Prior anticoagulants: The patient has                            taken no previous anticoagulant or antiplatelet                            agents. ASA Grade Assessment: III - A patient with  severe systemic disease. After reviewing the risks                            and benefits, the patient was deemed in                            satisfactory condition to undergo the procedure.                            After obtaining informed consent, the colonoscope     was passed under direct vision. Throughout the                            procedure, the patient's blood pressure, pulse, and                            oxygen saturations were monitored continuously. The                            EC-[id number] ([id number]) scope was introduced through                            the anus and advanced to the the cecum, identified                            by appendiceal orifice and ileocecal valve. The                            colonoscopy was performed without difficulty. The                            patient tolerated the procedure well. The quality                            of the bowel preparation was adequate. The                            ileocecal valve, the appendiceal orifice and the                            rectum were photographed. The bowel preparation                            used was GoLYTELY. Scope In: 7:22:09 AM Scope Out: 4:19:62 AM Scope Withdrawal Time: 0 hours 9 minutes 52 seconds  Total Procedure Duration: 0 hours 14 minutes 8 seconds  Findings:      Two small sessile polyps were found in the proximal descending       colon-these were removed by cold biopsies.      The exam was otherwise without abnormality on direct and retroflexion       views. Impression:               - Two small sessile polyps in the proximal  descending colon-removed by cold biopsies.                           - The examination was otherwise normal on direct                            and retroflexion views. Moderate Sedation:      MAC used. Recommendation:           - High fiber diet with augmented water consumption                            daily.                           - Continue present medications.                           - Await pathology results.                           - Repeat colonoscopy in 5 years for surveillance.                           - Return to my office PRN.                           - If the patient  has any abnormal GI symptoms in                            the interim, she has been advised to call the                            office ASAP for further recommendations. Procedure Code(s):        --- Professional ---                           [phone], Colonoscopy, flexible; with biopsy, single                            or multiple Diagnosis Code(s):        --- Professional ---                           D12.4, Benign neoplasm of descending colon                           Z86.010, Personal history of colonic polyps                           Z12.11, Encounter for screening for malignant                            neoplasm of colon CPT copyright 2017 American Medical Association. All rights reserved. The codes documented in this report are preliminary and upon coder review may  be revised to meet current compliance requirements. Juanita Craver, MD  Juanita Craver, MD 07/09/2018 7:44:20 AM This report has been signed electronically. Number of Addenda: 0

## 2018-07-09 NOTE — Discharge Instructions (Signed)

## 2018-07-09 NOTE — Anesthesia Postprocedure Evaluation (Signed)
Anesthesia Post Note  Patient: [name]  Procedure(s) Performed: COLONOSCOPY WITH PROPOFOL (N/A ) POLYPECTOMY     Patient location during evaluation: PACU Anesthesia Type: MAC Level of consciousness: awake and alert Pain management: pain level controlled Vital Signs Assessment: post-procedure vital signs reviewed and stable Respiratory status: spontaneous breathing, nonlabored ventilation and respiratory function stable Cardiovascular status: stable and blood pressure returned to baseline Postop Assessment: no apparent nausea or vomiting Anesthetic complications: no    Last Vitals:  Vitals:   07/09/18 0741 07/09/18 0754  BP: (!) 91/54 (!) 111/56  Pulse: 78 73  Resp: 13 16  Temp: 36.7 C   SpO2: 100% 94%    Last Pain:  Vitals:   07/09/18 0754  TempSrc:   PainSc: 0-No pain                 Catalina Gravel

## 2018-07-10 ENCOUNTER — Encounter (HOSPITAL_COMMUNITY): Payer: Self-pay | Admitting: Gastroenterology

## 2018-09-03 ENCOUNTER — Encounter: Payer: Self-pay | Admitting: Internal Medicine

## 2018-09-03 LAB — HM DIABETES EYE EXAM

## 2018-09-25 ENCOUNTER — Telehealth: Payer: Self-pay | Admitting: *Deleted

## 2018-09-25 NOTE — Telephone Encounter (Signed)
Medical Records faxed to [place] ([facility]); release [phone]

## 2018-10-01 ENCOUNTER — Telehealth: Payer: Self-pay | Admitting: *Deleted

## 2018-10-01 ENCOUNTER — Ambulatory Visit: Payer: BC Managed Care – PPO | Admitting: Internal Medicine

## 2018-10-01 NOTE — Telephone Encounter (Signed)
Medical records faxed to [place]; release [phone]

## 2018-10-01 NOTE — Telephone Encounter (Signed)
Medical records faxed to [place]; release [phone]

## 2018-10-08 ENCOUNTER — Ambulatory Visit (INDEPENDENT_AMBULATORY_CARE_PROVIDER_SITE_OTHER): Payer: Medicare Other | Admitting: Internal Medicine

## 2018-10-08 ENCOUNTER — Encounter: Payer: Self-pay | Admitting: Internal Medicine

## 2018-10-08 DIAGNOSIS — Z23 Encounter for immunization: Secondary | ICD-10-CM | POA: Diagnosis not present

## 2018-10-08 NOTE — Progress Notes (Signed)
Flu vaccine given by CMA 

## 2018-10-08 NOTE — Patient Instructions (Signed)
Patient received a flu vaccine IM L deltoid, AV, CMA  

## 2018-12-15 ENCOUNTER — Other Ambulatory Visit: Payer: Self-pay | Admitting: Internal Medicine

## 2018-12-15 DIAGNOSIS — Z1231 Encounter for screening mammogram for malignant neoplasm of breast: Secondary | ICD-10-CM

## 2019-01-18 ENCOUNTER — Ambulatory Visit: Payer: Medicare Other | Admitting: Internal Medicine

## 2019-01-18 ENCOUNTER — Encounter: Payer: Self-pay | Admitting: Internal Medicine

## 2019-01-18 VITALS — BP 130/90 | HR 82 | Temp 98.6°F | Ht 64.0 in | Wt 215.0 lb

## 2019-01-18 DIAGNOSIS — H1033 Unspecified acute conjunctivitis, bilateral: Secondary | ICD-10-CM | POA: Diagnosis not present

## 2019-01-18 DIAGNOSIS — J0100 Acute maxillary sinusitis, unspecified: Secondary | ICD-10-CM | POA: Diagnosis not present

## 2019-01-18 MED ORDER — AZITHROMYCIN 250 MG PO TABS: ORAL_TABLET | ORAL | 0 refills | Status: DC

## 2019-01-18 MED ORDER — OFLOXACIN 0.3 % OP SOLN: 1.0000 [drp] | Freq: Four times a day (QID) | OPHTHALMIC | 0 refills | Status: DC

## 2019-01-24 ENCOUNTER — Encounter: Payer: Self-pay | Admitting: Internal Medicine

## 2019-01-24 NOTE — Patient Instructions (Signed)
Zithromax Z-PAK take 2 tablets day 1 followed by 1 tablet days 2 through 5.  Ocuflox ophthalmic drops 2 drops in each eye 4 times a day for 5 days.  Rest and drink plenty of fluids.  It was a pleasure to see you today.  We hope you feel better soon.

## 2019-01-24 NOTE — Progress Notes (Signed)
   Subjective:    Patient ID: [name], female    DOB: July 14, 1951, 68 y.o.   MRN: [id number]  HPI 68 year old Female with history of chronic kidney disease and hypertension again with URI symptoms.  Has had flu vaccine.  Has had cough with some sputum production.  Has had maxillary sinus pressure.  Scratchy throat.  She is an insulin-dependent diabetic.    Review of Systems see above no shaking chills or significant myalgias.  Has noticed her eyes are irritated and red     Objective:   Physical Exam Skin warm and dry.  Pharynx very slightly injected without exudate.  TMs are clear.  Neck is supple.  Chest clear to auscultation.  She sounds nasally congested.  There is erythema of conjunctivae bilaterally.       Assessment & Plan:  Acute maxillary sinusitis  Acute bacterial conjunctivitis of both eyes  Plan: Zithromax Z-PAK take 2 tablets day 1 followed by 1 tablet days 2 through 5.  Ocuflox ophthalmic drops 2 drops in each eye 4 times a day for 5 days.

## 2019-01-28 ENCOUNTER — Ambulatory Visit
Admission: RE | Admit: 2019-01-28 | Discharge: 2019-01-28 | Disposition: A | Discharge status: Home / Self Care | Payer: Medicare Other | Source: Ambulatory Visit | Attending: Internal Medicine | Admitting: Internal Medicine

## 2019-01-28 DIAGNOSIS — Z1231 Encounter for screening mammogram for malignant neoplasm of breast: Secondary | ICD-10-CM

## 2019-01-28 IMAGING — MG DIGITAL SCREENING BILATERAL MAMMOGRAM WITH TOMO AND CAD
8 series · 8 of 24 positions shown · non-contrast
Comparison: Previous exam(s).

ACR Breast Density Category a: The breast tissue is almost entirely
fatty.

CLINICAL DATA: Screening.

EXAM:
DIGITAL SCREENING BILATERAL MAMMOGRAM WITH TOMO AND CAD

[L CC synth-2D]
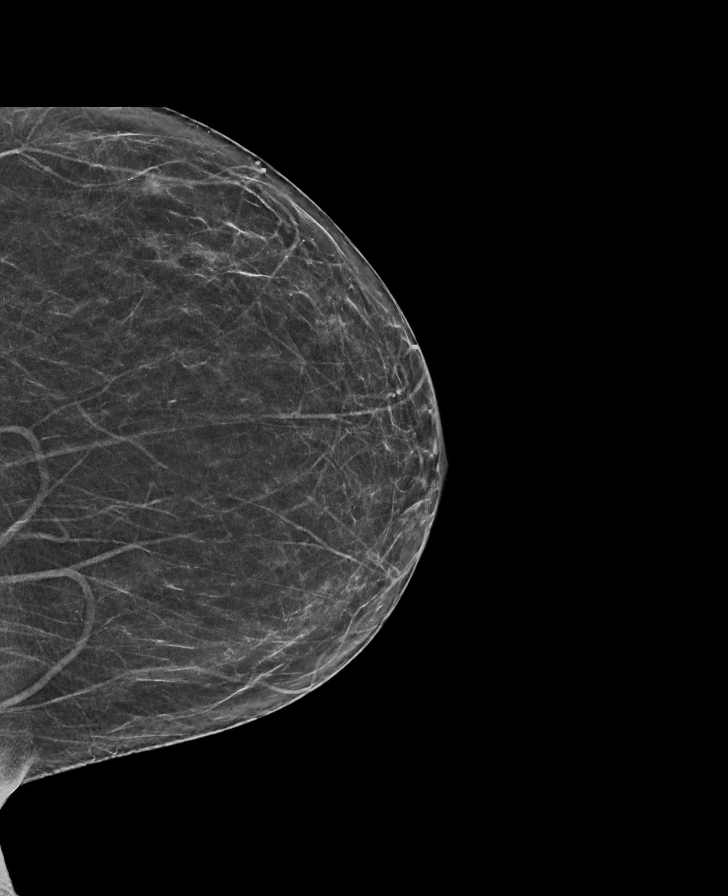

[R CC synth-2D]
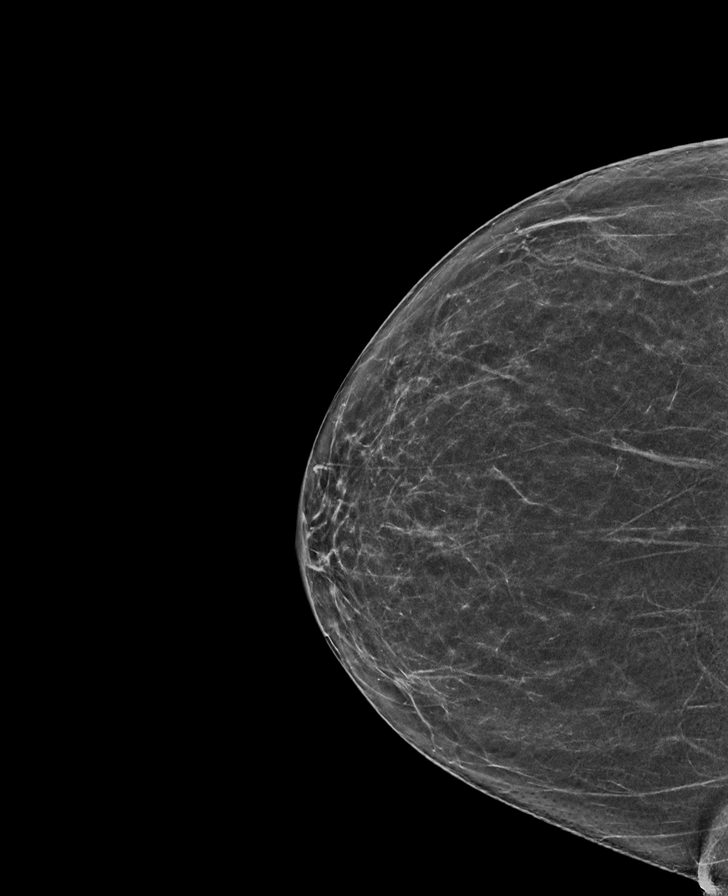

[R MLO synth-2D]
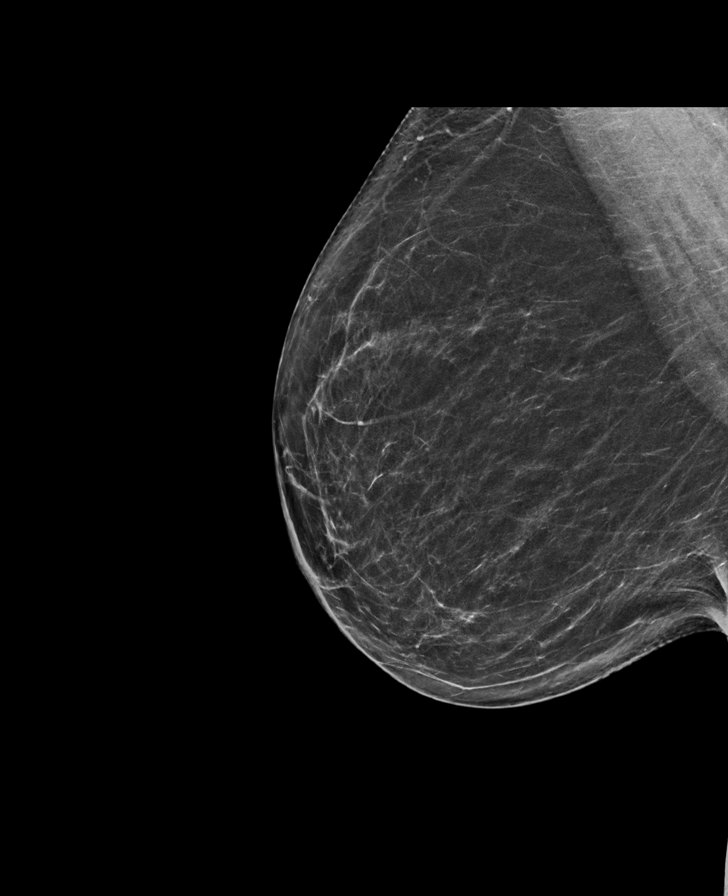

[L MLO synth-2D]
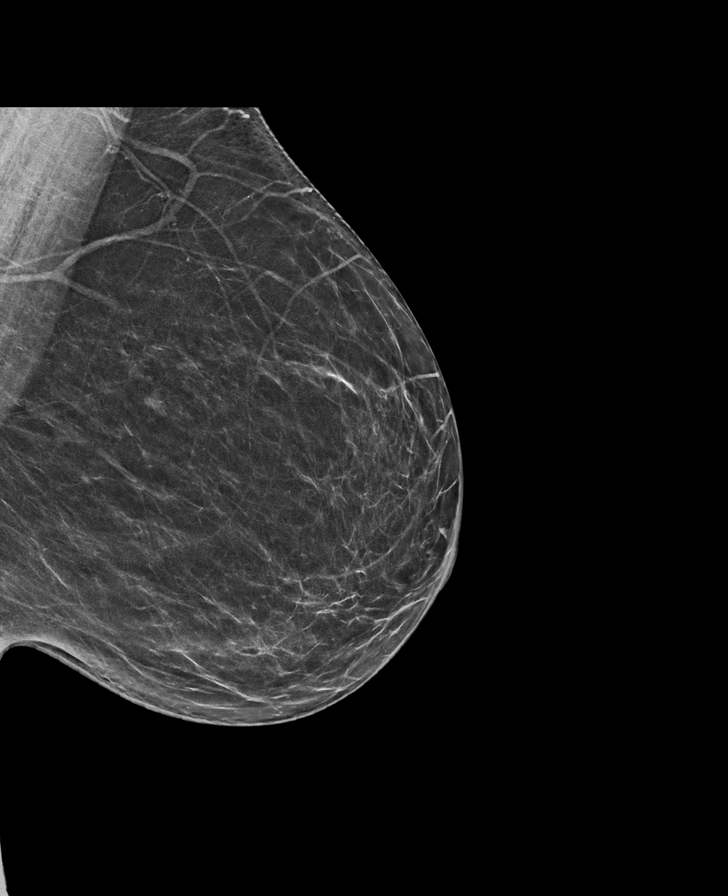

[L CC tomo · tomo slice 31/62.0]
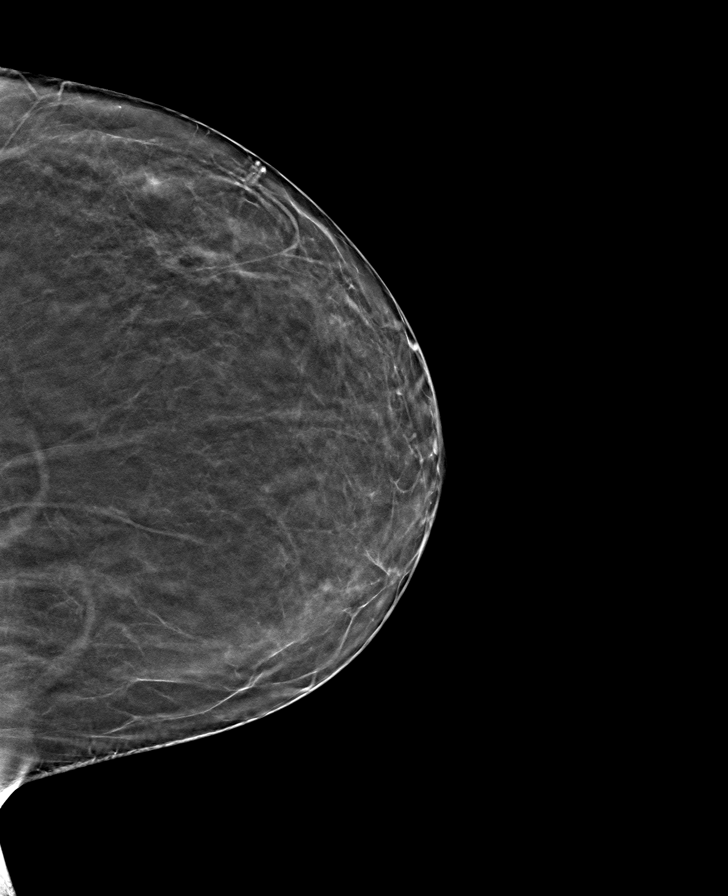

[R MLO tomo · tomo slice 39/76.0]
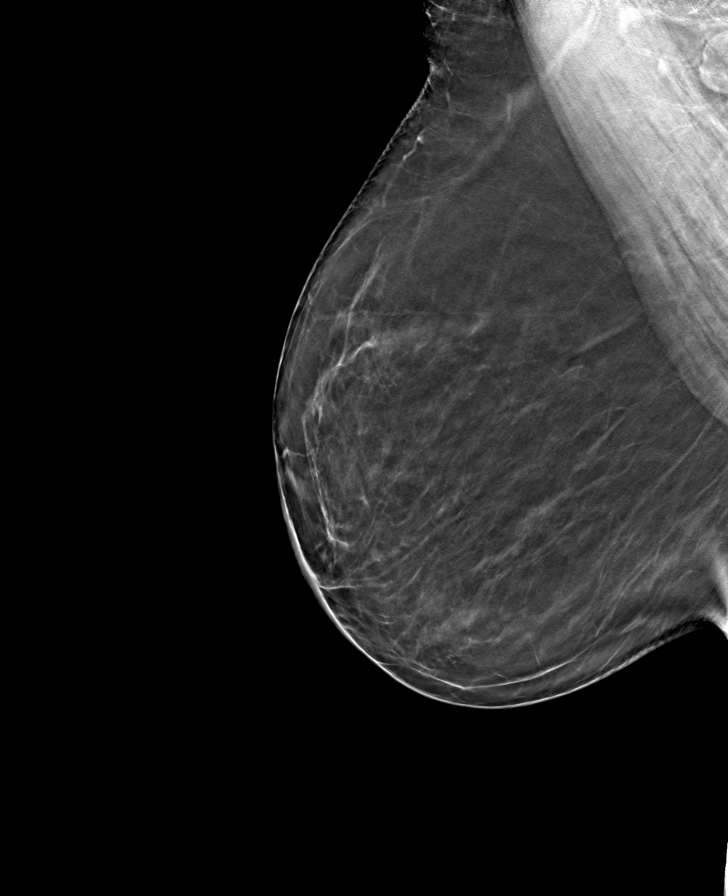

[R CC tomo · tomo slice 33/64.0]
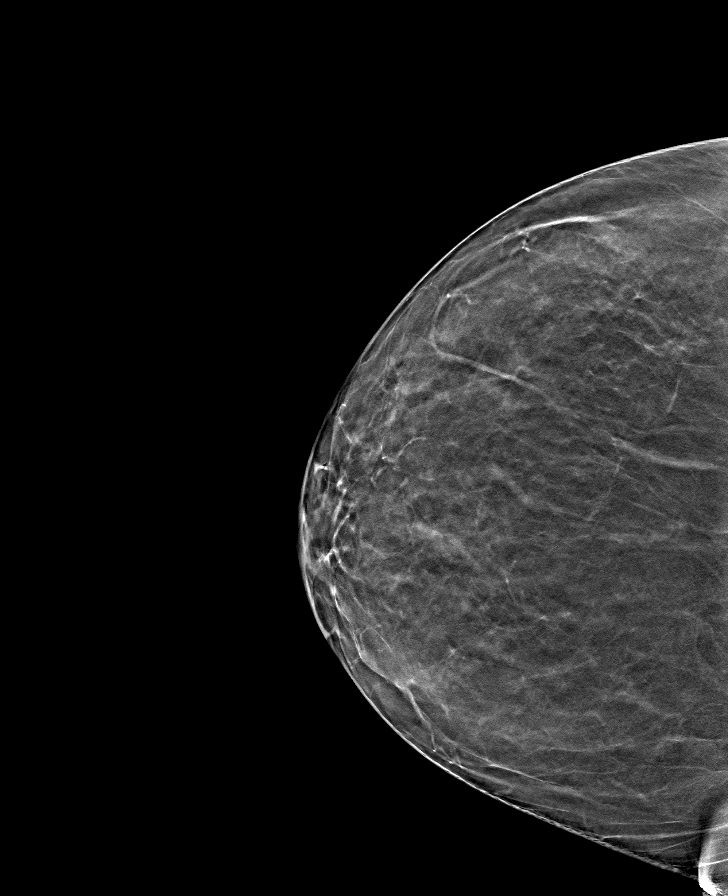

[L MLO tomo · tomo slice 36/71.0]
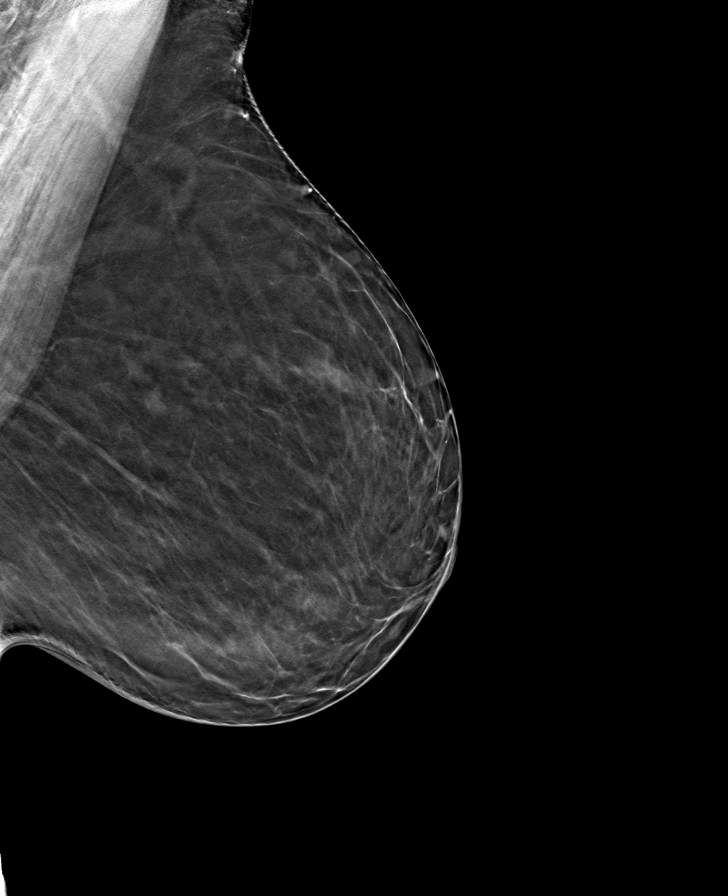

[8 of 24 positions shown; findings below may reference images not displayed]

FINDINGS: There are no findings suspicious for malignancy. Images were
processed with CAD.
IMPRESSION: No mammographic evidence of malignancy. A result letter of this
screening mammogram will be mailed directly to the patient.

RECOMMENDATION:
Screening mammogram in one year. (Code:[TA])

BI-RADS CATEGORY  1: Negative.

## 2019-03-04 ENCOUNTER — Other Ambulatory Visit (INDEPENDENT_AMBULATORY_CARE_PROVIDER_SITE_OTHER): Payer: Medicare Other | Admitting: Internal Medicine

## 2019-03-04 DIAGNOSIS — N184 Chronic kidney disease, stage 4 (severe): Secondary | ICD-10-CM

## 2019-03-04 DIAGNOSIS — Z6837 Body mass index (BMI) 37.0-37.9, adult: Secondary | ICD-10-CM

## 2019-03-04 DIAGNOSIS — Z0000 Encounter for general adult medical examination without abnormal findings: Secondary | ICD-10-CM | POA: Diagnosis not present

## 2019-03-04 DIAGNOSIS — E119 Type 2 diabetes mellitus without complications: Secondary | ICD-10-CM

## 2019-03-04 DIAGNOSIS — M109 Gout, unspecified: Secondary | ICD-10-CM

## 2019-03-04 DIAGNOSIS — E1169 Type 2 diabetes mellitus with other specified complication: Secondary | ICD-10-CM

## 2019-03-04 DIAGNOSIS — M8580 Other specified disorders of bone density and structure, unspecified site: Secondary | ICD-10-CM

## 2019-03-04 DIAGNOSIS — E781 Pure hyperglyceridemia: Secondary | ICD-10-CM

## 2019-03-04 DIAGNOSIS — K219 Gastro-esophageal reflux disease without esophagitis: Secondary | ICD-10-CM

## 2019-03-04 LAB — POCT URINALYSIS DIPSTICK
Appearance: NEGATIVE
Bilirubin, UA: NEGATIVE
Glucose, UA: NEGATIVE
Ketones, UA: NEGATIVE
Leukocytes, UA: NEGATIVE
Nitrite, UA: NEGATIVE
Odor: NEGATIVE
Protein, UA: POSITIVE — AB
Spec Grav, UA: 1.015 (ref 1.010–1.025)
Urobilinogen, UA: 0.2 E.U./dL
pH, UA: 6 (ref 5.0–8.0)

## 2019-03-04 NOTE — Addendum Note (Signed)
Addended by: Mady Haagensen on: 03/04/2019 10:15 AM   Modules accepted: Orders

## 2019-03-05 LAB — CBC WITH DIFFERENTIAL/PLATELET
Absolute Monocytes: 498 cells/uL (ref 200–950)
Basophils Absolute: 42 cells/uL (ref 0–200)
Basophils Relative: 0.7 %
Eosinophils Absolute: 318 cells/uL (ref 15–500)
Eosinophils Relative: 5.3 %
HCT: 41.2 % (ref 35.0–45.0)
Hemoglobin: 14 g/dL (ref 11.7–15.5)
Lymphs Abs: 1554 cells/uL (ref 850–3900)
MCH: 31.3 pg (ref 27.0–33.0)
MCHC: 34 g/dL (ref 32.0–36.0)
MCV: 92 fL (ref 80.0–100.0)
MPV: 9.1 fL (ref 7.5–12.5)
Monocytes Relative: 8.3 %
Neutro Abs: 3588 cells/uL (ref 1500–7800)
Neutrophils Relative %: 59.8 %
Platelets: 315 10*3/uL (ref 140–400)
RBC: 4.48 10*6/uL (ref 3.80–5.10)
RDW: 13.6 % (ref 11.0–15.0)
Total Lymphocyte: 25.9 %
WBC: 6 10*3/uL (ref 3.8–10.8)

## 2019-03-05 LAB — COMPLETE METABOLIC PANEL WITH GFR
AG Ratio: 1.6 (calc) (ref 1.0–2.5)
ALT: 19 U/L (ref 6–29)
AST: 20 U/L (ref 10–35)
Albumin: 4.1 g/dL (ref 3.6–5.1)
Alkaline phosphatase (APISO): 105 U/L (ref 37–153)
BUN/Creatinine Ratio: 21 (calc) (ref 6–22)
BUN: 44 mg/dL — ABNORMAL HIGH (ref 7–25)
CO2: 24 mmol/L (ref 20–32)
Calcium: 10 mg/dL (ref 8.6–10.4)
Chloride: 109 mmol/L (ref 98–110)
Creat: 2.13 mg/dL — ABNORMAL HIGH (ref 0.50–0.99)
GFR, Est African American: 27 mL/min/{1.73_m2} — ABNORMAL LOW (ref >=60)
GFR, Est Non African American: 24 mL/min/{1.73_m2} — ABNORMAL LOW (ref >=60)
Globulin: 2.6 g/dL (calc) (ref 1.9–3.7)
Glucose, Bld: 142 mg/dL — ABNORMAL HIGH (ref 65–99)
Potassium: 5.5 mmol/L — ABNORMAL HIGH (ref 3.5–5.3)
Sodium: 142 mmol/L (ref 135–146)
Total Bilirubin: 0.5 mg/dL (ref 0.2–1.2)
Total Protein: 6.7 g/dL (ref 6.1–8.1)

## 2019-03-05 LAB — TSH: TSH: 1.59 mIU/L (ref 0.40–4.50)

## 2019-03-05 LAB — HEMOGLOBIN A1C
Hgb A1c MFr Bld: 7.7 % of total Hgb — ABNORMAL HIGH (ref <5.7)
Mean Plasma Glucose: 174 (calc)
eAG (mmol/L): 9.7 (calc)

## 2019-03-05 LAB — MICROALBUMIN / CREATININE URINE RATIO
Creatinine, Urine: 85 mg/dL (ref 20–275)
Microalb Creat Ratio: 536 mcg/mg creat — ABNORMAL HIGH (ref <30)
Microalb, Ur: 45.6 mg/dL

## 2019-03-05 LAB — LIPID PANEL
Cholesterol: 206 mg/dL — ABNORMAL HIGH (ref <200)
HDL: 34 mg/dL — ABNORMAL LOW (ref >=50)
Non-HDL Cholesterol (Calc): 172 mg/dL (calc) — ABNORMAL HIGH (ref <130)
Total CHOL/HDL Ratio: 6.1 (calc) — ABNORMAL HIGH (ref <5.0)
Triglycerides: 418 mg/dL — ABNORMAL HIGH (ref <150)

## 2019-03-05 LAB — URIC ACID: Uric Acid, Serum: 5.7 mg/dL (ref 2.5–7.0)

## 2019-03-08 ENCOUNTER — Encounter: Payer: Self-pay | Admitting: Internal Medicine

## 2019-03-08 ENCOUNTER — Ambulatory Visit (INDEPENDENT_AMBULATORY_CARE_PROVIDER_SITE_OTHER): Payer: Medicare Other | Admitting: Internal Medicine

## 2019-03-08 ENCOUNTER — Other Ambulatory Visit (HOSPITAL_COMMUNITY)
Admission: RE | Admit: 2019-03-08 | Discharge: 2019-03-08 | Disposition: A | Discharge status: Home / Self Care | Payer: Medicare Other | Source: Ambulatory Visit | Attending: Internal Medicine | Admitting: Internal Medicine

## 2019-03-08 ENCOUNTER — Other Ambulatory Visit: Payer: Self-pay

## 2019-03-08 VITALS — BP 120/80 | HR 68 | Ht 64.0 in | Wt 208.0 lb

## 2019-03-08 DIAGNOSIS — C439 Malignant melanoma of skin, unspecified: Secondary | ICD-10-CM

## 2019-03-08 DIAGNOSIS — Z6835 Body mass index (BMI) 35.0-35.9, adult: Secondary | ICD-10-CM

## 2019-03-08 DIAGNOSIS — R8290 Unspecified abnormal findings in urine: Secondary | ICD-10-CM | POA: Diagnosis not present

## 2019-03-08 DIAGNOSIS — Z124 Encounter for screening for malignant neoplasm of cervix: Secondary | ICD-10-CM | POA: Insufficient documentation

## 2019-03-08 DIAGNOSIS — Z0000 Encounter for general adult medical examination without abnormal findings: Secondary | ICD-10-CM | POA: Diagnosis not present

## 2019-03-08 DIAGNOSIS — Z780 Asymptomatic menopausal state: Secondary | ICD-10-CM | POA: Insufficient documentation

## 2019-03-08 DIAGNOSIS — E119 Type 2 diabetes mellitus without complications: Secondary | ICD-10-CM | POA: Insufficient documentation

## 2019-03-08 DIAGNOSIS — Z1151 Encounter for screening for human papillomavirus (HPV): Secondary | ICD-10-CM | POA: Insufficient documentation

## 2019-03-08 DIAGNOSIS — K219 Gastro-esophageal reflux disease without esophagitis: Secondary | ICD-10-CM

## 2019-03-08 DIAGNOSIS — E875 Hyperkalemia: Secondary | ICD-10-CM | POA: Diagnosis not present

## 2019-03-08 DIAGNOSIS — E782 Mixed hyperlipidemia: Secondary | ICD-10-CM

## 2019-03-08 DIAGNOSIS — N184 Chronic kidney disease, stage 4 (severe): Secondary | ICD-10-CM

## 2019-03-08 DIAGNOSIS — E1169 Type 2 diabetes mellitus with other specified complication: Secondary | ICD-10-CM

## 2019-03-08 DIAGNOSIS — Z23 Encounter for immunization: Secondary | ICD-10-CM | POA: Diagnosis not present

## 2019-03-08 DIAGNOSIS — M810 Age-related osteoporosis without current pathological fracture: Secondary | ICD-10-CM

## 2019-03-08 LAB — POCT URINALYSIS DIPSTICK
Bilirubin, UA: NEGATIVE
Glucose, UA: NEGATIVE
Ketones, UA: NEGATIVE
Nitrite, UA: NEGATIVE
Protein, UA: POSITIVE — AB
Spec Grav, UA: 1.01 (ref 1.010–1.025)
Urobilinogen, UA: 0.2 E.U./dL
pH, UA: 6.5 (ref 5.0–8.0)

## 2019-03-08 MED ORDER — KETOCONAZOLE 2 % EX CREA: 1.0000 "application " | TOPICAL_CREAM | Freq: Every day | CUTANEOUS | 0 refills | Status: DC

## 2019-03-08 NOTE — Patient Instructions (Addendum)
It was a pleasure to see you today.  Return in 1 year or as needed.  Continue follow-up with Dr. Lorrene Reid and Dr. Chalmers Cater.  Watch diet.  Prevnar 13 given today.

## 2019-03-08 NOTE — Progress Notes (Deleted)
   Subjective:    Patient ID: [name], female    DOB: 28-Jan-1951, 68 y.o.   MRN: [id number]  HPI    Review of Systems     Objective:   Physical Exam        Assessment & Plan:

## 2019-03-08 NOTE — Progress Notes (Signed)
Subjective:    Patient ID: [name], female    DOB: July 06, 1951, 68 y.o.   MRN: [id number]  HPI  68 year old Female for Welcome to [facility],health maintenance exam and evaluation of medical issues.  Recently had melanoma removed left trunk  at [facility].  She says it was a superficial lesion.  Had flu vaccine in the Fall.  Dr. Chalmers Cater is Endocrinologist.  Dr. Chalmers Cater manages her lipids and diabetes.  Still sees Dr. Lorrene Reid at [facility] for chronic kidney disease. Also followed yearly at [facility] but is on inactive list for renal transplant.  Had tubular adenoma 03-25-18 on colonoscopy by Dr. Collene Mares.  Has osteoporosis but has not wanted to be on therapy.  Needs Prevnar 13. Dr. Lorrene Reid does not want her to have live virus vaccine such as Shingrix.  History of osteoporosis.  Dr. Lu Duffel said she can take Prolia but patient does not want to do that.  Has annual diabetic eye exam.  History of significant hypertriglyceridemia and is unable to tolerate TriCor because she apparently developed a fever as a side effect of this and it was discontinued in the summer 2012.  She used to get frequent sinus infections when she was working but since retiring she has not had days.  She is allergic to sulfa.  Says Altace has caused cough in the past.  History of GE reflux, allergic rhinitis, dependent edema, osteopenia, carpal tunnel syndrome Mar 25, 2005, ganglion cyst removed from right wrist 1959, C-section 03/25/92, D&C 1998.  Patient says her dry weight is 212 pounds  In 03-25-2006 she had right-sided flank pain and was thought to have nephrolithiasis however she was found to have an obstruction at the UP junction.  She underwent reconstructive surgery and had a ileal diversion.  Creatinine was mildly elevated at 1.5.  In January 2008 she was admitted with dehydration and renal insufficiency.  Creatinine initially was 1.6 but improved with hydration to 1.39.  She is was on antibiotics  for wound infection and had nausea and vomiting.  In July 2009 she was admitted with acute renal failure with creatinine up to 6.  She had a left ureteral stent placed.  Renogram showed poor function and poor clearance of the tracer from left kidney as well as increasing retention of the right kidney.  Retrograde pyelogram showed no evidence of obstruction.  It was never clear exactly what caused the acute renal failure.  It was thought to perhaps be due to acute on chronic injury.  There was possible contribution from obstruction and metformin.  Had hernia repair by Dr. Theda Sers at [facility] in 03/25/09.  An incisional ventral hernia repair with mesh December 2011 by Dr. Dossie Der at [facility].  Baseline creatinine was 2.24 at that point.  Social history: She is married.  One adult daughter who is a Lawyer in [place].  Does not smoke.  Occasional wine consumption.  Formally worked for [employer] but is now retired.  Family history: Father with history of aortic valve replacement, hyperlipidemia, rheumatic heart disease.  Mother with history of hypertension, hypothyroidism, hyperlipidemia, COPD.  One sister died in 03/25/04 with coronary artery disease and hyperlipidemia.  One sister living with hyperlipidemia.  Review of Systems     Objective:   Physical Exam Vitals signs reviewed.  Constitutional:      General: She is not in acute distress.    Appearance: Normal appearance.  HENT:     Head: Normocephalic.  Right Ear: Tympanic membrane normal.     Left Ear: Tympanic membrane normal.     Nose: Nose normal.     Mouth/Throat:     Mouth: Mucous membranes are moist.     Pharynx: Oropharynx is clear.  Eyes:     General: No scleral icterus.    Extraocular Movements: Extraocular movements intact.     Pupils: Pupils are equal, round, and reactive to light.  Neck:     Musculoskeletal: Neck supple. No neck rigidity.     Comments: No thyromegaly Cardiovascular:     Rate and  Rhythm: Normal rate and regular rhythm.     Heart sounds: No murmur.  Pulmonary:     Effort: Pulmonary effort is normal. No respiratory distress.     Breath sounds: Normal breath sounds. No wheezing or rales.  Abdominal:     General: Bowel sounds are normal.     Palpations: There is no mass.     Tenderness: There is no rebound.  Genitourinary:    Comments: Pap taken.  Bimanual normal.  Rectovaginal confirms. Musculoskeletal:     Right lower leg: No edema.     Left lower leg: No edema.  Lymphadenopathy:     Cervical: No cervical adenopathy.  Skin:    General: Skin is warm and dry.     Findings: No lesion.  Neurological:     General: No focal deficit present.     Mental Status: She is alert and oriented to person, place, and time.     Cranial Nerves: No cranial nerve deficit.  Psychiatric:        Mood and Affect: Mood normal.        Behavior: Behavior normal.        Thought Content: Thought content normal.           Assessment & Plan:  History of recent melanoma treated at [facility] dermatology-was superficial and is being monitored   Chronic kidney disease stage IV followed by Dr. Lorrene Reid and creatinine is 2.13  Controlled type 2 diabetes mellitus followed by Dr. Chalmers Cater endocrinologist and hemoglobin A1c is 7.7%  Hypertriglyceridemia-intolerant of TriCor  Essential hypertension-stable  Metabolic syndrome  History of gout and uric acid is 5.7  History of vitamin D deficiency  Medical issues are stable.  Always has hypertriglyceridemia.  Triglycerides of 418, total cholesterol 206 and LDL cholesterol 172  Plan: Continue current medications and follow-up in 1 year or as needed.  She will continue to see endocrinologist and nephrologist.  Her potassium needs to be repeated as it is 5.5  Addendum: Repeat potassium on March 10 was 5.0.  Subjective:   Patient presents for Medicare Annual/Subsequent preventive examination.  Review Past Medical/Family/Social: See  above   Risk Factors  Current exercise habits: Walks some Dietary issues discussed: Low-fat low carbohydrate  Cardiac risk factors: Family history and hyper lipidemia as well as diabetes  Depression Screen  (Note: if answer to either of the following is "Yes", a more complete depression screening is indicated)   Over the past two weeks, have you felt down, depressed or hopeless? No  Over the past two weeks, have you felt little interest or pleasure in doing things? No Have you lost interest or pleasure in daily life? No Do you often feel hopeless? No Do you cry easily over simple problems? No   Activities of Daily Living  In your present state of health, do you have any difficulty performing the following activities?:   Driving? No  Managing money? No  Feeding yourself? No  Getting from bed to chair? No  Climbing a flight of stairs? No  Preparing food and eating?: No  Bathing or showering? No  Getting dressed: No  Getting to the toilet? No  Using the toilet:No  Moving around from place to place: No  In the past year have you fallen or had a near fall?:No  Are you sexually active? No  Do you have more than one partner? No   Hearing Difficulties: No  Do you often ask people to speak up or repeat themselves? No  Do you experience ringing or noises in your ears? No  Do you have difficulty understanding soft or whispered voices? No  Do you feel that you have a problem with memory? No Do you often misplace items? No    Home Safety:  Do you have a smoke alarm at your residence? Yes Do you have grab bars in the bathroom?  None  do you have throw rugs in your house?  None   Cognitive Testing  Alert? Yes Normal Appearance?Yes  Oriented to person? Yes Place? Yes  Time? Yes  Recall of three objects? Yes  Can perform simple calculations? Yes  Displays appropriate judgment?Yes  Can read the correct time from a watch face?Yes   List the Names of Other  Physician/Practitioners you currently use:  See referral list for the physicians patient is currently seeing.  Endocrinologist  Nephrologist  Dermatologist    Review of Systems: See above   Objective:     General appearance: Appears stated age and mildly obese  Head: Normocephalic, without obvious abnormality, atraumatic  Eyes: conj clear, EOMi PEERLA  Ears: normal TM's and external ear canals both ears  Nose: Nares normal. Septum midline. Mucosa normal. No drainage or sinus tenderness.  Throat: lips, mucosa, and tongue normal; teeth and gums normal  Neck: no adenopathy, no carotid bruit, no JVD, supple, symmetrical, trachea midline and thyroid not enlarged, symmetric, no tenderness/mass/nodules  No CVA tenderness.  Lungs: clear to auscultation bilaterally  Breasts: normal appearance, no masses or tenderness Heart: regular rate and rhythm, S1, S2 normal, no murmur, click, rub or gallop  Abdomen: soft, non-tender; bowel sounds normal; no masses, no organomegaly  Musculoskeletal: ROM normal in all joints, no crepitus, no deformity, Normal muscle strengthen. Back  is symmetric, no curvature. Skin: Skin color, texture, turgor normal. No rashes or lesions  Lymph nodes: Cervical, supraclavicular, and axillary nodes normal.  Neurologic: CN 2 -12 Normal, Normal symmetric reflexes. Normal coordination and gait  Psych: Alert & Oriented x 3, Mood appear stable.    Assessment:    Annual wellness medicare exam   Plan:    During the course of the visit the patient was educated and counseled about appropriate screening and preventive services including:   Pneumococcal vaccine     Patient Instructions (the written plan) was given to the patient.  Medicare Attestation  I have personally reviewed:  The patient's medical and social history  Their use of alcohol, tobacco or illicit drugs  Their current medications and supplements  The patient's functional ability including ADLs,fall  risks, home safety risks, cognitive, and hearing and visual impairment  Diet and physical activities  Evidence for depression or mood disorders  The patient's weight, height, BMI, and visual acuity have been recorded in the chart. I have made referrals, counseling, and provided education to the patient based on review of the above and I have provided the patient with a  written personalized care plan for preventive services.

## 2019-03-09 ENCOUNTER — Other Ambulatory Visit: Payer: Medicare Other | Admitting: Internal Medicine

## 2019-03-09 DIAGNOSIS — E875 Hyperkalemia: Secondary | ICD-10-CM

## 2019-03-09 LAB — URINE CULTURE
MICRO NUMBER:: 293767
SPECIMEN QUALITY:: ADEQUATE

## 2019-03-09 LAB — POTASSIUM: Potassium: 5 mmol/L (ref 3.5–5.3)

## 2019-03-10 LAB — CYTOLOGY - PAP
Adequacy: ABSENT
Diagnosis: NEGATIVE
HPV: NOT DETECTED

## 2019-03-15 ENCOUNTER — Telehealth: Payer: Self-pay | Admitting: Internal Medicine

## 2019-03-15 NOTE — Telephone Encounter (Signed)
[name] [phone] -- Cell [phone]  Jadon called to say that the spot where she got the PNA shot got real hard after shot, then got red and hot like it had fever in it. It is better but still read. Also the place where she had a mole removed 6 weeks ago, she hit on Saturday and some liquid came out of it, should she worry about that or is it okay.

## 2019-03-15 NOTE — Telephone Encounter (Signed)
This is not an unusual reaction to pneumonia vaccine. Should be OK. Can uses warm hot compress on arm 20 minutes 2-3 times daily. With regard to mole, Clean with peroxide and apply antibacterial ointment twice a day for 5 days.

## 2019-03-15 NOTE — Telephone Encounter (Signed)
Pt was notified of Dr. Baxley's instructions, pt verbalized understanding.   

## 2019-05-21 ENCOUNTER — Telehealth: Payer: Self-pay

## 2019-05-21 DIAGNOSIS — G4733 Obstructive sleep apnea (adult) (pediatric): Secondary | ICD-10-CM

## 2019-05-21 NOTE — Telephone Encounter (Signed)
Spoke with pt, she needs a travel CPAP. She wants to pick up the actual prescription so we would have to print it out. Ok to print Rx for pt? Please advise.   Patient Instructions by [name], MD at 07/07/2018 11:15 AM  Author: [name], MD Author Type: Physician Filed: 07/07/2018 11:32 AM  Note Status: Signed Cosign: Cosign Not Required Encounter Date: 07/07/2018  Editor: [name], MD (Physician)    Call if you need an order to get a different CPAP mask  Follow up in 1 year    Instructions      Return in about 1 year (around 07/08/2019).  Call if you need an order to get a different CPAP mask  Follow up in 1 year       After Visit Summary (Printed 07/07/2018)

## 2019-05-21 NOTE — Telephone Encounter (Signed)
Ok Rx for CPAP printed. Placed up front to pick up and nothing further is needed.

## 2019-05-21 NOTE — Telephone Encounter (Signed)
That's fine can we print and I'll sign

## 2019-09-09 ENCOUNTER — Encounter: Payer: Self-pay | Admitting: Internal Medicine

## 2019-09-09 LAB — HM DIABETES EYE EXAM

## 2019-09-22 ENCOUNTER — Encounter: Payer: Self-pay | Admitting: Internal Medicine

## 2019-09-22 ENCOUNTER — Other Ambulatory Visit: Payer: Self-pay

## 2019-09-22 ENCOUNTER — Ambulatory Visit (INDEPENDENT_AMBULATORY_CARE_PROVIDER_SITE_OTHER): Payer: Medicare Other | Admitting: Internal Medicine

## 2019-09-22 DIAGNOSIS — Z23 Encounter for immunization: Secondary | ICD-10-CM

## 2019-09-22 NOTE — Patient Instructions (Signed)
Patient received a flu vaccine IM L deltoid, AV, CMA  

## 2019-09-22 NOTE — Progress Notes (Signed)
Flu vaccine per CMA 

## 2019-09-29 ENCOUNTER — Other Ambulatory Visit: Payer: Self-pay

## 2019-09-29 DIAGNOSIS — M810 Age-related osteoporosis without current pathological fracture: Secondary | ICD-10-CM

## 2019-10-05 ENCOUNTER — Other Ambulatory Visit: Payer: Self-pay | Admitting: Internal Medicine

## 2019-10-05 DIAGNOSIS — Z1231 Encounter for screening mammogram for malignant neoplasm of breast: Secondary | ICD-10-CM

## 2020-01-13 DIAGNOSIS — N184 Chronic kidney disease, stage 4 (severe): Secondary | ICD-10-CM | POA: Diagnosis not present

## 2020-01-18 DIAGNOSIS — E875 Hyperkalemia: Secondary | ICD-10-CM | POA: Diagnosis not present

## 2020-01-18 DIAGNOSIS — E1122 Type 2 diabetes mellitus with diabetic chronic kidney disease: Secondary | ICD-10-CM | POA: Diagnosis not present

## 2020-01-18 DIAGNOSIS — Z794 Long term (current) use of insulin: Secondary | ICD-10-CM | POA: Diagnosis not present

## 2020-01-18 DIAGNOSIS — N2581 Secondary hyperparathyroidism of renal origin: Secondary | ICD-10-CM | POA: Diagnosis not present

## 2020-01-18 DIAGNOSIS — N2589 Other disorders resulting from impaired renal tubular function: Secondary | ICD-10-CM | POA: Diagnosis not present

## 2020-01-18 DIAGNOSIS — E785 Hyperlipidemia, unspecified: Secondary | ICD-10-CM | POA: Diagnosis not present

## 2020-01-18 DIAGNOSIS — N184 Chronic kidney disease, stage 4 (severe): Secondary | ICD-10-CM | POA: Diagnosis not present

## 2020-01-18 DIAGNOSIS — I129 Hypertensive chronic kidney disease with stage 1 through stage 4 chronic kidney disease, or unspecified chronic kidney disease: Secondary | ICD-10-CM | POA: Diagnosis not present

## 2020-01-20 ENCOUNTER — Ambulatory Visit: Payer: Medicare PPO | Attending: Internal Medicine

## 2020-01-20 DIAGNOSIS — Z23 Encounter for immunization: Secondary | ICD-10-CM

## 2020-01-20 NOTE — Progress Notes (Signed)
   Covid-19 Vaccination Clinic  Name:  Atina Feeley    MRN: [id number] DOB: [birth year]  01/20/2020  Ms. Haughn was observed post Covid-19 immunization for 15 minutes without incidence. She was provided with Vaccine Information Sheet and instruction to access the V-Safe system.   Ms. Roedl was instructed to call 911 with any severe reactions post vaccine: Marland Kitchen Difficulty breathing  . Swelling of your face and throat  . A fast heartbeat  . A bad rash all over your body  . Dizziness and weakness    Immunizations Administered    Name Date Dose VIS Date Route   Pfizer COVID-19 Vaccine 01/20/2020 12:43 PM 0.3 mL 12/10/2019 Intramuscular   Manufacturer: [place]   Lot: [id number]   [place]: [address]-8

## 2020-02-01 ENCOUNTER — Ambulatory Visit
Admission: RE | Admit: 2020-02-01 | Discharge: 2020-02-01 | Disposition: A | Discharge status: Home / Self Care | Payer: Medicare Other | Source: Ambulatory Visit | Attending: Internal Medicine | Admitting: Internal Medicine

## 2020-02-01 ENCOUNTER — Other Ambulatory Visit: Payer: Self-pay

## 2020-02-01 ENCOUNTER — Ambulatory Visit
Admission: RE | Admit: 2020-02-01 | Discharge: 2020-02-01 | Disposition: A | Discharge status: Home / Self Care | Payer: Medicare PPO | Source: Ambulatory Visit | Attending: Internal Medicine | Admitting: Internal Medicine

## 2020-02-01 DIAGNOSIS — M810 Age-related osteoporosis without current pathological fracture: Secondary | ICD-10-CM

## 2020-02-01 DIAGNOSIS — E875 Hyperkalemia: Secondary | ICD-10-CM | POA: Diagnosis not present

## 2020-02-01 DIAGNOSIS — Z780 Asymptomatic menopausal state: Secondary | ICD-10-CM | POA: Diagnosis not present

## 2020-02-01 DIAGNOSIS — Z1231 Encounter for screening mammogram for malignant neoplasm of breast: Secondary | ICD-10-CM | POA: Diagnosis not present

## 2020-02-01 DIAGNOSIS — M85852 Other specified disorders of bone density and structure, left thigh: Secondary | ICD-10-CM | POA: Diagnosis not present

## 2020-02-01 IMAGING — MG DIGITAL SCREENING BILAT W/ TOMO W/ CAD
8 series · 8 of 24 positions shown · non-contrast
Comparison: Previous exam(s).

ACR Breast Density Category a: The breast tissue is almost entirely
fatty.

CLINICAL DATA: Screening.

EXAM:
DIGITAL SCREENING BILATERAL MAMMOGRAM WITH TOMO AND CAD

[R MLO synth-2D]
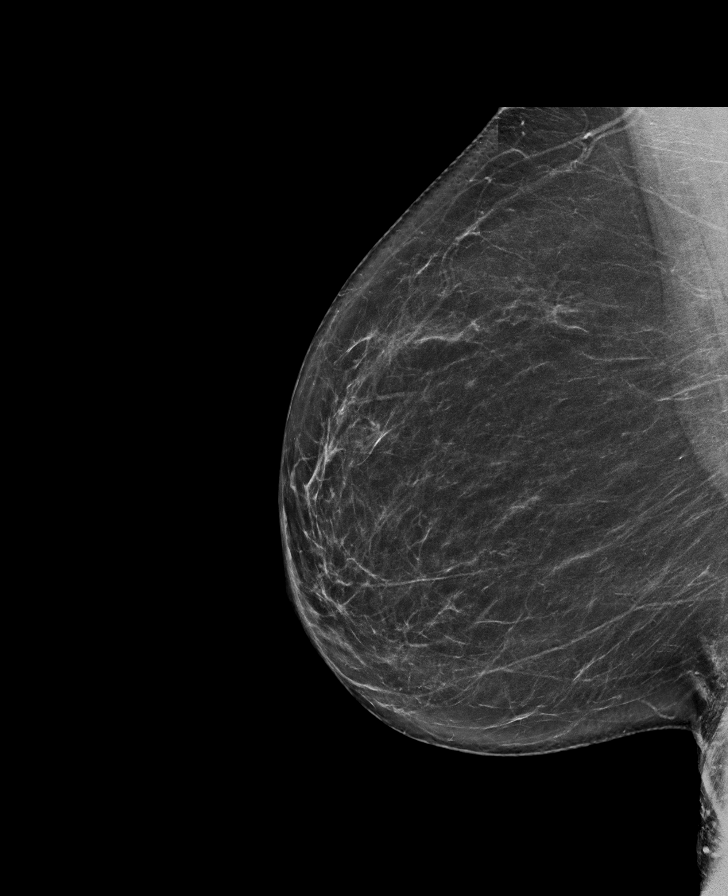

[L CC synth-2D]
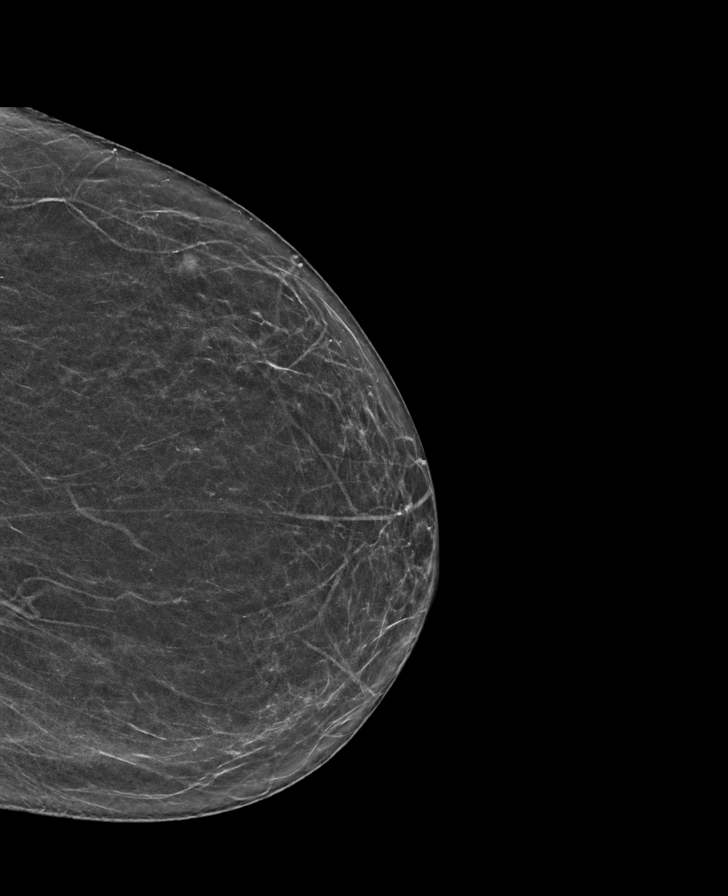

[R CC synth-2D]
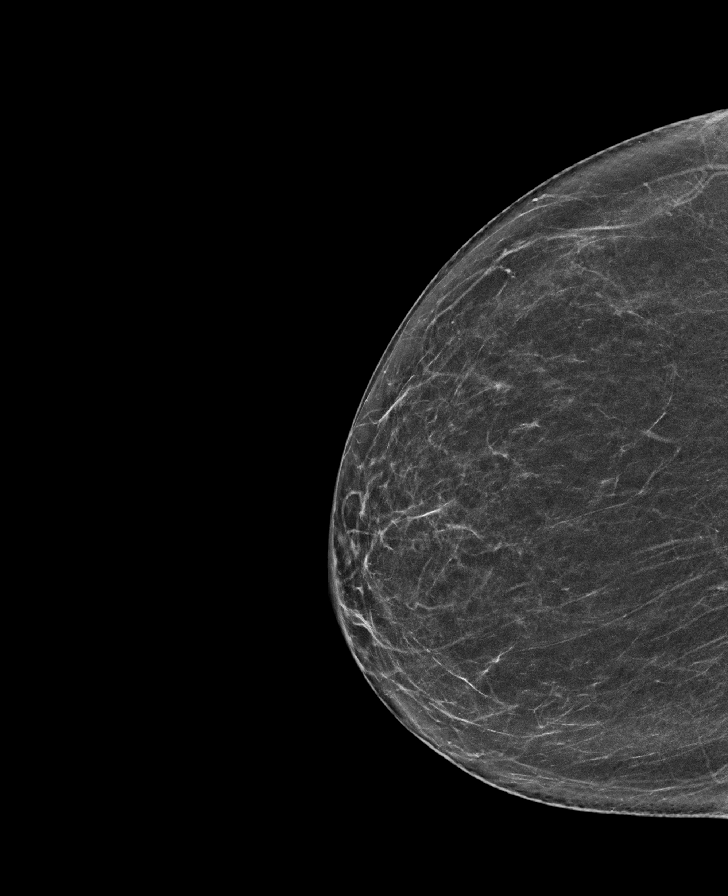

[L MLO synth-2D]
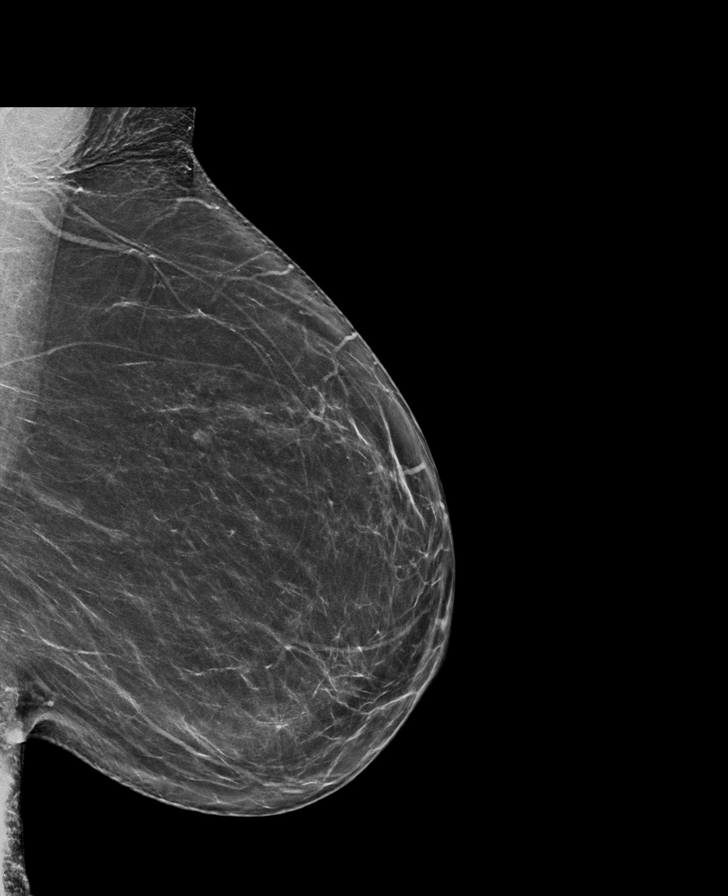

[R MLO tomo · tomo slice 38/75.0]
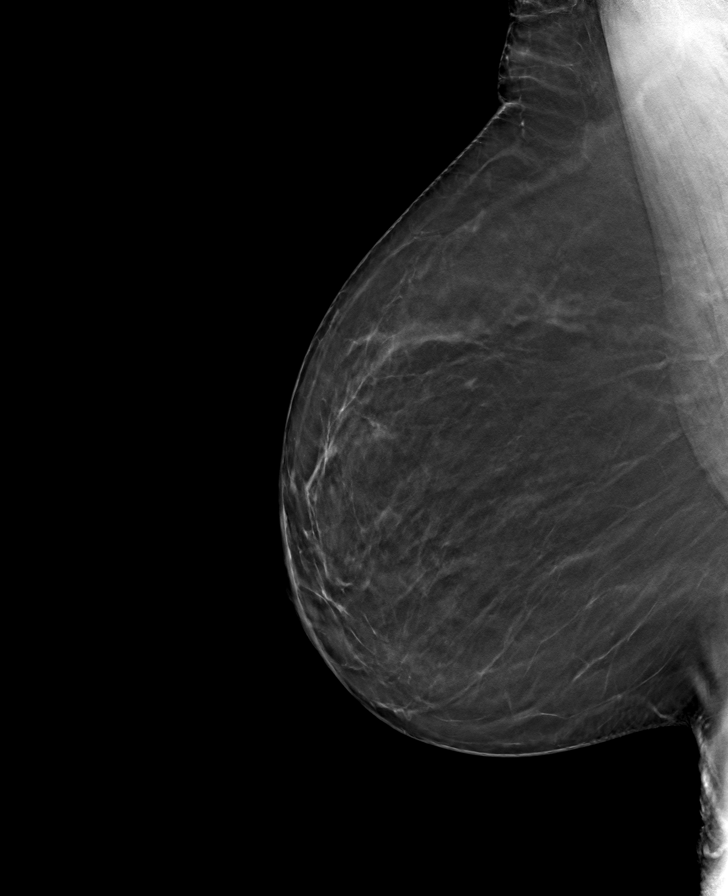

[L MLO tomo · tomo slice 37/73.0]
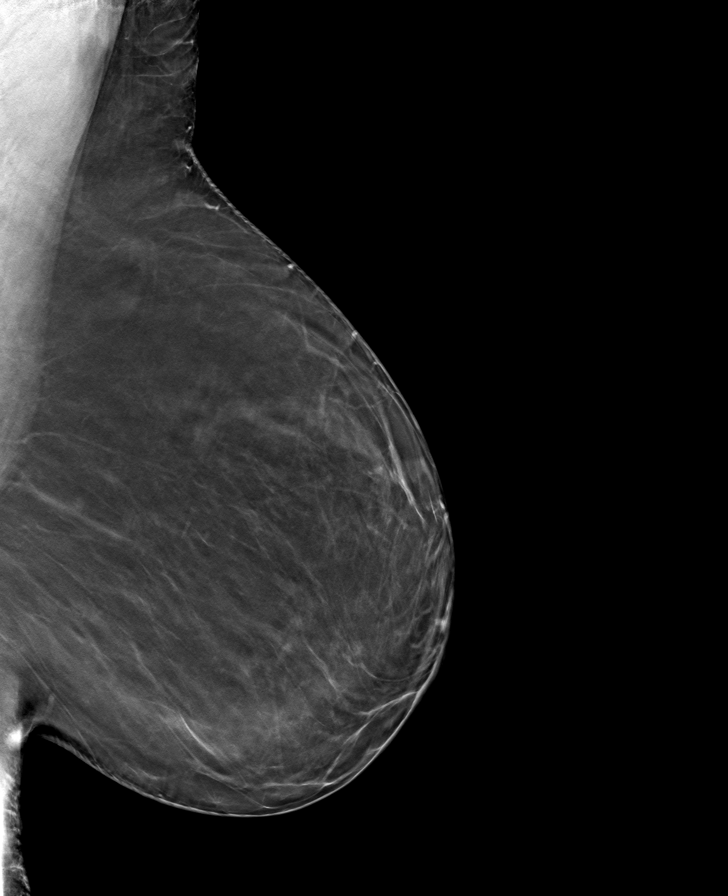

[R CC tomo · tomo slice 33/66.0]
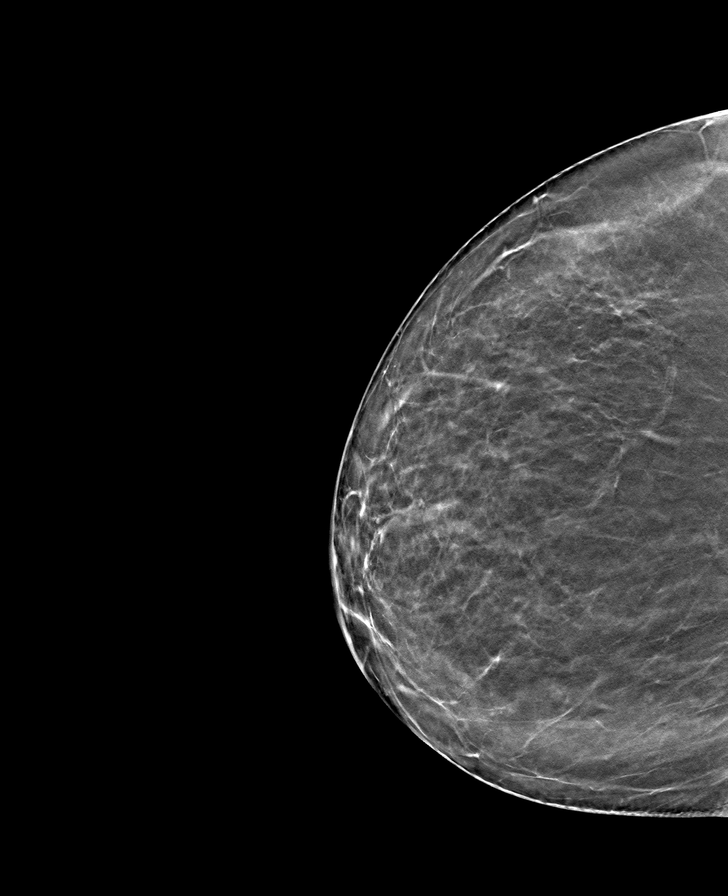

[L CC tomo · tomo slice 31/61.0]
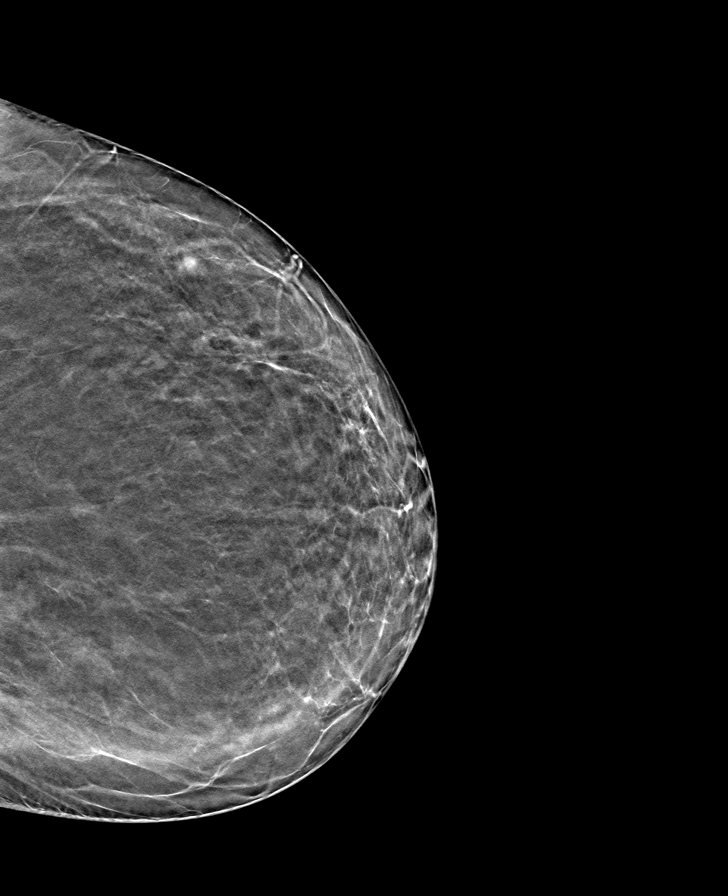

[8 of 24 positions shown; findings below may reference images not displayed]

FINDINGS: There are no findings suspicious for malignancy. Images were
processed with CAD.
IMPRESSION: No mammographic evidence of malignancy. A result letter of this
screening mammogram will be mailed directly to the patient.

RECOMMENDATION:
Screening mammogram in one year. (Code:[TA])

BI-RADS CATEGORY  1: Negative.

## 2020-02-04 DIAGNOSIS — Z6837 Body mass index (BMI) 37.0-37.9, adult: Secondary | ICD-10-CM | POA: Diagnosis not present

## 2020-02-04 DIAGNOSIS — D0359 Melanoma in situ of other part of trunk: Secondary | ICD-10-CM | POA: Diagnosis not present

## 2020-02-04 DIAGNOSIS — E669 Obesity, unspecified: Secondary | ICD-10-CM | POA: Diagnosis not present

## 2020-02-04 DIAGNOSIS — M1A09X0 Idiopathic chronic gout, multiple sites, without tophus (tophi): Secondary | ICD-10-CM | POA: Diagnosis not present

## 2020-02-04 DIAGNOSIS — M65332 Trigger finger, left middle finger: Secondary | ICD-10-CM | POA: Diagnosis not present

## 2020-02-04 DIAGNOSIS — N184 Chronic kidney disease, stage 4 (severe): Secondary | ICD-10-CM | POA: Diagnosis not present

## 2020-02-04 DIAGNOSIS — M25561 Pain in right knee: Secondary | ICD-10-CM | POA: Diagnosis not present

## 2020-02-04 DIAGNOSIS — M65341 Trigger finger, right ring finger: Secondary | ICD-10-CM | POA: Diagnosis not present

## 2020-02-10 ENCOUNTER — Ambulatory Visit: Payer: Medicare PPO | Attending: Internal Medicine

## 2020-02-10 DIAGNOSIS — Z23 Encounter for immunization: Secondary | ICD-10-CM

## 2020-02-10 NOTE — Progress Notes (Signed)
   Covid-19 Vaccination Clinic  Name:  [name]    MRN: [id number] DOB: 08-25-1951  02/10/2020  [name] was observed post Covid-19 immunization for 15 minutes without incidence. She was provided with Vaccine Information Sheet and instruction to access the V-Safe system.   [name] was instructed to call 911 with any severe reactions post vaccine: Marland Kitchen Difficulty breathing  . Swelling of your face and throat  . A fast heartbeat  . A bad rash all over your body  . Dizziness and weakness    Immunizations Administered    Name Date Dose VIS Date Route   Pfizer COVID-19 Vaccine 02/10/2020  1:20 PM 0.3 mL 12/10/2019 Intramuscular   Manufacturer: [place]   Lot: [id number]   [place]: [address]-1

## 2020-02-17 DIAGNOSIS — G4733 Obstructive sleep apnea (adult) (pediatric): Secondary | ICD-10-CM | POA: Diagnosis not present

## 2020-02-29 DIAGNOSIS — L821 Other seborrheic keratosis: Secondary | ICD-10-CM | POA: Diagnosis not present

## 2020-02-29 DIAGNOSIS — Z85820 Personal history of malignant melanoma of skin: Secondary | ICD-10-CM | POA: Diagnosis not present

## 2020-02-29 DIAGNOSIS — D225 Melanocytic nevi of trunk: Secondary | ICD-10-CM | POA: Diagnosis not present

## 2020-02-29 DIAGNOSIS — Z23 Encounter for immunization: Secondary | ICD-10-CM | POA: Diagnosis not present

## 2020-02-29 DIAGNOSIS — L570 Actinic keratosis: Secondary | ICD-10-CM | POA: Diagnosis not present

## 2020-02-29 DIAGNOSIS — L814 Other melanin hyperpigmentation: Secondary | ICD-10-CM | POA: Diagnosis not present

## 2020-02-29 DIAGNOSIS — L309 Dermatitis, unspecified: Secondary | ICD-10-CM | POA: Diagnosis not present

## 2020-02-29 DIAGNOSIS — Z808 Family history of malignant neoplasm of other organs or systems: Secondary | ICD-10-CM | POA: Diagnosis not present

## 2020-03-07 ENCOUNTER — Other Ambulatory Visit: Payer: Medicare PPO | Admitting: Internal Medicine

## 2020-03-07 ENCOUNTER — Other Ambulatory Visit: Payer: Self-pay

## 2020-03-07 DIAGNOSIS — N184 Chronic kidney disease, stage 4 (severe): Secondary | ICD-10-CM | POA: Diagnosis not present

## 2020-03-07 DIAGNOSIS — K219 Gastro-esophageal reflux disease without esophagitis: Secondary | ICD-10-CM

## 2020-03-07 DIAGNOSIS — E1169 Type 2 diabetes mellitus with other specified complication: Secondary | ICD-10-CM

## 2020-03-07 DIAGNOSIS — M810 Age-related osteoporosis without current pathological fracture: Secondary | ICD-10-CM

## 2020-03-07 DIAGNOSIS — Z0000 Encounter for general adult medical examination without abnormal findings: Secondary | ICD-10-CM | POA: Diagnosis not present

## 2020-03-07 DIAGNOSIS — E118 Type 2 diabetes mellitus with unspecified complications: Secondary | ICD-10-CM

## 2020-03-07 DIAGNOSIS — I10 Essential (primary) hypertension: Secondary | ICD-10-CM

## 2020-03-07 DIAGNOSIS — C439 Malignant melanoma of skin, unspecified: Secondary | ICD-10-CM | POA: Diagnosis not present

## 2020-03-07 DIAGNOSIS — E782 Mixed hyperlipidemia: Secondary | ICD-10-CM

## 2020-03-07 DIAGNOSIS — M1A09X0 Idiopathic chronic gout, multiple sites, without tophus (tophi): Secondary | ICD-10-CM

## 2020-03-07 NOTE — Addendum Note (Signed)
Addended by: Mady Haagensen on: 03/07/2020 09:48 AM   Modules accepted: Orders

## 2020-03-08 LAB — COMPLETE METABOLIC PANEL WITH GFR
AG Ratio: 1.4 (calc) (ref 1.0–2.5)
ALT: 24 U/L (ref 6–29)
AST: 22 U/L (ref 10–35)
Albumin: 3.9 g/dL (ref 3.6–5.1)
Alkaline phosphatase (APISO): 112 U/L (ref 37–153)
BUN/Creatinine Ratio: 25 (calc) — ABNORMAL HIGH (ref 6–22)
BUN: 49 mg/dL — ABNORMAL HIGH (ref 7–25)
CO2: 23 mmol/L (ref 20–32)
Calcium: 10 mg/dL (ref 8.6–10.4)
Chloride: 109 mmol/L (ref 98–110)
Creat: 1.99 mg/dL — ABNORMAL HIGH (ref 0.50–0.99)
GFR, Est African American: 30 mL/min/{1.73_m2} — ABNORMAL LOW (ref >=60)
GFR, Est Non African American: 26 mL/min/{1.73_m2} — ABNORMAL LOW (ref >=60)
Globulin: 2.7 g/dL (calc) (ref 1.9–3.7)
Glucose, Bld: 177 mg/dL — ABNORMAL HIGH (ref 65–99)
Potassium: 5.5 mmol/L — ABNORMAL HIGH (ref 3.5–5.3)
Sodium: 142 mmol/L (ref 135–146)
Total Bilirubin: 0.4 mg/dL (ref 0.2–1.2)
Total Protein: 6.6 g/dL (ref 6.1–8.1)

## 2020-03-08 LAB — CBC WITH DIFFERENTIAL/PLATELET
Absolute Monocytes: 564 cells/uL (ref 200–950)
Basophils Absolute: 50 cells/uL (ref 0–200)
Basophils Relative: 0.8 %
Eosinophils Absolute: 223 cells/uL (ref 15–500)
Eosinophils Relative: 3.6 %
HCT: 43 % (ref 35.0–45.0)
Hemoglobin: 14.7 g/dL (ref 11.7–15.5)
Lymphs Abs: 1445 cells/uL (ref 850–3900)
MCH: 31.5 pg (ref 27.0–33.0)
MCHC: 34.2 g/dL (ref 32.0–36.0)
MCV: 92.3 fL (ref 80.0–100.0)
MPV: 9.2 fL (ref 7.5–12.5)
Monocytes Relative: 9.1 %
Neutro Abs: 3918 cells/uL (ref 1500–7800)
Neutrophils Relative %: 63.2 %
Platelets: 300 10*3/uL (ref 140–400)
RBC: 4.66 10*6/uL (ref 3.80–5.10)
RDW: 13.4 % (ref 11.0–15.0)
Total Lymphocyte: 23.3 %
WBC: 6.2 10*3/uL (ref 3.8–10.8)

## 2020-03-08 LAB — LIPID PANEL
Cholesterol: 232 mg/dL — ABNORMAL HIGH (ref <200)
HDL: 36 mg/dL — ABNORMAL LOW (ref >=50)
Non-HDL Cholesterol (Calc): 196 mg/dL (calc) — ABNORMAL HIGH (ref <130)
Total CHOL/HDL Ratio: 6.4 (calc) — ABNORMAL HIGH (ref <5.0)
Triglycerides: 646 mg/dL — ABNORMAL HIGH (ref <150)

## 2020-03-08 LAB — HEMOGLOBIN A1C
Hgb A1c MFr Bld: 8.3 % of total Hgb — ABNORMAL HIGH (ref <5.7)
Mean Plasma Glucose: 192 (calc)
eAG (mmol/L): 10.6 (calc)

## 2020-03-08 LAB — MICROALBUMIN / CREATININE URINE RATIO
Creatinine, Urine: 62 mg/dL (ref 20–275)
Microalb Creat Ratio: 1768 mcg/mg creat — ABNORMAL HIGH (ref <30)
Microalb, Ur: 109.6 mg/dL

## 2020-03-08 LAB — TSH: TSH: 1.96 mIU/L (ref 0.40–4.50)

## 2020-03-08 LAB — URIC ACID: Uric Acid, Serum: 5.7 mg/dL (ref 2.5–7.0)

## 2020-03-09 ENCOUNTER — Ambulatory Visit: Payer: Medicare PPO | Admitting: Internal Medicine

## 2020-03-09 ENCOUNTER — Other Ambulatory Visit: Payer: Self-pay

## 2020-03-09 ENCOUNTER — Encounter: Payer: Self-pay | Admitting: Internal Medicine

## 2020-03-09 ENCOUNTER — Telehealth: Payer: Self-pay | Admitting: Internal Medicine

## 2020-03-09 VITALS — BP 120/88 | HR 80 | Temp 98.2°F | Ht 64.0 in | Wt 214.0 lb

## 2020-03-09 DIAGNOSIS — E8881 Metabolic syndrome: Secondary | ICD-10-CM | POA: Diagnosis not present

## 2020-03-09 DIAGNOSIS — M810 Age-related osteoporosis without current pathological fracture: Secondary | ICD-10-CM | POA: Diagnosis not present

## 2020-03-09 DIAGNOSIS — E1169 Type 2 diabetes mellitus with other specified complication: Secondary | ICD-10-CM | POA: Diagnosis not present

## 2020-03-09 DIAGNOSIS — Z0000 Encounter for general adult medical examination without abnormal findings: Secondary | ICD-10-CM | POA: Diagnosis not present

## 2020-03-09 DIAGNOSIS — I10 Essential (primary) hypertension: Secondary | ICD-10-CM

## 2020-03-09 DIAGNOSIS — Z6836 Body mass index (BMI) 36.0-36.9, adult: Secondary | ICD-10-CM

## 2020-03-09 DIAGNOSIS — N184 Chronic kidney disease, stage 4 (severe): Secondary | ICD-10-CM

## 2020-03-09 DIAGNOSIS — Z8739 Personal history of other diseases of the musculoskeletal system and connective tissue: Secondary | ICD-10-CM | POA: Diagnosis not present

## 2020-03-09 DIAGNOSIS — Z8639 Personal history of other endocrine, nutritional and metabolic disease: Secondary | ICD-10-CM | POA: Diagnosis not present

## 2020-03-09 DIAGNOSIS — E785 Hyperlipidemia, unspecified: Secondary | ICD-10-CM

## 2020-03-09 LAB — POCT URINALYSIS DIPSTICK
Appearance: NEGATIVE
Bilirubin, UA: NEGATIVE
Blood, UA: NEGATIVE
Glucose, UA: NEGATIVE
Ketones, UA: NEGATIVE
Leukocytes, UA: NEGATIVE
Nitrite, UA: NEGATIVE
Odor: NEGATIVE
Protein, UA: NEGATIVE
Spec Grav, UA: 1.01 (ref 1.010–1.025)
Urobilinogen, UA: 0.2 E.U./dL
pH, UA: 6 (ref 5.0–8.0)

## 2020-03-09 MED ORDER — KETOCONAZOLE 2 % EX CREA: 1.0000 "application " | TOPICAL_CREAM | Freq: Every day | CUTANEOUS | 11 refills | Status: DC

## 2020-03-09 NOTE — Progress Notes (Signed)
Subjective:    Patient ID: [name], female    DOB: 1951/02/25, 69 y.o.   MRN: [id number]  HPI 69 year old Female seen for first annual Medicare Wellness, health maintenance exam and evaluation of medical issues.  History of melanoma removed from left trunk at [facility] in 04/05/2019.  Patient says it was a superficial lesion.  Dr. Michiel Sites is endocrinologist and manages hyperlipidemia and diabetes per patient.  Patient is seen at [facility] for chronic kidney disease.  For a while was on renal transplant list at [facility] but currently is on an active list as she has stabilized.  History of tubular adenoma April 04, 2018 on colonoscopy by Dr. Collene Mares  History of osteoporosis but has not wanted to be on therapy.  Dr. Lorrene Reid said she could take Prolia but patient has declined thus far.  Patient says Dr. Lorrene Reid does not want her to have live virus vaccines such as Shingrix.  Has annual diabetic eye exam.  She is allergic to sulfa.  Says Altase is caused cough in the past.  Patient says her dry weight is 212 pounds.  History of GE reflux, allergic rhinitis, dependent edema, osteopenia, carpal tunnel syndrome 2005-04-04, ganglion cyst removed from right wrist 1959, C-section 1992-04-04, D&C 1998.  In 2006/04/04 she had right-sided flank pain and was thought to have nephrolithiasis however she was found to have obstruction at UPJ junction.  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  Creatinine was mildly elevated at 1.5.  In January 2008 she was admitted with dehydration and renal insufficiency.  Creatinine initially was 1.6 but improved with hydration to 1.49.  She was on antibiotics for wound infection and had nausea and vomiting.  In July 2009 she was admitted with acute renal failure with creatinine up to 6.0.  She had a left ureteral stent placed.  Renogram showed poor function and poor clearance of the tracer from left kidney as well as increasing retention of the  right kidney.  Retrograde pyelogram showed no evidence of obstruction.  It was never clear exactly what caused the acute renal failure.  It was thought to perhaps be due to acute on chronic injury.  There was possible contribution from obstruction and Metformin.  Had hernia repair by Dr. Theda Sers at [facility] in 2009-04-04.  An incisional ventral hernia repair with mesh December 2011 by Dr. Dossie Der at [facility]  Baseline creatinine was 2.24 at that point.  Social history: She is married.  One adult daughter who is a Lawyer in [place].  She does not smoke.  Occasional wine consumption.  Formerly worked for [employer] but has retired.  Family history: Father with history of aortic valve replacement, hyperlipidemia, rheumatic heart disease.  Mother with history of hypertension, hypothyroidism, hyperlipidemia, COPD.  1 sister died in 04-Apr-2004 with coronary artery disease and hyperlipidemia.  1 sister living with hyperlipidemia.      Review of Systems no new complaints     Objective:   Physical Exam Blood pressure 120/88 pulse 80 temperature 98.2 degrees orally pulse oximetry 96% weight 214 pounds BMI 36.73  Skin warm and dry.  Nodes none.  TMs are clear.  Neck is supple without JVD thyromegaly or carotid bruits.  Chest clear to auscultation.  Cardiac exam regular rate and rhythm normal S1 and S2.  Breast without masses.  Abdomen soft nondistended without hepatosplenomegaly masses or tenderness.  No pitting edema of the lower extremities.  Pap was taken in 2019/04/05 and  will not be repeated.  Bimanual normal.  Rectovaginal confirms.  Neuro intact without focal deficits.         Assessment & Plan:  Chronic kidney disease stage IV followed by [facility] and stable.  Creatinine 1.99 and was 2.13 a year ago  Controlled type 2 diabetes mellitus followed by Dr. Chalmers Cater, endocrinologist-hemoglobin A1c increased from 7.7 in March 20 20 to  8.3%  Hypertriglyceridemia-intolerant of TriCor.  Total cholesterol has increased from 206 in March 19 2231.  Triglycerides are elevated at 646 and previously were 418 in March 2020.  Non-HDL cholesterol was 196.  Results faxed to endocrinologist  Essential hypertension-stable.  Blood pressure is 120/88 today  Elevated BMI -36.7 continue to work on diet and exercise.  Needs to lose weight.  Metabolic syndrome  History of gout  History of vitamin D deficiency  Plan: Return in 1 year or as needed.  Continue close follow-up with [place] and Endocrinologist.  Subjective:   Patient presents for Medicare Annual/Subsequent preventive examination.  Review Past Medical/Family/Social: see above   Risk Factors  Current exercise habits: walks some Dietary issues discussed: yes  Cardiac risk factors:Fmaily Hx CV disease as well as patient having hyperlipidemia and DM  Depression Screen  (Note: if answer to either of the following is "Yes", a more complete depression screening is indicated)   Over the past two weeks, have you felt down, depressed or hopeless? No  Over the past two weeks, have you felt little interest or pleasure in doing things? No Have you lost interest or pleasure in daily life? No Do you often feel hopeless? No Do you cry easily over simple problems? No   Activities of Daily Living  In your present state of health, do you have any difficulty performing the following activities?:   Driving? No  Managing money? No  Feeding yourself? No  Getting from bed to chair? No  Climbing a flight of stairs? No  Preparing food and eating?: No  Bathing or showering? No  Getting dressed: No  Getting to the toilet? No  Using the toilet:No  Moving around from place to place: No  In the past year have you fallen or had a near fall?:No  Are you sexually active? No  Do you have more than one partner? No   Hearing Difficulties: No  Do you often ask people to  speak up or repeat themselves? No  Do you experience ringing or noises in your ears? No  Do you have difficulty understanding soft or whispered voices? No  Do you feel that you have a problem with memory? No Do you often misplace items? No    Home Safety:  Do you have a smoke alarm at your residence? Yes Do you have grab bars in the bathroom? no Do you have throw rugs in your house? no   Cognitive Testing  Alert? Yes Normal Appearance?Yes  Oriented to person? Yes Place? Yes  Time? Yes  Recall of three objects? Yes  Can perform simple calculations? Yes  Displays appropriate judgment?Yes  Can read the correct time from a watch face?Yes   List the Names of Other Physician/Practitioners you currently use:  See referral list for the physicians patient is currently seeing.  Endocrinology Nephrology   Review of Systems: see above   Objective:     General appearance: Appears stated age and mildly obese  Head: Normocephalic, without obvious abnormality, atraumatic  Eyes: conj clear, EOMi PEERLA  Ears: normal TM's  and external ear canals both ears  Nose: Nares normal. Septum midline. Mucosa normal. No drainage or sinus tenderness.  Throat: lips, mucosa, and tongue normal; teeth and gums normal  Neck: no adenopathy, no carotid bruit, no JVD, supple, symmetrical, trachea midline and thyroid not enlarged, symmetric, no tenderness/mass/nodules  No CVA tenderness.  Lungs: clear to auscultation bilaterally  Breasts: normal appearance, no masses or tenderness Heart: regular rate and rhythm, S1, S2 normal, no murmur, click, rub or gallop  Abdomen: soft, non-tender; bowel sounds normal; no masses, no organomegaly  Musculoskeletal: ROM normal in all joints, no crepitus, no deformity, Normal muscle strengthen. Back  is symmetric, no curvature. Skin: Skin color, texture, turgor normal. No rashes or lesions  Lymph nodes: Cervical, supraclavicular, and axillary nodes normal.  Neurologic: CN  2 -12 Normal, Normal symmetric reflexes. Normal coordination and gait  Psych: Alert & Oriented x 3, Mood appear stable.    Assessment:    Annual wellness medicare exam   Plan:    During the course of the visit the patient was educated and counseled about appropriate screening and preventive services including:   Annual mammogram recommended  Has had 2 COVID-19 immunizations  Pneumococcal immunizations up-to-date and Tdap is up-to-date  Had colonoscopy in 2019     Patient Instructions (the written plan) was given to the patient.  Medicare Attestation  I have personally reviewed:  The patient's medical and social history  Their use of alcohol, tobacco or illicit drugs  Their current medications and supplements  The patient's functional ability including ADLs,fall risks, home safety risks, cognitive, and hearing and visual impairment  Diet and physical activities  Evidence for depression or mood disorders  The patient's weight, height, BMI, and visual acuity have been recorded in the chart. I have made referrals, counseling, and provided education to the patient based on review of the above and I have provided the patient with a written personalized care plan for preventive services.

## 2020-03-09 NOTE — Telephone Encounter (Signed)
Faxed a copy of lab results to Dr Chalmers Cater [phone], phone number [phone], [facility] [phone], phone [phone] plus Dr Leigh Aurora [phone], phone [phone]

## 2020-03-16 DIAGNOSIS — E063 Autoimmune thyroiditis: Secondary | ICD-10-CM | POA: Diagnosis not present

## 2020-03-16 DIAGNOSIS — E7800 Pure hypercholesterolemia, unspecified: Secondary | ICD-10-CM | POA: Diagnosis not present

## 2020-03-16 DIAGNOSIS — I10 Essential (primary) hypertension: Secondary | ICD-10-CM | POA: Diagnosis not present

## 2020-03-16 DIAGNOSIS — E1165 Type 2 diabetes mellitus with hyperglycemia: Secondary | ICD-10-CM | POA: Diagnosis not present

## 2020-03-16 DIAGNOSIS — N184 Chronic kidney disease, stage 4 (severe): Secondary | ICD-10-CM | POA: Diagnosis not present

## 2020-03-17 ENCOUNTER — Ambulatory Visit: Payer: Medicare PPO | Admitting: Podiatry

## 2020-03-17 ENCOUNTER — Encounter: Payer: Self-pay | Admitting: Podiatry

## 2020-03-17 VITALS — Temp 97.3°F

## 2020-03-17 DIAGNOSIS — M79674 Pain in right toe(s): Secondary | ICD-10-CM

## 2020-03-17 DIAGNOSIS — B351 Tinea unguium: Secondary | ICD-10-CM

## 2020-03-17 DIAGNOSIS — M79675 Pain in left toe(s): Secondary | ICD-10-CM

## 2020-03-17 DIAGNOSIS — E119 Type 2 diabetes mellitus without complications: Secondary | ICD-10-CM | POA: Diagnosis not present

## 2020-03-17 DIAGNOSIS — M2011 Hallux valgus (acquired), right foot: Secondary | ICD-10-CM

## 2020-03-17 DIAGNOSIS — N184 Chronic kidney disease, stage 4 (severe): Secondary | ICD-10-CM | POA: Diagnosis not present

## 2020-03-17 DIAGNOSIS — L84 Corns and callosities: Secondary | ICD-10-CM | POA: Diagnosis not present

## 2020-03-17 DIAGNOSIS — E1122 Type 2 diabetes mellitus with diabetic chronic kidney disease: Secondary | ICD-10-CM

## 2020-03-17 DIAGNOSIS — M2012 Hallux valgus (acquired), left foot: Secondary | ICD-10-CM

## 2020-03-17 DIAGNOSIS — Z794 Long term (current) use of insulin: Secondary | ICD-10-CM

## 2020-03-17 NOTE — Patient Instructions (Addendum)
Mrs. Gunkel: It was a pleasure meeting you and servicing your needs on today!  Schedule appointment for laser therapy for your toenails at your convenience.   Preventing Toenail Fungus from Recurring   Sanitize your shoes with Mycomist spray or a similar shoe sanitizer spray.  Follow the instructions on the bottle and dry them outside in the sun or with a hairdryer.  We also recommend repeating the sanitization once weekly in shoes you wear most often.   Throw away any shoes you have worn a significant amount without socks-fungus thrives in a warm moist environment and you want to avoid re-infection after your laser procedure   Bleach your socks with regular or color safe bleach   Change your socks regularly to keep your feet clean and dry (especially if you have sweaty feet)-if sweaty feet are a problem, let your doctor know-there is a great lotion that helps with this problem.   Clean your toenail clippers with alcohol before you use them if you do your own toenails and make sure to replace Emory boards and orange sticks regularly   If you get regular pedicures, bring your own instruments or go to a spa that sterilizes their instruments in an autoclave.  Diabetes Mellitus and Foot Care Foot care is an important part of your health, especially when you have diabetes. Diabetes may cause you to have problems because of poor blood flow (circulation) to your feet and legs, which can cause your skin to:  Become thinner and drier.  Break more easily.  Heal more slowly.  Peel and crack. You may also have nerve damage (neuropathy) in your legs and feet, causing decreased feeling in them. This means that you may not notice minor injuries to your feet that could lead to more serious problems. Noticing and addressing any potential problems early is the best way to prevent future foot problems. How to care for your feet Foot hygiene  Wash your feet daily with warm water and mild soap. Do  not use hot water. Then, pat your feet and the areas between your toes until they are completely dry. Do not soak your feet as this can dry your skin.  Trim your toenails straight across. Do not dig under them or around the cuticle. File the edges of your nails with an emery board or nail file.  Apply a moisturizing lotion or petroleum jelly to the skin on your feet and to dry, brittle toenails. Use lotion that does not contain alcohol and is unscented. Do not apply lotion between your toes. Shoes and socks  Wear clean socks or stockings every day. Make sure they are not too tight. Do not wear knee-high stockings since they may decrease blood flow to your legs.  Wear shoes that fit properly and have enough cushioning. Always look in your shoes before you put them on to be sure there are no objects inside.  To break in new shoes, wear them for just a few hours a day. This prevents injuries on your feet. Wounds, scrapes, corns, and calluses  Check your feet daily for blisters, cuts, bruises, sores, and redness. If you cannot see the bottom of your feet, use a mirror or ask someone for help.  Do not cut corns or calluses or try to remove them with medicine.  If you find a minor scrape, cut, or break in the skin on your feet, keep it and the skin around it clean and dry. You may clean these areas with mild  soap and water. Do not clean the area with peroxide, alcohol, or iodine.  If you have a wound, scrape, corn, or callus on your foot, look at it several times a day to make sure it is healing and not infected. Check for: ? Redness, swelling, or pain. ? Fluid or blood. ? Warmth. ? Pus or a bad smell. General instructions  Do not cross your legs. This may decrease blood flow to your feet.  Do not use heating pads or hot water bottles on your feet. They may burn your skin. If you have lost feeling in your feet or legs, you may not know this is happening until it is too late.  Protect your  feet from hot and cold by wearing shoes, such as at the beach or on hot pavement.  Schedule a complete foot exam at least once a year (annually) or more often if you have foot problems. If you have foot problems, report any cuts, sores, or bruises to your health care provider immediately. Contact a health care provider if:  You have a medical condition that increases your risk of infection and you have any cuts, sores, or bruises on your feet.  You have an injury that is not healing.  You have redness on your legs or feet.  You feel burning or tingling in your legs or feet.  You have pain or cramps in your legs and feet.  Your legs or feet are numb.  Your feet always feel cold.  You have pain around a toenail. Get help right away if:  You have a wound, scrape, corn, or callus on your foot and: ? You have pain, swelling, or redness that gets worse. ? You have fluid or blood coming from the wound, scrape, corn, or callus. ? Your wound, scrape, corn, or callus feels warm to the touch. ? You have pus or a bad smell coming from the wound, scrape, corn, or callus. ? You have a fever. ? You have a red line going up your leg. Summary  Check your feet every day for cuts, sores, red spots, swelling, and blisters.  Moisturize feet and legs daily.  Wear shoes that fit properly and have enough cushioning.  If you have foot problems, report any cuts, sores, or bruises to your health care provider immediately.  Schedule a complete foot exam at least once a year (annually) or more often if you have foot problems. This information is not intended to replace advice given to you by your health care provider. Make sure you discuss any questions you have with your health care provider. Document Revised: 09/08/2019 Document Reviewed: 01/17/2017 Elsevier Patient Education  [place]? An infection that lies within the keratin of your nail  plate that is caused by a fungus.  WHY ME? Fungal infections affect all ages, sexes, races, and creeds.  There may be many factors that predispose you to a fungal infection such as age, coexisting medical conditions such as diabetes, or an autoimmune disease; stress, medications, fatigue, genetics, etc.  Bottom line: fungus thrives in a warm, moist environment and your shoes offer such a location.  IS IT CONTAGIOUS? Theoretically, yes.  You do not want to share shoes, nail clippers or files with someone who has fungal toenails.  Walking around barefoot in the same room or sleeping in the same bed is unlikely to transfer the organism.  It is important to realize, however, that fungus can spread  easily from one nail to the next on the same foot.  HOW DO WE TREAT THIS?  There are several ways to treat this condition.  Treatment may depend on many factors such as age, medications, pregnancy, liver and kidney conditions, etc.  It is best to ask your doctor which options are available to you.  6. No treatment.   Unlike many other medical concerns, you can live with this condition.  However for many people this can be a painful condition and may lead to ingrown toenails or a bacterial infection.  It is recommended that you keep the nails cut short to help reduce the amount of fungal nail. 7. Topical treatment.  These range from herbal remedies to prescription strength nail lacquers.  About 40-50% effective, topicals require twice daily application for approximately 9 to 12 months or until an entirely new nail has grown out.  The most effective topicals are medical grade medications available through physicians offices. 8. Oral antifungal medications.  With an 80-90% cure rate, the most common oral medication requires 3 to 4 months of therapy and stays in your system for a year as the new nail grows out.  Oral antifungal medications do require blood work to make sure it is a safe drug for you.  A liver function  panel will be performed prior to starting the medication and after the first month of treatment.  It is important to have the blood work performed to avoid any harmful side effects.  In general, this medication safe but blood work is required. 9. Laser Therapy.  This treatment is performed by applying a specialized laser to the affected nail plate.  This therapy is noninvasive, fast, and non-painful.  It is not covered by insurance and is therefore, out of pocket.  The results have been very good with a 80-95% cure rate.  [facility] is the only practice in the area to offer this therapy. 10. Permanent Nail Avulsion.  Removing the entire nail so that a new nail will not grow back.

## 2020-03-23 NOTE — Progress Notes (Signed)
Subjective: [name] presents today referred by Elby Showers, MD for diabetic foot evaluation.  Patient relates 15 year history of diabetes.  Patient denies any history of foot wounds.  Patient relates occasional numbness, tingling, burning, pins/needles sensations in her feet.  Today, patient c/o of painful, discolored, thick toenails which interfere with daily activities.  Pain is aggravated when wearing enclosed shoe gear. Also states she has "yoga callus" on the bottom of her left foot.  Past Medical History:  Diagnosis Date  . Allergic rhinitis   . Carbuncle, trunk   . Carpal tunnel syndrome   . Chronic kidney disease   . Combined hyperlipidemia associated with type 2 diabetes mellitus ([place])   . Complication of anesthesia   . Cyclical vomiting   . Dependent edema   . Diabetes mellitus type II   . Diabetic autonomic neuropathy ([place])   . Diabetic peripheral neuropathy associated with type 2 diabetes mellitus ([place])   . Dyspepsia   . Essential hypertension, benign   . GERD (gastroesophageal reflux disease)   . Goiter   . Hypercholesteremia   . Menopause   . Obesity   . OSA (obstructive sleep apnea) 09/23/2016  . Osteopenia   . PONV (postoperative nausea and vomiting)   . Tachycardia   . Type II or unspecified type diabetes mellitus without mention of complication, not stated as uncontrolled     Patient Active Problem List   Diagnosis Date Noted  . Diabetes mellitus type 2, uncomplicated ([place]) 81/19/1478  . OSA (obstructive sleep apnea) 09/23/2016  . Pre-transplant evaluation for kidney transplant 03/15/2013  . Hypoglycemia associated with diabetes ([place]) 03/10/2013  . Gout 10/24/2012  . Goiter   . Obesity   . Diabetic peripheral neuropathy associated with type 2 diabetes mellitus ([place])   . Diabetic autonomic neuropathy ([place])   . Tachycardia   . Combined hyperlipidemia associated with type 2 diabetes mellitus ([place])   . Dyspepsia   . Hyperlipidemia  06/03/2011  . CKD (chronic kidney disease), stage IV ([place]) 06/03/2011  . DIABETES MELLITUS, TYPE II 03/22/2008  . HYPERTENSION 03/22/2008  . THYROIDITIS, HX OF 03/22/2008    Past Surgical History:  Procedure Laterality Date  . CESAREAN SECTION  1983  . COLONOSCOPY WITH PROPOFOL N/A 07/09/2018   Procedure: COLONOSCOPY WITH PROPOFOL;  Surgeon: Juanita Craver, MD;  Location: [facility];  Service: Endoscopy;  Laterality: N/A;  . DILATION AND CURETTAGE OF UTERUS  1988  . GANGLION CYST EXCISION  1959   rt wrist  . POLYPECTOMY  07/09/2018   Procedure: POLYPECTOMY;  Surgeon: Juanita Craver, MD;  Location: [facility];  Service: Endoscopy;;    Current Outpatient Medications on File Prior to Visit  Medication Sig Dispense Refill  . aspirin 81 MG chewable tablet Chew 81 mg by mouth daily.      Marland Kitchen atenolol (TENORMIN) 100 MG tablet Take 100 mg by mouth daily.      Marland Kitchen atorvastatin (LIPITOR) 40 MG tablet TAKE 1 TABLET BY MOUTH ONCE DAILY 30 tablet 11  . B-D UF III MINI PEN NEEDLES 31G X 5 MM MISC     . cholecalciferol (VITAMIN D) 1000 units tablet Take 1,000 Units by mouth 3 (three) times daily.     . febuxostat (ULORIC) 40 MG tablet Take 40 mg by mouth daily.     Marland Kitchen glucose blood (ONETOUCH VERIO) test strip Check blood sugar 3 x daily 100 each 6  . insulin lispro (HUMALOG) 100 UNIT/ML injection Inject into the skin 3 (  three) times daily before meals.    . Insulin NPH Human, Isophane, (NOVOLIN N [place]) Inject 18 Units into the skin daily.     Marland Kitchen ketoconazole (NIZORAL) 2 % cream Apply 1 application topically daily. 15 g 11  . Lancets (ONETOUCH ULTRASOFT) lancets Use as instructed 100 each 12  . loratadine (CLARITIN) 10 MG tablet Take 10 mg by mouth as needed.     . Multiple Vitamin (MULTIVITAMIN) capsule Take 1 capsule by mouth daily.    Marland Kitchen ofloxacin (OCUFLOX) 0.3 % ophthalmic solution Place 1 drop into both eyes 4 (four) times daily. 5 mL 0  . promethazine (PHENERGAN) 25 MG tablet Take 25 mg by mouth daily  as needed for nausea.    . sodium bicarbonate 650 MG tablet Take 650 mg by mouth 4 (four) times daily.     . TURMERIC PO Take 665 mg by mouth daily.      No current facility-administered medications on file prior to visit.     Allergies  Allergen Reactions  . Fish Oil Shortness Of Breath  . Metformin And Related Other (See Comments)    KIDNEY FAILURE?  Marland Kitchen Fenofibrate     fever  . Cephalexin Diarrhea and Nausea And Vomiting  . Ciprocin-Fluocin-Procin [Fluocinolone]     Acute kidney failure  . Ciprofloxacin Other (See Comments)    Kidney failure  . Insulins Other (See Comments)    Migraines, nausea  . Orange Fruit [Citrus]   . Ramipril Cough  . Sulfa Antibiotics Rash    Social History   Occupational History  . Occupation: retired  Tobacco Use  . Smoking status: Never Smoker  . Smokeless tobacco: Never Used  Substance and Sexual Activity  . Alcohol use: No    Alcohol/week: 0.0 - 1.0 standard drinks    Comment: rarely  . Drug use: No  . Sexual activity: Not on file    Family History  Problem Relation Age of Onset  . COPD Mother   . Hypertension Mother   . Thyroid disease Mother   . Heart disease Father   . Hyperlipidemia Father   . Diabetes Father   . Heart disease Sister   . Cancer Maternal Grandfather   . Diabetes Paternal Grandmother   . Breast cancer Neg Hx     Immunization History  Administered Date(s) Administered  . Influenza Split 10/29/2012  . Influenza,inj,Quad PF,6+ Mos 10/13/2013, 10/12/2014, 09/30/2015, 09/09/2016, 10/14/2017, 10/08/2018, 09/22/2019  . Influenza-Unspecified 10/08/2013  . PFIZER SARS-COV-2 Vaccination 01/20/2020, 02/10/2020  . Pneumococcal Conjugate-13 03/14/2015, 03/08/2019  . Pneumococcal Polysaccharide-23 04/29/2006  . Tdap 01/31/2004, 02/17/2014    Review of systems: Positive Findings in bold print.  Constitutional:  chills, fatigue, fever, sweats, weight change Communication: Optometrist, sign Ecologist, hand  writing, iPad/Android device Head: headaches, head injury Eyes: changes in vision, eye pain, glaucoma, cataracts, macular degeneration, diplopia, glare,  light sensitivity, eyeglasses or contacts, blindness Ears nose mouth throat: hearing impaired, hearing aids,  ringing in ears, deaf, sign language,  vertigo, nosebleeds,  rhinitis,  cold sores, snoring, swollen glands Cardiovascular: HTN, edema, arrhythmia, pacemaker in place, defibrillator in place, chest pain/tightness, chronic anticoagulation, blood clot, heart failure, MI Peripheral Vascular: leg cramps, varicose veins, blood clots, lymphedema, varicosities Respiratory:  difficulty breathing, denies congestion, SOB, wheezing, cough, emphysema Gastrointestinal: change in appetite or weight, abdominal pain, constipation, diarrhea, nausea, vomiting, vomiting blood, change in bowel habits, abdominal pain, jaundice, rectal bleeding, hemorrhoids, GERD Genitourinary:  nocturia,  pain on urination, polyuria,  blood in urine,  Foley catheter, urinary urgency, ESRD on hemodialysis Musculoskeletal: amputation, cramping, stiff joints, painful joints, decreased joint motion, fractures, OA, gout, hemiplegia, paraplegia, uses cane, wheelchair bound, uses walker, uses rollator Skin: +changes in toenails, color change, dryness, itching, mole changes,  rash, wound(s) Neurological: headaches, numbness in feet, paresthesias in feet, burning in feet, fainting,  seizures, change in speech,  headaches, memory problems/poor historian, cerebral palsy, weakness, paralysis, CVA, TIA Endocrine: diabetes, hypothyroidism, hyperthyroidism,  goiter, dry mouth, flushing, heat intolerance,  cold intolerance,  excessive thirst, denies polyuria,  nocturia Hematological:  easy bleeding, excessive bleeding, easy bruising, enlarged lymph nodes, on long term blood thinner, history of past transusions Allergy/immunological:  hives, eczema, frequent infections, multiple drug allergies,  seasonal allergies, transplant recipient, multiple food allergies Psychiatric:  anxiety, depression, mood disorder, suicidal ideations, hallucinations, insomnia  Objective: Vitals:   03/17/20 0924  Temp: (!) 97.3 F (36.3 C)    69 y.o. Caucasian female oebese in NAD. AAO X 3.  Capillary refill time to digits immediate b/l. Palpable DP pulses b/l. Palpable PT pulses b/l. Pedal hair sparse b/l. Skin temperature gradient within normal limits b/l.  Pedal skin with normal turgor, texture and tone bilaterally. No open wounds bilaterally. No interdigital macerations bilaterally. Toenails 1-5 b/l elongated, dystrophic, thickened, crumbly with subungual debris and tenderness to dorsal palpation. Hyperkeratotic lesion(s) submet head 1 left foot.  No erythema, no edema, no drainage, no flocculence.  Normal muscle strength 5/5 to all lower extremity muscle groups bilaterally, no pain crepitus or joint limitation noted with ROM b/l and bunion deformity noted b/l  Protective sensation intact 5/5 intact bilaterally with 10g monofilament b/l Vibratory sensation intact b/l Proprioception intact bilaterally.  1. Pain due to onychomycosis of toenails of both feet   2. Callus   3. Hallux valgus, acquired, bilateral   4. Type 2 diabetes mellitus with stage 4 chronic kidney disease, with long-term current use of insulin ([place])   5. Encounter for diabetic foot exam ([place])     -Diabetic foot examination performed on today's visit. -Discussed diabetic foot care principles. Literature dispensed on today. -Discussed topical, laser and oral medication. Patient opted for laser treatment of mycotic toenails. Discussed therapy course and cost involved. Patient understands this is a 68-month treatment course broken down into one visit per month for five consecutive months. Patient would like to proceed and will schedule first session at his/her convenience. -Toenails 1-5 b/l were debrided in length and girth with sterile  nail nippers and dremel without iatrogenic bleeding.  -Callus(es) submet head 1 left foot were debrided without complication or incident. Total number debrided =1. -Patient to continue soft, supportive shoe gear daily. -Patient to report any pedal injuries to medical professional immediately. -Patient/POA to call should there be question/concern in the interim.  Return in about 3 months (around 06/17/2020).

## 2020-03-30 ENCOUNTER — Other Ambulatory Visit: Payer: Self-pay

## 2020-03-30 ENCOUNTER — Encounter: Payer: Self-pay | Admitting: Internal Medicine

## 2020-03-30 ENCOUNTER — Ambulatory Visit (INDEPENDENT_AMBULATORY_CARE_PROVIDER_SITE_OTHER): Payer: Medicare PPO | Admitting: *Deleted

## 2020-03-30 ENCOUNTER — Ambulatory Visit (INDEPENDENT_AMBULATORY_CARE_PROVIDER_SITE_OTHER): Payer: Medicare PPO | Admitting: Internal Medicine

## 2020-03-30 VITALS — BP 110/70 | HR 68 | Temp 98.0°F | Ht 64.0 in | Wt 214.0 lb

## 2020-03-30 DIAGNOSIS — M79675 Pain in left toe(s): Secondary | ICD-10-CM

## 2020-03-30 DIAGNOSIS — B351 Tinea unguium: Secondary | ICD-10-CM

## 2020-03-30 DIAGNOSIS — Z23 Encounter for immunization: Secondary | ICD-10-CM

## 2020-03-30 DIAGNOSIS — M79674 Pain in right toe(s): Secondary | ICD-10-CM

## 2020-03-30 NOTE — Progress Notes (Signed)
Patient presents today for the 1st laser treatment. Diagnosed with mycotic nail infection by Dr. Elisha Ponder. There is slight areas of fungus on each nail, but the main discoloration seems to be on the hallux nails bilateral.  All other systems are negative.  Nails were filed thin. Laser therapy was administered to 1-5 toenails bilateral and patient tolerated the treatment well. All safety precautions were in place.    Follow up in 4 weeks for laser # 2.  Her fungus appears very superficial and may not need but 2-3 treatments total.  Picture of nails taken today to document visual progress

## 2020-03-30 NOTE — Patient Instructions (Signed)

## 2020-04-15 ENCOUNTER — Encounter: Payer: Self-pay | Admitting: Internal Medicine

## 2020-04-15 NOTE — Progress Notes (Signed)
Pnemovax 23 vaccine given by CMA and tolerated well.

## 2020-04-15 NOTE — Patient Instructions (Signed)
Pneumovax 23 injection given today

## 2020-04-22 NOTE — Patient Instructions (Signed)
It was a pleasure to see you today.  Please work on diet exercise and weight loss.  Otherwise continue current medications and follow-up in 1 year or as needed.  Continue close follow-up with nephrology and endocrinology.

## 2020-04-25 DIAGNOSIS — E1165 Type 2 diabetes mellitus with hyperglycemia: Secondary | ICD-10-CM | POA: Diagnosis not present

## 2020-04-25 DIAGNOSIS — E7800 Pure hypercholesterolemia, unspecified: Secondary | ICD-10-CM | POA: Diagnosis not present

## 2020-04-25 DIAGNOSIS — E063 Autoimmune thyroiditis: Secondary | ICD-10-CM | POA: Diagnosis not present

## 2020-04-25 DIAGNOSIS — N184 Chronic kidney disease, stage 4 (severe): Secondary | ICD-10-CM | POA: Diagnosis not present

## 2020-04-25 DIAGNOSIS — I10 Essential (primary) hypertension: Secondary | ICD-10-CM | POA: Diagnosis not present

## 2020-05-01 ENCOUNTER — Other Ambulatory Visit: Payer: Medicare PPO

## 2020-05-12 ENCOUNTER — Ambulatory Visit (INDEPENDENT_AMBULATORY_CARE_PROVIDER_SITE_OTHER): Payer: Medicare PPO | Admitting: *Deleted

## 2020-05-12 ENCOUNTER — Other Ambulatory Visit: Payer: Self-pay

## 2020-05-12 DIAGNOSIS — M79674 Pain in right toe(s): Secondary | ICD-10-CM

## 2020-05-12 DIAGNOSIS — M79675 Pain in left toe(s): Secondary | ICD-10-CM

## 2020-05-12 DIAGNOSIS — B351 Tinea unguium: Secondary | ICD-10-CM

## 2020-05-12 NOTE — Progress Notes (Signed)
Patient presents today for the 2nd laser treatment. Diagnosed with mycotic nail infection by Dr. Elisha Ponder.   There is slight areas of fungus on each nail, but the main discoloration seems to be on the hallux nails bilateral. They do look much better. The patient says she was cutting her 4th toenail due to a hangnail and cut some of the skin. The area is slightly red along the medial border.  All other systems are negative.  Nails were filed thin. Laser therapy was administered to 1-5 toenails bilateral and patient tolerated the treatment well. All safety precautions were in place.   Advised her to soak and use neosporin and a bandaid to cover the toe and if it gets worse, then she needs to get in here to see Dr. Elisha Ponder.   We will wait to schedule anymore lasers for now. She has an appointment with Dr. Elisha Ponder mid June for possible ingrown toenail procedures.   Picture of nails taken today to document visual progress

## 2020-06-06 ENCOUNTER — Encounter: Payer: Self-pay | Admitting: Podiatry

## 2020-06-06 ENCOUNTER — Other Ambulatory Visit: Payer: Self-pay

## 2020-06-06 ENCOUNTER — Ambulatory Visit: Payer: Medicare PPO | Admitting: Podiatry

## 2020-06-06 DIAGNOSIS — Z794 Long term (current) use of insulin: Secondary | ICD-10-CM | POA: Diagnosis not present

## 2020-06-06 DIAGNOSIS — L03032 Cellulitis of left toe: Secondary | ICD-10-CM

## 2020-06-06 DIAGNOSIS — M79675 Pain in left toe(s): Secondary | ICD-10-CM | POA: Diagnosis not present

## 2020-06-06 DIAGNOSIS — B351 Tinea unguium: Secondary | ICD-10-CM

## 2020-06-06 DIAGNOSIS — L84 Corns and callosities: Secondary | ICD-10-CM | POA: Diagnosis not present

## 2020-06-06 DIAGNOSIS — M79674 Pain in right toe(s): Secondary | ICD-10-CM

## 2020-06-06 DIAGNOSIS — E1122 Type 2 diabetes mellitus with diabetic chronic kidney disease: Secondary | ICD-10-CM

## 2020-06-06 DIAGNOSIS — N184 Chronic kidney disease, stage 4 (severe): Secondary | ICD-10-CM

## 2020-06-06 MED ORDER — DOXYCYCLINE HYCLATE 100 MG PO CAPS: 100.0000 mg | ORAL_CAPSULE | Freq: Two times a day (BID) | ORAL | 0 refills | Status: AC

## 2020-06-06 NOTE — Patient Instructions (Addendum)
EPSOM SALT FOOT SOAK INSTRUCTIONS  Shopping List:  A. Plain epsom salt (not scented) B. Neosporin Cream/Ointment or Bacitracin Cream/Ointment C. 1-inch fabric band-aids   1.  Place 1/4 cup of epsom salts in 2 quarts of warm tap water. IF YOU ARE DIABETIC, OR HAVE NEUROPATHY, CHECK THE TEMPERATURE OF THE WATER WITH YOUR ELBOW.  2.  Submerge your foot/feet in the solution and soak for 10-15 minutes.      3.  Next, remove your foot or feet from solution, blot dry the affected area.    4.  Apply antibiotic ointment and cover with fabric band-aid .  5.  This soak should be done once a day for 7 days.   6.  Monitor for any signs/symptoms of infection such as redness, swelling, odor, drainage, increased pain, or non-healing of digit.   7.  Please do not hesitate to call the office and speak to a Nurse or Doctor if you have questions.   8.  If you experience fever, chills, nightsweats, nausea or vomiting with worsening of digit, please go to the emergency room.    Diabetes Mellitus and Foot Care Foot care is an important part of your health, especially when you have diabetes. Diabetes may cause you to have problems because of poor blood flow (circulation) to your feet and legs, which can cause your skin to:  Become thinner and drier.  Break more easily.  Heal more slowly.  Peel and crack. You may also have nerve damage (neuropathy) in your legs and feet, causing decreased feeling in them. This means that you may not notice minor injuries to your feet that could lead to more serious problems. Noticing and addressing any potential problems early is the best way to prevent future foot problems. How to care for your feet Foot hygiene  Wash your feet daily with warm water and mild soap. Do not use hot water. Then, pat your feet and the areas between your toes until they are completely dry. Do not soak your feet as this can dry your skin.  Trim your toenails straight across. Do not dig  under them or around the cuticle. File the edges of your nails with an emery board or nail file.  Apply a moisturizing lotion or petroleum jelly to the skin on your feet and to dry, brittle toenails. Use lotion that does not contain alcohol and is unscented. Do not apply lotion between your toes. Shoes and socks  Wear clean socks or stockings every day. Make sure they are not too tight. Do not wear knee-high stockings since they may decrease blood flow to your legs.  Wear shoes that fit properly and have enough cushioning. Always look in your shoes before you put them on to be sure there are no objects inside.  To break in new shoes, wear them for just a few hours a day. This prevents injuries on your feet. Wounds, scrapes, corns, and calluses  Check your feet daily for blisters, cuts, bruises, sores, and redness. If you cannot see the bottom of your feet, use a mirror or ask someone for help.  Do not cut corns or calluses or try to remove them with medicine.  If you find a minor scrape, cut, or break in the skin on your feet, keep it and the skin around it clean and dry. You may clean these areas with mild soap and water. Do not clean the area with peroxide, alcohol, or iodine.  If you have a wound, scrape,  corn, or callus on your foot, look at it several times a day to make sure it is healing and not infected. Check for: ? Redness, swelling, or pain. ? Fluid or blood. ? Warmth. ? Pus or a bad smell. General instructions  Do not cross your legs. This may decrease blood flow to your feet.  Do not use heating pads or hot water bottles on your feet. They may burn your skin. If you have lost feeling in your feet or legs, you may not know this is happening until it is too late.  Protect your feet from hot and cold by wearing shoes, such as at the beach or on hot pavement.  Schedule a complete foot exam at least once a year (annually) or more often if you have foot problems. If you have  foot problems, report any cuts, sores, or bruises to your health care provider immediately. Contact a health care provider if:  You have a medical condition that increases your risk of infection and you have any cuts, sores, or bruises on your feet.  You have an injury that is not healing.  You have redness on your legs or feet.  You feel burning or tingling in your legs or feet.  You have pain or cramps in your legs and feet.  Your legs or feet are numb.  Your feet always feel cold.  You have pain around a toenail. Get help right away if:  You have a wound, scrape, corn, or callus on your foot and: ? You have pain, swelling, or redness that gets worse. ? You have fluid or blood coming from the wound, scrape, corn, or callus. ? Your wound, scrape, corn, or callus feels warm to the touch. ? You have pus or a bad smell coming from the wound, scrape, corn, or callus. ? You have a fever. ? You have a red line going up your leg. Summary  Check your feet every day for cuts, sores, red spots, swelling, and blisters.  Moisturize feet and legs daily.  Wear shoes that fit properly and have enough cushioning.  If you have foot problems, report any cuts, sores, or bruises to your health care provider immediately.  Schedule a complete foot exam at least once a year (annually) or more often if you have foot problems. This information is not intended to replace advice given to you by your health care provider. Make sure you discuss any questions you have with your health care provider. Document Revised: 09/08/2019 Document Reviewed: 01/17/2017 Elsevier Patient Education  [place].

## 2020-06-09 NOTE — Progress Notes (Signed)
Subjective: Jed Limerick presents today preventative diabetic foot care and painful mycotic nails b/l that are difficult to trim. Pain interferes with ambulation. Aggravating factors include wearing enclosed shoe gear. Pain is relieved with periodic professional debridement.   She states she has had 2 laser treatments. Also relates she peeled a small piece of skin off of her right 3rd toe. Toe is somewhat red. Denies any drainage. She did see Caryl Pina for laser therapy and was instructed to soak and use Neosporin and and a band-aid.  Elby Showers, MD is patient's PCP. Last visit was: 03/09/2020.  Past Medical History:  Diagnosis Date  . Allergic rhinitis   . Carbuncle, trunk   . Carpal tunnel syndrome   . Chronic kidney disease   . Combined hyperlipidemia associated with type 2 diabetes mellitus ([place])   . Complication of anesthesia   . Cyclical vomiting   . Dependent edema   . Diabetes mellitus type II   . Diabetic autonomic neuropathy ([place])   . Diabetic peripheral neuropathy associated with type 2 diabetes mellitus ([place])   . Dyspepsia   . Essential hypertension, benign   . GERD (gastroesophageal reflux disease)   . Goiter   . Hypercholesteremia   . Menopause   . Obesity   . OSA (obstructive sleep apnea) 09/23/2016  . Osteopenia   . PONV (postoperative nausea and vomiting)   . Tachycardia   . Type II or unspecified type diabetes mellitus without mention of complication, not stated as uncontrolled      Current Outpatient Medications on File Prior to Visit  Medication Sig Dispense Refill  . aspirin 81 MG chewable tablet Chew 81 mg by mouth daily.      Marland Kitchen atenolol (TENORMIN) 100 MG tablet Take 100 mg by mouth daily.      Marland Kitchen atorvastatin (LIPITOR) 40 MG tablet TAKE 1 TABLET BY MOUTH ONCE DAILY 30 tablet 11  . B-D UF III MINI PEN NEEDLES 31G X 5 MM MISC     . cholecalciferol (VITAMIN D) 1000 units tablet Take 1,000 Units by mouth 3 (three) times daily.     . febuxostat  (ULORIC) 40 MG tablet Take 40 mg by mouth daily.     Marland Kitchen glucose blood (ONETOUCH VERIO) test strip Check blood sugar 3 x daily 100 each 6  . insulin lispro (HUMALOG) 100 UNIT/ML injection Inject into the skin 3 (three) times daily before meals.    . Insulin NPH Human, Isophane, (NOVOLIN N [place]) Inject 18 Units into the skin daily.     Marland Kitchen ketoconazole (NIZORAL) 2 % cream Apply 1 application topically daily. 15 g 11  . Lancets (ONETOUCH ULTRASOFT) lancets Use as instructed 100 each 12  . loratadine (CLARITIN) 10 MG tablet Take 10 mg by mouth as needed.     . Multiple Vitamin (MULTIVITAMIN) capsule Take 1 capsule by mouth daily.    Marland Kitchen ofloxacin (OCUFLOX) 0.3 % ophthalmic solution Place 1 drop into both eyes 4 (four) times daily. 5 mL 0  . promethazine (PHENERGAN) 25 MG tablet Take 25 mg by mouth daily as needed for nausea.    . sodium bicarbonate 650 MG tablet Take 650 mg by mouth 4 (four) times daily.     . TURMERIC PO Take 665 mg by mouth daily.      No current facility-administered medications on file prior to visit.     Allergies  Allergen Reactions  . Fish Oil Shortness Of Breath  . Metformin And Related Other (See Comments)  KIDNEY FAILURE?  Marland Kitchen Fenofibrate     fever  . Cephalexin Diarrhea and Nausea And Vomiting  . Ciprocin-Fluocin-Procin [Fluocinolone]     Acute kidney failure  . Ciprofloxacin Other (See Comments)    Kidney failure  . Insulins Other (See Comments)    Migraines, nausea  . Orange Fruit [Citrus]   . Ramipril Cough  . Sulfa Antibiotics Rash    Objective: Henri Guedes is a pleasant 69 y.o. y.o. Patient Race: White or Caucasian [1]  female in NAD. AAO x 3.  There were no vitals filed for this visit.  Vascular Examination: Neurovascular status unchanged b/l lower extremities. Capillary refill time to digits immediate b/l. Palpable DP pulses b/l. Palpable PT pulses b/l. Pedal hair sparse b/l. Skin temperature gradient within normal limits  b/l.  Dermatological Examination: Pedal skin with normal turgor, texture and tone bilaterally. No open wounds bilaterally. No interdigital macerations bilaterally. Toenails 1-5 b/l elongated, discolored, dystrophic, thickened, crumbly with subungual debris and tenderness to dorsal palpation. Hyperkeratotic lesion(s) submet head 1 left foot.  No erythema, no edema, no drainage, no flocculence.   Incurvated nailplate b/l great toes with tenderness to palpation. No erythema, no edema, no drainage noted.  Musculoskeletal: Normal muscle strength 5/5 to all lower extremity muscle groups bilaterally. No pain crepitus or joint limitation noted with ROM b/l. Hallux valgus with bunion deformity noted b/l lower extremities.  Neurological Examination: Protective sensation intact 5/5 intact bilaterally with 10g monofilament b/l. Vibratory sensation intact b/l. Proprioception intact bilaterally.  Assessment: 1. Pain due to onychomycosis of toenails of both feet   2. Callus   3. Cellulitis of third toe, left   4. Hallux valgus, acquired, bilateral   5. Type 2 diabetes mellitus with stage 4 chronic kidney disease, with long-term current use of insulin ([place])   Plan:  -Examined patient. We will discontinue laser therapy for now until her cellulitis of the right 3rd digit resolves. -Continue diabetic foot care principles. -Toenails 1-5 b/l were debrided in length and girth with sterile nail nippers and dremel without iatrogenic bleeding.  -Offending nail border debrided and curretaged L hallux and R hallux utilizing sterile nail nipper and currette. Border(s) cleansed with alcohol and triple antibiotic ointment applied applied. Dispensed written instructions for once daily epsom salt soaks for 7 days. -Callus(es) submet head 1 left foot pared utilizing sterile scalpel blade without complication or incident. Total number debrided =1. -Patient to report any pedal injuries to medical professional  immediately. -Rx for Doxycyline 100 mg, #14, to be taken twice daily for 7 days. -Patient to continue soft, supportive shoe gear daily. -Patient/POA to call should there be question/concern in the interim.  Return in about 9 weeks (around 08/08/2020) for diabetic nail and callus trim.  Marzetta Board, DPM

## 2020-06-13 ENCOUNTER — Ambulatory Visit: Payer: Medicare PPO | Admitting: Podiatry

## 2020-06-21 ENCOUNTER — Other Ambulatory Visit: Payer: Self-pay

## 2020-06-21 ENCOUNTER — Encounter: Payer: Self-pay | Admitting: Podiatry

## 2020-06-21 ENCOUNTER — Ambulatory Visit: Payer: Medicare PPO | Admitting: Podiatry

## 2020-06-21 DIAGNOSIS — L03031 Cellulitis of right toe: Secondary | ICD-10-CM | POA: Diagnosis not present

## 2020-06-21 DIAGNOSIS — Z8619 Personal history of other infectious and parasitic diseases: Secondary | ICD-10-CM

## 2020-06-21 NOTE — Progress Notes (Signed)
Subjective:  [place] Bryngelson presents today for check up on right 3rd digit cellulitis.  Patient states she completed antibiotics with no problem.   She is currently taking care of her daughter who is recovering from back surgery.  Objective: There were no vitals filed for this visit.  69 y.o. female WD, WN IN NAD. AAO X 3.  Neurovascular status unchanged b/l lower extremities. Capillary refill time to digits immediate b/l. Palpable pedal pulses b/l LE. Pedal hair sparse. Lower extremity skin temperature gradient within normal limits.  Pedal skin with normal turgor, texture and tone bilaterally. No open wounds bilaterally. No interdigital macerations bilaterally. Right 3rd toe cellulitis resolved..  Normal muscle strength 5/5 to all lower extremity muscle groups bilaterally. No pain crepitus or joint limitation noted with ROM b/l. Hallux valgus with bunion deformity noted b/l lower extremities.  Protective sensation intact 5/5 intact bilaterally with 10g monofilament b/l. Vibratory sensation intact b/l. Proprioception intact bilaterally.  Assessment: Cellulitis right 3rd digit, resolved  NIDDM with stage 4 CKD  Plan: -Examined patient. -Continue diabetic foot care principles. -Patient to report any pedal injuries to medical professional immediately. -Patient to continue soft, supportive shoe gear daily. -Patient/POA to call should there be question/concern in the interim.  Return in about 9 weeks (around 08/23/2020) for diabetic callus trim.

## 2020-06-23 ENCOUNTER — Ambulatory Visit: Payer: Medicare PPO | Admitting: Podiatry

## 2020-07-10 DIAGNOSIS — N2581 Secondary hyperparathyroidism of renal origin: Secondary | ICD-10-CM | POA: Diagnosis not present

## 2020-07-10 DIAGNOSIS — E785 Hyperlipidemia, unspecified: Secondary | ICD-10-CM | POA: Diagnosis not present

## 2020-07-10 DIAGNOSIS — N2589 Other disorders resulting from impaired renal tubular function: Secondary | ICD-10-CM | POA: Diagnosis not present

## 2020-07-10 DIAGNOSIS — E875 Hyperkalemia: Secondary | ICD-10-CM | POA: Diagnosis not present

## 2020-07-10 DIAGNOSIS — I129 Hypertensive chronic kidney disease with stage 1 through stage 4 chronic kidney disease, or unspecified chronic kidney disease: Secondary | ICD-10-CM | POA: Diagnosis not present

## 2020-07-10 DIAGNOSIS — N184 Chronic kidney disease, stage 4 (severe): Secondary | ICD-10-CM | POA: Diagnosis not present

## 2020-07-10 DIAGNOSIS — E1122 Type 2 diabetes mellitus with diabetic chronic kidney disease: Secondary | ICD-10-CM | POA: Diagnosis not present

## 2020-07-10 DIAGNOSIS — E119 Type 2 diabetes mellitus without complications: Secondary | ICD-10-CM | POA: Diagnosis not present

## 2020-07-10 DIAGNOSIS — Z794 Long term (current) use of insulin: Secondary | ICD-10-CM | POA: Diagnosis not present

## 2020-07-11 ENCOUNTER — Telehealth: Payer: Self-pay | Admitting: Podiatry

## 2020-07-11 NOTE — Telephone Encounter (Signed)
My guarantor number is [id number]. I'm calling about a bill I got for $40 which is my copay that's already been paid. You can call me on my home or mobile number. Thank you. Have a good day.

## 2020-07-12 ENCOUNTER — Telehealth: Payer: Self-pay | Admitting: Podiatry

## 2020-07-12 NOTE — Telephone Encounter (Signed)
My account number is [id number] and its showing that I owe a $40 payment and I think that's incorrect. If you would give me a call back at [phone]. Thank you.

## 2020-07-29 DIAGNOSIS — G4733 Obstructive sleep apnea (adult) (pediatric): Secondary | ICD-10-CM | POA: Diagnosis not present

## 2020-08-07 ENCOUNTER — Ambulatory Visit: Payer: Medicare PPO | Admitting: Podiatry

## 2020-08-14 DIAGNOSIS — R1031 Right lower quadrant pain: Secondary | ICD-10-CM | POA: Diagnosis not present

## 2020-08-14 DIAGNOSIS — R1032 Left lower quadrant pain: Secondary | ICD-10-CM | POA: Diagnosis not present

## 2020-08-14 DIAGNOSIS — Z9889 Other specified postprocedural states: Secondary | ICD-10-CM | POA: Diagnosis not present

## 2020-08-14 DIAGNOSIS — Z8719 Personal history of other diseases of the digestive system: Secondary | ICD-10-CM | POA: Diagnosis not present

## 2020-08-17 DIAGNOSIS — Z01810 Encounter for preprocedural cardiovascular examination: Secondary | ICD-10-CM | POA: Diagnosis not present

## 2020-08-17 DIAGNOSIS — I998 Other disorder of circulatory system: Secondary | ICD-10-CM | POA: Diagnosis not present

## 2020-08-17 DIAGNOSIS — K760 Fatty (change of) liver, not elsewhere classified: Secondary | ICD-10-CM | POA: Diagnosis not present

## 2020-08-17 DIAGNOSIS — N049 Nephrotic syndrome with unspecified morphologic changes: Secondary | ICD-10-CM | POA: Diagnosis not present

## 2020-08-17 DIAGNOSIS — N184 Chronic kidney disease, stage 4 (severe): Secondary | ICD-10-CM | POA: Diagnosis not present

## 2020-08-17 DIAGNOSIS — K5710 Diverticulosis of small intestine without perforation or abscess without bleeding: Secondary | ICD-10-CM | POA: Diagnosis not present

## 2020-08-17 DIAGNOSIS — I708 Atherosclerosis of other arteries: Secondary | ICD-10-CM | POA: Diagnosis not present

## 2020-08-17 DIAGNOSIS — R9439 Abnormal result of other cardiovascular function study: Secondary | ICD-10-CM | POA: Diagnosis not present

## 2020-08-17 DIAGNOSIS — Z7682 Awaiting organ transplant status: Secondary | ICD-10-CM | POA: Diagnosis not present

## 2020-08-17 DIAGNOSIS — Z1159 Encounter for screening for other viral diseases: Secondary | ICD-10-CM | POA: Diagnosis not present

## 2020-08-17 DIAGNOSIS — N261 Atrophy of kidney (terminal): Secondary | ICD-10-CM | POA: Diagnosis not present

## 2020-08-17 DIAGNOSIS — I517 Cardiomegaly: Secondary | ICD-10-CM | POA: Diagnosis not present

## 2020-08-18 ENCOUNTER — Ambulatory Visit: Payer: Medicare PPO | Admitting: Podiatry

## 2020-09-11 DIAGNOSIS — H25043 Posterior subcapsular polar age-related cataract, bilateral: Secondary | ICD-10-CM | POA: Diagnosis not present

## 2020-09-11 DIAGNOSIS — H2513 Age-related nuclear cataract, bilateral: Secondary | ICD-10-CM | POA: Diagnosis not present

## 2020-09-11 DIAGNOSIS — H5213 Myopia, bilateral: Secondary | ICD-10-CM | POA: Diagnosis not present

## 2020-09-13 ENCOUNTER — Ambulatory Visit (INDEPENDENT_AMBULATORY_CARE_PROVIDER_SITE_OTHER): Payer: Medicare PPO | Admitting: Internal Medicine

## 2020-09-13 ENCOUNTER — Encounter: Payer: Self-pay | Admitting: Internal Medicine

## 2020-09-13 ENCOUNTER — Other Ambulatory Visit: Payer: Self-pay

## 2020-09-13 VITALS — BP 120/80 | HR 78 | Temp 98.3°F | Ht 64.0 in | Wt 214.0 lb

## 2020-09-13 DIAGNOSIS — Z23 Encounter for immunization: Secondary | ICD-10-CM | POA: Diagnosis not present

## 2020-09-13 NOTE — Progress Notes (Signed)
Flu vaccine given by CMA 

## 2020-09-13 NOTE — Patient Instructions (Signed)
Flu vaccine given.

## 2020-09-21 DIAGNOSIS — L578 Other skin changes due to chronic exposure to nonionizing radiation: Secondary | ICD-10-CM | POA: Diagnosis not present

## 2020-09-21 DIAGNOSIS — Z85820 Personal history of malignant melanoma of skin: Secondary | ICD-10-CM | POA: Diagnosis not present

## 2020-09-21 DIAGNOSIS — Z808 Family history of malignant neoplasm of other organs or systems: Secondary | ICD-10-CM | POA: Diagnosis not present

## 2020-09-21 DIAGNOSIS — L821 Other seborrheic keratosis: Secondary | ICD-10-CM | POA: Diagnosis not present

## 2020-09-21 DIAGNOSIS — D225 Melanocytic nevi of trunk: Secondary | ICD-10-CM | POA: Diagnosis not present

## 2020-09-21 DIAGNOSIS — L814 Other melanin hyperpigmentation: Secondary | ICD-10-CM | POA: Diagnosis not present

## 2020-09-21 DIAGNOSIS — L719 Rosacea, unspecified: Secondary | ICD-10-CM | POA: Diagnosis not present

## 2020-09-22 ENCOUNTER — Ambulatory Visit: Payer: Medicare PPO | Admitting: Podiatry

## 2020-09-25 DIAGNOSIS — M2550 Pain in unspecified joint: Secondary | ICD-10-CM | POA: Diagnosis not present

## 2020-09-25 DIAGNOSIS — E7800 Pure hypercholesterolemia, unspecified: Secondary | ICD-10-CM | POA: Diagnosis not present

## 2020-09-25 DIAGNOSIS — E790 Hyperuricemia without signs of inflammatory arthritis and tophaceous disease: Secondary | ICD-10-CM | POA: Diagnosis not present

## 2020-09-25 DIAGNOSIS — I10 Essential (primary) hypertension: Secondary | ICD-10-CM | POA: Diagnosis not present

## 2020-09-25 DIAGNOSIS — N184 Chronic kidney disease, stage 4 (severe): Secondary | ICD-10-CM | POA: Diagnosis not present

## 2020-09-25 DIAGNOSIS — E1165 Type 2 diabetes mellitus with hyperglycemia: Secondary | ICD-10-CM | POA: Diagnosis not present

## 2020-09-25 DIAGNOSIS — E063 Autoimmune thyroiditis: Secondary | ICD-10-CM | POA: Diagnosis not present

## 2020-09-26 DIAGNOSIS — H25042 Posterior subcapsular polar age-related cataract, left eye: Secondary | ICD-10-CM | POA: Diagnosis not present

## 2020-09-26 DIAGNOSIS — H25812 Combined forms of age-related cataract, left eye: Secondary | ICD-10-CM | POA: Diagnosis not present

## 2020-09-26 DIAGNOSIS — H2512 Age-related nuclear cataract, left eye: Secondary | ICD-10-CM | POA: Diagnosis not present

## 2020-10-06 DIAGNOSIS — E1165 Type 2 diabetes mellitus with hyperglycemia: Secondary | ICD-10-CM | POA: Diagnosis not present

## 2020-10-10 DIAGNOSIS — H2511 Age-related nuclear cataract, right eye: Secondary | ICD-10-CM | POA: Diagnosis not present

## 2020-10-10 DIAGNOSIS — H25811 Combined forms of age-related cataract, right eye: Secondary | ICD-10-CM | POA: Diagnosis not present

## 2020-10-10 DIAGNOSIS — H25041 Posterior subcapsular polar age-related cataract, right eye: Secondary | ICD-10-CM | POA: Diagnosis not present

## 2020-10-30 ENCOUNTER — Other Ambulatory Visit: Payer: Self-pay

## 2020-10-30 ENCOUNTER — Telehealth: Payer: Self-pay | Admitting: Internal Medicine

## 2020-10-30 ENCOUNTER — Ambulatory Visit: Payer: Medicare PPO | Admitting: Internal Medicine

## 2020-10-30 ENCOUNTER — Encounter: Payer: Self-pay | Admitting: Internal Medicine

## 2020-10-30 VITALS — BP 110/80 | HR 93 | Temp 98.1°F | Ht 64.0 in | Wt 207.0 lb

## 2020-10-30 DIAGNOSIS — L237 Allergic contact dermatitis due to plants, except food: Secondary | ICD-10-CM | POA: Diagnosis not present

## 2020-10-30 DIAGNOSIS — Z7184 Encounter for health counseling related to travel: Secondary | ICD-10-CM | POA: Diagnosis not present

## 2020-10-30 DIAGNOSIS — E1169 Type 2 diabetes mellitus with other specified complication: Secondary | ICD-10-CM | POA: Diagnosis not present

## 2020-10-30 DIAGNOSIS — Z6835 Body mass index (BMI) 35.0-35.9, adult: Secondary | ICD-10-CM

## 2020-10-30 DIAGNOSIS — E785 Hyperlipidemia, unspecified: Secondary | ICD-10-CM

## 2020-10-30 DIAGNOSIS — N184 Chronic kidney disease, stage 4 (severe): Secondary | ICD-10-CM | POA: Diagnosis not present

## 2020-10-30 DIAGNOSIS — I10 Essential (primary) hypertension: Secondary | ICD-10-CM

## 2020-10-30 MED ORDER — AZITHROMYCIN 250 MG PO TABS: ORAL_TABLET | ORAL | 0 refills | Status: DC

## 2020-10-30 MED ORDER — PREDNISONE 10 MG PO TABS: ORAL_TABLET | ORAL | 0 refills | Status: DC

## 2020-10-30 NOTE — Telephone Encounter (Signed)
[name] [phone]  [name] called to say she has poison oak or ivy on her ankle and it looks really angry, she would like to come in this afternoon for you to look at it.

## 2020-10-30 NOTE — Telephone Encounter (Signed)
Scheduled at 2:45pm.

## 2020-10-30 NOTE — Telephone Encounter (Signed)
OK 

## 2020-10-31 ENCOUNTER — Encounter: Payer: Self-pay | Admitting: Pulmonary Disease

## 2020-10-31 ENCOUNTER — Ambulatory Visit: Payer: Medicare PPO | Admitting: Pulmonary Disease

## 2020-10-31 VITALS — BP 128/72 | HR 92 | Temp 98.0°F | Ht 64.0 in | Wt 214.6 lb

## 2020-10-31 DIAGNOSIS — G4733 Obstructive sleep apnea (adult) (pediatric): Secondary | ICD-10-CM | POA: Diagnosis not present

## 2020-10-31 NOTE — Progress Notes (Signed)
[facility]  Chief Complaint  Patient presents with  . Follow-up    2 Year f/u - Doing good  - Wears cpap 8 hrs/night on average    Constitutional:  BP 128/72   Pulse 92   Temp 98 F (36.7 C)   Ht 5\' 4"  (1.626 m)   Wt 214 lb 9.6 oz (97.3 kg)   SpO2 96%   BMI 36.84 kg/m   Past Medical History:  Allergic rhinitis, Carpal tunnel, CKD, HLD, DM type 2, Neuropathy, HTN, GERD, Goiter, HLD, Osteopenia, CKD 4  Past Surgical History:  Her  has a past surgical history that includes Ganglion cyst excision (0347); Cesarean section (1983); Dilation and curettage of uterus (1988); Colonoscopy with propofol (N/A, 07/09/2018); and polypectomy (07/09/2018).  Brief Summary:  [name] is a 69 y.o. female with obstructive sleep apnea.      Subjective:   I last saw her in July 2019.  She uses CPAP nightly.  Has nasal pillows mask.  No issues with mask fit.  Not having sinus congestion, sore throat, dry mouth or aerophagia.  Goes to bed between 9 and midnight.  Falls asleep quickly.  Wakes up 2 to 3 times to use the bathroom.  Gets out of bed between 7 and 8 am.  Feels rested during the day.  CPAP App on her phone reviewed - average AHI < 5 with current settings.  She is due to have f/u with cardiology at [facility].  Recent testing showed she has an area of hypokinesis in her myocardium.  She got influenza vaccine.  She completed initial Otho.  She is scheduled to get booster, and had question about whether she could mix and match COVID vaccines.  Physical Exam:   Appearance - well kempt   ENMT - no sinus tenderness, no oral exudate, no LAN, Mallampati 2 airway, no stridor  Respiratory - equal breath sounds bilaterally, no wheezing or rales  CV - s1s2 regular rate and rhythm, no murmurs  Ext - no clubbing, no edema  Skin - no rashes  Psych - normal mood and affect   Sleep Tests:   HST 09/12/16 >> AHI 30.1, SaO2  low 74%  Auto CPAP 06/05/18 to 07/04/18 >> used on 30 of 30 nights with average 7 hrs 34 min.  Average AHI 0.9 with median CPAP 8 and 95 th percentile CPAP 11 cm H2O  Social History:  She  reports that she has never smoked. She has never used smokeless tobacco. She reports that she does not drink alcohol and does not use drugs.  Family History:  Her family history includes COPD in her mother; Cancer in her maternal grandfather; Diabetes in her father and paternal grandmother; Heart disease in her father and sister; Hyperlipidemia in her father; Hypertension in her mother; Thyroid disease in her mother.    Labs:   CMP Latest Ref Rng & Units 03/07/2020 03/09/2019 03/04/2019  Glucose 65 - 99 mg/dL 177(H) - 142(H)  BUN 7 - 25 mg/dL 49(H) - 44(H)  Creatinine 0.50 - 0.99 mg/dL 1.99(H) - 2.13(H)  Sodium 135 - 146 mmol/L 142 - 142  Potassium 3.5 - 5.3 mmol/L 5.5(H) 5.0 5.5(H)  Chloride 98 - 110 mmol/L 109 - 109  CO2 20 - 32 mmol/L 23 - 24  Calcium 8.6 - 10.4 mg/dL 10.0 - 10.0  Total Protein 6.1 - 8.1 g/dL 6.6 - 6.7  Total Bilirubin 0.2 - 1.2 mg/dL 0.4 -  0.5  Alkaline Phos 39 - 117 U/L - - -  AST 10 - 35 U/L 22 - 20  ALT 6 - 29 U/L 24 - 19    CBC Latest Ref Rng & Units 03/07/2020 03/04/2019 02/27/2018  WBC 3.8 - 10.8 Thousand/uL 6.2 6.0 5.9  Hemoglobin 11.7 - 15.5 g/dL 14.7 14.0 13.0  Hematocrit 35 - 45 % 43.0 41.2 38.0  Platelets 140 - 400 Thousand/uL 300 315 296   Lab Results  Component Value Date   TSH 1.96 03/07/2020    Assessment/Plan:   Obstructive sleep apnea. - she is compliant with CPAP and reports benefit - she uses [employer] for her DME - continue auto CPAP range 5 to 15 cm H2O - she should be eligible for a new machine after October 2022  COVID 19 advice. - encouraged her to getting COVID 19 booster, and advised her to try getting Pfizer vaccine since this is what she received initially   Time Spent Involved in Patient Care on Day of Examination:  22 minutes  Follow up:   Patient Instructions  Your CPAP machine was ordered in October 2017 - you should be eligible for a new machine after October 2022  Follow up in 1 year   Medication List:   Allergies as of 10/31/2020      Reactions   Fish Oil Shortness Of Breath   Metformin And Related Other (See Comments)   KIDNEY FAILURE?   Fenofibrate    fever   Cephalexin Diarrhea, Nausea And Vomiting   Ciprocin-fluocin-procin [fluocinolone]    Acute kidney failure   Ciprofloxacin Other (See Comments)   Kidney failure   Insulins Other (See Comments)   Migraines, nausea   Orange Fruit [citrus]    Ramipril Cough   Sulfa Antibiotics Rash      Medication List       Accurate as of October 31, 2020 10:21 AM. If you have any questions, ask your nurse or doctor.        STOP taking these medications   ofloxacin 0.3 % ophthalmic solution Commonly known as: Ocuflox Stopped by: [name], MD     TAKE these medications   aspirin 81 MG chewable tablet Chew 81 mg by mouth daily.   atenolol 100 MG tablet Commonly known as: TENORMIN Take 100 mg by mouth daily.   atorvastatin 40 MG tablet Commonly known as: LIPITOR TAKE 1 TABLET BY MOUTH ONCE DAILY   azithromycin 250 MG tablet Commonly known as: Zithromax Z-Pak Take 2 tabs day 1 followed by one tab days 2-5   B-D UF III MINI PEN NEEDLES 31G X 5 MM Misc Generic drug: Insulin Pen Needle   cholecalciferol 1000 units tablet Commonly known as: VITAMIN D Take 1,000 Units by mouth 3 (three) times daily.   febuxostat 40 MG tablet Commonly known as: ULORIC Take 40 mg by mouth daily.   glucose blood test strip Commonly known as: OneTouch Verio Check blood sugar 3 x daily   insulin lispro 100 UNIT/ML injection Commonly known as: HUMALOG Inject into the skin 3 (three) times daily before meals.   ketoconazole 2 % cream Commonly known as: NIZORAL Apply 1 application topically daily.   loratadine 10 MG tablet Commonly known as: CLARITIN Take 10  mg by mouth as needed.   multivitamin capsule Take 1 capsule by mouth daily.   NOVOLIN N [place] Inject 18 Units into the skin daily.   onetouch ultrasoft lancets Use as instructed   predniSONE 10 MG  tablet Commonly known as: DELTASONE Take in tapering course as directed 6-5-4-3-2-1   promethazine 25 MG tablet Commonly known as: PHENERGAN Take 25 mg by mouth daily as needed for nausea.   sodium bicarbonate 650 MG tablet Take 650 mg by mouth 4 (four) times daily.   TURMERIC PO Take 665 mg by mouth daily.       Signature:  [name], MD [place] Pager - [phone] 10/31/2020, 10:21 AM

## 2020-10-31 NOTE — Patient Instructions (Addendum)
Your CPAP machine was ordered in October 2017 - you should be eligible for a new machine after October 2022  Follow up in 1 year

## 2020-11-01 DIAGNOSIS — G4733 Obstructive sleep apnea (adult) (pediatric): Secondary | ICD-10-CM | POA: Diagnosis not present

## 2020-11-09 DIAGNOSIS — R9439 Abnormal result of other cardiovascular function study: Secondary | ICD-10-CM | POA: Diagnosis not present

## 2020-11-09 DIAGNOSIS — E1122 Type 2 diabetes mellitus with diabetic chronic kidney disease: Secondary | ICD-10-CM | POA: Diagnosis not present

## 2020-11-09 DIAGNOSIS — I129 Hypertensive chronic kidney disease with stage 1 through stage 4 chronic kidney disease, or unspecified chronic kidney disease: Secondary | ICD-10-CM | POA: Diagnosis not present

## 2020-11-09 DIAGNOSIS — E1121 Type 2 diabetes mellitus with diabetic nephropathy: Secondary | ICD-10-CM | POA: Diagnosis not present

## 2020-11-09 DIAGNOSIS — Z794 Long term (current) use of insulin: Secondary | ICD-10-CM | POA: Diagnosis not present

## 2020-11-09 DIAGNOSIS — I208 Other forms of angina pectoris: Secondary | ICD-10-CM | POA: Diagnosis not present

## 2020-11-09 DIAGNOSIS — R0609 Other forms of dyspnea: Secondary | ICD-10-CM | POA: Diagnosis not present

## 2020-11-09 DIAGNOSIS — R809 Proteinuria, unspecified: Secondary | ICD-10-CM | POA: Diagnosis not present

## 2020-11-09 DIAGNOSIS — R0600 Dyspnea, unspecified: Secondary | ICD-10-CM | POA: Diagnosis not present

## 2020-11-09 DIAGNOSIS — E781 Pure hyperglyceridemia: Secondary | ICD-10-CM | POA: Diagnosis not present

## 2020-11-09 DIAGNOSIS — N184 Chronic kidney disease, stage 4 (severe): Secondary | ICD-10-CM | POA: Diagnosis not present

## 2020-11-11 ENCOUNTER — Other Ambulatory Visit: Payer: Self-pay

## 2020-11-11 ENCOUNTER — Ambulatory Visit: Payer: Medicare PPO | Attending: Internal Medicine

## 2020-11-11 DIAGNOSIS — Z23 Encounter for immunization: Secondary | ICD-10-CM

## 2020-11-11 NOTE — Progress Notes (Signed)
   Covid-19 Vaccination Clinic  Name:  [name]    MRN: [id number] DOB: 07-20-1951  11/11/2020  [name] was observed post Covid-19 immunization for 15 minutes without incident. She was provided with Vaccine Information Sheet and instruction to access the V-Safe system.   [name] was instructed to call 911 with any severe reactions post vaccine: Marland Kitchen Difficulty breathing  . Swelling of face and throat  . A fast heartbeat  . A bad rash all over body  . Dizziness and weakness

## 2020-11-12 NOTE — Patient Instructions (Signed)
It was a pleasure to see you today.  Take prednisone in tapering course as directed starting with 60 mg day 1 and decreasing by 10 mg daily.  Have prescribed Zithromax Z-Pak to take with you on trip to [place] in the near future.  Watch Accu-Cheks while on prednisone.

## 2020-11-12 NOTE — Progress Notes (Signed)
   Subjective:    Patient ID: [name], female    DOB: 17-Aug-1951, 69 y.o.   MRN: [id number]  HPI 69 year old Female with history of chronic kidney disease recently seen by Dr. Dossie Der in August for abdominal wall discomfort.  History of repair of ventral hernia with mesh in November 2010.  Hernia recurred soon after repair and Dr. Dossie Der repaired the recurrence with mesh in December 2011.  She has a history of insulin-dependent diabetes.  Had CT of abdomen and pelvis in August that did not show hernia recurrence.  Apparently has been doing some yard work near a wooded area and developed lesion on her ankle that she believes is poison ivy.  She will be traveling to [place] in the near future.  I think she should take a prescription for antibiotics to have on hand should she need it for respiratory infection.  Have prescribed Zithromax Z-PAK for her.  Dr. Michiel Sites follows her for insulin-dependent diabetes mellitus.  She reports Accu-Cheks have been stable.  Blood pressure stable on Tenormin.  Followed by [facility] for chronic kidney disease.  Review of Systems ankle lesion that she is here for today is itchy.  No fever or chills.     Objective:   Physical Exam  Temperature 98.1 degrees pulse 93 blood pressure 110/80 pulse oximetry 97% weight 207 pounds height 5 feet 4 inches BMI 35.53  Has a lesion on her ankle that is linear, erythematous, and without significant drainage      Assessment & Plan:  Contact dermatitis-likely poison ivy-prescribed prednisone in tapering course starting with 60 mg day 1 and decreasing by 10 mg daily i.e. 6-5-4-3-2-1 taper.  We will need to watch Accu-Cheks during course of prednisone.  Travel to [place] in the near future-prescribe Zithromax Z-PAK to take on trip with her.  Insulin-dependent diabetes mellitus  History of chronic kidney disease followed by Nephrology  Hyperlipidemia-treated with statin  medication  Essential hypertension treated with Tenormin and stable

## 2020-11-20 ENCOUNTER — Encounter (HOSPITAL_COMMUNITY): Payer: Self-pay | Admitting: *Deleted

## 2020-11-20 NOTE — Progress Notes (Signed)
Received referral from Dr. Cloretta Ned at [facility] for this pt to participate in Cardiac Rehab with the diagnosis of Stable Angina. Pt completed initial consult after abnormal stress test on 11/11.  Pt completed a stress test as a part of renal transplant evaluation.  Electing to treat medically at this time. Pt desires to begin participating in January.pt is appropriate to participate in cardiac rehab with the THR 120 listed per MD.  Mindful that pt anginal equivalent is shortness of breath. Will forward this referral to support staff to verify insurance benefits and schedule per pt preference. Cherre Huger, BSN Cardiac and Training and development officer

## 2020-12-01 ENCOUNTER — Telehealth (HOSPITAL_COMMUNITY): Payer: Self-pay | Admitting: Internal Medicine

## 2020-12-01 ENCOUNTER — Telehealth (HOSPITAL_COMMUNITY): Payer: Self-pay

## 2020-12-01 NOTE — Telephone Encounter (Signed)
Called patient to see if she was interested in participating in the Cardiac Rehab Program. Patient stated yes. Patient will come in for orientation on 01/04/21 @ 9AM and will attend the 9AM exercise class.  Tourist information centre manager.

## 2020-12-18 ENCOUNTER — Other Ambulatory Visit: Payer: Self-pay | Admitting: Internal Medicine

## 2020-12-18 DIAGNOSIS — Z1231 Encounter for screening mammogram for malignant neoplasm of breast: Secondary | ICD-10-CM

## 2020-12-29 ENCOUNTER — Telehealth (HOSPITAL_COMMUNITY): Payer: Self-pay

## 2021-01-03 ENCOUNTER — Telehealth (HOSPITAL_COMMUNITY): Payer: Self-pay | Admitting: *Deleted

## 2021-01-03 DIAGNOSIS — M65331 Trigger finger, right middle finger: Secondary | ICD-10-CM | POA: Diagnosis not present

## 2021-01-03 NOTE — Telephone Encounter (Signed)
Cardiac Rehab - Pharmacy Resident Documentation   Patient unable to be reached after three call attempts. Please complete allergy verification and medication review during patient's cardiac rehab appointment.     

## 2021-01-03 NOTE — Telephone Encounter (Signed)
Called pt and completed health history for Cardiac Rehab and given reminder of orientation appointment time. We discussed to wear mask, Covid symptoms, proper shoes, directions to the department and our contact number. Pt voices understanding.

## 2021-01-04 ENCOUNTER — Other Ambulatory Visit: Payer: Self-pay

## 2021-01-04 ENCOUNTER — Encounter (HOSPITAL_COMMUNITY): Payer: Self-pay

## 2021-01-04 ENCOUNTER — Encounter (HOSPITAL_COMMUNITY)
Admission: RE | Admit: 2021-01-04 | Discharge: 2021-01-04 | Disposition: A | Discharge status: Home / Self Care | Payer: Medicare PPO | Source: Ambulatory Visit | Attending: Cardiology | Admitting: Cardiology

## 2021-01-04 VITALS — BP 148/80 | Ht 64.0 in | Wt 221.6 lb

## 2021-01-04 DIAGNOSIS — Z7982 Long term (current) use of aspirin: Secondary | ICD-10-CM | POA: Insufficient documentation

## 2021-01-04 DIAGNOSIS — I208 Other forms of angina pectoris: Secondary | ICD-10-CM

## 2021-01-04 DIAGNOSIS — Z794 Long term (current) use of insulin: Secondary | ICD-10-CM | POA: Insufficient documentation

## 2021-01-04 DIAGNOSIS — E119 Type 2 diabetes mellitus without complications: Secondary | ICD-10-CM | POA: Diagnosis not present

## 2021-01-04 DIAGNOSIS — Z79899 Other long term (current) drug therapy: Secondary | ICD-10-CM | POA: Insufficient documentation

## 2021-01-04 DIAGNOSIS — I10 Essential (primary) hypertension: Secondary | ICD-10-CM | POA: Diagnosis not present

## 2021-01-04 LAB — GLUCOSE, CAPILLARY: Glucose-Capillary: 161 mg/dL — ABNORMAL HIGH (ref 70–99)

## 2021-01-04 NOTE — Progress Notes (Addendum)
Cardiac Rehab Medication Review by a Nurse  Does the patient  feel that his/her medications are working for him/her?  yes  Has the patient been experiencing any side effects to the medications prescribed?  no  Does the patient measure his/her own blood pressure or blood glucose at home?  yes   Does the patient have any problems obtaining medications due to transportation or finances?   no  Understanding of regimen: good Understanding of indications: good Potential of compliance: good    Nurse comments: Eeva is taking her medications as prescribed and has a good understanding of why she takes them. Jannine has a CBG meter and a blood pressure cuff at home.     Harrell Gave RN BSN 01/04/2021 9:39 AM

## 2021-01-04 NOTE — Progress Notes (Signed)
Cardiac Individual Treatment Plan  Patient Details  Name: [name] MRN: [id number] Date of Birth: May 21, 1951 Referring Provider:   Flowsheet Row CARDIAC REHAB PHASE II ORIENTATION from 01/04/2021 in [place]  Referring Provider Dr Lolita Patella MD, Dr Fransico Him MD, Covering      Initial Encounter Date:  [facility] from 01/04/2021 in [place]  Date 01/04/21      Visit Diagnosis: Stable angina [facility])  Patient's Home Medications on Admission:  Current Outpatient Medications:  .  Ascorbic Acid (VITAMIN C) 500 MG CAPS, Take 500 mg by mouth daily., Disp: , Rfl:  .  aspirin 81 MG chewable tablet, Chew 81 mg by mouth daily., Disp: , Rfl:  .  atenolol (TENORMIN) 100 MG tablet, Take 100 mg by mouth daily., Disp: , Rfl:  .  atorvastatin (LIPITOR) 40 MG tablet, TAKE 1 TABLET BY MOUTH ONCE DAILY, Disp: 30 tablet, Rfl: 11 .  B-D UF III MINI PEN NEEDLES 31G X 5 MM MISC, , Disp: , Rfl:  .  Cholecalciferol (VITAMIN D3 PO), Take 300 Units by mouth daily., Disp: , Rfl:  .  ezetimibe (ZETIA) 10 MG tablet, Take 10 mg by mouth daily., Disp: , Rfl:  .  febuxostat (ULORIC) 40 MG tablet, Take 40 mg by mouth daily. , Disp: , Rfl:  .  glucose blood (ONETOUCH VERIO) test strip, Check blood sugar 3 x daily, Disp: 100 each, Rfl: 6 .  insulin lispro (HUMALOG) 100 UNIT/ML injection, Inject into the skin daily. Before meals for CBG greater then 250, Disp: , Rfl:  .  Insulin NPH Human, Isophane, (NOVOLIN N [place]), Inject 20 Units into the skin daily., Disp: , Rfl:  .  ketoconazole (NIZORAL) 2 % cream, Apply 1 application topically daily., Disp: 15 g, Rfl: 11 .  Lancets (ONETOUCH ULTRASOFT) lancets, Use as instructed, Disp: 100 each, Rfl: 12 .  loratadine (CLARITIN) 10 MG tablet, Take 10 mg by mouth as needed. , Disp: , Rfl:  .  Multiple Vitamin (MULTIVITAMIN) capsule, Take 1 capsule by mouth  daily., Disp: , Rfl:  .  nitroGLYCERIN (NITROSTAT) 0.4 MG SL tablet, Place 0.4 mg under the tongue every 5 (five) minutes as needed for chest pain., Disp: , Rfl:  .  promethazine (PHENERGAN) 25 MG tablet, Take 25 mg by mouth daily as needed for nausea., Disp: , Rfl:  .  sodium bicarbonate 650 MG tablet, Take 650 mg by mouth 4 (four) times daily., Disp: , Rfl:  .  cholecalciferol (VITAMIN D) 1000 units tablet, Take 1,000 Units by mouth 3 (three) times daily.  (Patient not taking: Reported on 01/04/2021), Disp: , Rfl:   Past Medical History: Past Medical History:  Diagnosis Date  . Allergic rhinitis   . Carbuncle, trunk   . Carpal tunnel syndrome   . Chronic kidney disease   . Combined hyperlipidemia associated with type 2 diabetes mellitus ([place])   . Complication of anesthesia   . Cyclical vomiting   . Dependent edema   . Diabetes mellitus type II   . Diabetic autonomic neuropathy ([place])   . Diabetic peripheral neuropathy associated with type 2 diabetes mellitus ([place])   . Dyspepsia   . Essential hypertension, benign   . GERD (gastroesophageal reflux disease)   . Goiter   . Hypercholesteremia   . Menopause   . Obesity   . OSA (obstructive sleep apnea) 09/23/2016  . Osteopenia   .  PONV (postoperative nausea and vomiting)   . Tachycardia   . Type II or unspecified type diabetes mellitus without mention of complication, not stated as uncontrolled     Tobacco Use: Social History   Tobacco Use  Smoking Status Never Smoker  Smokeless Tobacco Never Used    Labs: Recent Review Flowsheet Data    Labs for ITP Cardiac and Pulmonary Rehab Latest Ref Rng & Units 11/18/2014 02/23/2015 02/27/2018 03/04/2019 03/07/2020   Cholestrol <200 mg/dL 182 216(H) 194 206(H) 232(H)   LDLCALC mg/dL (calc) 88 NOT CALC 105(H) - -   HDL > OR = 50 mg/dL 38(L) 35(L) 32(L) 34(L) 36(L)   Trlycerides <150 mg/dL 282(H) 407(H) 398(H) 418(H) 646(H)   Hemoglobin A1c <5.7 % of total Hgb - 6.2(H) 7.2(H) 7.7(H) 8.3(H)       Capillary Blood Glucose: Lab Results  Component Value Date   GLUCAP 161 (H) 01/04/2021   GLUCAP 121 (H) 07/09/2018   GLUCAP 115 (H) 03/02/2009   GLUCAP 99 09/22/2008   GLUCAP 107 (H) 09/22/2008     Exercise Target Goals: Exercise Program Goal: Individual exercise prescription set using results from initial 6 min walk test and THRR while considering  patient's activity barriers and safety.   Exercise Prescription Goal: Starting with aerobic activity 30 plus minutes a day, 3 days per week for initial exercise prescription. Provide home exercise prescription and guidelines that participant acknowledges understanding prior to discharge.  Activity Barriers & Risk Stratification:  Activity Barriers & Cardiac Risk Stratification - 01/04/21 1200      Activity Barriers & Cardiac Risk Stratification   Activity Barriers Deconditioning;Shortness of Breath;Balance Concerns;Chest Pain/Angina    Cardiac Risk Stratification High           6 Minute Walk:  6 Minute Walk    Row Name 01/04/21 0948         6 Minute Walk   Phase Initial     Distance 1540 feet     Walk Time 6 minutes     # of Rest Breaks 0     MPH 2.92     METS 3.26     RPE 12     Perceived Dyspnea  2     VO2 Peak 11.4     Symptoms Yes (comment)     Comments SOB, RPD=2     Resting HR 71 bpm     Resting BP 148/80     Resting Oxygen Saturation  96 %     Exercise Oxygen Saturation  during 6 min walk 92 %     Max Ex. HR 109 bpm     Max Ex. BP 168/80     2 Minute Post BP 158/80            Oxygen Initial Assessment:   Oxygen Re-Evaluation:   Oxygen Discharge (Final Oxygen Re-Evaluation):   Initial Exercise Prescription:  Initial Exercise Prescription - 01/04/21 1200      Date of Initial Exercise RX and Referring Provider   Date 01/04/21    Referring Provider Dr Lolita Patella MD, Dr Fransico Him MD, Covering    Expected Discharge Date 03/02/21      Bike   Level 1    Minutes 15    METs 2       NuStep   Level 2    SPM 85    Minutes 15    METs 1.7      Prescription Details   Frequency (times per week) 3  Duration Progress to 30 minutes of continuous aerobic without signs/symptoms of physical distress      Intensity   THRR 40-80% of Max Heartrate 61-122    Ratings of Perceived Exertion 11-13    Perceived Dyspnea 0-4      Progression   Progression Continue progressive overload as per policy without signs/symptoms or physical distress.      Resistance Training   Training Prescription Yes    Weight 3    Reps 10-15           Perform Capillary Blood Glucose checks as needed.  Exercise Prescription Changes:   Exercise Comments:   Exercise Goals and Review:   Exercise Goals    Row Name 01/04/21 1217             Exercise Goals   Increase Physical Activity Yes       Intervention Provide advice, education, support and counseling about physical activity/exercise needs.;Develop an individualized exercise prescription for aerobic and resistive training based on initial evaluation findings, risk stratification, comorbidities and participant's personal goals.       Expected Outcomes Short Term: Attend rehab on a regular basis to increase amount of physical activity.;Long Term: Add in home exercise to make exercise part of routine and to increase amount of physical activity.;Long Term: Exercising regularly at least 3-5 days a week.       Increase Strength and Stamina Yes       Intervention Provide advice, education, support and counseling about physical activity/exercise needs.;Develop an individualized exercise prescription for aerobic and resistive training based on initial evaluation findings, risk stratification, comorbidities and participant's personal goals.       Expected Outcomes Short Term: Increase workloads from initial exercise prescription for resistance, speed, and METs.;Short Term: Perform resistance training exercises routinely during rehab and add in  resistance training at home;Long Term: Improve cardiorespiratory fitness, muscular endurance and strength as measured by increased METs and functional capacity (6MWT)       Able to understand and use rate of perceived exertion (RPE) scale Yes       Intervention Provide education and explanation on how to use RPE scale       Expected Outcomes Short Term: Able to use RPE daily in rehab to express subjective intensity level;Long Term:  Able to use RPE to guide intensity level when exercising independently       Able to understand and use Dyspnea scale Yes       Intervention Provide education and explanation on how to use Dyspnea scale       Expected Outcomes Short Term: Able to use Dyspnea scale daily in rehab to express subjective sense of shortness of breath during exertion;Long Term: Able to use Dyspnea scale to guide intensity level when exercising independently       Knowledge and understanding of Target Heart Rate Range (THRR) Yes       Intervention Provide education and explanation of THRR including how the numbers were predicted and where they are located for reference       Expected Outcomes Short Term: Able to state/look up THRR;Short Term: Able to use daily as guideline for intensity in rehab;Long Term: Able to use THRR to govern intensity when exercising independently       Understanding of Exercise Prescription Yes       Intervention Provide education, explanation, and written materials on patient's individual exercise prescription       Expected Outcomes Short Term: Able to explain program exercise  prescription;Long Term: Able to explain home exercise prescription to exercise independently              Exercise Goals Re-Evaluation :    Discharge Exercise Prescription (Final Exercise Prescription Changes):   Nutrition:  Target Goals: Understanding of nutrition guidelines, daily intake of sodium 1500mg , cholesterol 200mg , calories 30% from fat and 7% or less from saturated fats,  daily to have 5 or more servings of fruits and vegetables.  Biometrics:  Pre Biometrics - 01/04/21 1221      Pre Biometrics   Height 5\' 4"  (1.626 m)    Weight 100.5 kg    Waist Circumference 51 inches    Hip Circumference 50 inches    Waist to Hip Ratio 1.02 %    BMI (Calculated) 38.01    Triceps Skinfold 42 mm    % Body Fat 52.5 %    Grip Strength 35 kg    Flexibility 10.5 in    Single Leg Stand 3.53 seconds            Nutrition Therapy Plan and Nutrition Goals:   Nutrition Assessments:  MEDIFICTS Score Key:  ?70 Need to make dietary changes   40-70 Heart Healthy Diet  ? 40 Therapeutic Level Cholesterol Diet   Picture Your Plate Scores:  <25 Unhealthy dietary pattern with much room for improvement.  41-50 Dietary pattern unlikely to meet recommendations for good health and room for improvement.  51-60 More healthful dietary pattern, with some room for improvement.   >60 Healthy dietary pattern, although there may be some specific behaviors that could be improved.    Nutrition Goals Re-Evaluation:   Nutrition Goals Discharge (Final Nutrition Goals Re-Evaluation):   Psychosocial: Target Goals: Acknowledge presence or absence of significant depression and/or stress, maximize coping skills, provide positive support system. Participant is able to verbalize types and ability to use techniques and skills needed for reducing stress and depression.  Initial Review & Psychosocial Screening:  Initial Psych Review & Screening - 01/04/21 1011      Initial Review   Current issues with None Identified      Family Dynamics   Good Support System? Yes    Comments [name] feels supported by her husband and daughter.  Pt daughter lives in [place]      Barriers   Psychosocial barriers to participate in program There are no identifiable barriers or psychosocial needs.;The patient should benefit from training in stress management and relaxation.      Screening  Interventions   Interventions Encouraged to exercise           Quality of Life Scores:  Quality of Life - 01/04/21 1224      Quality of Life   Select Quality of Life      Quality of Life Scores   Health/Function Pre 25.23 %    Socioeconomic Pre 24.86 %    Psych/Spiritual Pre 27.21 %    Family Pre 28.8 %    GLOBAL Pre 26.09 %          Scores of 19 and below usually indicate a poorer quality of life in these areas.  A difference of  2-3 points is a clinically meaningful difference.  A difference of 2-3 points in the total score of the Quality of Life Index has been associated with significant improvement in overall quality of life, self-image, physical symptoms, and general health in studies assessing change in quality of life.  PHQ-9: Recent Review Flowsheet Data  Depression screen [facility] 2/9 01/04/2021 03/08/2019 03/03/2018 08/30/2013 07/04/2013   Decreased Interest 0 0 0 0 0   Down, Depressed, Hopeless 0 0 0 0 0   PHQ - 2 Score 0 0 0 0 0   Altered sleeping 0 - - - -   Tired, decreased energy 1 - - - -   Change in appetite 0 - - - -   Feeling bad or failure about yourself  0 - - - -   Trouble concentrating 0 - - - -   Moving slowly or fidgety/restless 0 - - - -   Suicidal thoughts 0 - - - -   PHQ-9 Score 1 - - - -   Difficult doing work/chores Not difficult at all - - - -     Interpretation of Total Score  Total Score Depression Severity:  1-4 = Minimal depression, 5-9 = Mild depression, 10-14 = Moderate depression, 15-19 = Moderately severe depression, 20-27 = Severe depression   Psychosocial Evaluation and Intervention:   Psychosocial Re-Evaluation:   Psychosocial Discharge (Final Psychosocial Re-Evaluation):   Vocational Rehabilitation: Provide vocational rehab assistance to qualifying candidates.   Vocational Rehab Evaluation & Intervention:  Vocational Rehab - 01/04/21 1418      Initial Vocational Rehab Evaluation & Intervention   Assessment shows need for  Vocational Rehabilitation No   Janel is retired and does not need vocational rehab at this time          Education: Education Goals: Education classes will be provided on a weekly basis, covering required topics. Participant will state understanding/return demonstration of topics presented.  Learning Barriers/Preferences:  Learning Barriers/Preferences - 01/04/21 1225      Learning Barriers/Preferences   Learning Barriers Sight   Wears Glasses   Learning Preferences Video;Pictoral;Skilled Demonstration;Computer/Internet           Education Topics: Hypertension, Hypertension Reduction -Define heart disease and high blood pressure. Discus how high blood pressure affects the body and ways to reduce high blood pressure.   Exercise and Your Heart -Discuss why it is important to exercise, the FITT principles of exercise, normal and abnormal responses to exercise, and how to exercise safely.   Angina -Discuss definition of angina, causes of angina, treatment of angina, and how to decrease risk of having angina.   Cardiac Medications -Review what the following cardiac medications are used for, how they affect the body, and side effects that may occur when taking the medications.  Medications include Aspirin, Beta blockers, calcium channel blockers, ACE Inhibitors, angiotensin receptor blockers, diuretics, digoxin, and antihyperlipidemics.   Congestive Heart Failure -Discuss the definition of CHF, how to live with CHF, the signs and symptoms of CHF, and how keep track of weight and sodium intake.   Heart Disease and Intimacy -Discus the effect sexual activity has on the heart, how changes occur during intimacy as we age, and safety during sexual activity.   Smoking Cessation / COPD -Discuss different methods to quit smoking, the health benefits of quitting smoking, and the definition of COPD.   Nutrition I: Fats -Discuss the types of cholesterol, what cholesterol does to  the heart, and how cholesterol levels can be controlled.   Nutrition II: Labels -Discuss the different components of food labels and how to read food label   Heart Parts/Heart Disease and PAD -Discuss the anatomy of the heart, the pathway of blood circulation through the heart, and these are affected by heart disease.   Stress I: Signs and Symptoms -Discuss the  causes of stress, how stress may lead to anxiety and depression, and ways to limit stress.   Stress II: Relaxation -Discuss different types of relaxation techniques to limit stress.   Warning Signs of Stroke / TIA -Discuss definition of a stroke, what the signs and symptoms are of a stroke, and how to identify when someone is having stroke.   Knowledge Questionnaire Score:  Knowledge Questionnaire Score - 01/04/21 1227      Knowledge Questionnaire Score   Pre Score 20/24           Core Components/Risk Factors/Patient Goals at Admission:  Personal Goals and Risk Factors at Admission - 01/04/21 1232      Core Components/Risk Factors/Patient Goals on Admission    Weight Management Yes;Obesity;Weight Loss    Intervention Weight Management: Develop a combined nutrition and exercise program designed to reach desired caloric intake, while maintaining appropriate intake of nutrient and fiber, sodium and fats, and appropriate energy expenditure required for the weight goal.;Weight Management: Provide education and appropriate resources to help participant work on and attain dietary goals.;Weight Management/Obesity: Establish reasonable short term and long term weight goals.;Obesity: Provide education and appropriate resources to help participant work on and attain dietary goals.    Admit Weight 221 lb 9 oz (100.5 kg)    Goal Weight: Long Term 150 lb (68 kg)   Patients Goal   Expected Outcomes Short Term: Continue to assess and modify interventions until short term weight is achieved;Long Term: Adherence to nutrition and  physical activity/exercise program aimed toward attainment of established weight goal;Weight Maintenance: Understanding of the daily nutrition guidelines, which includes 25-35% calories from fat, 7% or less cal from saturated fats, less than 200mg  cholesterol, less than 1.5gm of sodium, & 5 or more servings of fruits and vegetables daily;Weight Loss: Understanding of general recommendations for a balanced deficit meal plan, which promotes 1-2 lb weight loss per week and includes a negative energy balance of [phone] kcal/d;Understanding recommendations for meals to include 15-35% energy as protein, 25-35% energy from fat, 35-60% energy from carbohydrates, less than 200mg  of dietary cholesterol, 20-35 gm of total fiber daily;Understanding of distribution of calorie intake throughout the day with the consumption of 4-5 meals/snacks    Diabetes Yes    Intervention Provide education about signs/symptoms and action to take for hypo/hyperglycemia.;Provide education about proper nutrition, including hydration, and aerobic/resistive exercise prescription along with prescribed medications to achieve blood glucose in normal ranges: Fasting glucose 65-99 mg/dL    Expected Outcomes Short Term: Participant verbalizes understanding of the signs/symptoms and immediate care of hyper/hypoglycemia, proper foot care and importance of medication, aerobic/resistive exercise and nutrition plan for blood glucose control.;Long Term: Attainment of HbA1C < 7%.    Hypertension Yes    Intervention Provide education on lifestyle modifcations including regular physical activity/exercise, weight management, moderate sodium restriction and increased consumption of fresh fruit, vegetables, and low fat dairy, alcohol moderation, and smoking cessation.;Monitor prescription use compliance.    Expected Outcomes Short Term: Continued assessment and intervention until BP is < 140/14mm HG in hypertensive participants. < 130/3mm HG in hypertensive  participants with diabetes, heart failure or chronic kidney disease.;Long Term: Maintenance of blood pressure at goal levels.    Lipids Yes    Intervention Provide education and support for participant on nutrition & aerobic/resistive exercise along with prescribed medications to achieve LDL 70mg , HDL >40mg .    Expected Outcomes Short Term: Participant states understanding of desired cholesterol values and is compliant with medications prescribed. Participant is  following exercise prescription and nutrition guidelines.;Long Term: Cholesterol controlled with medications as prescribed, with individualized exercise RX and with personalized nutrition plan. Value goals: LDL < 70mg , HDL > 40 mg.           Core Components/Risk Factors/Patient Goals Review:    Core Components/Risk Factors/Patient Goals at Discharge (Final Review):    ITP Comments:  ITP Comments    Row Name 01/04/21 0937           ITP Comments Dr Fransico Him MD, Medical Director              Comments: Cheryle Horsfall attended orientation on 01/04/2021 to review rules and guidelines for program.  Completed 6 minute walk test, Intitial ITP, and exercise prescription.  VSS. Telemetry-Sinus Rhythm. Seleta had no pain but did experience some mild shortness of breath this went away with rest.. Safety measures and social distancing in place per CDC guidelines. Valree is being referred to cardiac rehab by Dr Cloretta Ned at [facility].Barnet Pall, RN,BSN 01/04/2021 2:40 PM

## 2021-01-08 ENCOUNTER — Encounter (HOSPITAL_COMMUNITY)
Admission: RE | Admit: 2021-01-08 | Discharge: 2021-01-08 | Disposition: A | Discharge status: Home / Self Care | Payer: Medicare PPO | Source: Ambulatory Visit | Attending: Cardiology | Admitting: Cardiology

## 2021-01-08 ENCOUNTER — Other Ambulatory Visit: Payer: Self-pay

## 2021-01-08 DIAGNOSIS — Z794 Long term (current) use of insulin: Secondary | ICD-10-CM | POA: Diagnosis not present

## 2021-01-08 DIAGNOSIS — I10 Essential (primary) hypertension: Secondary | ICD-10-CM | POA: Diagnosis not present

## 2021-01-08 DIAGNOSIS — Z7982 Long term (current) use of aspirin: Secondary | ICD-10-CM | POA: Diagnosis not present

## 2021-01-08 DIAGNOSIS — I208 Other forms of angina pectoris: Secondary | ICD-10-CM | POA: Diagnosis not present

## 2021-01-08 DIAGNOSIS — E119 Type 2 diabetes mellitus without complications: Secondary | ICD-10-CM | POA: Diagnosis not present

## 2021-01-08 DIAGNOSIS — Z79899 Other long term (current) drug therapy: Secondary | ICD-10-CM | POA: Diagnosis not present

## 2021-01-08 LAB — GLUCOSE, CAPILLARY
Glucose-Capillary: 126 mg/dL — ABNORMAL HIGH (ref 70–99)
Glucose-Capillary: 97 mg/dL (ref 70–99)

## 2021-01-09 NOTE — Progress Notes (Signed)
Cardiac Individual Treatment Plan  Patient Details  Name: [name] MRN: [id number] Date of Birth: April 11, 1951 Referring Provider:   Flowsheet Row CARDIAC REHAB PHASE II ORIENTATION from 01/04/2021 in [place]  Referring Provider Dr Lolita Patella MD, Dr Fransico Him MD, Covering      Initial Encounter Date:  [place] from 01/04/2021 in [place]  Date 01/04/21      Visit Diagnosis: Stable angina [facility])  Patient's Home Medications on Admission:  Current Outpatient Medications:  .  Ascorbic Acid (VITAMIN C) 500 MG CAPS, Take 500 mg by mouth daily., Disp: , Rfl:  .  aspirin 81 MG chewable tablet, Chew 81 mg by mouth daily., Disp: , Rfl:  .  atenolol (TENORMIN) 100 MG tablet, Take 100 mg by mouth daily., Disp: , Rfl:  .  atorvastatin (LIPITOR) 40 MG tablet, TAKE 1 TABLET BY MOUTH ONCE DAILY, Disp: 30 tablet, Rfl: 11 .  B-D UF III MINI PEN NEEDLES 31G X 5 MM MISC, , Disp: , Rfl:  .  cholecalciferol (VITAMIN D) 1000 units tablet, Take 1,000 Units by mouth 3 (three) times daily.  (Patient not taking: Reported on 01/04/2021), Disp: , Rfl:  .  Cholecalciferol (VITAMIN D3 PO), Take 300 Units by mouth daily., Disp: , Rfl:  .  ezetimibe (ZETIA) 10 MG tablet, Take 10 mg by mouth daily., Disp: , Rfl:  .  febuxostat (ULORIC) 40 MG tablet, Take 40 mg by mouth daily. , Disp: , Rfl:  .  glucose blood (ONETOUCH VERIO) test strip, Check blood sugar 3 x daily, Disp: 100 each, Rfl: 6 .  insulin lispro (HUMALOG) 100 UNIT/ML injection, Inject into the skin daily. Before meals for CBG greater then 250, Disp: , Rfl:  .  Insulin NPH Human, Isophane, (NOVOLIN N [place]), Inject 20 Units into the skin daily., Disp: , Rfl:  .  ketoconazole (NIZORAL) 2 % cream, Apply 1 application topically daily., Disp: 15 g, Rfl: 11 .  Lancets (ONETOUCH ULTRASOFT) lancets, Use as instructed, Disp: 100 each, Rfl: 12 .   loratadine (CLARITIN) 10 MG tablet, Take 10 mg by mouth as needed. , Disp: , Rfl:  .  Multiple Vitamin (MULTIVITAMIN) capsule, Take 1 capsule by mouth daily., Disp: , Rfl:  .  nitroGLYCERIN (NITROSTAT) 0.4 MG SL tablet, Place 0.4 mg under the tongue every 5 (five) minutes as needed for chest pain., Disp: , Rfl:  .  promethazine (PHENERGAN) 25 MG tablet, Take 25 mg by mouth daily as needed for nausea., Disp: , Rfl:  .  sodium bicarbonate 650 MG tablet, Take 650 mg by mouth 4 (four) times daily., Disp: , Rfl:   Past Medical History: Past Medical History:  Diagnosis Date  . Allergic rhinitis   . Carbuncle, trunk   . Carpal tunnel syndrome   . Chronic kidney disease   . Combined hyperlipidemia associated with type 2 diabetes mellitus ([place])   . Complication of anesthesia   . Cyclical vomiting   . Dependent edema   . Diabetes mellitus type II   . Diabetic autonomic neuropathy ([place])   . Diabetic peripheral neuropathy associated with type 2 diabetes mellitus ([place])   . Dyspepsia   . Essential hypertension, benign   . GERD (gastroesophageal reflux disease)   . Goiter   . Hypercholesteremia   . Menopause   . Obesity   . OSA (obstructive sleep apnea) 09/23/2016  . Osteopenia   .  PONV (postoperative nausea and vomiting)   . Tachycardia   . Type II or unspecified type diabetes mellitus without mention of complication, not stated as uncontrolled     Tobacco Use: Social History   Tobacco Use  Smoking Status Never Smoker  Smokeless Tobacco Never Used    Labs: Recent Review Flowsheet Data    Labs for ITP Cardiac and Pulmonary Rehab Latest Ref Rng & Units 11/18/2014 02/23/2015 02/27/2018 03/04/2019 03/07/2020   Cholestrol <200 mg/dL 182 216(H) 194 206(H) 232(H)   LDLCALC mg/dL (calc) 88 NOT CALC 105(H) - -   HDL > OR = 50 mg/dL 38(L) 35(L) 32(L) 34(L) 36(L)   Trlycerides <150 mg/dL 282(H) 407(H) 398(H) 418(H) 646(H)   Hemoglobin A1c <5.7 % of total Hgb - 6.2(H) 7.2(H) 7.7(H) 8.3(H)       Capillary Blood Glucose: Lab Results  Component Value Date   GLUCAP 97 01/08/2021   GLUCAP 126 (H) 01/08/2021   GLUCAP 161 (H) 01/04/2021   GLUCAP 121 (H) 07/09/2018   GLUCAP 115 (H) 03/02/2009     Exercise Target Goals: Exercise Program Goal: Individual exercise prescription set using results from initial 6 min walk test and THRR while considering  patient's activity barriers and safety.   Exercise Prescription Goal: Starting with aerobic activity 30 plus minutes a day, 3 days per week for initial exercise prescription. Provide home exercise prescription and guidelines that participant acknowledges understanding prior to discharge.  Activity Barriers & Risk Stratification:  Activity Barriers & Cardiac Risk Stratification - 01/04/21 1200      Activity Barriers & Cardiac Risk Stratification   Activity Barriers Deconditioning;Shortness of Breath;Balance Concerns;Chest Pain/Angina    Cardiac Risk Stratification High           6 Minute Walk:  6 Minute Walk    Row Name 01/04/21 0948         6 Minute Walk   Phase Initial     Distance 1540 feet     Walk Time 6 minutes     # of Rest Breaks 0     MPH 2.92     METS 3.26     RPE 12     Perceived Dyspnea  2     VO2 Peak 11.4     Symptoms Yes (comment)     Comments SOB, RPD=2     Resting HR 71 bpm     Resting BP 148/80     Resting Oxygen Saturation  96 %     Exercise Oxygen Saturation  during 6 min walk 92 %     Max Ex. HR 109 bpm     Max Ex. BP 168/80     2 Minute Post BP 158/80            Oxygen Initial Assessment:   Oxygen Re-Evaluation:   Oxygen Discharge (Final Oxygen Re-Evaluation):   Initial Exercise Prescription:  Initial Exercise Prescription - 01/04/21 1200      Date of Initial Exercise RX and Referring Provider   Date 01/04/21    Referring Provider Dr Lolita Patella MD, Dr Fransico Him MD, Covering    Expected Discharge Date 03/02/21      Bike   Level 1    Minutes 15    METs 2       NuStep   Level 2    SPM 85    Minutes 15    METs 1.7      Prescription Details   Frequency (times per week) 3  Duration Progress to 30 minutes of continuous aerobic without signs/symptoms of physical distress      Intensity   THRR 40-80% of Max Heartrate 61-122    Ratings of Perceived Exertion 11-13    Perceived Dyspnea 0-4      Progression   Progression Continue progressive overload as per policy without signs/symptoms or physical distress.      Resistance Training   Training Prescription Yes    Weight 3    Reps 10-15           Perform Capillary Blood Glucose checks as needed.  Exercise Prescription Changes:  Exercise Prescription Changes    Row Name 01/08/21 1000             Response to Exercise   Blood Pressure (Admit) 122/62       Blood Pressure (Exercise) 160/86       Blood Pressure (Exit) 108/70       Heart Rate (Admit) 72 bpm       Heart Rate (Exercise) 94 bpm       Heart Rate (Exit) 77 bpm       Rating of Perceived Exertion (Exercise) 11       Symptoms None       Comments Pt's first day of exercise in the CRP2 program       Duration Progress to 30 minutes of  aerobic without signs/symptoms of physical distress       Intensity THRR unchanged               Progression   Progression Continue to progress workloads to maintain intensity without signs/symptoms of physical distress.       Average METs 2               Resistance Training   Training Prescription Yes       Weight 3       Reps 10-15       Time 10 Minutes               Interval Training   Interval Training No               Bike   Level 1       Minutes 15       METs 2.1               NuStep   Level 2       SPM 85       Minutes 15       METs 1.9              Exercise Comments:  Exercise Comments    Row Name 01/08/21 1217           Exercise Comments Pt's first day of exercise in the CRP2 program. Pt tolerated session well.              Exercise Goals and  Review:  Exercise Goals    Row Name 01/04/21 1217             Exercise Goals   Increase Physical Activity Yes       Intervention Provide advice, education, support and counseling about physical activity/exercise needs.;Develop an individualized exercise prescription for aerobic and resistive training based on initial evaluation findings, risk stratification, comorbidities and participant's personal goals.       Expected Outcomes Short Term: Attend rehab on a regular basis to increase amount of physical activity.;Long Term: Add in home exercise  to make exercise part of routine and to increase amount of physical activity.;Long Term: Exercising regularly at least 3-5 days a week.       Increase Strength and Stamina Yes       Intervention Provide advice, education, support and counseling about physical activity/exercise needs.;Develop an individualized exercise prescription for aerobic and resistive training based on initial evaluation findings, risk stratification, comorbidities and participant's personal goals.       Expected Outcomes Short Term: Increase workloads from initial exercise prescription for resistance, speed, and METs.;Short Term: Perform resistance training exercises routinely during rehab and add in resistance training at home;Long Term: Improve cardiorespiratory fitness, muscular endurance and strength as measured by increased METs and functional capacity (6MWT)       Able to understand and use rate of perceived exertion (RPE) scale Yes       Intervention Provide education and explanation on how to use RPE scale       Expected Outcomes Short Term: Able to use RPE daily in rehab to express subjective intensity level;Long Term:  Able to use RPE to guide intensity level when exercising independently       Able to understand and use Dyspnea scale Yes       Intervention Provide education and explanation on how to use Dyspnea scale       Expected Outcomes Short Term: Able to use Dyspnea  scale daily in rehab to express subjective sense of shortness of breath during exertion;Long Term: Able to use Dyspnea scale to guide intensity level when exercising independently       Knowledge and understanding of Target Heart Rate Range (THRR) Yes       Intervention Provide education and explanation of THRR including how the numbers were predicted and where they are located for reference       Expected Outcomes Short Term: Able to state/look up THRR;Short Term: Able to use daily as guideline for intensity in rehab;Long Term: Able to use THRR to govern intensity when exercising independently       Understanding of Exercise Prescription Yes       Intervention Provide education, explanation, and written materials on patient's individual exercise prescription       Expected Outcomes Short Term: Able to explain program exercise prescription;Long Term: Able to explain home exercise prescription to exercise independently              Exercise Goals Re-Evaluation :  Exercise Goals Re-Evaluation    [place] Name 01/08/21 1138             Exercise Goal Re-Evaluation   Exercise Goals Review Increase Physical Activity;Increase Strength and Stamina;Able to understand and use rate of perceived exertion (RPE) scale;Knowledge and understanding of Target Heart Rate Range (THRR);Understanding of Exercise Prescription       Comments Pt's first day of exercise in the CRP2 program. Pt understands the RPE scale, THRR, and Exercise Rx.       Expected Outcomes Will continue to montior patient and progress exercise workloads as tolerated.               Discharge Exercise Prescription (Final Exercise Prescription Changes):  Exercise Prescription Changes - 01/08/21 1000      Response to Exercise   Blood Pressure (Admit) 122/62    Blood Pressure (Exercise) 160/86    Blood Pressure (Exit) 108/70    Heart Rate (Admit) 72 bpm    Heart Rate (Exercise) 94 bpm    Heart Rate (Exit) 77 bpm  Rating of Perceived  Exertion (Exercise) 11    Symptoms None    Comments Pt's first day of exercise in the CRP2 program    Duration Progress to 30 minutes of  aerobic without signs/symptoms of physical distress    Intensity THRR unchanged      Progression   Progression Continue to progress workloads to maintain intensity without signs/symptoms of physical distress.    Average METs 2      Resistance Training   Training Prescription Yes    Weight 3    Reps 10-15    Time 10 Minutes      Interval Training   Interval Training No      Bike   Level 1    Minutes 15    METs 2.1      NuStep   Level 2    SPM 85    Minutes 15    METs 1.9           Nutrition:  Target Goals: Understanding of nutrition guidelines, daily intake of sodium 1500mg , cholesterol 200mg , calories 30% from fat and 7% or less from saturated fats, daily to have 5 or more servings of fruits and vegetables.  Biometrics:  Pre Biometrics - 01/04/21 1221      Pre Biometrics   Height 5\' 4"  (1.626 m)    Weight 100.5 kg    Waist Circumference 51 inches    Hip Circumference 50 inches    Waist to Hip Ratio 1.02 %    BMI (Calculated) 38.01    Triceps Skinfold 42 mm    % Body Fat 52.5 %    Grip Strength 35 kg    Flexibility 10.5 in    Single Leg Stand 3.53 seconds            Nutrition Therapy Plan and Nutrition Goals:  Nutrition Therapy & Goals - 01/08/21 1413      Nutrition Therapy   RD appointment deferred Yes           Nutrition Assessments:  MEDIFICTS Score Key:  ?70 Need to make dietary changes   40-70 Heart Healthy Diet  ? 40 Therapeutic Level Cholesterol Diet   Picture Your Plate Scores:  <62 Unhealthy dietary pattern with much room for improvement.  41-50 Dietary pattern unlikely to meet recommendations for good health and room for improvement.  51-60 More healthful dietary pattern, with some room for improvement.   >60 Healthy dietary pattern, although there may be some specific behaviors that  could be improved.    Nutrition Goals Re-Evaluation:   Nutrition Goals Discharge (Final Nutrition Goals Re-Evaluation):   Psychosocial: Target Goals: Acknowledge presence or absence of significant depression and/or stress, maximize coping skills, provide positive support system. Participant is able to verbalize types and ability to use techniques and skills needed for reducing stress and depression.  Initial Review & Psychosocial Screening:  Initial Psych Review & Screening - 01/04/21 1011      Initial Review   Current issues with None Identified      Family Dynamics   Good Support System? Yes    Comments [name] feels supported by her husband and daughter.  Pt daughter lives in [place]      Barriers   Psychosocial barriers to participate in program There are no identifiable barriers or psychosocial needs.;The patient should benefit from training in stress management and relaxation.      Screening Interventions   Interventions Encouraged to exercise  Quality of Life Scores:  Quality of Life - 01/04/21 1224      Quality of Life   Select Quality of Life      Quality of Life Scores   Health/Function Pre 25.23 %    Socioeconomic Pre 24.86 %    Psych/Spiritual Pre 27.21 %    Family Pre 28.8 %    GLOBAL Pre 26.09 %          Scores of 19 and below usually indicate a poorer quality of life in these areas.  A difference of  2-3 points is a clinically meaningful difference.  A difference of 2-3 points in the total score of the Quality of Life Index has been associated with significant improvement in overall quality of life, self-image, physical symptoms, and general health in studies assessing change in quality of life.  PHQ-9: Recent Review Flowsheet Data    Depression screen [facility] 2/9 01/04/2021 03/08/2019 03/03/2018 08/30/2013 07/04/2013   Decreased Interest 0 0 0 0 0   Down, Depressed, Hopeless 0 0 0 0 0   PHQ - 2 Score 0 0 0 0 0   Altered sleeping 0 - - - -   Tired,  decreased energy 1 - - - -   Change in appetite 0 - - - -   Feeling bad or failure about yourself  0 - - - -   Trouble concentrating 0 - - - -   Moving slowly or fidgety/restless 0 - - - -   Suicidal thoughts 0 - - - -   PHQ-9 Score 1 - - - -   Difficult doing work/chores Not difficult at all - - - -     Interpretation of Total Score  Total Score Depression Severity:  1-4 = Minimal depression, 5-9 = Mild depression, 10-14 = Moderate depression, 15-19 = Moderately severe depression, 20-27 = Severe depression   Psychosocial Evaluation and Intervention:   Psychosocial Re-Evaluation:  Psychosocial Re-Evaluation    Row Name 01/08/21 1039             Psychosocial Re-Evaluation   Current issues with None Identified       Interventions Encouraged to attend Cardiac Rehabilitation for the exercise       Continue Psychosocial Services  No Follow up required              Psychosocial Discharge (Final Psychosocial Re-Evaluation):  Psychosocial Re-Evaluation - 01/08/21 1039      Psychosocial Re-Evaluation   Current issues with None Identified    Interventions Encouraged to attend Cardiac Rehabilitation for the exercise    Continue Psychosocial Services  No Follow up required           Vocational Rehabilitation: Provide vocational rehab assistance to qualifying candidates.   Vocational Rehab Evaluation & Intervention:  Vocational Rehab - 01/04/21 1418      Initial Vocational Rehab Evaluation & Intervention   Assessment shows need for Vocational Rehabilitation No   Gala is retired and does not need vocational rehab at this time          Education: Education Goals: Education classes will be provided on a weekly basis, covering required topics. Participant will state understanding/return demonstration of topics presented.  Learning Barriers/Preferences:  Learning Barriers/Preferences - 01/04/21 1225      Learning Barriers/Preferences   Learning Barriers Sight    Wears Glasses   Learning Preferences Video;Pictoral;Skilled Demonstration;Computer/Internet           Education Topics: Hypertension, Hypertension Reduction -Define  heart disease and high blood pressure. Discus how high blood pressure affects the body and ways to reduce high blood pressure.   Exercise and Your Heart -Discuss why it is important to exercise, the FITT principles of exercise, normal and abnormal responses to exercise, and how to exercise safely.   Angina -Discuss definition of angina, causes of angina, treatment of angina, and how to decrease risk of having angina.   Cardiac Medications -Review what the following cardiac medications are used for, how they affect the body, and side effects that may occur when taking the medications.  Medications include Aspirin, Beta blockers, calcium channel blockers, ACE Inhibitors, angiotensin receptor blockers, diuretics, digoxin, and antihyperlipidemics.   Congestive Heart Failure -Discuss the definition of CHF, how to live with CHF, the signs and symptoms of CHF, and how keep track of weight and sodium intake.   Heart Disease and Intimacy -Discus the effect sexual activity has on the heart, how changes occur during intimacy as we age, and safety during sexual activity.   Smoking Cessation / COPD -Discuss different methods to quit smoking, the health benefits of quitting smoking, and the definition of COPD.   Nutrition I: Fats -Discuss the types of cholesterol, what cholesterol does to the heart, and how cholesterol levels can be controlled.   Nutrition II: Labels -Discuss the different components of food labels and how to read food label   Heart Parts/Heart Disease and PAD -Discuss the anatomy of the heart, the pathway of blood circulation through the heart, and these are affected by heart disease.   Stress I: Signs and Symptoms -Discuss the causes of stress, how stress may lead to anxiety and depression, and ways  to limit stress.   Stress II: Relaxation -Discuss different types of relaxation techniques to limit stress.   Warning Signs of Stroke / TIA -Discuss definition of a stroke, what the signs and symptoms are of a stroke, and how to identify when someone is having stroke.   Knowledge Questionnaire Score:  Knowledge Questionnaire Score - 01/04/21 1227      Knowledge Questionnaire Score   Pre Score 20/24           Core Components/Risk Factors/Patient Goals at Admission:  Personal Goals and Risk Factors at Admission - 01/04/21 1232      Core Components/Risk Factors/Patient Goals on Admission    Weight Management Yes;Obesity;Weight Loss    Intervention Weight Management: Develop a combined nutrition and exercise program designed to reach desired caloric intake, while maintaining appropriate intake of nutrient and fiber, sodium and fats, and appropriate energy expenditure required for the weight goal.;Weight Management: Provide education and appropriate resources to help participant work on and attain dietary goals.;Weight Management/Obesity: Establish reasonable short term and long term weight goals.;Obesity: Provide education and appropriate resources to help participant work on and attain dietary goals.    Admit Weight 221 lb 9 oz (100.5 kg)    Goal Weight: Long Term 150 lb (68 kg)   Patients Goal   Expected Outcomes Short Term: Continue to assess and modify interventions until short term weight is achieved;Long Term: Adherence to nutrition and physical activity/exercise program aimed toward attainment of established weight goal;Weight Maintenance: Understanding of the daily nutrition guidelines, which includes 25-35% calories from fat, 7% or less cal from saturated fats, less than 200mg  cholesterol, less than 1.5gm of sodium, & 5 or more servings of fruits and vegetables daily;Weight Loss: Understanding of general recommendations for a balanced deficit meal plan, which promotes 1-2 lb weight  loss per week and includes a negative energy balance of [phone] kcal/d;Understanding recommendations for meals to include 15-35% energy as protein, 25-35% energy from fat, 35-60% energy from carbohydrates, less than 200mg  of dietary cholesterol, 20-35 gm of total fiber daily;Understanding of distribution of calorie intake throughout the day with the consumption of 4-5 meals/snacks    Diabetes Yes    Intervention Provide education about signs/symptoms and action to take for hypo/hyperglycemia.;Provide education about proper nutrition, including hydration, and aerobic/resistive exercise prescription along with prescribed medications to achieve blood glucose in normal ranges: Fasting glucose 65-99 mg/dL    Expected Outcomes Short Term: Participant verbalizes understanding of the signs/symptoms and immediate care of hyper/hypoglycemia, proper foot care and importance of medication, aerobic/resistive exercise and nutrition plan for blood glucose control.;Long Term: Attainment of HbA1C < 7%.    Hypertension Yes    Intervention Provide education on lifestyle modifcations including regular physical activity/exercise, weight management, moderate sodium restriction and increased consumption of fresh fruit, vegetables, and low fat dairy, alcohol moderation, and smoking cessation.;Monitor prescription use compliance.    Expected Outcomes Short Term: Continued assessment and intervention until BP is < 140/10mm HG in hypertensive participants. < 130/88mm HG in hypertensive participants with diabetes, heart failure or chronic kidney disease.;Long Term: Maintenance of blood pressure at goal levels.    Lipids Yes    Intervention Provide education and support for participant on nutrition & aerobic/resistive exercise along with prescribed medications to achieve LDL 70mg , HDL >40mg .    Expected Outcomes Short Term: Participant states understanding of desired cholesterol values and is compliant with medications prescribed.  Participant is following exercise prescription and nutrition guidelines.;Long Term: Cholesterol controlled with medications as prescribed, with individualized exercise RX and with personalized nutrition plan. Value goals: LDL < 70mg , HDL > 40 mg.           Core Components/Risk Factors/Patient Goals Review:   Goals and Risk Factor Review    Row Name 01/08/21 1040             Core Components/Risk Factors/Patient Goals Review   Personal Goals Review Weight Management/Obesity;Lipids;Diabetes;Hypertension       Review Raul started exercise on 01/08/21 Did well with exercise. some moderate BP elvations on the nustep. Shawneen's goals are to get healthy       Expected Outcomes Patirica will continue to participate inphase 2 cardiac rehab for exercise nutrition and lifestyle modification              Core Components/Risk Factors/Patient Goals at Discharge (Final Review):   Goals and Risk Factor Review - 01/08/21 1040      Core Components/Risk Factors/Patient Goals Review   Personal Goals Review Weight Management/Obesity;Lipids;Diabetes;Hypertension    Review [place] started exercise on 01/08/21 Did well with exercise. some moderate BP elvations on the nustep. Dwana's goals are to get healthy    Expected Outcomes Patirica will continue to participate inphase 2 cardiac rehab for exercise nutrition and lifestyle modification           ITP Comments:  ITP Comments    Row Name 01/04/21 0937 01/08/21 1038         ITP Comments Dr Fransico Him MD, Medical Director 30 Day ITP REview. Shawni started exercise on 01/09/20 and did well with exercise             Comments: Mariesha started cardiac rehab on 01/08/21.  Pt tolerated light exercise without difficulty. VSS, telemetry-Sinus Rhythm, asymptomatic.  Medication list reconciled. Pt denies barriers to medicaiton compliance.  PSYCHOSOCIAL ASSESSMENT:  PHQ-1. Pt exhibits positive coping skills, hopeful outlook with supportive family.  No psychosocial needs identified at this time, no psychosocial interventions necessary.    Pt enjoys yard work, drawing and spending time with her friends and family.   Pt oriented to exercise equipment and routine.    Understanding verbalized.Barnet Pall, RN,BSN 01/09/2021 11:04 AM

## 2021-01-10 ENCOUNTER — Encounter (HOSPITAL_COMMUNITY)
Admission: RE | Admit: 2021-01-10 | Discharge: 2021-01-10 | Disposition: A | Discharge status: Home / Self Care | Payer: Medicare PPO | Source: Ambulatory Visit | Attending: Cardiology | Admitting: Cardiology

## 2021-01-10 ENCOUNTER — Other Ambulatory Visit: Payer: Self-pay

## 2021-01-10 DIAGNOSIS — Z794 Long term (current) use of insulin: Secondary | ICD-10-CM | POA: Diagnosis not present

## 2021-01-10 DIAGNOSIS — Z79899 Other long term (current) drug therapy: Secondary | ICD-10-CM | POA: Diagnosis not present

## 2021-01-10 DIAGNOSIS — I208 Other forms of angina pectoris: Secondary | ICD-10-CM | POA: Diagnosis not present

## 2021-01-10 DIAGNOSIS — E119 Type 2 diabetes mellitus without complications: Secondary | ICD-10-CM | POA: Diagnosis not present

## 2021-01-10 DIAGNOSIS — I10 Essential (primary) hypertension: Secondary | ICD-10-CM | POA: Diagnosis not present

## 2021-01-10 DIAGNOSIS — Z7982 Long term (current) use of aspirin: Secondary | ICD-10-CM | POA: Diagnosis not present

## 2021-01-10 LAB — GLUCOSE, CAPILLARY
Glucose-Capillary: 148 mg/dL — ABNORMAL HIGH (ref 70–99)
Glucose-Capillary: 113 mg/dL — ABNORMAL HIGH (ref 70–99)

## 2021-01-12 ENCOUNTER — Encounter (HOSPITAL_COMMUNITY)
Admission: RE | Admit: 2021-01-12 | Discharge: 2021-01-12 | Disposition: A | Discharge status: Home / Self Care | Payer: Medicare PPO | Source: Ambulatory Visit | Attending: Cardiology | Admitting: Cardiology

## 2021-01-12 ENCOUNTER — Other Ambulatory Visit: Payer: Self-pay

## 2021-01-12 VITALS — Wt 221.0 lb

## 2021-01-12 DIAGNOSIS — E119 Type 2 diabetes mellitus without complications: Secondary | ICD-10-CM | POA: Diagnosis not present

## 2021-01-12 DIAGNOSIS — Z794 Long term (current) use of insulin: Secondary | ICD-10-CM | POA: Diagnosis not present

## 2021-01-12 DIAGNOSIS — I208 Other forms of angina pectoris: Secondary | ICD-10-CM | POA: Diagnosis not present

## 2021-01-12 DIAGNOSIS — Z7982 Long term (current) use of aspirin: Secondary | ICD-10-CM | POA: Diagnosis not present

## 2021-01-12 DIAGNOSIS — I10 Essential (primary) hypertension: Secondary | ICD-10-CM | POA: Diagnosis not present

## 2021-01-12 DIAGNOSIS — Z79899 Other long term (current) drug therapy: Secondary | ICD-10-CM | POA: Diagnosis not present

## 2021-01-12 NOTE — Progress Notes (Signed)
[name] 70 y.o. female Nutrition Note Visit Diagnosis: Stable angina [facility])  Past Medical History:  Diagnosis Date  . Allergic rhinitis   . Carbuncle, trunk   . Carpal tunnel syndrome   . Chronic kidney disease   . Combined hyperlipidemia associated with type 2 diabetes mellitus ([place])   . Complication of anesthesia   . Cyclical vomiting   . Dependent edema   . Diabetes mellitus type II   . Diabetic autonomic neuropathy ([place])   . Diabetic peripheral neuropathy associated with type 2 diabetes mellitus ([place])   . Dyspepsia   . Essential hypertension, benign   . GERD (gastroesophageal reflux disease)   . Goiter   . Hypercholesteremia   . Menopause   . Obesity   . OSA (obstructive sleep apnea) 09/23/2016  . Osteopenia   . PONV (postoperative nausea and vomiting)   . Tachycardia   . Type II or unspecified type diabetes mellitus without mention of complication, not stated as uncontrolled      Medications reviewed.   Current Outpatient Medications:  .  Ascorbic Acid (VITAMIN C) 500 MG CAPS, Take 500 mg by mouth daily., Disp: , Rfl:  .  aspirin 81 MG chewable tablet, Chew 81 mg by mouth daily., Disp: , Rfl:  .  atenolol (TENORMIN) 100 MG tablet, Take 100 mg by mouth daily., Disp: , Rfl:  .  atorvastatin (LIPITOR) 40 MG tablet, TAKE 1 TABLET BY MOUTH ONCE DAILY, Disp: 30 tablet, Rfl: 11 .  B-D UF III MINI PEN NEEDLES 31G X 5 MM MISC, , Disp: , Rfl:  .  cholecalciferol (VITAMIN D) 1000 units tablet, Take 1,000 Units by mouth 3 (three) times daily.  (Patient not taking: Reported on 01/04/2021), Disp: , Rfl:  .  Cholecalciferol (VITAMIN D3 PO), Take 300 Units by mouth daily., Disp: , Rfl:  .  ezetimibe (ZETIA) 10 MG tablet, Take 10 mg by mouth daily., Disp: , Rfl:  .  febuxostat (ULORIC) 40 MG tablet, Take 40 mg by mouth daily. , Disp: , Rfl:  .  glucose blood (ONETOUCH VERIO) test strip, Check blood sugar 3 x daily, Disp: 100 each, Rfl: 6 .  insulin lispro (HUMALOG) 100  UNIT/ML injection, Inject into the skin daily. Before meals for CBG greater then 250, Disp: , Rfl:  .  Insulin NPH Human, [place], (NOVOLIN N [place]), Inject 20 Units into the skin daily., Disp: , Rfl:  .  ketoconazole (NIZORAL) 2 % cream, Apply 1 application topically daily., Disp: 15 g, Rfl: 11 .  Lancets (ONETOUCH ULTRASOFT) lancets, Use as instructed, Disp: 100 each, Rfl: 12 .  loratadine (CLARITIN) 10 MG tablet, Take 10 mg by mouth as needed. , Disp: , Rfl:  .  Multiple Vitamin (MULTIVITAMIN) capsule, Take 1 capsule by mouth daily., Disp: , Rfl:  .  nitroGLYCERIN (NITROSTAT) 0.4 MG SL tablet, Place 0.4 mg under the tongue every 5 (five) minutes as needed for chest pain., Disp: , Rfl:  .  promethazine (PHENERGAN) 25 MG tablet, Take 25 mg by mouth daily as needed for nausea., Disp: , Rfl:  .  sodium bicarbonate 650 MG tablet, Take 650 mg by mouth 4 (four) times daily., Disp: , Rfl:    Ht Readings from Last 1 Encounters:  01/04/21 5\' 4"  (1.626 m)     Wt Readings from Last 3 Encounters:  01/04/21 221 lb 9 oz (100.5 kg)  10/31/20 214 lb 9.6 oz (97.3 kg)  10/30/20 207 lb (93.9 kg)  There is no height or weight on file to calculate BMI.   Social History   Tobacco Use  Smoking Status Never Smoker  Smokeless Tobacco Never Used     Lab Results  Component Value Date   CHOL 232 (H) 03/07/2020   Lab Results  Component Value Date   HDL 36 (L) 03/07/2020   Lab Results  Component Value Date   [facility]  03/07/2020     Comment:     . LDL cholesterol not calculated. Triglyceride levels greater than 400 mg/dL invalidate calculated LDL results. . Reference range: <100 . Desirable range <100 mg/dL for primary prevention;   <70 mg/dL for patients with CHD or diabetic patients  with > or = 2 CHD risk factors. Marland Kitchen LDL-C is now calculated using the Martin-Hopkins  calculation, which is a validated novel method providing  better accuracy than the Friedewald equation in the  estimation  of LDL-C.  Cresenciano Genre et al. Annamaria Helling. 8250;539(76): 2061-2068  (http://education.[employer].com/faq/FAQ164)    Lab Results  Component Value Date   TRIG 646 (H) 03/07/2020     Lab Results  Component Value Date   HGBA1C 8.3 (H) 03/07/2020     CBG (last 3)  Recent Labs    01/10/21 0839 01/10/21 0935  GLUCAP 148* 113*     Nutrition Note  Spoke with pt. Nutrition Plan and Nutrition Survey goals reviewed with pt. Pt is following a Heart Healthy diet. Pt is working with a Physiological scientist 2 times per week and focusing on portion sizes in effort to lose weight. She has lost 6 lbs in the past 2 weeks.  Pt has Type 2 Diabetes. Last A1c indicates blood glucose well-controlled. Pt checks CBG's 6 times a day. Fasting CBG's reportedly 100-130 mg/dL. She has had DSMES twice. She has consistent follow up with endo and feels comfident in her diabetes management skills. She knows her medications. Pt with CKD stage 4. She is not on transplant list currently. She tries to restrict potassium and protein.  Per discussion, pt does not use canned/convenience foods often. Pt does not add salt to food. Pt does not eat out frequently.   Pt expressed understanding of the information reviewed.    Nutrition Diagnosis ? Obese  II = 35-39.9 related to excessive energy intake as evidenced by a 37.93kg/m2  Nutrition Intervention ? Pt's individual nutrition plan reviewed with pt. ? Benefits of adopting Heart Healthy diet discussed when Picture your plate reviewed.   ? Continue client-centered nutrition education by RD, as part of interdisciplinary care.  Goal(s) ? Pt to identify food quantities necessary to achieve weight loss of 6-24 lb at graduation from cardiac rehab.  ? Pt to build a healthy plate including vegetables, fruits, whole grains, and low-fat dairy products in a heart healthy meal plan.  Plan:   Will provide client-centered nutrition education as part of interdisciplinary  care  Monitor and evaluate progress toward nutrition goal with team.   Michaele Offer, MS, RDN, LDN

## 2021-01-15 ENCOUNTER — Encounter (HOSPITAL_COMMUNITY): Payer: Medicare PPO

## 2021-01-17 ENCOUNTER — Other Ambulatory Visit: Payer: Self-pay

## 2021-01-17 ENCOUNTER — Encounter (HOSPITAL_COMMUNITY)
Admission: RE | Admit: 2021-01-17 | Discharge: 2021-01-17 | Disposition: A | Discharge status: Home / Self Care | Payer: Medicare PPO | Source: Ambulatory Visit | Attending: Cardiology | Admitting: Cardiology

## 2021-01-17 DIAGNOSIS — I208 Other forms of angina pectoris: Secondary | ICD-10-CM | POA: Diagnosis not present

## 2021-01-17 DIAGNOSIS — Z79899 Other long term (current) drug therapy: Secondary | ICD-10-CM | POA: Diagnosis not present

## 2021-01-17 DIAGNOSIS — E119 Type 2 diabetes mellitus without complications: Secondary | ICD-10-CM | POA: Diagnosis not present

## 2021-01-17 DIAGNOSIS — I10 Essential (primary) hypertension: Secondary | ICD-10-CM | POA: Diagnosis not present

## 2021-01-17 DIAGNOSIS — Z794 Long term (current) use of insulin: Secondary | ICD-10-CM | POA: Diagnosis not present

## 2021-01-17 DIAGNOSIS — Z7982 Long term (current) use of aspirin: Secondary | ICD-10-CM | POA: Diagnosis not present

## 2021-01-19 ENCOUNTER — Encounter (HOSPITAL_COMMUNITY)
Admission: RE | Admit: 2021-01-19 | Discharge: 2021-01-19 | Disposition: A | Discharge status: Home / Self Care | Payer: Medicare PPO | Source: Ambulatory Visit | Attending: Cardiology | Admitting: Cardiology

## 2021-01-19 ENCOUNTER — Other Ambulatory Visit: Payer: Self-pay

## 2021-01-19 DIAGNOSIS — Z794 Long term (current) use of insulin: Secondary | ICD-10-CM | POA: Diagnosis not present

## 2021-01-19 DIAGNOSIS — I10 Essential (primary) hypertension: Secondary | ICD-10-CM | POA: Diagnosis not present

## 2021-01-19 DIAGNOSIS — I208 Other forms of angina pectoris: Secondary | ICD-10-CM

## 2021-01-19 DIAGNOSIS — Z79899 Other long term (current) drug therapy: Secondary | ICD-10-CM | POA: Diagnosis not present

## 2021-01-19 DIAGNOSIS — Z7982 Long term (current) use of aspirin: Secondary | ICD-10-CM | POA: Diagnosis not present

## 2021-01-19 DIAGNOSIS — E119 Type 2 diabetes mellitus without complications: Secondary | ICD-10-CM | POA: Diagnosis not present

## 2021-01-22 ENCOUNTER — Telehealth: Payer: Self-pay | Admitting: Internal Medicine

## 2021-01-22 ENCOUNTER — Encounter: Payer: Self-pay | Admitting: Internal Medicine

## 2021-01-22 ENCOUNTER — Telehealth (HOSPITAL_COMMUNITY): Payer: Self-pay | Admitting: Internal Medicine

## 2021-01-22 ENCOUNTER — Encounter (HOSPITAL_COMMUNITY): Payer: Medicare PPO

## 2021-01-22 ENCOUNTER — Telehealth (INDEPENDENT_AMBULATORY_CARE_PROVIDER_SITE_OTHER): Payer: Medicare PPO | Admitting: Internal Medicine

## 2021-01-22 VITALS — BP 111/71 | HR 91 | Temp 98.1°F

## 2021-01-22 DIAGNOSIS — J0100 Acute maxillary sinusitis, unspecified: Secondary | ICD-10-CM

## 2021-01-22 MED ORDER — AZITHROMYCIN 250 MG PO TABS: ORAL_TABLET | ORAL | 0 refills | Status: DC

## 2021-01-22 NOTE — Telephone Encounter (Signed)
Called patient back to schedule video visit, husband has been out to several drug stores looking for home COVID test has not been able to find any, they have ordered the ones from the government but they want be in till end of the month.

## 2021-01-22 NOTE — Telephone Encounter (Signed)
Can she do a home Covid test? We can do virtual visit.

## 2021-01-22 NOTE — Telephone Encounter (Signed)
[name] [phone]  Nekeya called to say Saturday evening she started having sinus symptoms , like her teeth are sore, face is tender and a runny nose. She has had her COVID vaccines and booster in October, no exposure that she knows of and no fever.

## 2021-01-22 NOTE — Telephone Encounter (Signed)
She is going to try and get COVID home test and call us back

## 2021-01-22 NOTE — Patient Instructions (Addendum)
[place]  Call me with your test results.  Take Zithromax Z-PAK two p.o. day one followed by one p.o. days two through five.  Quarantine at home until [place] test is back.  Please let cardiac rehab know you will not be coming for at least 2 days until test results are back.  Rest and drink plenty of fluids.

## 2021-01-22 NOTE — Progress Notes (Signed)
   Subjective:    Patient ID: [name], female    DOB: [birth year], 70 y.o.   MRN: [id number]  HPI Patient is currently going to cardiac rehab.  She has had 3 COVID-19 immunizations.  Also had flu vaccine in September 2021.  She has a history of angina, sleep apnea, diabetes mellitus, gout,and hypertension. Endocrinologist is Dr. Chalmers Cater.  Patient says Accu-Cheks have been stable.  On Saturday evening January 22 she developed left ear discomfort and maxillary sinus discomfort.  Says teeth are sore and face is tender.  Has rhinorrhea.  Has had 3 COVID-19 immunizations and no known exposure.  She tried to get home Covid test but could not find one at pharmacy.  Suggested Covid testing at testing center near [facility].  No travel history.  Denies fever, sore throat, nausea, diarrhea.  No conjunctivitis symptoms.  No dysphagia.  History of stage IV chronic kidney disease followed at [facility].  Due to Coronavirus pandemic, she is seen today via interactive audio and video telecommunications.  She is identified using 2 identifiers is [name], a patient in this practice.  She is agreeable to visit in this format today.  She is in her home and I am at my office.  Review of Systems See above    Objective:   Physical Exam Reports temperature to be 98.1 degrees pulse 91 regular and blood pressure 111/71  Seen in her home in no severe distress.  Looks a bit fatigued.  Not hoarse.  Not heard to be coughing.       Assessment & Plan:  Acute maxillary sinusitis  Left otitis media  History of diabetes mellitus  No known COVID-19 exposure and has been vaccinated x3  Plan: She will have a Covid test tomorrow at [facility] location.  For sinusitis symptoms, will be started on Zithromax 2 tablets day 1 followed by 1 tablet days 2 through 5.  Quarantine until test results are back.  Rest and drink fluids.  Monitor Accu-Cheks.

## 2021-01-23 NOTE — Telephone Encounter (Signed)
Patient called back to say she went to [place] to get tested for COVID-19 and was told that it could be 5-7 days before results are back.

## 2021-01-24 ENCOUNTER — Telehealth (HOSPITAL_COMMUNITY): Payer: Self-pay | Admitting: Internal Medicine

## 2021-01-24 ENCOUNTER — Encounter (HOSPITAL_COMMUNITY): Payer: Medicare PPO

## 2021-01-25 NOTE — Telephone Encounter (Signed)
01/22/21 got tested for covid and today she came back positive. Has been under quarantine since 01/19/21. Only symtoms are sinus symptoms. No fever. She wants to know how much longer she needs to quarantine.

## 2021-01-25 NOTE — Telephone Encounter (Signed)
I called patient and let her know what Dr Renold Genta said she verbalized understanding. She stated that she was going to wait until week after next to go back and she is also going to drop off positive COVID results so we will have them.

## 2021-01-25 NOTE — Telephone Encounter (Signed)
I would not rush going back to Cardiac rehab. It can certainly wait and she does not need to expose others there. Likely can go back first of the week but would not push this with her medical conditions either.

## 2021-01-26 ENCOUNTER — Encounter (HOSPITAL_COMMUNITY): Payer: Medicare PPO

## 2021-01-26 ENCOUNTER — Telehealth (HOSPITAL_COMMUNITY): Payer: Self-pay | Admitting: Internal Medicine

## 2021-01-29 ENCOUNTER — Encounter (HOSPITAL_COMMUNITY): Payer: Medicare PPO

## 2021-01-30 DIAGNOSIS — G4733 Obstructive sleep apnea (adult) (pediatric): Secondary | ICD-10-CM | POA: Diagnosis not present

## 2021-01-31 ENCOUNTER — Encounter (HOSPITAL_COMMUNITY): Payer: Medicare PPO

## 2021-02-02 ENCOUNTER — Encounter (HOSPITAL_COMMUNITY): Payer: Medicare PPO

## 2021-02-02 ENCOUNTER — Encounter (HOSPITAL_COMMUNITY): Payer: Self-pay | Admitting: *Deleted

## 2021-02-02 DIAGNOSIS — E669 Obesity, unspecified: Secondary | ICD-10-CM | POA: Diagnosis not present

## 2021-02-02 DIAGNOSIS — M25561 Pain in right knee: Secondary | ICD-10-CM | POA: Diagnosis not present

## 2021-02-02 DIAGNOSIS — D0359 Melanoma in situ of other part of trunk: Secondary | ICD-10-CM | POA: Diagnosis not present

## 2021-02-02 DIAGNOSIS — M1A09X0 Idiopathic chronic gout, multiple sites, without tophus (tophi): Secondary | ICD-10-CM | POA: Diagnosis not present

## 2021-02-02 DIAGNOSIS — M65332 Trigger finger, left middle finger: Secondary | ICD-10-CM | POA: Diagnosis not present

## 2021-02-02 DIAGNOSIS — N184 Chronic kidney disease, stage 4 (severe): Secondary | ICD-10-CM | POA: Diagnosis not present

## 2021-02-02 DIAGNOSIS — I208 Other forms of angina pectoris: Secondary | ICD-10-CM

## 2021-02-02 DIAGNOSIS — M65341 Trigger finger, right ring finger: Secondary | ICD-10-CM | POA: Diagnosis not present

## 2021-02-02 DIAGNOSIS — Z6837 Body mass index (BMI) 37.0-37.9, adult: Secondary | ICD-10-CM | POA: Diagnosis not present

## 2021-02-02 NOTE — Progress Notes (Signed)
Cardiac Individual Treatment Plan  Patient Details  Name: [name] MRN: [id number] Date of Birth: 1951/04/05 Referring Provider:   Flowsheet Row CARDIAC REHAB PHASE II ORIENTATION from 01/04/2021 in [place]  Referring Provider Dr Lolita Patella MD, Dr Fransico Him MD, Covering      Initial Encounter Date:  [place] from 01/04/2021 in [place]  Date 01/04/21      Visit Diagnosis: Stable angina [facility])  Patient's Home Medications on Admission:  Current Outpatient Medications:  .  Ascorbic Acid (VITAMIN C) 500 MG CAPS, Take 500 mg by mouth daily., Disp: , Rfl:  .  aspirin 81 MG chewable tablet, Chew 81 mg by mouth daily., Disp: , Rfl:  .  atenolol (TENORMIN) 100 MG tablet, Take 100 mg by mouth daily., Disp: , Rfl:  .  atorvastatin (LIPITOR) 40 MG tablet, TAKE 1 TABLET BY MOUTH ONCE DAILY, Disp: 30 tablet, Rfl: 11 .  azithromycin (ZITHROMAX) 250 MG tablet, 2 tabs by mouth day 1 followed by one tab by mouth days 2-5, Disp: 6 tablet, Rfl: 0 .  B-D UF III MINI PEN NEEDLES 31G X 5 MM MISC, , Disp: , Rfl:  .  cholecalciferol (VITAMIN D) 1000 units tablet, Take 1,000 Units by mouth 3 (three) times daily., Disp: , Rfl:  .  ezetimibe (ZETIA) 10 MG tablet, Take 10 mg by mouth daily., Disp: , Rfl:  .  febuxostat (ULORIC) 40 MG tablet, Take 40 mg by mouth daily. , Disp: , Rfl:  .  glucose blood (ONETOUCH VERIO) test strip, Check blood sugar 3 x daily, Disp: 100 each, Rfl: 6 .  insulin lispro (HUMALOG) 100 UNIT/ML injection, Inject into the skin daily. Before meals for CBG greater then 250, Disp: , Rfl:  .  Insulin NPH Human, Isophane, (NOVOLIN N [place]), Inject 20 Units into the skin daily., Disp: , Rfl:  .  ketoconazole (NIZORAL) 2 % cream, Apply 1 application topically daily., Disp: 15 g, Rfl: 11 .  Lancets (ONETOUCH ULTRASOFT) lancets, Use as instructed, Disp: 100 each, Rfl: 12 .   loratadine (CLARITIN) 10 MG tablet, Take 10 mg by mouth as needed. , Disp: , Rfl:  .  Multiple Vitamin (MULTIVITAMIN) capsule, Take 1 capsule by mouth daily., Disp: , Rfl:  .  nitroGLYCERIN (NITROSTAT) 0.4 MG SL tablet, Place 0.4 mg under the tongue every 5 (five) minutes as needed for chest pain., Disp: , Rfl:  .  promethazine (PHENERGAN) 25 MG tablet, Take 25 mg by mouth daily as needed for nausea., Disp: , Rfl:  .  sodium bicarbonate 650 MG tablet, Take 650 mg by mouth 4 (four) times daily., Disp: , Rfl:   Past Medical History: Past Medical History:  Diagnosis Date  . Allergic rhinitis   . Carbuncle, trunk   . Carpal tunnel syndrome   . Chronic kidney disease   . Combined hyperlipidemia associated with type 2 diabetes mellitus ([place])   . Complication of anesthesia   . Cyclical vomiting   . Dependent edema   . Diabetes mellitus type II   . Diabetic autonomic neuropathy ([place])   . Diabetic peripheral neuropathy associated with type 2 diabetes mellitus ([place])   . Dyspepsia   . Essential hypertension, benign   . GERD (gastroesophageal reflux disease)   . Goiter   . Hypercholesteremia   . Menopause   . Obesity   . OSA (obstructive sleep apnea) 09/23/2016  . Osteopenia   .  PONV (postoperative nausea and vomiting)   . Tachycardia   . Type II or unspecified type diabetes mellitus without mention of complication, not stated as uncontrolled     Tobacco Use: Social History   Tobacco Use  Smoking Status Never Smoker  Smokeless Tobacco Never Used    Labs: Recent Review Flowsheet Data    Labs for ITP Cardiac and Pulmonary Rehab Latest Ref Rng & Units 11/18/2014 02/23/2015 02/27/2018 03/04/2019 03/07/2020   Cholestrol <200 mg/dL 182 216(H) 194 206(H) 232(H)   LDLCALC mg/dL (calc) 88 NOT CALC 105(H) - -   HDL > OR = 50 mg/dL 38(L) 35(L) 32(L) 34(L) 36(L)   Trlycerides <150 mg/dL 282(H) 407(H) 398(H) 418(H) 646(H)   Hemoglobin A1c <5.7 % of total Hgb - 6.2(H) 7.2(H) 7.7(H) 8.3(H)       Capillary Blood Glucose: Lab Results  Component Value Date   GLUCAP 113 (H) 01/10/2021   GLUCAP 148 (H) 01/10/2021   GLUCAP 97 01/08/2021   GLUCAP 126 (H) 01/08/2021   GLUCAP 161 (H) 01/04/2021     Exercise Target Goals: Exercise Program Goal: Individual exercise prescription set using results from initial 6 min walk test and THRR while considering  patient's activity barriers and safety.   Exercise Prescription Goal: Starting with aerobic activity 30 plus minutes a day, 3 days per week for initial exercise prescription. Provide home exercise prescription and guidelines that participant acknowledges understanding prior to discharge.  Activity Barriers & Risk Stratification:  Activity Barriers & Cardiac Risk Stratification - 01/04/21 1200      Activity Barriers & Cardiac Risk Stratification   Activity Barriers Deconditioning;Shortness of Breath;Balance Concerns;Chest Pain/Angina    Cardiac Risk Stratification High           6 Minute Walk:  6 Minute Walk    Row Name 01/04/21 0948         6 Minute Walk   Phase Initial     Distance 1540 feet     Walk Time 6 minutes     # of Rest Breaks 0     MPH 2.92     METS 3.26     RPE 12     Perceived Dyspnea  2     VO2 Peak 11.4     Symptoms Yes (comment)     Comments SOB, RPD=2     Resting HR 71 bpm     Resting BP 148/80     Resting Oxygen Saturation  96 %     Exercise Oxygen Saturation  during 6 min walk 92 %     Max Ex. HR 109 bpm     Max Ex. BP 168/80     2 Minute Post BP 158/80            Oxygen Initial Assessment:   Oxygen Re-Evaluation:   Oxygen Discharge (Final Oxygen Re-Evaluation):   Initial Exercise Prescription:  Initial Exercise Prescription - 01/04/21 1200      Date of Initial Exercise RX and Referring Provider   Date 01/04/21    Referring Provider Dr Lolita Patella MD, Dr Fransico Him MD, Covering    Expected Discharge Date 03/02/21      Bike   Level 1    Minutes 15    METs 2       NuStep   Level 2    SPM 85    Minutes 15    METs 1.7      Prescription Details   Frequency (times per week) 3  Duration Progress to 30 minutes of continuous aerobic without signs/symptoms of physical distress      Intensity   THRR 40-80% of Max Heartrate 61-122    Ratings of Perceived Exertion 11-13    Perceived Dyspnea 0-4      Progression   Progression Continue progressive overload as per policy without signs/symptoms or physical distress.      Resistance Training   Training Prescription Yes    Weight 3    Reps 10-15           Perform Capillary Blood Glucose checks as needed.  Exercise Prescription Changes:   Exercise Prescription Changes    Row Name 01/08/21 1000 01/19/21 1139 02/05/21 1300         Response to Exercise   Blood Pressure (Admit) 122/62 122/80 140/78     Blood Pressure (Exercise) 160/86 118/72 124/78     Blood Pressure (Exit) 108/70 130/80 130/70     Heart Rate (Admit) 72 bpm 89 bpm 91 bpm     Heart Rate (Exercise) 94 bpm 101 bpm 100 bpm     Heart Rate (Exit) 77 bpm 71 bpm 82 bpm     Rating of Perceived Exertion (Exercise) 11 12 12      Symptoms None None None     Comments Pt's first day of exercise in the CRP2 program Reviewed METs Reviewed METs and Goals     Duration Progress to 30 minutes of  aerobic without signs/symptoms of physical distress Continue with 30 min of aerobic exercise without signs/symptoms of physical distress. Continue with 30 min of aerobic exercise without signs/symptoms of physical distress.     Intensity THRR unchanged THRR unchanged THRR unchanged           Progression   Progression Continue to progress workloads to maintain intensity without signs/symptoms of physical distress. Continue to progress workloads to maintain intensity without signs/symptoms of physical distress. Continue to progress workloads to maintain intensity without signs/symptoms of physical distress.     Average METs 2 2.1 2.65            Resistance Training   Training Prescription Yes Yes Yes     Weight 3 3 3      Reps 10-15 10-15 10-15     Time 10 Minutes 10 Minutes 10 Minutes           Interval Training   Interval Training No No No           Bike   Level 1 2 2      Minutes 15 15 15      METs 2.1 2 3.2           NuStep   Level 2 2 2      SPM 85 845 85     Minutes 15 15 15      METs 1.9 2.2 2.1            Exercise Comments:   Exercise Comments    Row Name 01/08/21 1217 01/19/21 1200 01/24/21 1555 02/05/21 1032     Exercise Comments Pt's first day of exercise in the CRP2 program. Pt tolerated session well. Reviewed METs with patient. Pt is making good progress in the program and demostrates good attendance. Pt reports being COVID postive and will be unable to exercise until cleared. Reviewed METs and goals. Pt has returned to the [facility] program after COVID-19 illness. Pt tolerated the session well and we will continue to progress as tolerated.  Exercise Goals and Review:   Exercise Goals    Row Name 01/04/21 1217             Exercise Goals   Increase Physical Activity Yes       Intervention Provide advice, education, support and counseling about physical activity/exercise needs.;Develop an individualized exercise prescription for aerobic and resistive training based on initial evaluation findings, risk stratification, comorbidities and participant's personal goals.       Expected Outcomes Short Term: Attend rehab on a regular basis to increase amount of physical activity.;Long Term: Add in home exercise to make exercise part of routine and to increase amount of physical activity.;Long Term: Exercising regularly at least 3-5 days a week.       Increase Strength and Stamina Yes       Intervention Provide advice, education, support and counseling about physical activity/exercise needs.;Develop an individualized exercise prescription for aerobic and resistive training based on initial evaluation  findings, risk stratification, comorbidities and participant's personal goals.       Expected Outcomes Short Term: Increase workloads from initial exercise prescription for resistance, speed, and METs.;Short Term: Perform resistance training exercises routinely during rehab and add in resistance training at home;Long Term: Improve cardiorespiratory fitness, muscular endurance and strength as measured by increased METs and functional capacity (6MWT)       Able to understand and use rate of perceived exertion (RPE) scale Yes       Intervention Provide education and explanation on how to use RPE scale       Expected Outcomes Short Term: Able to use RPE daily in rehab to express subjective intensity level;Long Term:  Able to use RPE to guide intensity level when exercising independently       Able to understand and use Dyspnea scale Yes       Intervention Provide education and explanation on how to use Dyspnea scale       Expected Outcomes Short Term: Able to use Dyspnea scale daily in rehab to express subjective sense of shortness of breath during exertion;Long Term: Able to use Dyspnea scale to guide intensity level when exercising independently       Knowledge and understanding of Target Heart Rate Range (THRR) Yes       Intervention Provide education and explanation of THRR including how the numbers were predicted and where they are located for reference       Expected Outcomes Short Term: Able to state/look up THRR;Short Term: Able to use daily as guideline for intensity in rehab;Long Term: Able to use THRR to govern intensity when exercising independently       Understanding of Exercise Prescription Yes       Intervention Provide education, explanation, and written materials on patient's individual exercise prescription       Expected Outcomes Short Term: Able to explain program exercise prescription;Long Term: Able to explain home exercise prescription to exercise independently               Exercise Goals Re-Evaluation :  Exercise Goals Re-Evaluation    [place] Name 01/08/21 1138 02/05/21 1030           Exercise Goal Re-Evaluation   Exercise Goals Review Increase Physical Activity;Increase Strength and Stamina;Able to understand and use rate of perceived exertion (RPE) scale;Knowledge and understanding of Target Heart Rate Range (THRR);Understanding of Exercise Prescription Increase Strength and Stamina;Able to understand and use rate of perceived exertion (RPE) scale;Knowledge and understanding of Target Heart Rate Range (THRR);Understanding of  Exercise Prescription      Comments Pt's first day of exercise in the CRP2 program. Pt understands the RPE scale, THRR, and Exercise Rx. Reviewed METs and Goals. Pt returning today after being out 2 weeks with COVID-19. Pt feels well and is motivated to resume the program. Pt tolerated today's  session well at her previous workloads. Pt has stationary bike at home and id ready to begin using at home. Will discuss home exercise with patient next session.      Expected Outcomes Will continue to montior patient and progress exercise workloads as tolerated. Will continue to montior patient and progress exercise workloads as tolerated.              Discharge Exercise Prescription (Final Exercise Prescription Changes):  Exercise Prescription Changes - 02/05/21 1300      Response to Exercise   Blood Pressure (Admit) 140/78    Blood Pressure (Exercise) 124/78    Blood Pressure (Exit) 130/70    Heart Rate (Admit) 91 bpm    Heart Rate (Exercise) 100 bpm    Heart Rate (Exit) 82 bpm    Rating of Perceived Exertion (Exercise) 12    Symptoms None    Comments Reviewed METs and Goals    Duration Continue with 30 min of aerobic exercise without signs/symptoms of physical distress.    Intensity THRR unchanged      Progression   Progression Continue to progress workloads to maintain intensity without signs/symptoms of physical distress.     Average METs 2.65      Resistance Training   Training Prescription Yes    Weight 3    Reps 10-15    Time 10 Minutes      Interval Training   Interval Training No      Bike   Level 2    Minutes 15    METs 3.2      NuStep   Level 2    SPM 85    Minutes 15    METs 2.1           Nutrition:  Target Goals: Understanding of nutrition guidelines, daily intake of sodium 1500mg , cholesterol 200mg , calories 30% from fat and 7% or less from saturated fats, daily to have 5 or more servings of fruits and vegetables.  Biometrics:  Pre Biometrics - 01/04/21 1221      Pre Biometrics   Height 5\' 4"  (1.626 m)    Weight 221 lb 9 oz (100.5 kg)    Waist Circumference 51 inches    Hip Circumference 50 inches    Waist to Hip Ratio 1.02 %    BMI (Calculated) 38.01    Triceps Skinfold 42 mm    % Body Fat 52.5 %    Grip Strength 35 kg    Flexibility 10.5 in    Single Leg Stand 3.53 seconds            Nutrition Therapy Plan and Nutrition Goals:  Nutrition Therapy & Goals - 01/12/21 1452      Nutrition Therapy   Diet TLC; protein/potassium/carb modified    Drug/Food Interactions Statins/Certain Fruits      Personal Nutrition Goals   Nutrition Goal Pt to identify food quantities necessary to achieve weight loss of 6-24 lb at graduation from cardiac rehab.    Personal Goal #2 Pt to build a healthy plate including vegetables, fruits, whole grains, and low-fat dairy products in a heart healthy meal plan.      Intervention Plan  Intervention Prescribe, educate and counsel regarding individualized specific dietary modifications aiming towards targeted core components such as weight, hypertension, lipid management, diabetes, heart failure and other comorbidities.;Nutrition handout(s) given to patient.    Expected Outcomes Short Term Goal: Understand basic principles of dietary content, such as calories, fat, sodium, cholesterol and nutrients.           Nutrition  Assessments:  MEDIFICTS Score Key:  ?70 Need to make dietary changes   40-70 Heart Healthy Diet  ? 40 Therapeutic Level Cholesterol Diet  Flowsheet Row CARDIAC REHAB PHASE II EXERCISE from 01/12/2021 in [place]  Picture Your Plate Total Score on Admission 78     Picture Your Plate Scores:  <46 Unhealthy dietary pattern with much room for improvement.  41-50 Dietary pattern unlikely to meet recommendations for good health and room for improvement.  51-60 More healthful dietary pattern, with some room for improvement.   >60 Healthy dietary pattern, although there may be some specific behaviors that could be improved.    Nutrition Goals Re-Evaluation:  Nutrition Goals Re-Evaluation    [place] Name 01/12/21 1453             Goals   Current Weight 221 lb (100.2 kg)       Nutrition Goal Pt to identify food quantities necessary to achieve weight loss of 6-24 lb at graduation from cardiac rehab.               Personal Goal #2 Re-Evaluation   Personal Goal #2 Pt to build a healthy plate including vegetables, fruits, whole grains, and low-fat dairy products in a heart healthy meal plan.              Nutrition Goals Discharge (Final Nutrition Goals Re-Evaluation):  Nutrition Goals Re-Evaluation - 01/12/21 1453      Goals   Current Weight 221 lb (100.2 kg)    Nutrition Goal Pt to identify food quantities necessary to achieve weight loss of 6-24 lb at graduation from cardiac rehab.      Personal Goal #2 Re-Evaluation   Personal Goal #2 Pt to build a healthy plate including vegetables, fruits, whole grains, and low-fat dairy products in a heart healthy meal plan.           Psychosocial: Target Goals: Acknowledge presence or absence of significant depression and/or stress, maximize coping skills, provide positive support system. Participant is able to verbalize types and ability to use techniques and skills needed for reducing stress and  depression.  Initial Review & Psychosocial Screening:  Initial Psych Review & Screening - 01/04/21 1011      Initial Review   Current issues with None Identified      Family Dynamics   Good Support System? Yes    Comments [name] feels supported by her husband and daughter.  Pt daughter lives in [place]      Barriers   Psychosocial barriers to participate in program There are no identifiable barriers or psychosocial needs.;The patient should benefit from training in stress management and relaxation.      Screening Interventions   Interventions Encouraged to exercise           Quality of Life Scores:  Quality of Life - 01/04/21 1224      Quality of Life   Select Quality of Life      Quality of Life Scores   Health/Function Pre 25.23 %    Socioeconomic Pre 24.86 %    Psych/Spiritual Pre  27.21 %    Family Pre 28.8 %    GLOBAL Pre 26.09 %          Scores of 19 and below usually indicate a poorer quality of life in these areas.  A difference of  2-3 points is a clinically meaningful difference.  A difference of 2-3 points in the total score of the Quality of Life Index has been associated with significant improvement in overall quality of life, self-image, physical symptoms, and general health in studies assessing change in quality of life.  PHQ-9: Recent Review Flowsheet Data    Depression screen [facility] 2/9 01/04/2021 03/08/2019 03/03/2018 08/30/2013 07/04/2013   Decreased Interest 0 0 0 0 0   Down, Depressed, Hopeless 0 0 0 0 0   PHQ - 2 Score 0 0 0 0 0   Altered sleeping 0 - - - -   Tired, decreased energy 1 - - - -   Change in appetite 0 - - - -   Feeling bad or failure about yourself  0 - - - -   Trouble concentrating 0 - - - -   Moving slowly or fidgety/restless 0 - - - -   Suicidal thoughts 0 - - - -   PHQ-9 Score 1 - - - -   Difficult doing work/chores Not difficult at all - - - -     Interpretation of Total Score  Total Score Depression Severity:  1-4 = Minimal  depression, 5-9 = Mild depression, 10-14 = Moderate depression, 15-19 = Moderately severe depression, 20-27 = Severe depression   Psychosocial Evaluation and Intervention:   Psychosocial Re-Evaluation:  Psychosocial Re-Evaluation    Row Name 01/08/21 1039 02/02/21 1003 02/05/21 1226         Psychosocial Re-Evaluation   Current issues with None Identified -- None Identified     Comments -- Unable to assess as Mardene Celeste exercise is currently on hold Pat returned to exercise on 02/05/21 and voiced no increased stressors or concerns     Interventions Encouraged to attend Cardiac Rehabilitation for the exercise -- Encouraged to attend Cardiac Rehabilitation for the exercise     Continue Psychosocial Services  No Follow up required -- No Follow up required            Psychosocial Discharge (Final Psychosocial Re-Evaluation):  Psychosocial Re-Evaluation - 02/05/21 1226      Psychosocial Re-Evaluation   Current issues with None Identified    Comments Pat returned to exercise on 02/05/21 and voiced no increased stressors or concerns    Interventions Encouraged to attend Cardiac Rehabilitation for the exercise    Continue Psychosocial Services  No Follow up required           Vocational Rehabilitation: Provide vocational rehab assistance to qualifying candidates.   Vocational Rehab Evaluation & Intervention:  Vocational Rehab - 01/04/21 1418      Initial Vocational Rehab Evaluation & Intervention   Assessment shows need for Vocational Rehabilitation No   Roshunda is retired and does not need vocational rehab at this time          Education: Education Goals: Education classes will be provided on a weekly basis, covering required topics. Participant will state understanding/return demonstration of topics presented.  Learning Barriers/Preferences:  Learning Barriers/Preferences - 01/04/21 1225      Learning Barriers/Preferences   Learning Barriers Sight   Wears Glasses   Learning  Preferences Video;Pictoral;Skilled Demonstration;Computer/Internet           Education  Topics: Hypertension, Hypertension Reduction -Define heart disease and high blood pressure. Discus how high blood pressure affects the body and ways to reduce high blood pressure.   Exercise and Your Heart -Discuss why it is important to exercise, the FITT principles of exercise, normal and abnormal responses to exercise, and how to exercise safely.   Angina -Discuss definition of angina, causes of angina, treatment of angina, and how to decrease risk of having angina.   Cardiac Medications -Review what the following cardiac medications are used for, how they affect the body, and side effects that may occur when taking the medications.  Medications include Aspirin, Beta blockers, calcium channel blockers, ACE Inhibitors, angiotensin receptor blockers, diuretics, digoxin, and antihyperlipidemics.   Congestive Heart Failure -Discuss the definition of CHF, how to live with CHF, the signs and symptoms of CHF, and how keep track of weight and sodium intake.   Heart Disease and Intimacy -Discus the effect sexual activity has on the heart, how changes occur during intimacy as we age, and safety during sexual activity.   Smoking Cessation / COPD -Discuss different methods to quit smoking, the health benefits of quitting smoking, and the definition of COPD.   Nutrition I: Fats -Discuss the types of cholesterol, what cholesterol does to the heart, and how cholesterol levels can be controlled.   Nutrition II: Labels -Discuss the different components of food labels and how to read food label   Heart Parts/Heart Disease and PAD -Discuss the anatomy of the heart, the pathway of blood circulation through the heart, and these are affected by heart disease.   Stress I: Signs and Symptoms -Discuss the causes of stress, how stress may lead to anxiety and depression, and ways to limit  stress.   Stress II: Relaxation -Discuss different types of relaxation techniques to limit stress.   Warning Signs of Stroke / TIA -Discuss definition of a stroke, what the signs and symptoms are of a stroke, and how to identify when someone is having stroke.   Knowledge Questionnaire Score:  Knowledge Questionnaire Score - 01/04/21 1227      Knowledge Questionnaire Score   Pre Score 20/24           Core Components/Risk Factors/Patient Goals at Admission:  Personal Goals and Risk Factors at Admission - 01/04/21 1232      Core Components/Risk Factors/Patient Goals on Admission    Weight Management Yes;Obesity;Weight Loss    Intervention Weight Management: Develop a combined nutrition and exercise program designed to reach desired caloric intake, while maintaining appropriate intake of nutrient and fiber, sodium and fats, and appropriate energy expenditure required for the weight goal.;Weight Management: Provide education and appropriate resources to help participant work on and attain dietary goals.;Weight Management/Obesity: Establish reasonable short term and long term weight goals.;Obesity: Provide education and appropriate resources to help participant work on and attain dietary goals.    Admit Weight 221 lb 9 oz (100.5 kg)    Goal Weight: Long Term 150 lb (68 kg)   Patients Goal   Expected Outcomes Short Term: Continue to assess and modify interventions until short term weight is achieved;Long Term: Adherence to nutrition and physical activity/exercise program aimed toward attainment of established weight goal;Weight Maintenance: Understanding of the daily nutrition guidelines, which includes 25-35% calories from fat, 7% or less cal from saturated fats, less than 200mg  cholesterol, less than 1.5gm of sodium, & 5 or more servings of fruits and vegetables daily;Weight Loss: Understanding of general recommendations for a balanced deficit meal plan,  which promotes 1-2 lb weight loss per  week and includes a negative energy balance of [id number] kcal/d;Understanding recommendations for meals to include 15-35% energy as protein, 25-35% energy from fat, 35-60% energy from carbohydrates, less than 200mg  of dietary cholesterol, 20-35 gm of total fiber daily;Understanding of distribution of calorie intake throughout the day with the consumption of 4-5 meals/snacks    Diabetes Yes    Intervention Provide education about signs/symptoms and action to take for hypo/hyperglycemia.;Provide education about proper nutrition, including hydration, and aerobic/resistive exercise prescription along with prescribed medications to achieve blood glucose in normal ranges: Fasting glucose 65-99 mg/dL    Expected Outcomes Short Term: Participant verbalizes understanding of the signs/symptoms and immediate care of hyper/hypoglycemia, proper foot care and importance of medication, aerobic/resistive exercise and nutrition plan for blood glucose control.;Long Term: Attainment of HbA1C < 7%.    Hypertension Yes    Intervention Provide education on lifestyle modifcations including regular physical activity/exercise, weight management, moderate sodium restriction and increased consumption of fresh fruit, vegetables, and low fat dairy, alcohol moderation, and smoking cessation.;Monitor prescription use compliance.    Expected Outcomes Short Term: Continued assessment and intervention until BP is < 140/3mm HG in hypertensive participants. < 130/59mm HG in hypertensive participants with diabetes, heart failure or chronic kidney disease.;Long Term: Maintenance of blood pressure at goal levels.    Lipids Yes    Intervention Provide education and support for participant on nutrition & aerobic/resistive exercise along with prescribed medications to achieve LDL 70mg , HDL >40mg .    Expected Outcomes Short Term: Participant states understanding of desired cholesterol values and is compliant with medications prescribed. Participant  is following exercise prescription and nutrition guidelines.;Long Term: Cholesterol controlled with medications as prescribed, with individualized exercise RX and with personalized nutrition plan. Value goals: LDL < 70mg , HDL > 40 mg.           Core Components/Risk Factors/Patient Goals Review:   Goals and Risk Factor Review    Row Name 01/08/21 1040 02/02/21 1004 02/05/21 1227         Core Components/Risk Factors/Patient Goals Review   Personal Goals Review Weight Management/Obesity;Lipids;Diabetes;Hypertension Weight Management/Obesity;Lipids;Diabetes;Hypertension Weight Management/Obesity;Lipids;Diabetes;Hypertension     Review Anyssa started exercise on 01/08/21 Did well with exercise. some moderate BP elvations on the nustep. Vitalia's goals are to get healthy Dericka's exercise is currently on hold as she tested positive for COVID 19 Ivanna returned to exercise on 2/722 and did well with exercise. VSS     Expected Outcomes Patirica will continue to participate inphase 2 cardiac rehab for exercise nutrition and lifestyle modification Patirica will continue to participate inphase 2 cardiac rehab for exercise nutrition and lifestyle modification once cleared to return Patirica will continue to participate in phase 2 cardiac rehab for exercise nutrition and lifestyle modifications            Core Components/Risk Factors/Patient Goals at Discharge (Final Review):   Goals and Risk Factor Review - 02/05/21 1227      Core Components/Risk Factors/Patient Goals Review   Personal Goals Review Weight Management/Obesity;Lipids;Diabetes;Hypertension    Review Kyriaki returned to exercise on 2/722 and did well with exercise. VSS    Expected Outcomes Patirica will continue to participate in phase 2 cardiac rehab for exercise nutrition and lifestyle modifications           ITP Comments:  ITP Comments    Row Name 01/04/21 0937 01/08/21 1038 02/02/21 1000 02/05/21 1225     ITP Comments  Dr Fransico Him MD,  Medical Director 30 Day ITP REview. Carlia started exercise on 01/09/20 and did well with exercise 30 Day ITP Review. Roxann is currently out as she tested positive for COVID 19. 30 Day ITP Review. Soma returend to exercise on 02/05/21 and did well with exercise           Comments: See ITP comments.Barnet Pall, RN,BSN 02/06/2021 1:05 PM

## 2021-02-05 ENCOUNTER — Encounter (HOSPITAL_COMMUNITY)
Admission: RE | Admit: 2021-02-05 | Discharge: 2021-02-05 | Disposition: A | Discharge status: Home / Self Care | Payer: Medicare PPO | Source: Ambulatory Visit | Attending: Cardiology | Admitting: Cardiology

## 2021-02-05 ENCOUNTER — Other Ambulatory Visit: Payer: Self-pay

## 2021-02-05 DIAGNOSIS — I208 Other forms of angina pectoris: Secondary | ICD-10-CM | POA: Insufficient documentation

## 2021-02-06 ENCOUNTER — Ambulatory Visit
Admission: RE | Admit: 2021-02-06 | Discharge: 2021-02-06 | Disposition: A | Discharge status: Home / Self Care | Payer: Medicare PPO | Source: Ambulatory Visit | Attending: Internal Medicine | Admitting: Internal Medicine

## 2021-02-06 DIAGNOSIS — R9439 Abnormal result of other cardiovascular function study: Secondary | ICD-10-CM | POA: Diagnosis not present

## 2021-02-06 DIAGNOSIS — E785 Hyperlipidemia, unspecified: Secondary | ICD-10-CM | POA: Diagnosis not present

## 2021-02-06 DIAGNOSIS — R0600 Dyspnea, unspecified: Secondary | ICD-10-CM | POA: Diagnosis not present

## 2021-02-06 DIAGNOSIS — N184 Chronic kidney disease, stage 4 (severe): Secondary | ICD-10-CM | POA: Diagnosis not present

## 2021-02-06 DIAGNOSIS — Z8616 Personal history of COVID-19: Secondary | ICD-10-CM | POA: Diagnosis not present

## 2021-02-06 DIAGNOSIS — Z79899 Other long term (current) drug therapy: Secondary | ICD-10-CM | POA: Diagnosis not present

## 2021-02-06 DIAGNOSIS — I10 Essential (primary) hypertension: Secondary | ICD-10-CM | POA: Diagnosis not present

## 2021-02-06 DIAGNOSIS — Z1231 Encounter for screening mammogram for malignant neoplasm of breast: Secondary | ICD-10-CM

## 2021-02-06 DIAGNOSIS — E119 Type 2 diabetes mellitus without complications: Secondary | ICD-10-CM | POA: Diagnosis not present

## 2021-02-06 DIAGNOSIS — E875 Hyperkalemia: Secondary | ICD-10-CM | POA: Diagnosis not present

## 2021-02-06 DIAGNOSIS — I129 Hypertensive chronic kidney disease with stage 1 through stage 4 chronic kidney disease, or unspecified chronic kidney disease: Secondary | ICD-10-CM | POA: Diagnosis not present

## 2021-02-06 DIAGNOSIS — N2581 Secondary hyperparathyroidism of renal origin: Secondary | ICD-10-CM | POA: Diagnosis not present

## 2021-02-06 DIAGNOSIS — E1122 Type 2 diabetes mellitus with diabetic chronic kidney disease: Secondary | ICD-10-CM | POA: Diagnosis not present

## 2021-02-06 DIAGNOSIS — N2589 Other disorders resulting from impaired renal tubular function: Secondary | ICD-10-CM | POA: Diagnosis not present

## 2021-02-06 DIAGNOSIS — Z794 Long term (current) use of insulin: Secondary | ICD-10-CM | POA: Diagnosis not present

## 2021-02-06 IMAGING — MG MM DIGITAL SCREENING BILAT W/ TOMO AND CAD
6 of 10 series · 6 of 30 positions shown · non-contrast
Comparison: Previous exam(s).

ACR Breast Density Category a: The breast tissue is almost entirely
fatty.

CLINICAL DATA: Screening.

EXAM:
DIGITAL SCREENING BILATERAL MAMMOGRAM WITH TOMOSYNTHESIS AND CAD
TECHNIQUE: Bilateral screening digital craniocaudal and mediolateral oblique
mammograms were obtained. Bilateral screening digital breast
tomosynthesis was performed. The images were evaluated with
computer-aided detection.

[R CC synth-2D]
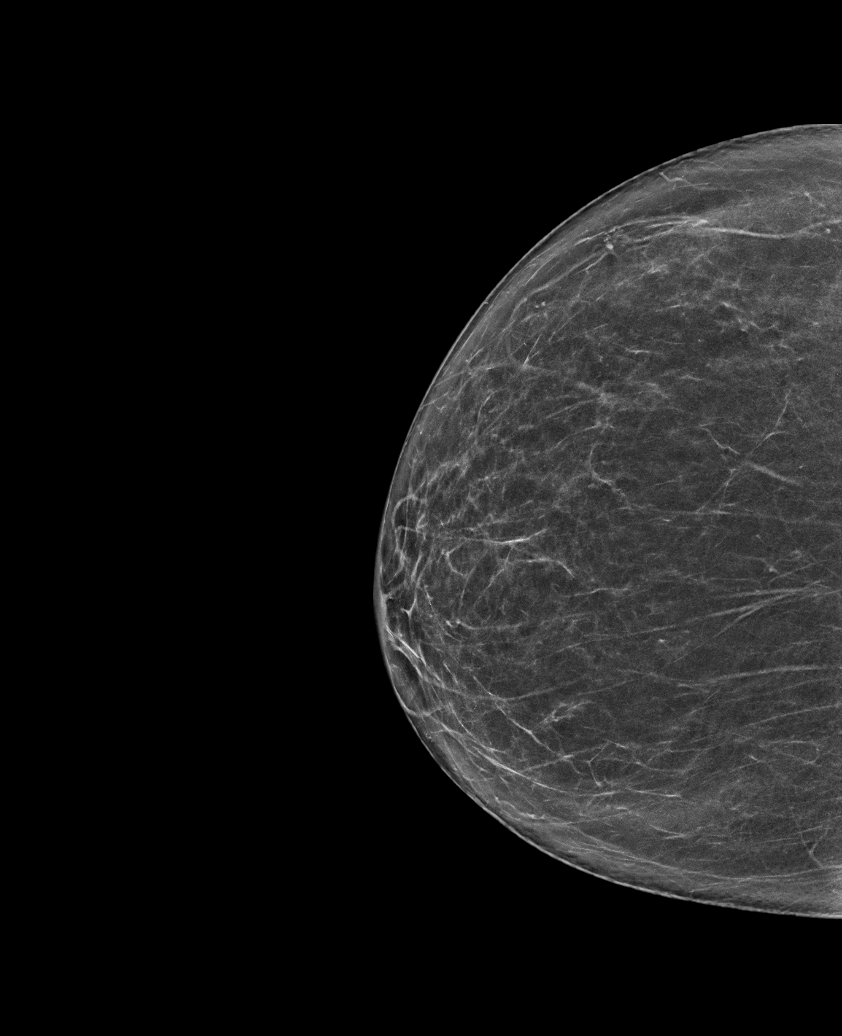

[L MLO synth-2D]
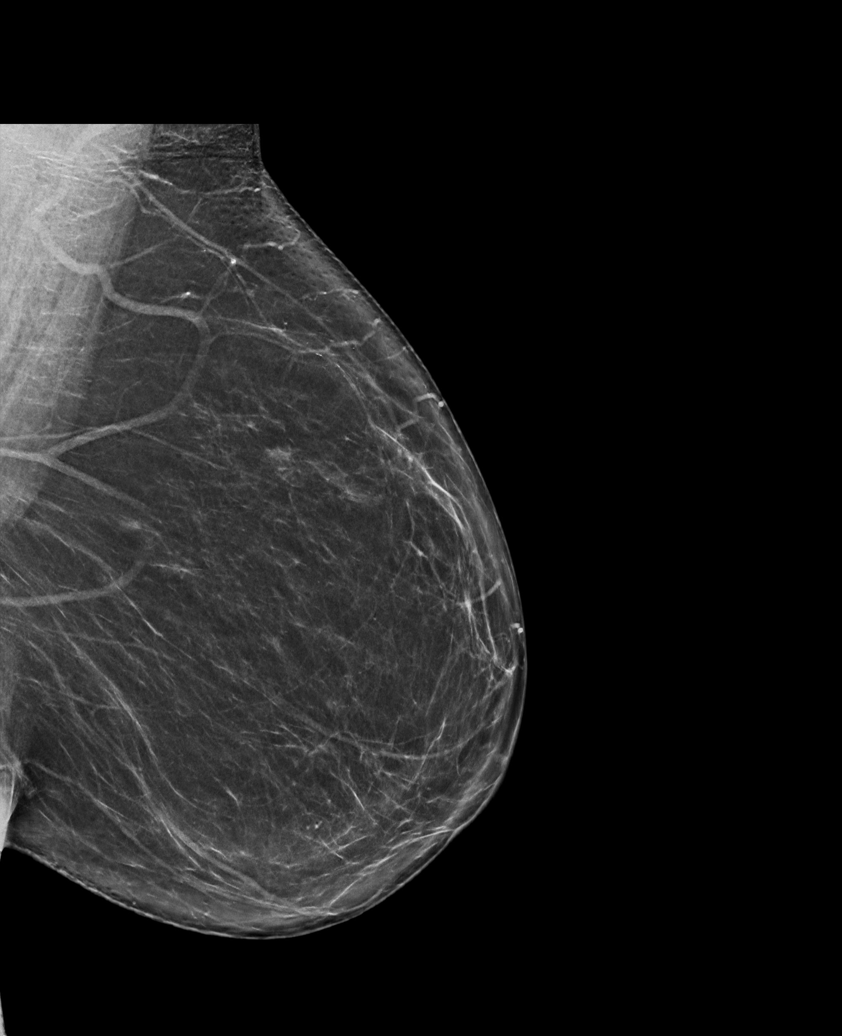

[R MLO synth-2D (1 of 2)]
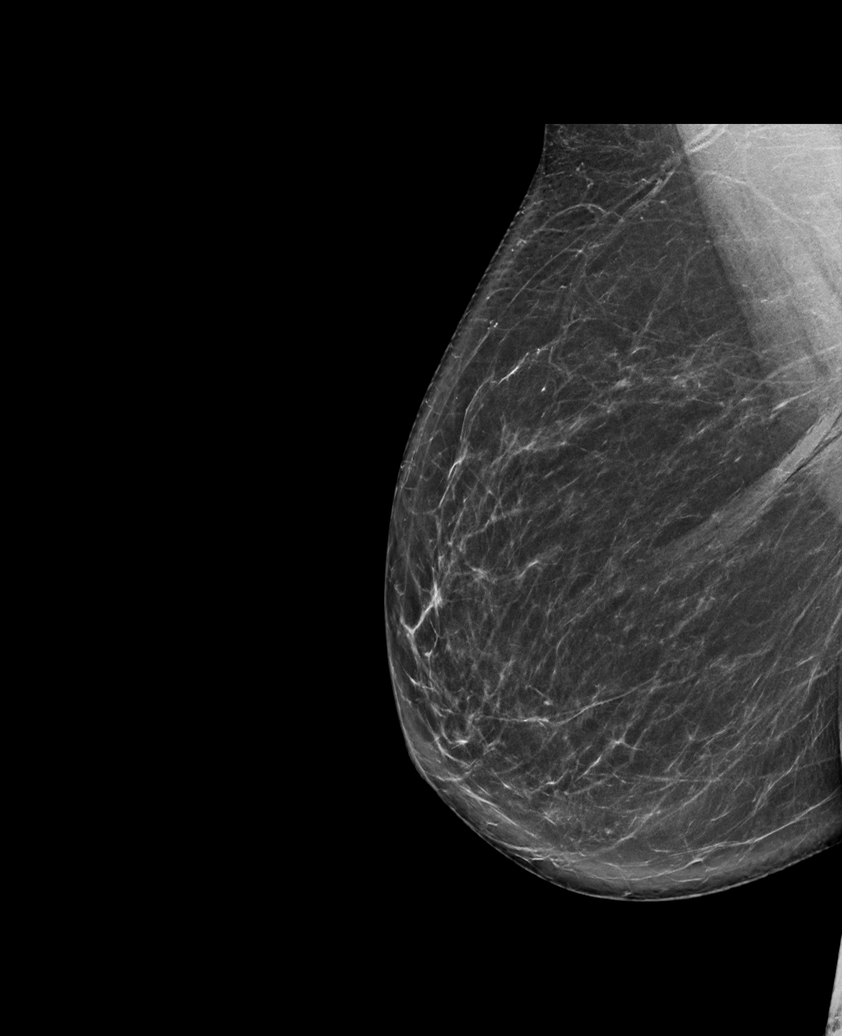

[L CC synth-2D]
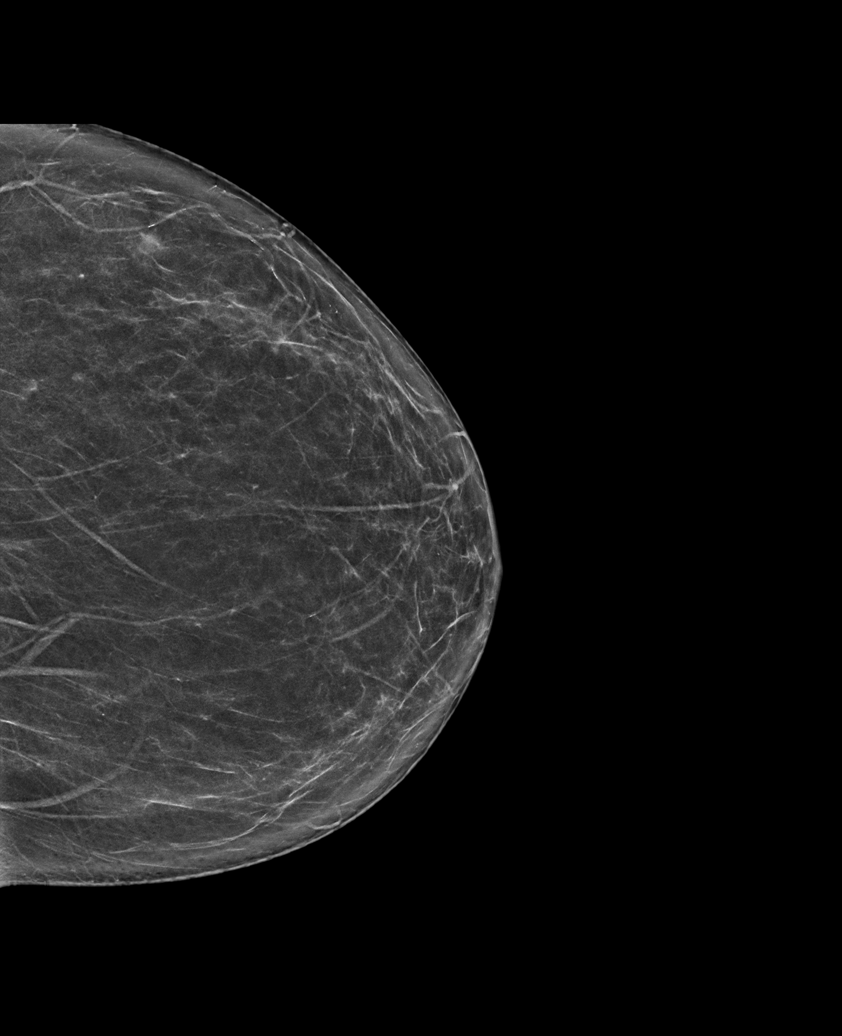

[R MLO synth-2D (2 of 2)]
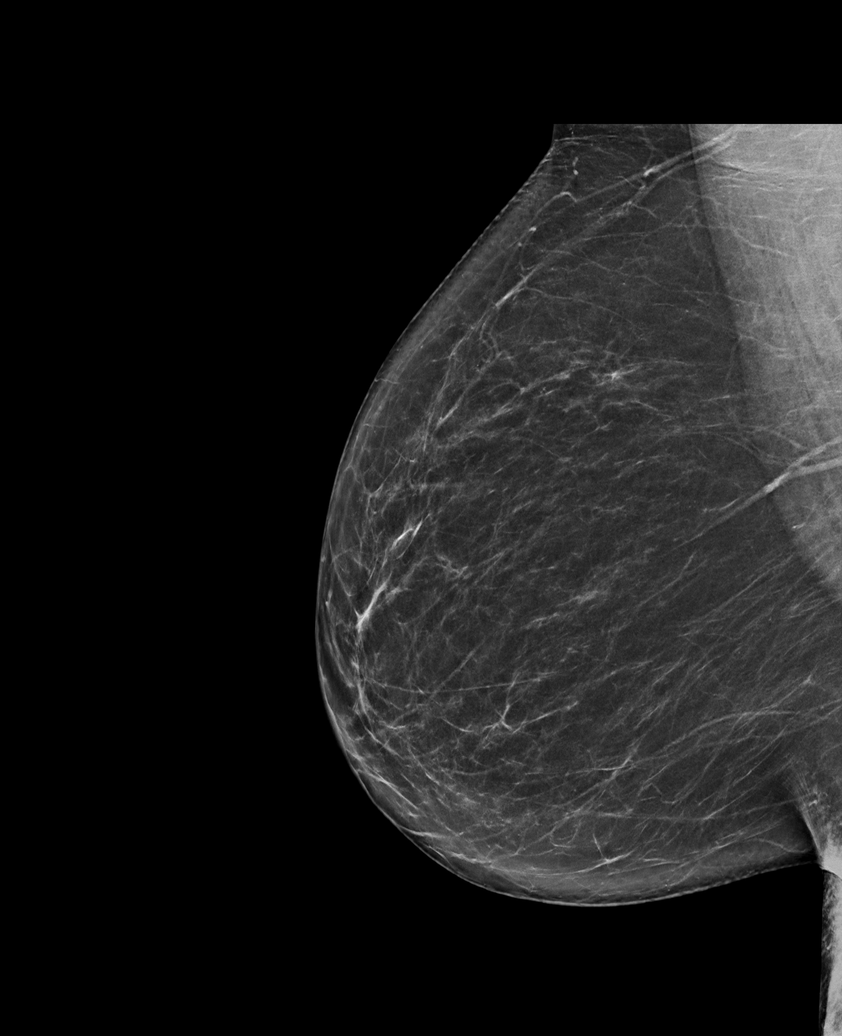

[R MLO tomo · tomo slice 39/76.0]
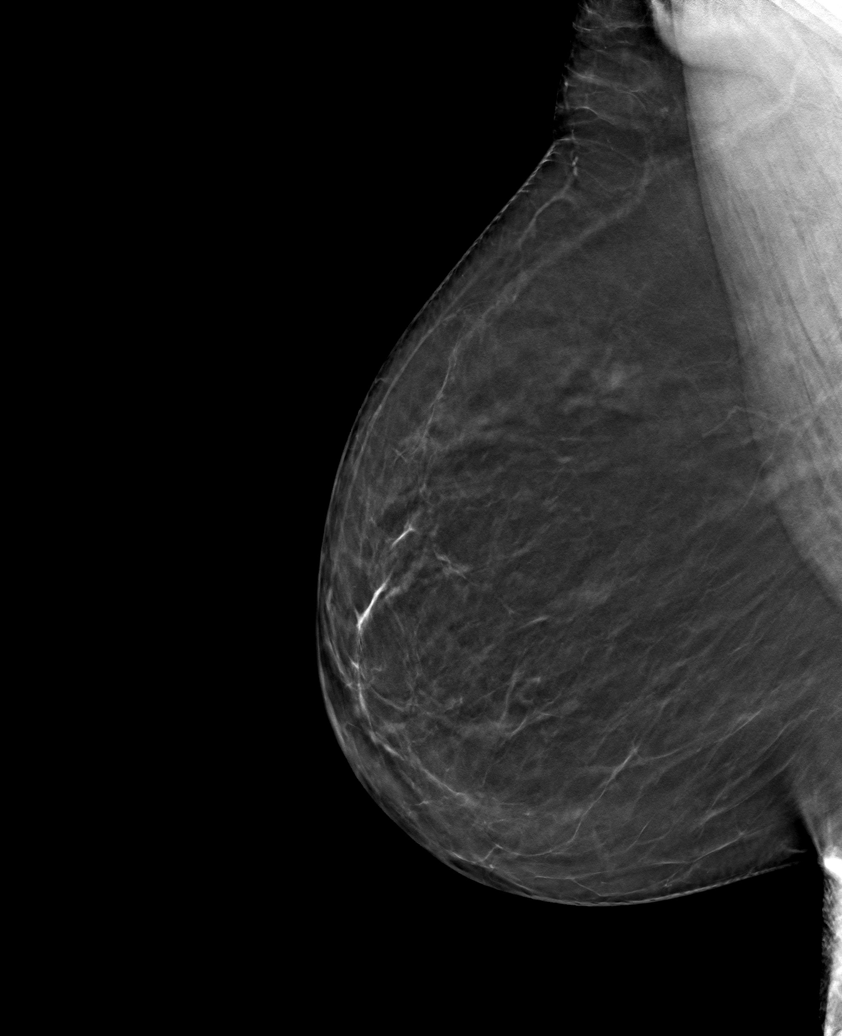

[6 of 30 positions shown; findings below may reference images not displayed]

FINDINGS: There are no findings suspicious for malignancy.
IMPRESSION: No mammographic evidence of malignancy. A result letter of this
screening mammogram will be mailed directly to the patient.

RECOMMENDATION:
Screening mammogram in one year. (Code:[8U])

BI-RADS CATEGORY  1: Negative.

## 2021-02-07 ENCOUNTER — Other Ambulatory Visit: Payer: Self-pay

## 2021-02-07 ENCOUNTER — Encounter (HOSPITAL_COMMUNITY)
Admission: RE | Admit: 2021-02-07 | Discharge: 2021-02-07 | Disposition: A | Discharge status: Home / Self Care | Payer: Medicare PPO | Source: Ambulatory Visit | Attending: Cardiology | Admitting: Cardiology

## 2021-02-07 DIAGNOSIS — I208 Other forms of angina pectoris: Secondary | ICD-10-CM | POA: Diagnosis not present

## 2021-02-09 ENCOUNTER — Other Ambulatory Visit: Payer: Self-pay

## 2021-02-09 ENCOUNTER — Encounter (HOSPITAL_COMMUNITY)
Admission: RE | Admit: 2021-02-09 | Discharge: 2021-02-09 | Disposition: A | Discharge status: Home / Self Care | Payer: Medicare PPO | Source: Ambulatory Visit | Attending: Cardiology | Admitting: Cardiology

## 2021-02-09 DIAGNOSIS — I208 Other forms of angina pectoris: Secondary | ICD-10-CM | POA: Diagnosis not present

## 2021-02-12 ENCOUNTER — Encounter (HOSPITAL_COMMUNITY)
Admission: RE | Admit: 2021-02-12 | Discharge: 2021-02-12 | Disposition: A | Discharge status: Home / Self Care | Payer: Medicare PPO | Source: Ambulatory Visit | Attending: Cardiology | Admitting: Cardiology

## 2021-02-12 ENCOUNTER — Other Ambulatory Visit: Payer: Self-pay

## 2021-02-12 DIAGNOSIS — I208 Other forms of angina pectoris: Secondary | ICD-10-CM | POA: Diagnosis not present

## 2021-02-13 NOTE — Progress Notes (Signed)
Reviewed home exercise Rx at home with patient. Pt has a stationary bike at home which she will use 2-3 times a week. Encouraged patient to work up to 30 minutes. Encouraged warm-up, cool-down and stretching with exercise. Reviewed THRR of 61-122 and encouraged to keep RPE between 11-13. Fluids encouraged with exercise. Reviewed weather parameters for temperature and humidity if exercising outdoors. Reviewed S/S to terminate exercise and when to call MD vs 911. Reviewed use of NTG. Pt verbalized understanding of the home exercise Rx and was provided a copy.   Lesly Rubenstein MS, ACSM-EP-C, CCRP

## 2021-02-14 ENCOUNTER — Other Ambulatory Visit: Payer: Self-pay

## 2021-02-14 ENCOUNTER — Encounter (HOSPITAL_COMMUNITY)
Admission: RE | Admit: 2021-02-14 | Discharge: 2021-02-14 | Disposition: A | Discharge status: Home / Self Care | Payer: Medicare PPO | Source: Ambulatory Visit | Attending: Cardiology | Admitting: Cardiology

## 2021-02-14 DIAGNOSIS — I208 Other forms of angina pectoris: Secondary | ICD-10-CM

## 2021-02-14 DIAGNOSIS — E781 Pure hyperglyceridemia: Secondary | ICD-10-CM | POA: Diagnosis not present

## 2021-02-16 ENCOUNTER — Encounter (HOSPITAL_COMMUNITY)
Admission: RE | Admit: 2021-02-16 | Discharge: 2021-02-16 | Disposition: A | Discharge status: Home / Self Care | Payer: Medicare PPO | Source: Ambulatory Visit | Attending: Cardiology | Admitting: Cardiology

## 2021-02-16 ENCOUNTER — Other Ambulatory Visit: Payer: Self-pay

## 2021-02-16 DIAGNOSIS — I208 Other forms of angina pectoris: Secondary | ICD-10-CM

## 2021-02-19 ENCOUNTER — Encounter (HOSPITAL_COMMUNITY)
Admission: RE | Admit: 2021-02-19 | Discharge: 2021-02-19 | Disposition: A | Discharge status: Home / Self Care | Payer: Medicare PPO | Source: Ambulatory Visit | Attending: Cardiology | Admitting: Cardiology

## 2021-02-19 ENCOUNTER — Other Ambulatory Visit: Payer: Self-pay

## 2021-02-19 DIAGNOSIS — I208 Other forms of angina pectoris: Secondary | ICD-10-CM

## 2021-02-21 ENCOUNTER — Other Ambulatory Visit: Payer: Self-pay

## 2021-02-21 ENCOUNTER — Encounter (HOSPITAL_COMMUNITY)
Admission: RE | Admit: 2021-02-21 | Discharge: 2021-02-21 | Disposition: A | Discharge status: Home / Self Care | Payer: Medicare PPO | Source: Ambulatory Visit | Attending: Cardiology | Admitting: Cardiology

## 2021-02-21 DIAGNOSIS — I208 Other forms of angina pectoris: Secondary | ICD-10-CM | POA: Diagnosis not present

## 2021-02-23 ENCOUNTER — Other Ambulatory Visit: Payer: Self-pay

## 2021-02-23 ENCOUNTER — Encounter (HOSPITAL_COMMUNITY)
Admission: RE | Admit: 2021-02-23 | Discharge: 2021-02-23 | Disposition: A | Discharge status: Home / Self Care | Payer: Medicare PPO | Source: Ambulatory Visit | Attending: Cardiology | Admitting: Cardiology

## 2021-02-23 VITALS — Ht 64.0 in | Wt 217.8 lb

## 2021-02-23 DIAGNOSIS — I208 Other forms of angina pectoris: Secondary | ICD-10-CM

## 2021-02-26 ENCOUNTER — Encounter (HOSPITAL_COMMUNITY)
Admission: RE | Admit: 2021-02-26 | Discharge: 2021-02-26 | Disposition: A | Discharge status: Home / Self Care | Payer: Medicare PPO | Source: Ambulatory Visit | Attending: Cardiology | Admitting: Cardiology

## 2021-02-26 ENCOUNTER — Other Ambulatory Visit: Payer: Self-pay

## 2021-02-26 DIAGNOSIS — I208 Other forms of angina pectoris: Secondary | ICD-10-CM

## 2021-02-27 NOTE — Progress Notes (Signed)
Cardiac Individual Treatment Plan  Patient Details  Name: [name] MRN: [id number] Date of Birth: 06-04-1951 Referring Provider:   Flowsheet Row CARDIAC REHAB PHASE II ORIENTATION from 01/04/2021 in [place]  Referring Provider Dr Lolita Patella MD, Dr Fransico Him MD, Covering      Initial Encounter Date:  [place] from 01/04/2021 in [place]  Date 01/04/21      Visit Diagnosis: Stable angina [facility])  Patient's Home Medications on Admission:  Current Outpatient Medications:  .  Ascorbic Acid (VITAMIN C) 500 MG CAPS, Take 500 mg by mouth daily., Disp: , Rfl:  .  aspirin 81 MG chewable tablet, Chew 81 mg by mouth daily., Disp: , Rfl:  .  atorvastatin (LIPITOR) 40 MG tablet, TAKE 1 TABLET BY MOUTH ONCE DAILY, Disp: 30 tablet, Rfl: 11 .  azithromycin (ZITHROMAX) 250 MG tablet, 2 tabs by mouth day 1 followed by one tab by mouth days 2-5, Disp: 6 tablet, Rfl: 0 .  B-D UF III MINI PEN NEEDLES 31G X 5 MM MISC, , Disp: , Rfl:  .  carvedilol (COREG) 12.5 MG tablet, Take 12.5 mg by mouth 2 (two) times daily. 02/19/21 patient now taking twice a day, Disp: , Rfl:  .  cholecalciferol (VITAMIN D) 1000 units tablet, Take 1,000 Units by mouth 3 (three) times daily., Disp: , Rfl:  .  ezetimibe (ZETIA) 10 MG tablet, Take 10 mg by mouth daily., Disp: , Rfl:  .  febuxostat (ULORIC) 40 MG tablet, Take 40 mg by mouth daily. , Disp: , Rfl:  .  glucose blood (ONETOUCH VERIO) test strip, Check blood sugar 3 x daily, Disp: 100 each, Rfl: 6 .  insulin lispro (HUMALOG) 100 UNIT/ML injection, Inject into the skin daily. Before meals for CBG greater then 250, Disp: , Rfl:  .  Insulin NPH Human, [place], (NOVOLIN N [place]), Inject 20 Units into the skin daily., Disp: , Rfl:  .  ketoconazole (NIZORAL) 2 % cream, Apply 1 application topically daily., Disp: 15 g, Rfl: 11 .  Lancets (ONETOUCH ULTRASOFT)  lancets, Use as instructed, Disp: 100 each, Rfl: 12 .  loratadine (CLARITIN) 10 MG tablet, Take 10 mg by mouth as needed. , Disp: , Rfl:  .  Multiple Vitamin (MULTIVITAMIN) capsule, Take 1 capsule by mouth daily., Disp: , Rfl:  .  nitroGLYCERIN (NITROSTAT) 0.4 MG SL tablet, Place 0.4 mg under the tongue every 5 (five) minutes as needed for chest pain., Disp: , Rfl:  .  promethazine (PHENERGAN) 25 MG tablet, Take 25 mg by mouth daily as needed for nausea., Disp: , Rfl:  .  sodium bicarbonate 650 MG tablet, Take 650 mg by mouth 4 (four) times daily., Disp: , Rfl:   Past Medical History: Past Medical History:  Diagnosis Date  . Allergic rhinitis   . Carbuncle, trunk   . Carpal tunnel syndrome   . Chronic kidney disease   . Combined hyperlipidemia associated with type 2 diabetes mellitus ([place])   . Complication of anesthesia   . Cyclical vomiting   . Dependent edema   . Diabetes mellitus type II   . Diabetic autonomic neuropathy ([place])   . Diabetic peripheral neuropathy associated with type 2 diabetes mellitus ([place])   . Dyspepsia   . Essential hypertension, benign   . GERD (gastroesophageal reflux disease)   . Goiter   . Hypercholesteremia   . Menopause   . Obesity   .  OSA (obstructive sleep apnea) 09/23/2016  . Osteopenia   . PONV (postoperative nausea and vomiting)   . Tachycardia   . Type II or unspecified type diabetes mellitus without mention of complication, not stated as uncontrolled     Tobacco Use: Social History   Tobacco Use  Smoking Status Never Smoker  Smokeless Tobacco Never Used    Labs: Recent Review Flowsheet Data    Labs for ITP Cardiac and Pulmonary Rehab Latest Ref Rng & Units 11/18/2014 02/23/2015 02/27/2018 03/04/2019 03/07/2020   Cholestrol <200 mg/dL 182 216(H) 194 206(H) 232(H)   LDLCALC mg/dL (calc) 88 NOT CALC 105(H) - -   HDL > OR = 50 mg/dL 38(L) 35(L) 32(L) 34(L) 36(L)   Trlycerides <150 mg/dL 282(H) 407(H) 398(H) 418(H) 646(H)   Hemoglobin A1c <5.7 %  of total Hgb - 6.2(H) 7.2(H) 7.7(H) 8.3(H)      Capillary Blood Glucose: Lab Results  Component Value Date   GLUCAP 113 (H) 01/10/2021   GLUCAP 148 (H) 01/10/2021   GLUCAP 97 01/08/2021   GLUCAP 126 (H) 01/08/2021   GLUCAP 161 (H) 01/04/2021     Exercise Target Goals: Exercise Program Goal: Individual exercise prescription set using results from initial 6 min walk test and THRR while considering  patient's activity barriers and safety.   Exercise Prescription Goal: Starting with aerobic activity 30 plus minutes a day, 3 days per week for initial exercise prescription. Provide home exercise prescription and guidelines that participant acknowledges understanding prior to discharge.  Activity Barriers & Risk Stratification:  Activity Barriers & Cardiac Risk Stratification - 01/04/21 1200      Activity Barriers & Cardiac Risk Stratification   Activity Barriers Deconditioning;Shortness of Breath;Balance Concerns;Chest Pain/Angina    Cardiac Risk Stratification High           6 Minute Walk:  6 Minute Walk    Row Name 01/04/21 0948 02/23/21 0850       6 Minute Walk   Phase Initial Discharge    Distance 1540 feet 1654 feet    Distance % Change -- 7.4 %    Distance Feet Change -- 114 ft    Walk Time 6 minutes 6 minutes    # of Rest Breaks 0 0    MPH 2.92 3.1    METS 3.26 3.7    RPE 12 13    Perceived Dyspnea  2 2    VO2 Peak 11.4 13.1    Symptoms Yes (comment) No    Comments SOB, RPD=2 SOB RPD = 2    Resting HR 71 bpm 84 bpm    Resting BP 148/80 134/76    Resting Oxygen Saturation  96 % 97 %    Exercise Oxygen Saturation  during 6 min walk 92 % 98 %    Max Ex. HR 109 bpm 131 bpm    Max Ex. BP 168/80 166/90    2 Minute Post BP 158/80 --           Oxygen Initial Assessment:   Oxygen Re-Evaluation:   Oxygen Discharge (Final Oxygen Re-Evaluation):   Initial Exercise Prescription:  Initial Exercise Prescription - 01/04/21 1200      Date of Initial  Exercise RX and Referring Provider   Date 01/04/21    Referring Provider Dr Lolita Patella MD, Dr Fransico Him MD, Covering    Expected Discharge Date 03/02/21      Bike   Level 1    Minutes 15    METs 2  NuStep   Level 2    SPM 85    Minutes 15    METs 1.7      Prescription Details   Frequency (times per week) 3    Duration Progress to 30 minutes of continuous aerobic without signs/symptoms of physical distress      Intensity   THRR 40-80% of Max Heartrate 61-122    Ratings of Perceived Exertion 11-13    Perceived Dyspnea 0-4      Progression   Progression Continue progressive overload as per policy without signs/symptoms or physical distress.      Resistance Training   Training Prescription Yes    Weight 3    Reps 10-15           Perform Capillary Blood Glucose checks as needed.  Exercise Prescription Changes:  Exercise Prescription Changes    Row Name 01/08/21 1000 01/19/21 1139 02/05/21 1300 02/07/21 1110 02/23/21 1200     Response to Exercise   Blood Pressure (Admit) 122/62 122/80 140/78 124/74 134/72   Blood Pressure (Exercise) 160/86 118/72 124/78 132/82 166/90   Blood Pressure (Exit) 108/70 130/80 130/70 124/70 112/72   Heart Rate (Admit) 72 bpm 89 bpm 91 bpm 72 bpm 84 bpm   Heart Rate (Exercise) 94 bpm 101 bpm 100 bpm 102 bpm 131 bpm   Heart Rate (Exit) 77 bpm 71 bpm 82 bpm 72 bpm 90 bpm   Rating of Perceived Exertion (Exercise) 11 12 12 11 13    Symptoms None None None None SOB, RPD = 2 with 6 min. walk test   Comments Pt's first day of exercise in the CRP2 program Reviewed METs Reviewed METs and Goals Reviewed home exercise Rx Reviewed METs   Duration Progress to 30 minutes of  aerobic without signs/symptoms of physical distress Continue with 30 min of aerobic exercise without signs/symptoms of physical distress. Continue with 30 min of aerobic exercise without signs/symptoms of physical distress. Continue with 30 min of aerobic exercise without  signs/symptoms of physical distress. Continue with 30 min of aerobic exercise without signs/symptoms of physical distress.   Intensity THRR unchanged THRR unchanged THRR unchanged THRR unchanged THRR unchanged     Progression   Progression Continue to progress workloads to maintain intensity without signs/symptoms of physical distress. Continue to progress workloads to maintain intensity without signs/symptoms of physical distress. Continue to progress workloads to maintain intensity without signs/symptoms of physical distress. Continue to progress workloads to maintain intensity without signs/symptoms of physical distress. Continue to progress workloads to maintain intensity without signs/symptoms of physical distress.   Average METs 2 2.1 2.65 2.7 3     Resistance Training   Training Prescription Yes Yes Yes No Yes   Weight 3 3 3  No weights on Wednesdays 3 lbs   Reps 10-15 10-15 10-15 -- 10-15   Time 10 Minutes 10 Minutes 10 Minutes -- 10 Minutes     Interval Training   Interval Training No No No No No     Bike   Level 1 2 2 2  --   Minutes 15 15 15 15  --   METs 2.1 2 3.2 3.3 --     NuStep   Level 2 2 2 3 3    SPM 85 845 85 85 85   Minutes 15 15 15 15 15    METs 1.9 2.2 2.1 2.1 2.3     Track   Laps -- -- -- -- 8   Minutes -- -- -- -- 6   METs -- -- -- --  3.7     Home Exercise Plan   Plans to continue exercise at -- -- -- Home (comment) Home (comment)   Frequency -- -- -- Add 2 additional days to program exercise sessions. Add 2 additional days to program exercise sessions.   Initial Home Exercises Provided -- -- -- 02/07/21 02/07/21          Exercise Comments:  Exercise Comments    Row Name 01/08/21 1217 01/19/21 1200 01/24/21 1555 02/05/21 1032 02/07/21 1114   Exercise Comments Pt's first day of exercise in the CRP2 program. Pt tolerated session well. Reviewed METs with patient. Pt is making good progress in the program and demostrates good attendance. Pt reports being  COVID postive and will be unable to exercise until cleared. Reviewed METs and goals. Pt has returned to the [place] [facility] program after COVID-19 illness. Pt tolerated the session well and we will continue to progress as tolerated. Reviewed home exercise Rx with patient today. Pt has returned S/P COVID-19 infection. Pt has a stationary bike she will use 2-3 x/week. 30 minutes encouraged. Pt verbalized understanding of the home exercise Rx and was provided a copy.   [place] Name 02/16/21 1151 02/23/21 1232         Exercise Comments Pt voices she has joined [employer] and has exercsie 2x/week there in addition to her CRP2 program. Pt is doing 20 minutes on Treadmill and 20 minutes on the stepper. Reviewed METS. Pt is making good prgoress and voices increased strength and stamina. Pt has joined the [employer] has been exercsing there for the last 2 weeks 45-60 minutes  x 2 days.             Exercise Goals and Review:  Exercise Goals    Row Name 01/04/21 1217             Exercise Goals   Increase Physical Activity Yes       Intervention Provide advice, education, support and counseling about physical activity/exercise needs.;Develop an individualized exercise prescription for aerobic and resistive training based on initial evaluation findings, risk stratification, comorbidities and participant's personal goals.       Expected Outcomes Short Term: Attend rehab on a regular basis to increase amount of physical activity.;Long Term: Add in home exercise to make exercise part of routine and to increase amount of physical activity.;Long Term: Exercising regularly at least 3-5 days a week.       Increase Strength and Stamina Yes       Intervention Provide advice, education, support and counseling about physical activity/exercise needs.;Develop an individualized exercise prescription for aerobic and resistive training based on initial evaluation findings, risk stratification, comorbidities and participant's  personal goals.       Expected Outcomes Short Term: Increase workloads from initial exercise prescription for resistance, speed, and METs.;Short Term: Perform resistance training exercises routinely during rehab and add in resistance training at home;Long Term: Improve cardiorespiratory fitness, muscular endurance and strength as measured by increased METs and functional capacity (6MWT)       Able to understand and use rate of perceived exertion (RPE) scale Yes       Intervention Provide education and explanation on how to use RPE scale       Expected Outcomes Short Term: Able to use RPE daily in rehab to express subjective intensity level;Long Term:  Able to use RPE to guide intensity level when exercising independently       Able to understand and use Dyspnea scale Yes  Intervention Provide education and explanation on how to use Dyspnea scale       Expected Outcomes Short Term: Able to use Dyspnea scale daily in rehab to express subjective sense of shortness of breath during exertion;Long Term: Able to use Dyspnea scale to guide intensity level when exercising independently       Knowledge and understanding of Target Heart Rate Range (THRR) Yes       Intervention Provide education and explanation of THRR including how the numbers were predicted and where they are located for reference       Expected Outcomes Short Term: Able to state/look up THRR;Short Term: Able to use daily as guideline for intensity in rehab;Long Term: Able to use THRR to govern intensity when exercising independently       Understanding of Exercise Prescription Yes       Intervention Provide education, explanation, and written materials on patient's individual exercise prescription       Expected Outcomes Short Term: Able to explain program exercise prescription;Long Term: Able to explain home exercise prescription to exercise independently              Exercise Goals Re-Evaluation :  Exercise Goals Re-Evaluation     [employer] Name 01/08/21 1138 02/05/21 1030           Exercise Goal Re-Evaluation   Exercise Goals Review Increase Physical Activity;Increase Strength and Stamina;Able to understand and use rate of perceived exertion (RPE) scale;Knowledge and understanding of Target Heart Rate Range (THRR);Understanding of Exercise Prescription Increase Strength and Stamina;Able to understand and use rate of perceived exertion (RPE) scale;Knowledge and understanding of Target Heart Rate Range (THRR);Understanding of Exercise Prescription      Comments Pt's first day of exercise in the CRP2 program. Pt understands the RPE scale, THRR, and Exercise Rx. Reviewed METs and Goals. Pt returning today after being out 2 weeks with COVID-19. Pt feels well and is motivated to resume the program. Pt tolerated today's  session well at her previous workloads. Pt has stationary bike at home and id ready to begin using at home. Will discuss home exercise with patient next session.      Expected Outcomes Will continue to montior patient and progress exercise workloads as tolerated. Will continue to montior patient and progress exercise workloads as tolerated.              Discharge Exercise Prescription (Final Exercise Prescription Changes):  Exercise Prescription Changes - 02/23/21 1200      Response to Exercise   Blood Pressure (Admit) 134/72    Blood Pressure (Exercise) 166/90    Blood Pressure (Exit) 112/72    Heart Rate (Admit) 84 bpm    Heart Rate (Exercise) 131 bpm    Heart Rate (Exit) 90 bpm    Rating of Perceived Exertion (Exercise) 13    Symptoms SOB, RPD = 2 with 6 min. walk test    Comments Reviewed METs    Duration Continue with 30 min of aerobic exercise without signs/symptoms of physical distress.    Intensity THRR unchanged      Progression   Progression Continue to progress workloads to maintain intensity without signs/symptoms of physical distress.    Average METs 3      Resistance Training   Training  Prescription Yes    Weight 3 lbs    Reps 10-15    Time 10 Minutes      Interval Training   Interval Training No      NuStep  Level 3    SPM 85    Minutes 15    METs 2.3      Track   Laps 8    Minutes 6    METs 3.7      Home Exercise Plan   Plans to continue exercise at Home (comment)    Frequency Add 2 additional days to program exercise sessions.    Initial Home Exercises Provided 02/07/21           Nutrition:  Target Goals: Understanding of nutrition guidelines, daily intake of sodium 1500mg , cholesterol 200mg , calories 30% from fat and 7% or less from saturated fats, daily to have 5 or more servings of fruits and vegetables.  Biometrics:  Pre Biometrics - 01/04/21 1221      Pre Biometrics   Height 5\' 4"  (1.626 m)    Weight 100.5 kg    Waist Circumference 51 inches    Hip Circumference 50 inches    Waist to Hip Ratio 1.02 %    BMI (Calculated) 38.01    Triceps Skinfold 42 mm    % Body Fat 52.5 %    Grip Strength 35 kg    Flexibility 10.5 in    Single Leg Stand 3.53 seconds           Post Biometrics - 02/23/21 0945       Post  Biometrics   Height 5\' 4"  (1.626 m)    Weight 98.8 kg    Waist Circumference 50.25 inches    Hip Circumference 50 inches    Waist to Hip Ratio 1.01 %    BMI (Calculated) 37.37    Triceps Skinfold 41 mm    % Body Fat 51.7 %    Grip Strength 30 kg    Flexibility 10.25 in    Single Leg Stand 4.56 seconds           Nutrition Therapy Plan and Nutrition Goals:  Nutrition Therapy & Goals - 01/12/21 1452      Nutrition Therapy   Diet TLC; protein/potassium/carb modified    Drug/Food Interactions Statins/Certain Fruits      Personal Nutrition Goals   Nutrition Goal Pt to identify food quantities necessary to achieve weight loss of 6-24 lb at graduation from cardiac rehab.    Personal Goal #2 Pt to build a healthy plate including vegetables, fruits, whole grains, and low-fat dairy products in a heart healthy meal plan.       Intervention Plan   Intervention Prescribe, educate and counsel regarding individualized specific dietary modifications aiming towards targeted core components such as weight, hypertension, lipid management, diabetes, heart failure and other comorbidities.;Nutrition handout(s) given to patient.    Expected Outcomes Short Term Goal: Understand basic principles of dietary content, such as calories, fat, sodium, cholesterol and nutrients.           Nutrition Assessments:  MEDIFICTS Score Key:  ?70 Need to make dietary changes   40-70 Heart Healthy Diet  ? 40 Therapeutic Level Cholesterol Diet  Flowsheet Row CARDIAC REHAB PHASE II EXERCISE from 01/12/2021 in [place]  Picture Your Plate Total Score on Admission 78     Picture Your Plate Scores:  <50 Unhealthy dietary pattern with much room for improvement.  41-50 Dietary pattern unlikely to meet recommendations for good health and room for improvement.  51-60 More healthful dietary pattern, with some room for improvement.   >60 Healthy dietary pattern, although there may be some specific behaviors that  could be improved.    Nutrition Goals Re-Evaluation:  Nutrition Goals Re-Evaluation    [place] Name 01/12/21 1453 02/23/21 1152           Goals   Current Weight 221 lb (100.2 kg) 218 lb 7.6 oz (99.1 kg)      Nutrition Goal Pt to identify food quantities necessary to achieve weight loss of 6-24 lb at graduation from cardiac rehab. Pt to identify food quantities necessary to achieve weight loss of 6-24 lb at graduation from cardiac rehab.      Comment -- 3 lbs weight loss in past month      Expected Outcome -- Weight loss             Personal Goal #2 Re-Evaluation   Personal Goal #2 Pt to build a healthy plate including vegetables, fruits, whole grains, and low-fat dairy products in a heart healthy meal plan. Pt to build a healthy plate including vegetables, fruits, whole grains, and low-fat  dairy products in a heart healthy meal plan.             Nutrition Goals Discharge (Final Nutrition Goals Re-Evaluation):  Nutrition Goals Re-Evaluation - 02/23/21 1152      Goals   Current Weight 218 lb 7.6 oz (99.1 kg)    Nutrition Goal Pt to identify food quantities necessary to achieve weight loss of 6-24 lb at graduation from cardiac rehab.    Comment 3 lbs weight loss in past month    Expected Outcome Weight loss      Personal Goal #2 Re-Evaluation   Personal Goal #2 Pt to build a healthy plate including vegetables, fruits, whole grains, and low-fat dairy products in a heart healthy meal plan.           Psychosocial: Target Goals: Acknowledge presence or absence of significant depression and/or stress, maximize coping skills, provide positive support system. Participant is able to verbalize types and ability to use techniques and skills needed for reducing stress and depression.  Initial Review & Psychosocial Screening:  Initial Psych Review & Screening - 01/04/21 1011      Initial Review   Current issues with None Identified      Family Dynamics   Good Support System? Yes    Comments [name] feels supported by her husband and daughter.  Pt daughter lives in [place]      Barriers   Psychosocial barriers to participate in program There are no identifiable barriers or psychosocial needs.;The patient should benefit from training in stress management and relaxation.      Screening Interventions   Interventions Encouraged to exercise           Quality of Life Scores:  Quality of Life - 02/26/21 1444      Quality of Life   Select Quality of Life      Quality of Life Scores   Health/Function Post 26.89 %    Socioeconomic Post 27.5 %    Psych/Spiritual Post 27.43 %    Family Post 28.8 %    GLOBAL Post 27.42 %          Scores of 19 and below usually indicate a poorer quality of life in these areas.  A difference of  2-3 points is a clinically meaningful  difference.  A difference of 2-3 points in the total score of the Quality of Life Index has been associated with significant improvement in overall quality of life, self-image, physical symptoms, and general health in studies assessing  change in quality of life.  PHQ-9: Recent Review Flowsheet Data    Depression screen [facility] 2/9 01/04/2021 03/08/2019 03/03/2018 08/30/2013 07/04/2013   Decreased Interest 0 0 0 0 0   Down, Depressed, Hopeless 0 0 0 0 0   PHQ - 2 Score 0 0 0 0 0   Altered sleeping 0 - - - -   Tired, decreased energy 1 - - - -   Change in appetite 0 - - - -   Feeling bad or failure about yourself  0 - - - -   Trouble concentrating 0 - - - -   Moving slowly or fidgety/restless 0 - - - -   Suicidal thoughts 0 - - - -   PHQ-9 Score 1 - - - -   Difficult doing work/chores Not difficult at all - - - -     Interpretation of Total Score  Total Score Depression Severity:  1-4 = Minimal depression, 5-9 = Mild depression, 10-14 = Moderate depression, 15-19 = Moderately severe depression, 20-27 = Severe depression   Psychosocial Evaluation and Intervention:   Psychosocial Re-Evaluation:  Psychosocial Re-Evaluation    Row Name 01/08/21 1039 02/02/21 1003 02/05/21 1226 02/27/21 1526       Psychosocial Re-Evaluation   Current issues with None Identified -- None Identified None Identified    Comments -- Unable to assess as [name] exercise is currently on hold Pat returned to exercise on 02/05/21 and voiced no increased stressors or concerns [name] has not voiced any concerns or stressors    Interventions Encouraged to attend Cardiac Rehabilitation for the exercise -- Encouraged to attend Cardiac Rehabilitation for the exercise Encouraged to attend Cardiac Rehabilitation for the exercise    Continue Psychosocial Services  No Follow up required -- No Follow up required No Follow up required           Psychosocial Discharge (Final Psychosocial Re-Evaluation):  Psychosocial Re-Evaluation -  02/27/21 1526      Psychosocial Re-Evaluation   Current issues with None Identified    Comments [name] has not voiced any concerns or stressors    Interventions Encouraged to attend Cardiac Rehabilitation for the exercise    Continue Psychosocial Services  No Follow up required           Vocational Rehabilitation: Provide vocational rehab assistance to qualifying candidates.   Vocational Rehab Evaluation & Intervention:  Vocational Rehab - 01/04/21 1418      Initial Vocational Rehab Evaluation & Intervention   Assessment shows need for Vocational Rehabilitation No   [name] is retired and does not need vocational rehab at this time          Education: Education Goals: Education classes will be provided on a weekly basis, covering required topics. Participant will state understanding/return demonstration of topics presented.  Learning Barriers/Preferences:  Learning Barriers/Preferences - 01/04/21 1225      Learning Barriers/Preferences   Learning Barriers Sight   Wears Glasses   Learning Preferences Video;Pictoral;Skilled Demonstration;Computer/Internet           Education Topics: Hypertension, Hypertension Reduction -Define heart disease and high blood pressure. Discus how high blood pressure affects the body and ways to reduce high blood pressure.   Exercise and Your Heart -Discuss why it is important to exercise, the FITT principles of exercise, normal and abnormal responses to exercise, and how to exercise safely.   Angina -Discuss definition of angina, causes of angina, treatment of angina, and how to decrease risk of having angina.  Cardiac Medications -Review what the following cardiac medications are used for, how they affect the body, and side effects that may occur when taking the medications.  Medications include Aspirin, Beta blockers, calcium channel blockers, ACE Inhibitors, angiotensin receptor blockers, diuretics, digoxin, and  antihyperlipidemics.   Congestive Heart Failure -Discuss the definition of CHF, how to live with CHF, the signs and symptoms of CHF, and how keep track of weight and sodium intake.   Heart Disease and Intimacy -Discus the effect sexual activity has on the heart, how changes occur during intimacy as we age, and safety during sexual activity.   Smoking Cessation / COPD -Discuss different methods to quit smoking, the health benefits of quitting smoking, and the definition of COPD.   Nutrition I: Fats -Discuss the types of cholesterol, what cholesterol does to the heart, and how cholesterol levels can be controlled.   Nutrition II: Labels -Discuss the different components of food labels and how to read food label   Heart Parts/Heart Disease and PAD -Discuss the anatomy of the heart, the pathway of blood circulation through the heart, and these are affected by heart disease.   Stress I: Signs and Symptoms -Discuss the causes of stress, how stress may lead to anxiety and depression, and ways to limit stress.   Stress II: Relaxation -Discuss different types of relaxation techniques to limit stress.   Warning Signs of Stroke / TIA -Discuss definition of a stroke, what the signs and symptoms are of a stroke, and how to identify when someone is having stroke.   Knowledge Questionnaire Score:  Knowledge Questionnaire Score - 02/26/21 1444      Knowledge Questionnaire Score   Post Score 24/24           Core Components/Risk Factors/Patient Goals at Admission:  Personal Goals and Risk Factors at Admission - 01/04/21 1232      Core Components/Risk Factors/Patient Goals on Admission    Weight Management Yes;Obesity;Weight Loss    Intervention Weight Management: Develop a combined nutrition and exercise program designed to reach desired caloric intake, while maintaining appropriate intake of nutrient and fiber, sodium and fats, and appropriate energy expenditure required for the  weight goal.;Weight Management: Provide education and appropriate resources to help participant work on and attain dietary goals.;Weight Management/Obesity: Establish reasonable short term and long term weight goals.;Obesity: Provide education and appropriate resources to help participant work on and attain dietary goals.    Admit Weight 221 lb 9 oz (100.5 kg)    Goal Weight: Long Term 150 lb (68 kg)   Patients Goal   Expected Outcomes Short Term: Continue to assess and modify interventions until short term weight is achieved;Long Term: Adherence to nutrition and physical activity/exercise program aimed toward attainment of established weight goal;Weight Maintenance: Understanding of the daily nutrition guidelines, which includes 25-35% calories from fat, 7% or less cal from saturated fats, less than 200mg  cholesterol, less than 1.5gm of sodium, & 5 or more servings of fruits and vegetables daily;Weight Loss: Understanding of general recommendations for a balanced deficit meal plan, which promotes 1-2 lb weight loss per week and includes a negative energy balance of [phone] kcal/d;Understanding recommendations for meals to include 15-35% energy as protein, 25-35% energy from fat, 35-60% energy from carbohydrates, less than 200mg  of dietary cholesterol, 20-35 gm of total fiber daily;Understanding of distribution of calorie intake throughout the day with the consumption of 4-5 meals/snacks    Diabetes Yes    Intervention Provide education about signs/symptoms and action to take  for hypo/hyperglycemia.;Provide education about proper nutrition, including hydration, and aerobic/resistive exercise prescription along with prescribed medications to achieve blood glucose in normal ranges: Fasting glucose 65-99 mg/dL    Expected Outcomes Short Term: Participant verbalizes understanding of the signs/symptoms and immediate care of hyper/hypoglycemia, proper foot care and importance of medication, aerobic/resistive  exercise and nutrition plan for blood glucose control.;Long Term: Attainment of HbA1C < 7%.    Hypertension Yes    Intervention Provide education on lifestyle modifcations including regular physical activity/exercise, weight management, moderate sodium restriction and increased consumption of fresh fruit, vegetables, and low fat dairy, alcohol moderation, and smoking cessation.;Monitor prescription use compliance.    Expected Outcomes Short Term: Continued assessment and intervention until BP is < 140/5mm HG in hypertensive participants. < 130/54mm HG in hypertensive participants with diabetes, heart failure or chronic kidney disease.;Long Term: Maintenance of blood pressure at goal levels.    Lipids Yes    Intervention Provide education and support for participant on nutrition & aerobic/resistive exercise along with prescribed medications to achieve LDL 70mg , HDL >40mg .    Expected Outcomes Short Term: Participant states understanding of desired cholesterol values and is compliant with medications prescribed. Participant is following exercise prescription and nutrition guidelines.;Long Term: Cholesterol controlled with medications as prescribed, with individualized exercise RX and with personalized nutrition plan. Value goals: LDL < 70mg , HDL > 40 mg.           Core Components/Risk Factors/Patient Goals Review:   Goals and Risk Factor Review    Row Name 01/08/21 1040 02/02/21 1004 02/05/21 1227 02/27/21 1528       Core Components/Risk Factors/Patient Goals Review   Personal Goals Review Weight Management/Obesity;Lipids;Diabetes;Hypertension Weight Management/Obesity;Lipids;Diabetes;Hypertension Weight Management/Obesity;Lipids;Diabetes;Hypertension Weight Management/Obesity;Lipids;Diabetes;Hypertension    Review [name] started exercise on 01/08/21 Did well with exercise. some moderate BP elvations on the nustep. [name] goals are to get healthy [name] exercise is currently on hold as  she tested positive for COVID 19 [name] returned to exercise on 2/722 and did well with exercise. VSS [name] has been doing well with exercise. Pat's vital signs and CBG's have been stable. [name] has lost 1.7 kg since starting the porgram. [name] will complete phase 2 cardiac rehab on 03/02/21    Expected Outcomes [name] will continue to participate inphase 2 cardiac rehab for exercise nutrition and lifestyle modification [name] will continue to participate inphase 2 cardiac rehab for exercise nutrition and lifestyle modification once cleared to return [name] will continue to participate in phase 2 cardiac rehab for exercise nutrition and lifestyle modifications [name] will continue to participate in phase 2 cardiac rehab for exercise nutrition and lifestyle modifications. [name] will continue exercise at the [facility] fitness center.           Core Components/Risk Factors/Patient Goals at Discharge (Final Review):   Goals and Risk Factor Review - 02/27/21 1528      Core Components/Risk Factors/Patient Goals Review   Personal Goals Review Weight Management/Obesity;Lipids;Diabetes;Hypertension    Review [name] has been doing well with exercise. Pat's vital signs and CBG's have been stable. [name] has lost 1.7 kg since starting the porgram. [name] will complete phase 2 cardiac rehab on 03/02/21    Expected Outcomes [name] will continue to participate in phase 2 cardiac rehab for exercise nutrition and lifestyle modifications. [name] will continue exercise at the [facility] fitness center.           ITP Comments:  ITP Comments    Row Name 01/04/21 7209 01/08/21 1038 02/02/21 1000 02/05/21 1225 02/27/21  1525   ITP Comments Dr Fransico Him MD, Medical Director 30 Day ITP REview. Lodie started exercise on 01/09/20 and did well with exercise 30 Day ITP Review. Makaylie is currently out as she tested positive for COVID 19. 30 Day ITP Review. Lynnzie returend to exercise on 02/05/21 and did well with  exercise 30 Day ITP Review. Cuma has good attendance and participation in phase 2 cardiac rehab. [name] will complete cardiac rehab on 03/02/21          Comments: See ITP comments.Barnet Pall, RN,BSN 02/27/2021 3:33 PM

## 2021-02-28 ENCOUNTER — Encounter (HOSPITAL_COMMUNITY)
Admission: RE | Admit: 2021-02-28 | Discharge: 2021-02-28 | Disposition: A | Discharge status: Home / Self Care | Payer: Medicare PPO | Source: Ambulatory Visit | Attending: Cardiology | Admitting: Cardiology

## 2021-02-28 ENCOUNTER — Other Ambulatory Visit: Payer: Self-pay

## 2021-02-28 DIAGNOSIS — I208 Other forms of angina pectoris: Secondary | ICD-10-CM | POA: Diagnosis not present

## 2021-03-02 ENCOUNTER — Other Ambulatory Visit: Payer: Self-pay

## 2021-03-02 ENCOUNTER — Encounter (HOSPITAL_COMMUNITY)
Admission: RE | Admit: 2021-03-02 | Discharge: 2021-03-02 | Disposition: A | Discharge status: Home / Self Care | Payer: Medicare PPO | Source: Ambulatory Visit | Attending: Cardiology | Admitting: Cardiology

## 2021-03-02 DIAGNOSIS — I208 Other forms of angina pectoris: Secondary | ICD-10-CM | POA: Diagnosis not present

## 2021-03-02 NOTE — Progress Notes (Signed)
Discharge Progress Report  Patient Details  Name: [name] MRN: [id number] Date of Birth: Nov 12, 1951 Referring Provider:   Flowsheet Row CARDIAC REHAB PHASE II ORIENTATION from 01/04/2021 in [facility]  Referring Provider Dr Lolita Patella MD, Dr Fransico Him MD, Covering        Number of Visits: 17  Reason for Discharge:  Patient reached a stable level of exercise. Patient independent in their exercise. Patient has met program and personal goals.  Smoking History:  Social History   Tobacco Use  Smoking Status Never Smoker  Smokeless Tobacco Never Used    Diagnosis:  Stable angina ([place])  ADL [facility]:    Initial Exercise Prescription:  Initial Exercise Prescription - 01/04/21 1200       Date of Initial Exercise RX and Referring Provider   Date 01/04/21    Referring Provider Dr Lolita Patella MD, Dr Fransico Him MD, Covering    Expected Discharge Date 03/02/21      Bike   Level 1    Minutes 15    METs 2      NuStep   Level 2    SPM 85    Minutes 15    METs 1.7      Prescription Details   Frequency (times per week) 3    Duration Progress to 30 minutes of continuous aerobic without signs/symptoms of physical distress      Intensity   THRR 40-80% of Max Heartrate 61-122    Ratings of Perceived Exertion 11-13    Perceived Dyspnea 0-4      Progression   Progression Continue progressive overload as per policy without signs/symptoms or physical distress.      Resistance Training   Training Prescription Yes    Weight 3    Reps 10-15             Discharge Exercise Prescription (Final Exercise Prescription Changes):  Exercise Prescription Changes - 02/23/21 1200       Response to Exercise   Blood Pressure (Admit) 134/72    Blood Pressure (Exercise) 166/90    Blood Pressure (Exit) 112/72    Heart Rate (Admit) 84 bpm    Heart Rate (Exercise) 131 bpm    Heart Rate (Exit) 90 bpm    Rating of  Perceived Exertion (Exercise) 13    Symptoms SOB, RPD = 2 with 6 min. walk test    Comments Reviewed METs    Duration Continue with 30 min of aerobic exercise without signs/symptoms of physical distress.    Intensity THRR unchanged      Progression   Progression Continue to progress workloads to maintain intensity without signs/symptoms of physical distress.    Average METs 3      Resistance Training   Training Prescription Yes    Weight 3 lbs    Reps 10-15    Time 10 Minutes      Interval Training   Interval Training No      NuStep   Level 3    SPM 85    Minutes 15    METs 2.3      Track   Laps 8    Minutes 6    METs 3.7      Home Exercise Plan   Plans to continue exercise at Home (comment)    Frequency Add 2 additional days to program exercise sessions.    Initial Home Exercises Provided 02/07/21  Functional Capacity:  6 Minute Walk     Row Name 01/04/21 0948 02/23/21 0850       6 Minute Walk   Phase Initial Discharge    Distance 1540 feet 1654 feet    Distance % Change -- 7.4 %    Distance Feet Change -- 114 ft    Walk Time 6 minutes 6 minutes    # of Rest Breaks 0 0    MPH 2.92 3.1    METS 3.26 3.7    RPE 12 13    Perceived Dyspnea  2 2    VO2 Peak 11.4 13.1    Symptoms Yes (comment) No    Comments SOB, RPD=2 SOB RPD = 2    Resting HR 71 bpm 84 bpm    Resting BP 148/80 134/76    Resting Oxygen Saturation  96 % 97 %    Exercise Oxygen Saturation  during 6 min walk 92 % 98 %    Max Ex. HR 109 bpm 131 bpm    Max Ex. BP 168/80 166/90    2 Minute Post BP 158/80 --             Psychological, QOL, Others - Outcomes: PHQ 2/9: Depression screen [facility] 2/9 03/02/2021 01/04/2021 03/08/2019 03/03/2018 08/30/2013  Decreased Interest 0 0 0 0 0  Down, Depressed, Hopeless 0 0 0 0 0  PHQ - 2 Score 0 0 0 0 0  Altered sleeping - 0 - - -  Tired, decreased energy - 1 - - -  Change in appetite - 0 - - -  Feeling bad or failure about yourself  - 0 - - -   Trouble concentrating - 0 - - -  Moving slowly or fidgety/restless - 0 - - -  Suicidal thoughts - 0 - - -  PHQ-9 Score - 1 - - -  Difficult doing work/chores - Not difficult at all - - -    Quality of Life:  Quality of Life - 02/26/21 1444       Quality of Life   Select Quality of Life      Quality of Life Scores   Health/Function Post 26.89 %    Socioeconomic Post 27.5 %    Psych/Spiritual Post 27.43 %    Family Post 28.8 %    GLOBAL Post 27.42 %             Personal Goals: Goals established at orientation with interventions provided to work toward goal.  Personal Goals and Risk Factors at Admission - 01/04/21 1232       Core Components/Risk Factors/Patient Goals on Admission    Weight Management Yes;Obesity;Weight Loss    Intervention Weight Management: Develop a combined nutrition and exercise program designed to reach desired caloric intake, while maintaining appropriate intake of nutrient and fiber, sodium and fats, and appropriate energy expenditure required for the weight goal.;Weight Management: Provide education and appropriate resources to help participant work on and attain dietary goals.;Weight Management/Obesity: Establish reasonable short term and long term weight goals.;Obesity: Provide education and appropriate resources to help participant work on and attain dietary goals.    Admit Weight 221 lb 9 oz (100.5 kg)    Goal Weight: Long Term 150 lb (68 kg)   Patients Goal   Expected Outcomes Short Term: Continue to assess and modify interventions until short term weight is achieved;Long Term: Adherence to nutrition and physical activity/exercise program aimed toward attainment of established weight goal;Weight Maintenance: Understanding of  the daily nutrition guidelines, which includes 25-35% calories from fat, 7% or less cal from saturated fats, less than $RemoveB'200mg'PqZYUebd$  cholesterol, less than 1.5gm of sodium, & 5 or more servings of fruits and vegetables daily;Weight Loss:  Understanding of general recommendations for a balanced deficit meal plan, which promotes 1-2 lb weight loss per week and includes a negative energy balance of [id number] kcal/d;Understanding recommendations for meals to include 15-35% energy as protein, 25-35% energy from fat, 35-60% energy from carbohydrates, less than $RemoveB'200mg'gwyAPZlp$  of dietary cholesterol, 20-35 gm of total fiber daily;Understanding of distribution of calorie intake throughout the day with the consumption of 4-5 meals/snacks    Diabetes Yes    Intervention Provide education about signs/symptoms and action to take for hypo/hyperglycemia.;Provide education about proper nutrition, including hydration, and aerobic/resistive exercise prescription along with prescribed medications to achieve blood glucose in normal ranges: Fasting glucose 65-99 mg/dL    Expected Outcomes Short Term: Participant verbalizes understanding of the signs/symptoms and immediate care of hyper/hypoglycemia, proper foot care and importance of medication, aerobic/resistive exercise and nutrition plan for blood glucose control.;Long Term: Attainment of HbA1C < 7%.    Hypertension Yes    Intervention Provide education on lifestyle modifcations including regular physical activity/exercise, weight management, moderate sodium restriction and increased consumption of fresh fruit, vegetables, and low fat dairy, alcohol moderation, and smoking cessation.;Monitor prescription use compliance.    Expected Outcomes Short Term: Continued assessment and intervention until BP is < 140/23mm HG in hypertensive participants. < 130/2mm HG in hypertensive participants with diabetes, heart failure or chronic kidney disease.;Long Term: Maintenance of blood pressure at goal levels.    Lipids Yes    Intervention Provide education and support for participant on nutrition & aerobic/resistive exercise along with prescribed medications to achieve LDL '70mg'$ , HDL >$Remo'40mg'mtrMN$ .    Expected Outcomes Short Term:  Participant states understanding of desired cholesterol values and is compliant with medications prescribed. Participant is following exercise prescription and nutrition guidelines.;Long Term: Cholesterol controlled with medications as prescribed, with individualized exercise RX and with personalized nutrition plan. Value goals: LDL < $Rem'70mg'NVeg$ , HDL > 40 mg.              Personal Goals Discharge:  Goals and Risk Factor Review     Row Name 01/08/21 1040 02/02/21 1004 02/05/21 1227 02/27/21 1528       Core Components/Risk Factors/Patient Goals Review   Personal Goals Review Weight Management/Obesity;Lipids;Diabetes;Hypertension Weight Management/Obesity;Lipids;Diabetes;Hypertension Weight Management/Obesity;Lipids;Diabetes;Hypertension Weight Management/Obesity;Lipids;Diabetes;Hypertension    Review Trayce started exercise on 01/08/21 Did well with exercise. some moderate BP elvations on the nustep. Kourtney's goals are to get healthy Nami's exercise is currently on hold as she tested positive for COVID 19 Aricka returned to exercise on 2/722 and did well with exercise. VSS Kenia has been doing well with exercise. Pat's vital signs and CBG's have been stable. [name] has lost 1.7 kg since starting the porgram. [name] will complete phase 2 cardiac rehab on 03/02/21    Expected Outcomes Patirica will continue to participate inphase 2 cardiac rehab for exercise nutrition and lifestyle modification Patirica will continue to participate inphase 2 cardiac rehab for exercise nutrition and lifestyle modification once cleared to return Patirica will continue to participate in phase 2 cardiac rehab for exercise nutrition and lifestyle modifications Patirica will continue to participate in phase 2 cardiac rehab for exercise nutrition and lifestyle modifications. Chip Boer will continue exercise at the [facility] fitness center.             Exercise Goals and Review:  Exercise Goals     Row Name 01/04/21 1217              Exercise Goals   Increase Physical Activity Yes       Intervention Provide advice, education, support and counseling about physical activity/exercise needs.;Develop an individualized exercise prescription for aerobic and resistive training based on initial evaluation findings, risk stratification, comorbidities and participant's personal goals.       Expected Outcomes Short Term: Attend rehab on a regular basis to increase amount of physical activity.;Long Term: Add in home exercise to make exercise part of routine and to increase amount of physical activity.;Long Term: Exercising regularly at least 3-5 days a week.       Increase Strength and Stamina Yes       Intervention Provide advice, education, support and counseling about physical activity/exercise needs.;Develop an individualized exercise prescription for aerobic and resistive training based on initial evaluation findings, risk stratification, comorbidities and participant's personal goals.       Expected Outcomes Short Term: Increase workloads from initial exercise prescription for resistance, speed, and METs.;Short Term: Perform resistance training exercises routinely during rehab and add in resistance training at home;Long Term: Improve cardiorespiratory fitness, muscular endurance and strength as measured by increased METs and functional capacity (6MWT)       Able to understand and use rate of perceived exertion (RPE) scale Yes       Intervention Provide education and explanation on how to use RPE scale       Expected Outcomes Short Term: Able to use RPE daily in rehab to express subjective intensity level;Long Term:  Able to use RPE to guide intensity level when exercising independently       Able to understand and use Dyspnea scale Yes       Intervention Provide education and explanation on how to use Dyspnea scale       Expected Outcomes Short Term: Able to use Dyspnea scale daily in rehab to express subjective sense of  shortness of breath during exertion;Long Term: Able to use Dyspnea scale to guide intensity level when exercising independently       Knowledge and understanding of Target Heart Rate Range (THRR) Yes       Intervention Provide education and explanation of THRR including how the numbers were predicted and where they are located for reference       Expected Outcomes Short Term: Able to state/look up THRR;Short Term: Able to use daily as guideline for intensity in rehab;Long Term: Able to use THRR to govern intensity when exercising independently       Understanding of Exercise Prescription Yes       Intervention Provide education, explanation, and written materials on patient's individual exercise prescription       Expected Outcomes Short Term: Able to explain program exercise prescription;Long Term: Able to explain home exercise prescription to exercise independently                Exercise Goals Re-Evaluation:  Exercise Goals Re-Evaluation     [place] Name 01/08/21 1138 02/05/21 1030           Exercise Goal Re-Evaluation   Exercise Goals Review Increase Physical Activity;Increase Strength and Stamina;Able to understand and use rate of perceived exertion (RPE) scale;Knowledge and understanding of Target Heart Rate Range (THRR);Understanding of Exercise Prescription Increase Strength and Stamina;Able to understand and use rate of perceived exertion (RPE) scale;Knowledge and understanding of Target Heart Rate Range (THRR);Understanding of Exercise Prescription  Comments Pt's first day of exercise in the [facility] program. Pt understands the RPE scale, THRR, and Exercise Rx. Reviewed METs and Goals. Pt returning today after being out 2 weeks with COVID-19. Pt feels well and is motivated to resume the program. Pt tolerated today's  session well at her previous workloads. Pt has stationary bike at home and id ready to begin using at home. Will discuss home exercise with patient next session.       Expected Outcomes Will continue to montior patient and progress exercise workloads as tolerated. Will continue to montior patient and progress exercise workloads as tolerated.               Nutrition & Weight - Outcomes:  Pre Biometrics - 01/04/21 1221       Pre Biometrics   Height 5\' 4"  (1.626 m)    Weight 100.5 kg    Waist Circumference 51 inches    Hip Circumference 50 inches    Waist to Hip Ratio 1.02 %    BMI (Calculated) 38.01    Triceps Skinfold 42 mm    % Body Fat 52.5 %    Grip Strength 35 kg    Flexibility 10.5 in    Single Leg Stand 3.53 seconds             Post Biometrics - 02/23/21 0945        Post  Biometrics   Height 5\' 4"  (1.626 m)    Weight 98.8 kg    Waist Circumference 50.25 inches    Hip Circumference 50 inches    Waist to Hip Ratio 1.01 %    BMI (Calculated) 37.37    Triceps Skinfold 41 mm    % Body Fat 51.7 %    Grip Strength 30 kg    Flexibility 10.25 in    Single Leg Stand 4.56 seconds             Nutrition:  Nutrition Therapy & Goals - 01/12/21 1452       Nutrition Therapy   Diet TLC; protein/potassium/carb modified    Drug/Food Interactions Statins/Certain Fruits      Personal Nutrition Goals   Nutrition Goal Pt to identify food quantities necessary to achieve weight loss of 6-24 lb at graduation from cardiac rehab.    Personal Goal #2 Pt to build a healthy plate including vegetables, fruits, whole grains, and low-fat dairy products in a heart healthy meal plan.      Intervention Plan   Intervention Prescribe, educate and counsel regarding individualized specific dietary modifications aiming towards targeted core components such as weight, hypertension, lipid management, diabetes, heart failure and other comorbidities.;Nutrition handout(s) given to patient.    Expected Outcomes Short Term Goal: Understand basic principles of dietary content, such as calories, fat, sodium, cholesterol and nutrients.              Nutrition Discharge:    Education Questionnaire Score:  Knowledge Questionnaire Score - 02/26/21 1444       Knowledge Questionnaire Score   Post Score 24/24             Goals reviewed with patient; copy given to patient.Pt graduated from cardiac rehab program on 03/02/21 with completion of 17 exercise sessions in Phase II. Pt maintained good attendance and progressed nicely during her participation in rehab as evidenced by increased MET level.   Medication list reconciled. Repeat  PHQ score-0  .  Pt has made significant lifestyle changes and should be commended  for her success. Pt feels she has achieved her goals during cardiac rehab. Pat increased her distance on her post exercise walk test by 114 feet  Pt plans to continue exercise at the [facility].Barnet Pall, RN,BSN 03/13/2021 1:32 PM

## 2021-03-09 ENCOUNTER — Other Ambulatory Visit: Payer: Self-pay

## 2021-03-09 ENCOUNTER — Other Ambulatory Visit: Payer: Medicare PPO | Admitting: Internal Medicine

## 2021-03-09 DIAGNOSIS — Z0000 Encounter for general adult medical examination without abnormal findings: Secondary | ICD-10-CM | POA: Diagnosis not present

## 2021-03-09 DIAGNOSIS — I10 Essential (primary) hypertension: Secondary | ICD-10-CM

## 2021-03-09 DIAGNOSIS — Z6836 Body mass index (BMI) 36.0-36.9, adult: Secondary | ICD-10-CM

## 2021-03-09 DIAGNOSIS — M810 Age-related osteoporosis without current pathological fracture: Secondary | ICD-10-CM

## 2021-03-09 DIAGNOSIS — E1169 Type 2 diabetes mellitus with other specified complication: Secondary | ICD-10-CM | POA: Diagnosis not present

## 2021-03-09 DIAGNOSIS — Z6835 Body mass index (BMI) 35.0-35.9, adult: Secondary | ICD-10-CM | POA: Diagnosis not present

## 2021-03-09 DIAGNOSIS — N184 Chronic kidney disease, stage 4 (severe): Secondary | ICD-10-CM

## 2021-03-09 DIAGNOSIS — E785 Hyperlipidemia, unspecified: Secondary | ICD-10-CM | POA: Diagnosis not present

## 2021-03-09 DIAGNOSIS — E8881 Metabolic syndrome: Secondary | ICD-10-CM

## 2021-03-09 DIAGNOSIS — M1A09X0 Idiopathic chronic gout, multiple sites, without tophus (tophi): Secondary | ICD-10-CM | POA: Diagnosis not present

## 2021-03-13 ENCOUNTER — Encounter: Payer: Medicare PPO | Admitting: Internal Medicine

## 2021-03-14 DIAGNOSIS — N184 Chronic kidney disease, stage 4 (severe): Secondary | ICD-10-CM | POA: Diagnosis not present

## 2021-03-14 DIAGNOSIS — E781 Pure hyperglyceridemia: Secondary | ICD-10-CM | POA: Diagnosis not present

## 2021-03-14 DIAGNOSIS — I2510 Atherosclerotic heart disease of native coronary artery without angina pectoris: Secondary | ICD-10-CM | POA: Diagnosis not present

## 2021-03-14 DIAGNOSIS — Z5181 Encounter for therapeutic drug level monitoring: Secondary | ICD-10-CM | POA: Diagnosis not present

## 2021-03-15 ENCOUNTER — Other Ambulatory Visit: Payer: Self-pay

## 2021-03-15 ENCOUNTER — Telehealth: Payer: Self-pay | Admitting: Internal Medicine

## 2021-03-15 ENCOUNTER — Ambulatory Visit (INDEPENDENT_AMBULATORY_CARE_PROVIDER_SITE_OTHER): Payer: Medicare PPO | Admitting: Internal Medicine

## 2021-03-15 ENCOUNTER — Encounter: Payer: Self-pay | Admitting: Internal Medicine

## 2021-03-15 VITALS — BP 110/80 | HR 90 | Ht 64.0 in | Wt 217.0 lb

## 2021-03-15 DIAGNOSIS — M810 Age-related osteoporosis without current pathological fracture: Secondary | ICD-10-CM

## 2021-03-15 DIAGNOSIS — N184 Chronic kidney disease, stage 4 (severe): Secondary | ICD-10-CM

## 2021-03-15 DIAGNOSIS — Z0000 Encounter for general adult medical examination without abnormal findings: Secondary | ICD-10-CM | POA: Diagnosis not present

## 2021-03-15 DIAGNOSIS — Z6837 Body mass index (BMI) 37.0-37.9, adult: Secondary | ICD-10-CM | POA: Diagnosis not present

## 2021-03-15 DIAGNOSIS — E875 Hyperkalemia: Secondary | ICD-10-CM

## 2021-03-15 DIAGNOSIS — E8881 Metabolic syndrome: Secondary | ICD-10-CM

## 2021-03-15 DIAGNOSIS — Z8739 Personal history of other diseases of the musculoskeletal system and connective tissue: Secondary | ICD-10-CM

## 2021-03-15 DIAGNOSIS — E785 Hyperlipidemia, unspecified: Secondary | ICD-10-CM

## 2021-03-15 DIAGNOSIS — E119 Type 2 diabetes mellitus without complications: Secondary | ICD-10-CM

## 2021-03-15 DIAGNOSIS — I10 Essential (primary) hypertension: Secondary | ICD-10-CM

## 2021-03-15 DIAGNOSIS — E1169 Type 2 diabetes mellitus with other specified complication: Secondary | ICD-10-CM

## 2021-03-15 DIAGNOSIS — Z7184 Encounter for health counseling related to travel: Secondary | ICD-10-CM

## 2021-03-15 LAB — COMPLETE METABOLIC PANEL WITH GFR
AG Ratio: 1.6 (calc) (ref 1.0–2.5)
ALT: 19 U/L (ref 6–29)
AST: 19 U/L (ref 10–35)
Albumin: 3.9 g/dL (ref 3.6–5.1)
Alkaline phosphatase (APISO): 120 U/L (ref 37–153)
BUN/Creatinine Ratio: 23 (calc) — ABNORMAL HIGH (ref 6–22)
BUN: 51 mg/dL — ABNORMAL HIGH (ref 7–25)
CO2: 23 mmol/L (ref 20–32)
Calcium: 9.5 mg/dL (ref 8.6–10.4)
Chloride: 112 mmol/L — ABNORMAL HIGH (ref 98–110)
Creat: 2.18 mg/dL — ABNORMAL HIGH (ref 0.50–0.99)
GFR, Est African American: 26 mL/min/{1.73_m2} — ABNORMAL LOW (ref >=60)
GFR, Est Non African American: 23 mL/min/{1.73_m2} — ABNORMAL LOW (ref >=60)
Globulin: 2.5 g/dL (calc) (ref 1.9–3.7)
Glucose, Bld: 110 mg/dL — ABNORMAL HIGH (ref 65–99)
Potassium: 5.6 mmol/L — ABNORMAL HIGH (ref 3.5–5.3)
Sodium: 142 mmol/L (ref 135–146)
Total Bilirubin: 0.4 mg/dL (ref 0.2–1.2)
Total Protein: 6.4 g/dL (ref 6.1–8.1)

## 2021-03-15 LAB — CBC WITH DIFFERENTIAL/PLATELET
Absolute Monocytes: 689 cells/uL (ref 200–950)
Basophils Absolute: 49 cells/uL (ref 0–200)
Basophils Relative: 0.6 %
Eosinophils Absolute: 381 cells/uL (ref 15–500)
Eosinophils Relative: 4.7 %
HCT: 40.1 % (ref 35.0–45.0)
Hemoglobin: 13.2 g/dL (ref 11.7–15.5)
Lymphs Abs: 1304 cells/uL (ref 850–3900)
MCH: 31.1 pg (ref 27.0–33.0)
MCHC: 32.9 g/dL (ref 32.0–36.0)
MCV: 94.4 fL (ref 80.0–100.0)
MPV: 9.3 fL (ref 7.5–12.5)
Monocytes Relative: 8.5 %
Neutro Abs: 5678 cells/uL (ref 1500–7800)
Neutrophils Relative %: 70.1 %
Platelets: 296 10*3/uL (ref 140–400)
RBC: 4.25 10*6/uL (ref 3.80–5.10)
RDW: 14 % (ref 11.0–15.0)
Total Lymphocyte: 16.1 %
WBC: 8.1 10*3/uL (ref 3.8–10.8)

## 2021-03-15 LAB — LIPID PANEL
Cholesterol: 153 mg/dL (ref <200)
HDL: 31 mg/dL — ABNORMAL LOW (ref >=50)
LDL Cholesterol (Calc): 84 mg/dL (calc)
Non-HDL Cholesterol (Calc): 122 mg/dL (calc) (ref <130)
Total CHOL/HDL Ratio: 4.9 (calc) (ref <5.0)
Triglycerides: 319 mg/dL — ABNORMAL HIGH (ref <150)

## 2021-03-15 LAB — POCT URINALYSIS DIPSTICK
Appearance: NEGATIVE
Bilirubin, UA: NEGATIVE
Blood, UA: NEGATIVE
Glucose, UA: NEGATIVE
Ketones, UA: NEGATIVE
Leukocytes, UA: NEGATIVE
Nitrite, UA: NEGATIVE
Odor: NEGATIVE
Protein, UA: POSITIVE — AB
Spec Grav, UA: 1.01 (ref 1.010–1.025)
Urobilinogen, UA: 0.2 E.U./dL
pH, UA: 6.5 (ref 5.0–8.0)

## 2021-03-15 LAB — TSH: TSH: 1.64 mIU/L (ref 0.40–4.50)

## 2021-03-15 LAB — HEMOGLOBIN A1C
Hgb A1c MFr Bld: 6.8 % of total Hgb — ABNORMAL HIGH (ref <5.7)
Mean Plasma Glucose: 148 mg/dL
eAG (mmol/L): 8.2 mmol/L

## 2021-03-15 LAB — TEST AUTHORIZATION

## 2021-03-15 LAB — MICROALBUMIN / CREATININE URINE RATIO
Creatinine, Urine: 60 mg/dL (ref 20–275)
Microalb Creat Ratio: 375 mcg/mg creat — ABNORMAL HIGH (ref <30)
Microalb, Ur: 22.5 mg/dL

## 2021-03-15 LAB — URIC ACID: Uric Acid, Serum: 4.3 mg/dL (ref 2.5–7.0)

## 2021-03-15 MED ORDER — AZITHROMYCIN 250 MG PO TABS: ORAL_TABLET | ORAL | 0 refills | Status: DC

## 2021-03-15 NOTE — Telephone Encounter (Signed)
Faxed Lab results to [facility] [phone], phone [phone]

## 2021-03-15 NOTE — Progress Notes (Signed)
Subjective:    Patient ID: [name], female    DOB: 1951/01/17, 70 y.o.   MRN: [id number]  HPI 70 year old Female seen for Medicare wellness and health maintenance exam as well as evaluation of medical issues. Planning travel to [place] soon. Have sent in [place] for travel if needed.  Her K is high but has been eating greens, she says. Needs to follow renal diet will need K repeated without OV in a couple of weeks when is is on her renal renal diet.  Says renal functions are stable and there are no upcoming plans for a transplant.  Followed by Dr. Chalmers Cater, Endocrinologist and Dr. Edwin Dada at [facility] in addition to [facility].  History of tubular adenoma 2018/04/18 on colonoscopy with Dr. Collene Mares  History of osteoporosis but is not wanting to be on therapy.  Dr. Lorrene Reid had told her she could take Prolia but patient has declined thus far.  She says Dr. Lorrene Reid did not want her to have any live virus vaccines.  She has annual diabetic eye exam.  She is allergic to Sulfa and says that Altace caused cough in the past.  Since her dry weight is 212 pounds.  History of GE reflux, allergic rhinitis, dependent edema, carpal tunnel syndrome 04/18/2005, ganglion cyst removed from right wrist 1959, C-section 1992/04/18, D&C 1998.  In 04/18/2006 she had right-sided flank pain.  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  Creatinine was mildly elevated at 1.5.  In January 2008 she was admitted with dehydration and renal insufficiency.  Creatinine initially was 1.6 but improved hydration to 1.19.  He was on antibiotics for a wound infection and nausea and vomiting.  In July 2009 she was admitted with acute renal failure creatinine up to 6.0.  She had a left ureteral stent placed.  Renogram showed poor function and poor clearance of the tracer from left kidney as well as increasing retention  of the right kidney.  Retrograde pyelogram showed no evidence of obstruction.  It was never clear exactly what caused the acute renal failure.  It was thought perhaps to be acute on chronic injury.  There was possible contribution of obstruction and metformin.  Repair by Dr. Theda Sers again in 2009-04-18.  An incisional ventral hernia repair with mesh December 2011 by Dr. Dossie Der technique.  Baseline creatinine was 2.24 today.  Social history: She is married.  1 adult daughter who is a Lawyer in [place].  She does not smoke.  Occasional wine consumption.  Formerly worked for [employer] but has retired.  Family history: Father with history of aortic valve replacement, hyperlipidemia, rheumatic heart disease.  Mother with history of hypertension, hypothyroidism, hyperlipidemia, COPD.  1 sister died in 04-18-2004 with coronary artery disease and hyperlipidemia.  1 sister living with hyperlipidemia.   Patient will return March 28 to have potassium rechecked.  She will watch her diet.  If it remains elevated she will need to speak with her Nephrologist   Review of Systems no new complaints looking forward to her trip. I Have prescribed a Z-Pak for her to take with her     Objective:   Physical Exam Blood pressure 110/80 pulse 90 regular pulse oximetry 96% weight 217 pounds BMI 37.25  Skin: Warm and dry.  Nodes none.  TMs are clear.  Neck is supple.  Chest clear.  Cardiac  exam regular rate and rhythm normal S1 and S2.  Abdomen soft nondistended without hepatosplenomegaly masses or tenderness.  No pitting edema of the lower extremities.  Pap was taken in 2020 will not be repeated.  Bimanual normal.  Rectovaginal confirms.  Neuro exam is intact without gross focal deficits.       Assessment & Plan:  Elevated serum potassium-she is to repeat this test March 28 and watch her diet in the meantime  Type 2 diabetes mellitus-followed by Dr. Chalmers Cater  Essential  hypertension-stable  Hypertriglyceridemia intolerant of Tricor- triglycerides have improved from 626 a year ago to 319  BMI 37-this is concerning to me.  She needs to lose weight  Metabolic syndrome  History of gout  History of vitamin D deficiency  Chronic kidney disease stage IV followed by [facility].  Creatinine is 2.18 and seems fairly stable.  No plan she says for transplant.  Plan: See above regarding elevated serum potassium and please repeat potassium.  Continue current medications.  Zithromax Z-PAK as been prescribed for her travel to [place].  Return in 1 year or as needed.  Subjective:   Patient presents for Medicare Annual/Subsequent preventive examination.  Review Past Medical/Family/Social: See above   Risk Factors  Current exercise habits: Not a lot Dietary issues discussed: Low-fat, low carbohydrate ,low potassium  Cardiac risk factors: Diabetes and hyperlipidemia  Depression Screen  (Note: if answer to either of the following is "Yes", a more complete depression screening is indicated)   Over the past two weeks, have you felt down, depressed or hopeless? No  Over the past two weeks, have you felt little interest or pleasure in doing things? No Have you lost interest or pleasure in daily life? No Do you often feel hopeless? No Do you cry easily over simple problems? No   Activities of Daily Living  In your present state of health, do you have any difficulty performing the following activities?:   Driving? No  Managing money? No  Feeding yourself? No  Getting from bed to chair? No  Climbing a flight of stairs? No  Preparing food and eating?: No  Bathing or showering? No  Getting dressed: No  Getting to the toilet? No  Using the toilet:No  Moving around from place to place: No  In the past year have you fallen or had a near fall?:No  Are you sexually active?  Yes Do you have more than one partner? No   Hearing Difficulties: No  Do  you often ask people to speak up or repeat themselves? No  Do you experience ringing or noises in your ears? No  Do you have difficulty understanding soft or whispered voices? No  Do you feel that you have a problem with memory? No Do you often misplace items? No    Home Safety:  Do you have a smoke alarm at your residence? Yes Do you have grab bars in the bathroom?  Yes Do you have throw rugs in your house?  None   Cognitive Testing  Alert? Yes Normal Appearance?Yes  Oriented to person? Yes Place? Yes  Time? Yes  Recall of three objects? Yes  Can perform simple calculations? Yes  Displays appropriate judgment?Yes  Can read the correct time from a watch face?Yes   List the Names of Other Physician/Practitioners you currently use:  See referral list for the physicians patient is currently seeing.  [facility]   Review of Systems: See above   Objective:  General appearance: Appears stated age and obese  Head: Normocephalic, without obvious abnormality, atraumatic  Eyes: conj clear, EOMi PEERLA  Ears: normal TM's and external ear canals both ears  Nose: Nares normal. Septum midline. Mucosa normal. No drainage or sinus tenderness.  Throat: lips, mucosa, and tongue normal; teeth and gums normal  Neck: no adenopathy, no carotid bruit, no JVD, supple, symmetrical, trachea midline and thyroid not enlarged, symmetric, no tenderness/mass/nodules  No CVA tenderness.  Lungs: clear to auscultation bilaterally  Breasts: normal appearance, no masses or tenderness, top of the pacemaker on left upper chest. Incision well-healed. It is tender.  Heart: regular rate and rhythm, S1, S2 normal, no murmur, click, rub or gallop  Abdomen: soft, non-tender; bowel sounds normal; no masses, no organomegaly  Musculoskeletal: ROM normal in all joints, no crepitus, no deformity, Normal muscle strengthen. Back  is symmetric, no curvature. Skin: Skin color, texture, turgor normal. No  rashes or lesions  Lymph nodes: Cervical, supraclavicular, and axillary nodes normal.  Neurologic: CN 2 -12 Normal, Normal symmetric reflexes. Normal coordination and gait  Psych: Alert & Oriented x 3, Mood appear stable.    Assessment:    Annual wellness medicare exam   Plan:    During the course of the visit the patient was educated and counseled about appropriate screening and preventive services including:   Immunizations are up-to-date  Annual mammogram  Had bone density 2021      Patient Instructions (the written plan) was given to the patient.  Medicare Attestation  I have personally reviewed:  The patient's medical and social history  Their use of alcohol, tobacco or illicit drugs  Their current medications and supplements  The patient's functional ability including ADLs,fall risks, home safety risks, cognitive, and hearing and visual impairment  Diet and physical activities  Evidence for depression or mood disorders  The patient's weight, height, BMI, and visual acuity have been recorded in the chart. I have made referrals, counseling, and provided education to the patient based on review of the above and I have provided the patient with a written personalized care plan for preventive services.

## 2021-03-21 DIAGNOSIS — L821 Other seborrheic keratosis: Secondary | ICD-10-CM | POA: Diagnosis not present

## 2021-03-21 DIAGNOSIS — D225 Melanocytic nevi of trunk: Secondary | ICD-10-CM | POA: Diagnosis not present

## 2021-03-21 DIAGNOSIS — L578 Other skin changes due to chronic exposure to nonionizing radiation: Secondary | ICD-10-CM | POA: Diagnosis not present

## 2021-03-21 DIAGNOSIS — Z85820 Personal history of malignant melanoma of skin: Secondary | ICD-10-CM | POA: Diagnosis not present

## 2021-03-21 DIAGNOSIS — L814 Other melanin hyperpigmentation: Secondary | ICD-10-CM | POA: Diagnosis not present

## 2021-03-21 DIAGNOSIS — Z808 Family history of malignant neoplasm of other organs or systems: Secondary | ICD-10-CM | POA: Diagnosis not present

## 2021-03-26 ENCOUNTER — Other Ambulatory Visit: Payer: Medicare PPO | Admitting: Internal Medicine

## 2021-03-26 ENCOUNTER — Other Ambulatory Visit: Payer: Self-pay

## 2021-03-26 DIAGNOSIS — E875 Hyperkalemia: Secondary | ICD-10-CM | POA: Diagnosis not present

## 2021-03-27 DIAGNOSIS — I10 Essential (primary) hypertension: Secondary | ICD-10-CM | POA: Diagnosis not present

## 2021-03-27 DIAGNOSIS — E1165 Type 2 diabetes mellitus with hyperglycemia: Secondary | ICD-10-CM | POA: Diagnosis not present

## 2021-03-27 DIAGNOSIS — E063 Autoimmune thyroiditis: Secondary | ICD-10-CM | POA: Diagnosis not present

## 2021-03-27 DIAGNOSIS — E7800 Pure hypercholesterolemia, unspecified: Secondary | ICD-10-CM | POA: Diagnosis not present

## 2021-03-27 DIAGNOSIS — N184 Chronic kidney disease, stage 4 (severe): Secondary | ICD-10-CM | POA: Diagnosis not present

## 2021-03-27 LAB — POTASSIUM: Potassium: 5.4 mmol/L — ABNORMAL HIGH (ref 3.5–5.3)

## 2021-03-28 ENCOUNTER — Telehealth (INDEPENDENT_AMBULATORY_CARE_PROVIDER_SITE_OTHER): Payer: Medicare PPO | Admitting: Internal Medicine

## 2021-03-28 ENCOUNTER — Other Ambulatory Visit: Payer: Self-pay

## 2021-03-28 ENCOUNTER — Encounter: Payer: Self-pay | Admitting: Internal Medicine

## 2021-03-28 ENCOUNTER — Telehealth: Payer: Self-pay

## 2021-03-28 VITALS — BP 120/79 | Temp 97.7°F

## 2021-03-28 DIAGNOSIS — N184 Chronic kidney disease, stage 4 (severe): Secondary | ICD-10-CM

## 2021-03-28 DIAGNOSIS — E785 Hyperlipidemia, unspecified: Secondary | ICD-10-CM

## 2021-03-28 DIAGNOSIS — J069 Acute upper respiratory infection, unspecified: Secondary | ICD-10-CM

## 2021-03-28 DIAGNOSIS — E1169 Type 2 diabetes mellitus with other specified complication: Secondary | ICD-10-CM

## 2021-03-28 DIAGNOSIS — I10 Essential (primary) hypertension: Secondary | ICD-10-CM

## 2021-03-28 MED ORDER — AZITHROMYCIN 250 MG PO TABS: ORAL_TABLET | ORAL | 0 refills | Status: DC

## 2021-03-28 MED ORDER — PREDNISONE 10 MG PO TABS: ORAL_TABLET | ORAL | 0 refills | Status: DC

## 2021-03-28 NOTE — Telephone Encounter (Signed)
Today.

## 2021-03-28 NOTE — Patient Instructions (Signed)
Take Zithromax Z-PAK 2 tabs day 1 followed by 1 tab days 2 through 5.  Take prednisone in tapering course as directed starting with 6 tablets day 1 and decreasing by 1 tablet daily i.e. 6-5-4-3-2-1 taper.  Rest and drink fluids.  Call if not better in 48 hours or sooner if worse.

## 2021-03-28 NOTE — Telephone Encounter (Signed)
Patient has a sinus infection, runny nose, temperature is normal. Going on since Monday afternoon. No cough, no sore throat. She would like to do a virtual visit today or tomorrow. Home covid test this morning was negative.

## 2021-03-28 NOTE — Progress Notes (Addendum)
   Subjective:    Patient ID: [name], female    DOB: [birth year], 70 y.o.   MRN: [id number]  HPI 70 year old  Female with history of Diabetes mellitus followed by Dr. Chalmers Cater, Endocrinologist here in [place], chronic kidney disease on transplant list at [facility], hypertension, GE reflux, dependent edema seen today via interactive audio and video telecommunications due to Coronavirus pandemic.  Patient is agreeable to visit in this format today.  She is at her home and I am in my office.  She is identified using 2 identifiers as [name], a longstanding patient in this practice.  Patient was just seen here March 17 for health maintenance exam and Medicare wellness visit.  At that time she told me that renal functions had been stable and there were no upcoming plans for transplant.  Dr. Edwin Dada follows her at [facility] cardiology in addition to [facility] and Dr. Chalmers Cater.  She is planning a trip to [place] in the near future.  When she was here on March 17, I sent in a Z-Pak for her to take with her on her trip.  Patient was cleaning out a family home that was very dusty recently.  She took a Covid test this morning and it was negative.  Symptoms started on Monday, March 28.  Complaining of maxillary sinus pressure, runny nose but no fever.  No cough or sore throat.  No shaking chills.  She has had 3 COVID-19 immunizations the last 1 being in November 2021.  She has had Prevnar 13 and pneumococcal 23 immunizations as well.  She had flu vaccine in September 2021.  Review of Systems see above-denies wheezing or shortness of breath.  Denies nausea or vomiting.     Objective:   Physical Exam  Patient states she is afebrile.  Says her temp is 97.7 degrees blood pressure 120/79. Patient seen in her home in no acute distress but sounds a bit nasally congested.  Respiratory rate appears to be normal on the virtual interview.  No audible wheezing is noted.     Assessment  & Plan:  Acute upper respiratory infection  History of chronic kidney disease followed at [facility] and by [facility]  Hypertension  Type 2 diabetes mellitus followed by Endocrinology  Is also followed by [facility].  Plan: Patient likely has acute upper respiratory infection brought on either by exposure to dust or viral illness.  Due to her multiple medical issues, she will be treated with Zithromax Z-PAK 2 tabs day 1 followed by 1 tab days 2 through 5.  Also, she will take prednisone 10 mg tablets in tapering course for respiratory congestion starting with 6 tablets day 1 and decreasing by 10 mg daily i.e. 6-5-4-3-2-1 taper.  She will call if not improving in 48 hours or sooner if worse.  She will monitor her pulse oximetry at home.  Rest and drink fluids.  She will also need to monitor her Accu-Cheks while on prednisone.  Time spent reviewing medical records, interviewing patient, medical decision making, E scribing medication is 15 minutes.

## 2021-04-18 DIAGNOSIS — E781 Pure hyperglyceridemia: Secondary | ICD-10-CM | POA: Diagnosis not present

## 2021-04-18 DIAGNOSIS — Z5181 Encounter for therapeutic drug level monitoring: Secondary | ICD-10-CM | POA: Diagnosis not present

## 2021-04-21 DIAGNOSIS — Z7184 Encounter for health counseling related to travel: Secondary | ICD-10-CM | POA: Diagnosis not present

## 2021-04-21 DIAGNOSIS — Z20822 Contact with and (suspected) exposure to covid-19: Secondary | ICD-10-CM | POA: Diagnosis not present

## 2021-04-28 NOTE — Patient Instructions (Signed)
Need to repeat serum potassium March 28.  Watch diet in the meantime.  A Zithromax Z-PAK has been prescribed for trip to [place].  Continue close follow-up with nephrology in endocrinologist.  Needs to consider an exercise program.  Needs to lose some weight.

## 2021-06-11 DIAGNOSIS — M65331 Trigger finger, right middle finger: Secondary | ICD-10-CM | POA: Diagnosis not present

## 2021-06-19 ENCOUNTER — Telehealth: Payer: Medicare PPO | Admitting: Internal Medicine

## 2021-06-19 NOTE — Telephone Encounter (Signed)
LVM to CB to schedule appointment for Surgery clearance schedule for 07/12/2021, we need 30 minute appointment in the morning.

## 2021-06-19 NOTE — Telephone Encounter (Signed)
scheduled

## 2021-06-26 ENCOUNTER — Encounter: Payer: Self-pay | Admitting: Internal Medicine

## 2021-06-26 ENCOUNTER — Ambulatory Visit: Payer: Medicare PPO | Admitting: Internal Medicine

## 2021-06-26 ENCOUNTER — Other Ambulatory Visit: Payer: Self-pay

## 2021-06-26 VITALS — BP 130/80 | HR 82 | Temp 97.9°F | Ht 62.5 in | Wt 211.0 lb

## 2021-06-26 DIAGNOSIS — E785 Hyperlipidemia, unspecified: Secondary | ICD-10-CM | POA: Diagnosis not present

## 2021-06-26 DIAGNOSIS — E1169 Type 2 diabetes mellitus with other specified complication: Secondary | ICD-10-CM | POA: Diagnosis not present

## 2021-06-26 DIAGNOSIS — N184 Chronic kidney disease, stage 4 (severe): Secondary | ICD-10-CM | POA: Diagnosis not present

## 2021-06-26 DIAGNOSIS — E119 Type 2 diabetes mellitus without complications: Secondary | ICD-10-CM | POA: Diagnosis not present

## 2021-06-27 DIAGNOSIS — Z0289 Encounter for other administrative examinations: Secondary | ICD-10-CM

## 2021-06-27 LAB — BASIC METABOLIC PANEL
BUN/Creatinine Ratio: 29 (calc) — ABNORMAL HIGH (ref 6–22)
BUN: 68 mg/dL — ABNORMAL HIGH (ref 7–25)
CO2: 22 mmol/L (ref 20–32)
Calcium: 10.7 mg/dL — ABNORMAL HIGH (ref 8.6–10.4)
Chloride: 108 mmol/L (ref 98–110)
Creat: 2.35 mg/dL — ABNORMAL HIGH (ref 0.50–0.99)
Glucose, Bld: 95 mg/dL (ref 65–99)
Potassium: 5.1 mmol/L (ref 3.5–5.3)
Sodium: 141 mmol/L (ref 135–146)

## 2021-06-27 LAB — HEMOGLOBIN A1C
Hgb A1c MFr Bld: 6.8 % of total Hgb — ABNORMAL HIGH (ref <5.7)
Mean Plasma Glucose: 148 mg/dL
eAG (mmol/L): 8.2 mmol/L

## 2021-06-27 NOTE — Progress Notes (Signed)
   Subjective:    Patient ID: [name], female    DOB: 11/30/1951, 70 y.o.   MRN: [id number]   HPI 70 year old Female with chronic kidney disease plans to have trigger finger release by Dr. Latanya Maudlin in the near future and needs surgery clearance.She needs a form completed. This will  involve local anesthesia as I understand it.  She had annual Medicare wellness visit in March. Her K was high then on 3 separate occasions 5.5, 5.6 and 5.4. Had been 5.5 in March 2020.  She has Stage IV chronic kidney disease. Has been seen at [facility] in 2009 and considered for transplant but renal functions improved.  Dr. Chalmers Cater manages her Diabetes. Hgb AIC 6.8%.  .She has sleep apnea. She has a hx of gout seen at [facility].Uric acid normal March 2022.  Seen by [facility] in March 2022.  Mild hyperkalemia has been the norm for her.  Calcium elevated at 10.7.  Previously was 9.5.  BUN 68 increased from 51.  Creatinine increased from 2.18-2.35     Objective:   Physical Exam  Blood pressure 130/80 pulse 82 temperature 97.9 pulse oximetry 97% weight 211 pounds height 5 feet 2.5 inches BMI 37.98 neck is supple.  No carotid bruits.  Chest clear.  Cardiac exam regular rate and rhythm normal S1 and S2.  No lower extremity pitting edema.      Assessment & Plan:   I have okaying her trigger finger release under local anesthesia by Dr. Rhona Raider but I am notifying [facility] on some lab changes including creatinine 2.35 and in March was 2.18, BUN increased from 51-68.  Calcium 10.7 and previously was 9.5 3 months ago.  Addendum: We called patient with these results and asked for her to contact [facility].  These results were also faxed to that office today by my office.  When patient called, she was told she could not get an appointment until she got a note and labs from  [place] but we had  already faxed the labs.  I will notify her Nephrologist of this issue.  I think  it is okay for her to have trigger finger release.

## 2021-06-27 NOTE — Patient Instructions (Signed)
I will notify [facility] of changes in her lab results.  I have signed surgery clearance form for trigger finger releases I think this will be done under local anesthesia.

## 2021-06-27 NOTE — Telephone Encounter (Addendum)
Faxed Surgery Clearance Form signed, demographics, medication list, lab results, H&P to [place]: [name] [phone], phone [phone]

## 2021-07-04 DIAGNOSIS — N184 Chronic kidney disease, stage 4 (severe): Secondary | ICD-10-CM | POA: Diagnosis not present

## 2021-07-09 ENCOUNTER — Other Ambulatory Visit: Payer: Self-pay | Admitting: Nephrology

## 2021-07-09 DIAGNOSIS — N184 Chronic kidney disease, stage 4 (severe): Secondary | ICD-10-CM

## 2021-07-11 DIAGNOSIS — N184 Chronic kidney disease, stage 4 (severe): Secondary | ICD-10-CM | POA: Diagnosis not present

## 2021-07-11 DIAGNOSIS — N281 Cyst of kidney, acquired: Secondary | ICD-10-CM | POA: Diagnosis not present

## 2021-07-12 DIAGNOSIS — M65331 Trigger finger, right middle finger: Secondary | ICD-10-CM | POA: Diagnosis not present

## 2021-07-20 DIAGNOSIS — G4733 Obstructive sleep apnea (adult) (pediatric): Secondary | ICD-10-CM | POA: Diagnosis not present

## 2021-07-23 DIAGNOSIS — N184 Chronic kidney disease, stage 4 (severe): Secondary | ICD-10-CM | POA: Diagnosis not present

## 2021-08-01 ENCOUNTER — Other Ambulatory Visit: Payer: Self-pay

## 2021-08-01 ENCOUNTER — Encounter: Payer: Self-pay | Admitting: Podiatry

## 2021-08-01 ENCOUNTER — Ambulatory Visit: Payer: Medicare PPO | Admitting: Podiatry

## 2021-08-01 DIAGNOSIS — L84 Corns and callosities: Secondary | ICD-10-CM

## 2021-08-01 DIAGNOSIS — M79675 Pain in left toe(s): Secondary | ICD-10-CM

## 2021-08-01 DIAGNOSIS — E1122 Type 2 diabetes mellitus with diabetic chronic kidney disease: Secondary | ICD-10-CM | POA: Diagnosis not present

## 2021-08-01 DIAGNOSIS — E063 Autoimmune thyroiditis: Secondary | ICD-10-CM | POA: Insufficient documentation

## 2021-08-01 DIAGNOSIS — N184 Chronic kidney disease, stage 4 (severe): Secondary | ICD-10-CM | POA: Diagnosis not present

## 2021-08-01 DIAGNOSIS — M2550 Pain in unspecified joint: Secondary | ICD-10-CM | POA: Insufficient documentation

## 2021-08-01 DIAGNOSIS — M25641 Stiffness of right hand, not elsewhere classified: Secondary | ICD-10-CM | POA: Diagnosis not present

## 2021-08-01 DIAGNOSIS — M79674 Pain in right toe(s): Secondary | ICD-10-CM

## 2021-08-01 DIAGNOSIS — M65331 Trigger finger, right middle finger: Secondary | ICD-10-CM | POA: Diagnosis not present

## 2021-08-01 DIAGNOSIS — B351 Tinea unguium: Secondary | ICD-10-CM

## 2021-08-01 DIAGNOSIS — Z794 Long term (current) use of insulin: Secondary | ICD-10-CM | POA: Diagnosis not present

## 2021-08-01 NOTE — Progress Notes (Signed)
  Subjective:  Patient ID: [name], female    DOB: [birth year],  MRN: [id number]  [name] presents to clinic today for at risk foot care. Pt has h/o NIDDM with chronic kidney disease and callus(es) b/l halluces and painful thick toenails that are difficult to trim. Painful toenails interfere with ambulation. Aggravating factors include wearing enclosed shoe gear. Pain is relieved with periodic professional debridement. Painful calluses are aggravated when weightbearing with and without shoegear. Pain is relieved with periodic professional debridement.  Patient states blood glucose was 175 mg/dl today.  PCP is Baxley, Cresenciano Lick, MD , and last visit was 06/26/2021.  Allergies  Allergen Reactions   Fish Oil Shortness Of Breath    Other reaction(s): Unknown   Metformin     Other reaction(s): Other (See Comments), Unknown "Facial puffiness"  Pt stated her kidney specialist told her to add metformin to her drug allergies list.    Metformin And Related Other (See Comments)    KIDNEY FAILURE?   Fenofibrate     fever   Cephalexin Diarrhea and Nausea And Vomiting   Ciprocin-Fluocin-Procin [Fluocinolone]     Acute kidney failure   Ciprofloxacin Other (See Comments)    Kidney failure Other reaction(s): Unknown   Fenofibrate     Other reaction(s): fever   Insulins Other (See Comments)    Migraines, nausea Other reaction(s): Other (See Comments), Unknown Migraines, nausea NOVLOG    Latex     Other reaction(s): rash   Niacin     Other reaction(s): Unknown   Orange Fruit [Citrus]    Ramipril Cough   Ramipril     Other reaction(s): cough   Sulfa Antibiotics Rash    Review of Systems: Negative except as noted in the HPI. Objective:   Constitutional [name] is a pleasant 70 y.o. Caucasian female, in NAD. AAO x 3.   Vascular Capillary refill time to digits immediate b/l. Palpable pedal pulses b/l LE. Pedal hair sparse. Lower extremity skin  temperature gradient within normal limits. No cyanosis or clubbing noted.  Neurologic Normal speech. Oriented to person, place, and time. Protective sensation intact 5/5 intact bilaterally with 10g monofilament b/l. Vibratory sensation intact b/l.  Dermatologic Pedal skin with normal turgor, texture and tone b/l lower extremities. No open wounds b/l lower extremities. No interdigital macerations b/l lower extremities. Toenails 1-5 b/l elongated, discolored, dystrophic, thickened, crumbly with subungual debris and tenderness to dorsal palpation. Hyperkeratotic lesion(s) submet head 1 left foot.  No erythema, no edema, no drainage, no fluctuance.  Orthopedic: Normal muscle strength 5/5 to all lower extremity muscle groups bilaterally. Hallux valgus with bunion deformity noted b/l lower extremities.   Radiographs: None Assessment:   1. Pain due to onychomycosis of toenails of both feet   2. Callus   3. Type 2 diabetes mellitus with stage 4 chronic kidney disease, with long-term current use of insulin ([place])    Plan:  -Examined patient. -Continue diabetic foot care principles. -Patient to continue soft, supportive shoe gear daily. -Toenails 1-5 b/l were debrided in length and girth with sterile nail nippers and dremel without iatrogenic bleeding.  -Callus(es) submet head 1 left foot pared utilizing sterile scalpel blade without complication or incident. Total number debrided =1. -Patient to report any pedal injuries to medical professional immediately. -Patient/POA to call should there be question/concern in the interim.  Return in about 3 months (around 11/01/2021).  Marzetta Board, DPM

## 2021-08-02 DIAGNOSIS — N184 Chronic kidney disease, stage 4 (severe): Secondary | ICD-10-CM | POA: Diagnosis not present

## 2021-08-02 DIAGNOSIS — E785 Hyperlipidemia, unspecified: Secondary | ICD-10-CM | POA: Diagnosis not present

## 2021-08-02 DIAGNOSIS — N179 Acute kidney failure, unspecified: Secondary | ICD-10-CM | POA: Diagnosis not present

## 2021-08-02 DIAGNOSIS — N2589 Other disorders resulting from impaired renal tubular function: Secondary | ICD-10-CM | POA: Diagnosis not present

## 2021-08-02 DIAGNOSIS — I129 Hypertensive chronic kidney disease with stage 1 through stage 4 chronic kidney disease, or unspecified chronic kidney disease: Secondary | ICD-10-CM | POA: Diagnosis not present

## 2021-08-02 DIAGNOSIS — N2581 Secondary hyperparathyroidism of renal origin: Secondary | ICD-10-CM | POA: Diagnosis not present

## 2021-08-02 DIAGNOSIS — Z8616 Personal history of COVID-19: Secondary | ICD-10-CM | POA: Diagnosis not present

## 2021-08-02 DIAGNOSIS — E1122 Type 2 diabetes mellitus with diabetic chronic kidney disease: Secondary | ICD-10-CM | POA: Diagnosis not present

## 2021-08-02 DIAGNOSIS — E875 Hyperkalemia: Secondary | ICD-10-CM | POA: Diagnosis not present

## 2021-08-06 DIAGNOSIS — M65331 Trigger finger, right middle finger: Secondary | ICD-10-CM | POA: Diagnosis not present

## 2021-08-06 DIAGNOSIS — M25641 Stiffness of right hand, not elsewhere classified: Secondary | ICD-10-CM | POA: Diagnosis not present

## 2021-08-08 DIAGNOSIS — M65331 Trigger finger, right middle finger: Secondary | ICD-10-CM | POA: Diagnosis not present

## 2021-08-08 DIAGNOSIS — M25641 Stiffness of right hand, not elsewhere classified: Secondary | ICD-10-CM | POA: Diagnosis not present

## 2021-08-13 DIAGNOSIS — M65331 Trigger finger, right middle finger: Secondary | ICD-10-CM | POA: Diagnosis not present

## 2021-08-13 DIAGNOSIS — M25641 Stiffness of right hand, not elsewhere classified: Secondary | ICD-10-CM | POA: Diagnosis not present

## 2021-08-16 DIAGNOSIS — M25641 Stiffness of right hand, not elsewhere classified: Secondary | ICD-10-CM | POA: Diagnosis not present

## 2021-08-16 DIAGNOSIS — M65331 Trigger finger, right middle finger: Secondary | ICD-10-CM | POA: Diagnosis not present

## 2021-08-17 DIAGNOSIS — E1122 Type 2 diabetes mellitus with diabetic chronic kidney disease: Secondary | ICD-10-CM | POA: Diagnosis not present

## 2021-08-17 DIAGNOSIS — N184 Chronic kidney disease, stage 4 (severe): Secondary | ICD-10-CM | POA: Diagnosis not present

## 2021-08-17 DIAGNOSIS — Z794 Long term (current) use of insulin: Secondary | ICD-10-CM | POA: Diagnosis not present

## 2021-08-17 DIAGNOSIS — E119 Type 2 diabetes mellitus without complications: Secondary | ICD-10-CM | POA: Diagnosis not present

## 2021-08-17 DIAGNOSIS — Z7682 Awaiting organ transplant status: Secondary | ICD-10-CM | POA: Diagnosis not present

## 2021-08-17 DIAGNOSIS — E1121 Type 2 diabetes mellitus with diabetic nephropathy: Secondary | ICD-10-CM | POA: Diagnosis not present

## 2021-08-17 DIAGNOSIS — Z01810 Encounter for preprocedural cardiovascular examination: Secondary | ICD-10-CM | POA: Diagnosis not present

## 2021-08-17 DIAGNOSIS — I517 Cardiomegaly: Secondary | ICD-10-CM | POA: Diagnosis not present

## 2021-08-20 DIAGNOSIS — M65331 Trigger finger, right middle finger: Secondary | ICD-10-CM | POA: Diagnosis not present

## 2021-08-20 DIAGNOSIS — M25641 Stiffness of right hand, not elsewhere classified: Secondary | ICD-10-CM | POA: Diagnosis not present

## 2021-08-21 ENCOUNTER — Telehealth: Payer: Medicare PPO | Admitting: Internal Medicine

## 2021-08-21 ENCOUNTER — Telehealth: Payer: Self-pay | Admitting: Internal Medicine

## 2021-08-21 ENCOUNTER — Encounter: Payer: Self-pay | Admitting: Internal Medicine

## 2021-08-21 ENCOUNTER — Other Ambulatory Visit: Payer: Self-pay

## 2021-08-21 VITALS — BP 135/88 | Temp 98.5°F

## 2021-08-21 DIAGNOSIS — E1169 Type 2 diabetes mellitus with other specified complication: Secondary | ICD-10-CM

## 2021-08-21 DIAGNOSIS — E785 Hyperlipidemia, unspecified: Secondary | ICD-10-CM

## 2021-08-21 DIAGNOSIS — N184 Chronic kidney disease, stage 4 (severe): Secondary | ICD-10-CM | POA: Diagnosis not present

## 2021-08-21 DIAGNOSIS — E119 Type 2 diabetes mellitus without complications: Secondary | ICD-10-CM

## 2021-08-21 DIAGNOSIS — Z8739 Personal history of other diseases of the musculoskeletal system and connective tissue: Secondary | ICD-10-CM

## 2021-08-21 DIAGNOSIS — Z6837 Body mass index (BMI) 37.0-37.9, adult: Secondary | ICD-10-CM | POA: Diagnosis not present

## 2021-08-21 DIAGNOSIS — U071 COVID-19: Secondary | ICD-10-CM | POA: Diagnosis not present

## 2021-08-21 DIAGNOSIS — I10 Essential (primary) hypertension: Secondary | ICD-10-CM | POA: Diagnosis not present

## 2021-08-21 MED ORDER — AZITHROMYCIN 250 MG PO TABS: ORAL_TABLET | ORAL | 0 refills | Status: AC

## 2021-08-21 NOTE — Patient Instructions (Signed)
Take Zithromax Z-PAK 2 tabs day 1 followed by 1 tab days 2 through 5.  Monitor pulse oximetry.  Walk some to prevent atelectasis.  Monitor symptoms including temperature and blood pressure.  Make sure you are well-hydrated.  Call if symptoms worsen or do not improve.

## 2021-08-21 NOTE — Telephone Encounter (Signed)
scheduled

## 2021-08-21 NOTE — Progress Notes (Signed)
   Subjective:    Patient ID: [name], female    DOB: [birth year], 70 y.o.   MRN: [id number]  HPI 70 year old Female with stage IV chronic kidney disease, hypertension, GE reflux, dependent edema, obstructive sleep apnea, hypertriglyceridemia, obesity, history of gout, osteoporosis, history of melanoma left trunk removed by Dermatologist in 2020, allergic rhinitis, dependent edema, history of acute renal failure July 2009 without obstruction seen today via interactive audio and video telecommunications due to the Coronavirus pandemic.  She is identified using 2 identifiers as [name], a longstanding patient in this practice.  She is at her home and I am at my office.  She is agreeable to visit in this format today.  Patient states that she had onset of coryza and rhinorrhea on Thursday, August 18.  Says that she has only been to [employer] and to her local gym.  Husband has tested positive for COVID-19 recently.  Neither she nor her husband have been wearing their masks in public recently.  History of incisional ventral hernia repair December 2011 by Dr. Dossie Der at [facility].  History of tubular adenoma of the colon.  Patient had COVID-19 in January 2022.  She only had mild symptoms.  Patient has had a total of 3 COVID-19 immunizations the last one being November 2021 according to our records.  Review of Systems denies nausea, vomiting, diarrhea, shaking chills, dysgeusia.  Has had scratchy throat.  Has some cough with sputum production that is not dark yellow.     Objective:   Physical Exam Blood pressure 135/88 temperature 98.5 degrees patient is seen virtually in her home.  She appears to be pale but in no acute distress.  Does not appear to be tachypneic.  Not heard to be coughing on this visit.       Assessment & Plan:  Acute COVID-19 virus infection based on home testing.  Acute lower respiratory infection-patient advised to purchase home pulse oximeter and monitor oxygen  levels.  Currently does not appear to be in tachypnea.  Type 2 diabetes mellitus-followed by Endocrinologist  History of stage IV chronic kidney disease-has been evaluated at [facility] transplant clinic  Hypertension-stable on current regimen  Obesity-has been going to the gym recently  GE reflux  History of osteoporosis  BMI 37.98, weight 211 pounds as of June 26, 2021  History of COVID-19 in January 2022  Plan: We talked about various options including Paxlovid and renal failure dosing but patient prefers just to try Zithromax Z-PAK at this point in time.  This has been called in for her to take 2 tablets day 1 followed by 1 tablet days 2 through 5.  She did well with this previously when she had COVID-19 in January 2022.  She is to stay well-hydrated.  She is to walk some for exercise and to prevent atelectasis.  She is to monitor pulse oximetry and let me know if less than 94%.  Call if symptoms worsen.

## 2021-08-21 NOTE — Telephone Encounter (Signed)
[name] [phone]  [name] called to say she has just tested positive for COVID, she started with runny eyes and runny nose on Thursday of last week. Has tested before, but just tested positive today, very congested in her nose. Had 2 vaccines, 1 booster

## 2021-08-23 ENCOUNTER — Telehealth: Payer: Self-pay | Admitting: Internal Medicine

## 2021-08-23 NOTE — Telephone Encounter (Signed)
[place] results to [facility] [phone], phone [phone]  Positive 8.23.22

## 2021-09-04 ENCOUNTER — Telehealth: Payer: Self-pay | Admitting: Internal Medicine

## 2021-09-04 ENCOUNTER — Encounter: Payer: Self-pay | Admitting: Internal Medicine

## 2021-09-04 ENCOUNTER — Other Ambulatory Visit: Payer: Self-pay

## 2021-09-04 ENCOUNTER — Ambulatory Visit: Payer: Medicare PPO | Admitting: Internal Medicine

## 2021-09-04 VITALS — BP 144/76 | HR 96 | Ht 62.5 in | Wt 202.0 lb

## 2021-09-04 DIAGNOSIS — U071 COVID-19: Secondary | ICD-10-CM

## 2021-09-04 DIAGNOSIS — J329 Chronic sinusitis, unspecified: Secondary | ICD-10-CM

## 2021-09-04 MED ORDER — AZITHROMYCIN 250 MG PO TABS: ORAL_TABLET | ORAL | 1 refills | Status: AC

## 2021-09-04 MED ORDER — METHYLPREDNISOLONE ACETATE 80 MG/ML IJ SUSP
80.0000 mg | Freq: Once | INTRAMUSCULAR | Status: AC
  Administered 2021-09-04: 80 mg via INTRAMUSCULAR

## 2021-09-04 NOTE — Telephone Encounter (Signed)
Pt called and said she had covid a few weeks ago and said that she was on a zpak and said that she is still having sinus issues and wasn't sure what she should do. She is taking a flight next week and is concerned. Please advise. Best call back [phone]

## 2021-09-04 NOTE — Patient Instructions (Addendum)
Take Zithromax Z pak as directed  6-5-4-3-2-1 taper. Depomedrol 80 mg IM given in office. Have one refill to take on trip. Monitor accuchecks closely. May take flu vaccine in October. Wait 2 months to take Covid booster.

## 2021-09-04 NOTE — Progress Notes (Signed)
   Subjective:    Patient ID: [name], female    DOB: August 15, 1951, 70 y.o.   MRN: [id number]  HPI 70 year old Female seen on August 23 via virtual visit with acute COVID-19 virus infection.  She has a history of stage IV chronic kidney disease, hypertension, GE reflux, dependent edema, obstructive sleep apnea, hypertriglyceridemia, obesity, history of gout, osteoporosis, allergic rhinitis, dependent edema and history of acute renal failure July 2009 without obstruction.  Previously had COVID-19 in January 2022.  We talked about Paxlovid with renal failure dosing but patient preferred Zithromax Z-PAK and that is what was prescribed at the time.  Now has persistent cough.  Denies fever or chills, discolored sputum, nausea, vomiting  Has maxillary sinus pressure    Review of Systems see above     Objective:   Physical Exam Blood pressure 144/76 pulse 96 pulse oximetry 98% weight 202 pounds BMI 36.36  Pharynx is clear.  TMs clear.  She is not hoarse.  Has maxillary sinus tenderness to palpation.  Neck is supple.  No cervical adenopathy.  Chest clear.       Assessment & Plan:  Recent COVID-19 virus infection  Acute maxillary sinusitis  Insulin-dependent diabetes mellitus  Plan: Despite being diabetic I think she could benefit from methylprednisolone 80 mg IM so this was given.  She will continue to monitor her Accu-Cheks.  Gave her new prescription for Zithromax Z-PAK 2 tabs day 1 followed by 1 tab days 2 through 5.  Call if not better in 10 days or sooner if worse.  1 refill given for her to take all upcoming trip.  Have flu vaccine in October and wait 2 months to take COVID booster.

## 2021-09-06 DIAGNOSIS — M25641 Stiffness of right hand, not elsewhere classified: Secondary | ICD-10-CM | POA: Diagnosis not present

## 2021-09-06 DIAGNOSIS — M65331 Trigger finger, right middle finger: Secondary | ICD-10-CM | POA: Diagnosis not present

## 2021-09-17 DIAGNOSIS — I10 Essential (primary) hypertension: Secondary | ICD-10-CM | POA: Diagnosis not present

## 2021-09-17 DIAGNOSIS — E781 Pure hyperglyceridemia: Secondary | ICD-10-CM | POA: Diagnosis not present

## 2021-09-17 DIAGNOSIS — R0609 Other forms of dyspnea: Secondary | ICD-10-CM | POA: Diagnosis not present

## 2021-09-17 DIAGNOSIS — R9439 Abnormal result of other cardiovascular function study: Secondary | ICD-10-CM | POA: Diagnosis not present

## 2021-09-24 ENCOUNTER — Encounter: Payer: Self-pay | Admitting: Internal Medicine

## 2021-09-24 DIAGNOSIS — Z961 Presence of intraocular lens: Secondary | ICD-10-CM | POA: Diagnosis not present

## 2021-09-24 DIAGNOSIS — H524 Presbyopia: Secondary | ICD-10-CM | POA: Diagnosis not present

## 2021-09-24 DIAGNOSIS — E119 Type 2 diabetes mellitus without complications: Secondary | ICD-10-CM | POA: Diagnosis not present

## 2021-09-24 LAB — HM DIABETES EYE EXAM

## 2021-09-25 DIAGNOSIS — L814 Other melanin hyperpigmentation: Secondary | ICD-10-CM | POA: Diagnosis not present

## 2021-09-25 DIAGNOSIS — L821 Other seborrheic keratosis: Secondary | ICD-10-CM | POA: Diagnosis not present

## 2021-09-25 DIAGNOSIS — D1722 Benign lipomatous neoplasm of skin and subcutaneous tissue of left arm: Secondary | ICD-10-CM | POA: Diagnosis not present

## 2021-09-25 DIAGNOSIS — L700 Acne vulgaris: Secondary | ICD-10-CM | POA: Diagnosis not present

## 2021-09-25 DIAGNOSIS — Z85820 Personal history of malignant melanoma of skin: Secondary | ICD-10-CM | POA: Diagnosis not present

## 2021-09-25 DIAGNOSIS — L578 Other skin changes due to chronic exposure to nonionizing radiation: Secondary | ICD-10-CM | POA: Diagnosis not present

## 2021-09-25 DIAGNOSIS — Z23 Encounter for immunization: Secondary | ICD-10-CM | POA: Diagnosis not present

## 2021-09-25 DIAGNOSIS — Z808 Family history of malignant neoplasm of other organs or systems: Secondary | ICD-10-CM | POA: Diagnosis not present

## 2021-09-25 DIAGNOSIS — D225 Melanocytic nevi of trunk: Secondary | ICD-10-CM | POA: Diagnosis not present

## 2021-09-27 DIAGNOSIS — N184 Chronic kidney disease, stage 4 (severe): Secondary | ICD-10-CM | POA: Diagnosis not present

## 2021-09-27 DIAGNOSIS — E1165 Type 2 diabetes mellitus with hyperglycemia: Secondary | ICD-10-CM | POA: Diagnosis not present

## 2021-09-27 DIAGNOSIS — E7800 Pure hypercholesterolemia, unspecified: Secondary | ICD-10-CM | POA: Diagnosis not present

## 2021-09-27 DIAGNOSIS — M25641 Stiffness of right hand, not elsewhere classified: Secondary | ICD-10-CM | POA: Diagnosis not present

## 2021-09-27 DIAGNOSIS — M65331 Trigger finger, right middle finger: Secondary | ICD-10-CM | POA: Diagnosis not present

## 2021-09-27 DIAGNOSIS — I10 Essential (primary) hypertension: Secondary | ICD-10-CM | POA: Diagnosis not present

## 2021-10-05 ENCOUNTER — Other Ambulatory Visit: Payer: Self-pay

## 2021-10-05 ENCOUNTER — Observation Stay (HOSPITAL_BASED_OUTPATIENT_CLINIC_OR_DEPARTMENT_OTHER)
Admission: EM | Admit: 2021-10-05 | Discharge: 2021-10-06 | Disposition: A | Discharge status: Home / Self Care | Payer: Medicare PPO | Attending: Internal Medicine | Admitting: Internal Medicine

## 2021-10-05 ENCOUNTER — Encounter (HOSPITAL_BASED_OUTPATIENT_CLINIC_OR_DEPARTMENT_OTHER): Payer: Self-pay | Admitting: *Deleted

## 2021-10-05 ENCOUNTER — Emergency Department (HOSPITAL_BASED_OUTPATIENT_CLINIC_OR_DEPARTMENT_OTHER): Payer: Medicare PPO

## 2021-10-05 DIAGNOSIS — I517 Cardiomegaly: Secondary | ICD-10-CM | POA: Diagnosis not present

## 2021-10-05 DIAGNOSIS — I129 Hypertensive chronic kidney disease with stage 1 through stage 4 chronic kidney disease, or unspecified chronic kidney disease: Secondary | ICD-10-CM | POA: Insufficient documentation

## 2021-10-05 DIAGNOSIS — Z79899 Other long term (current) drug therapy: Secondary | ICD-10-CM | POA: Diagnosis not present

## 2021-10-05 DIAGNOSIS — G4733 Obstructive sleep apnea (adult) (pediatric): Secondary | ICD-10-CM | POA: Diagnosis present

## 2021-10-05 DIAGNOSIS — Z7982 Long term (current) use of aspirin: Secondary | ICD-10-CM | POA: Diagnosis not present

## 2021-10-05 DIAGNOSIS — Z91040 Latex allergy status: Secondary | ICD-10-CM | POA: Diagnosis not present

## 2021-10-05 DIAGNOSIS — E1169 Type 2 diabetes mellitus with other specified complication: Secondary | ICD-10-CM | POA: Diagnosis present

## 2021-10-05 DIAGNOSIS — N184 Chronic kidney disease, stage 4 (severe): Secondary | ICD-10-CM | POA: Diagnosis not present

## 2021-10-05 DIAGNOSIS — N179 Acute kidney failure, unspecified: Secondary | ICD-10-CM

## 2021-10-05 DIAGNOSIS — R079 Chest pain, unspecified: Secondary | ICD-10-CM | POA: Diagnosis not present

## 2021-10-05 DIAGNOSIS — Z20822 Contact with and (suspected) exposure to covid-19: Secondary | ICD-10-CM | POA: Diagnosis not present

## 2021-10-05 DIAGNOSIS — R0602 Shortness of breath: Secondary | ICD-10-CM | POA: Diagnosis present

## 2021-10-05 DIAGNOSIS — Z794 Long term (current) use of insulin: Secondary | ICD-10-CM | POA: Diagnosis not present

## 2021-10-05 DIAGNOSIS — I10 Essential (primary) hypertension: Secondary | ICD-10-CM | POA: Diagnosis present

## 2021-10-05 DIAGNOSIS — I471 Supraventricular tachycardia, unspecified: Secondary | ICD-10-CM | POA: Diagnosis present

## 2021-10-05 DIAGNOSIS — I214 Non-ST elevation (NSTEMI) myocardial infarction: Secondary | ICD-10-CM

## 2021-10-05 DIAGNOSIS — R000 Tachycardia, unspecified: Secondary | ICD-10-CM | POA: Diagnosis not present

## 2021-10-05 DIAGNOSIS — E1122 Type 2 diabetes mellitus with diabetic chronic kidney disease: Secondary | ICD-10-CM | POA: Diagnosis not present

## 2021-10-05 LAB — CBC WITH DIFFERENTIAL/PLATELET
Abs Immature Granulocytes: 0.06 10*3/uL (ref 0.00–0.07)
Basophils Absolute: 0.1 10*3/uL (ref 0.0–0.1)
Basophils Relative: 1 %
Eosinophils Absolute: 0.3 10*3/uL (ref 0.0–0.5)
Eosinophils Relative: 2 %
HCT: 43.9 % (ref 36.0–46.0)
Hemoglobin: 14.1 g/dL (ref 12.0–15.0)
Immature Granulocytes: 1 %
Lymphocytes Relative: 16 %
Lymphs Abs: 1.7 10*3/uL (ref 0.7–4.0)
MCH: 31.1 pg (ref 26.0–34.0)
MCHC: 32.1 g/dL (ref 30.0–36.0)
MCV: 96.7 fL (ref 80.0–100.0)
Monocytes Absolute: 1 10*3/uL (ref 0.1–1.0)
Monocytes Relative: 9 %
Neutro Abs: 7.6 10*3/uL (ref 1.7–7.7)
Neutrophils Relative %: 71 %
Platelets: 326 10*3/uL (ref 150–400)
RBC: 4.54 MIL/uL (ref 3.87–5.11)
RDW: 13.9 % (ref 11.5–15.5)
WBC: 10.7 10*3/uL — ABNORMAL HIGH (ref 4.0–10.5)
nRBC: 0 % (ref 0.0–0.2)

## 2021-10-05 LAB — BASIC METABOLIC PANEL
Anion gap: 11 (ref 5–15)
BUN: 55 mg/dL — ABNORMAL HIGH (ref 8–23)
CO2: 19 mmol/L — ABNORMAL LOW (ref 22–32)
Calcium: 9.8 mg/dL (ref 8.9–10.3)
Chloride: 110 mmol/L (ref 98–111)
Creatinine, Ser: 2.53 mg/dL — ABNORMAL HIGH (ref 0.44–1.00)
GFR, Estimated: 20 mL/min — ABNORMAL LOW (ref >60)
Glucose, Bld: 222 mg/dL — ABNORMAL HIGH (ref 70–99)
Potassium: 5.4 mmol/L — ABNORMAL HIGH (ref 3.5–5.1)
Sodium: 140 mmol/L (ref 135–145)

## 2021-10-05 LAB — PROTIME-INR
INR: 1 (ref 0.8–1.2)
Prothrombin Time: 12.8 seconds (ref 11.4–15.2)

## 2021-10-05 LAB — RESP PANEL BY RT-PCR (FLU A&B, COVID) ARPGX2
Influenza A by PCR: NEGATIVE
Influenza B by PCR: NEGATIVE
SARS Coronavirus 2 by RT PCR: NEGATIVE

## 2021-10-05 LAB — HEPARIN LEVEL (UNFRACTIONATED): Heparin Unfractionated: <0.1 IU/mL — ABNORMAL LOW (ref 0.30–0.70)

## 2021-10-05 LAB — TROPONIN I (HIGH SENSITIVITY)
Troponin I (High Sensitivity): 850 ng/L (ref <18)
Troponin I (High Sensitivity): 4520 ng/L (ref <18)

## 2021-10-05 LAB — MAGNESIUM: Magnesium: 2 mg/dL (ref 1.7–2.4)

## 2021-10-05 LAB — APTT: aPTT: 25 seconds (ref 24–36)

## 2021-10-05 LAB — GLUCOSE, CAPILLARY: Glucose-Capillary: 148 mg/dL — ABNORMAL HIGH (ref 70–99)

## 2021-10-05 IMAGING — DX DG CHEST 1V PORT
1 series · 1 of 1 positions shown · non-contrast
Comparison: [DATE]

CLINICAL DATA: Chest pain tachycardia

EXAM:
PORTABLE CHEST 1 VIEW

[chest]
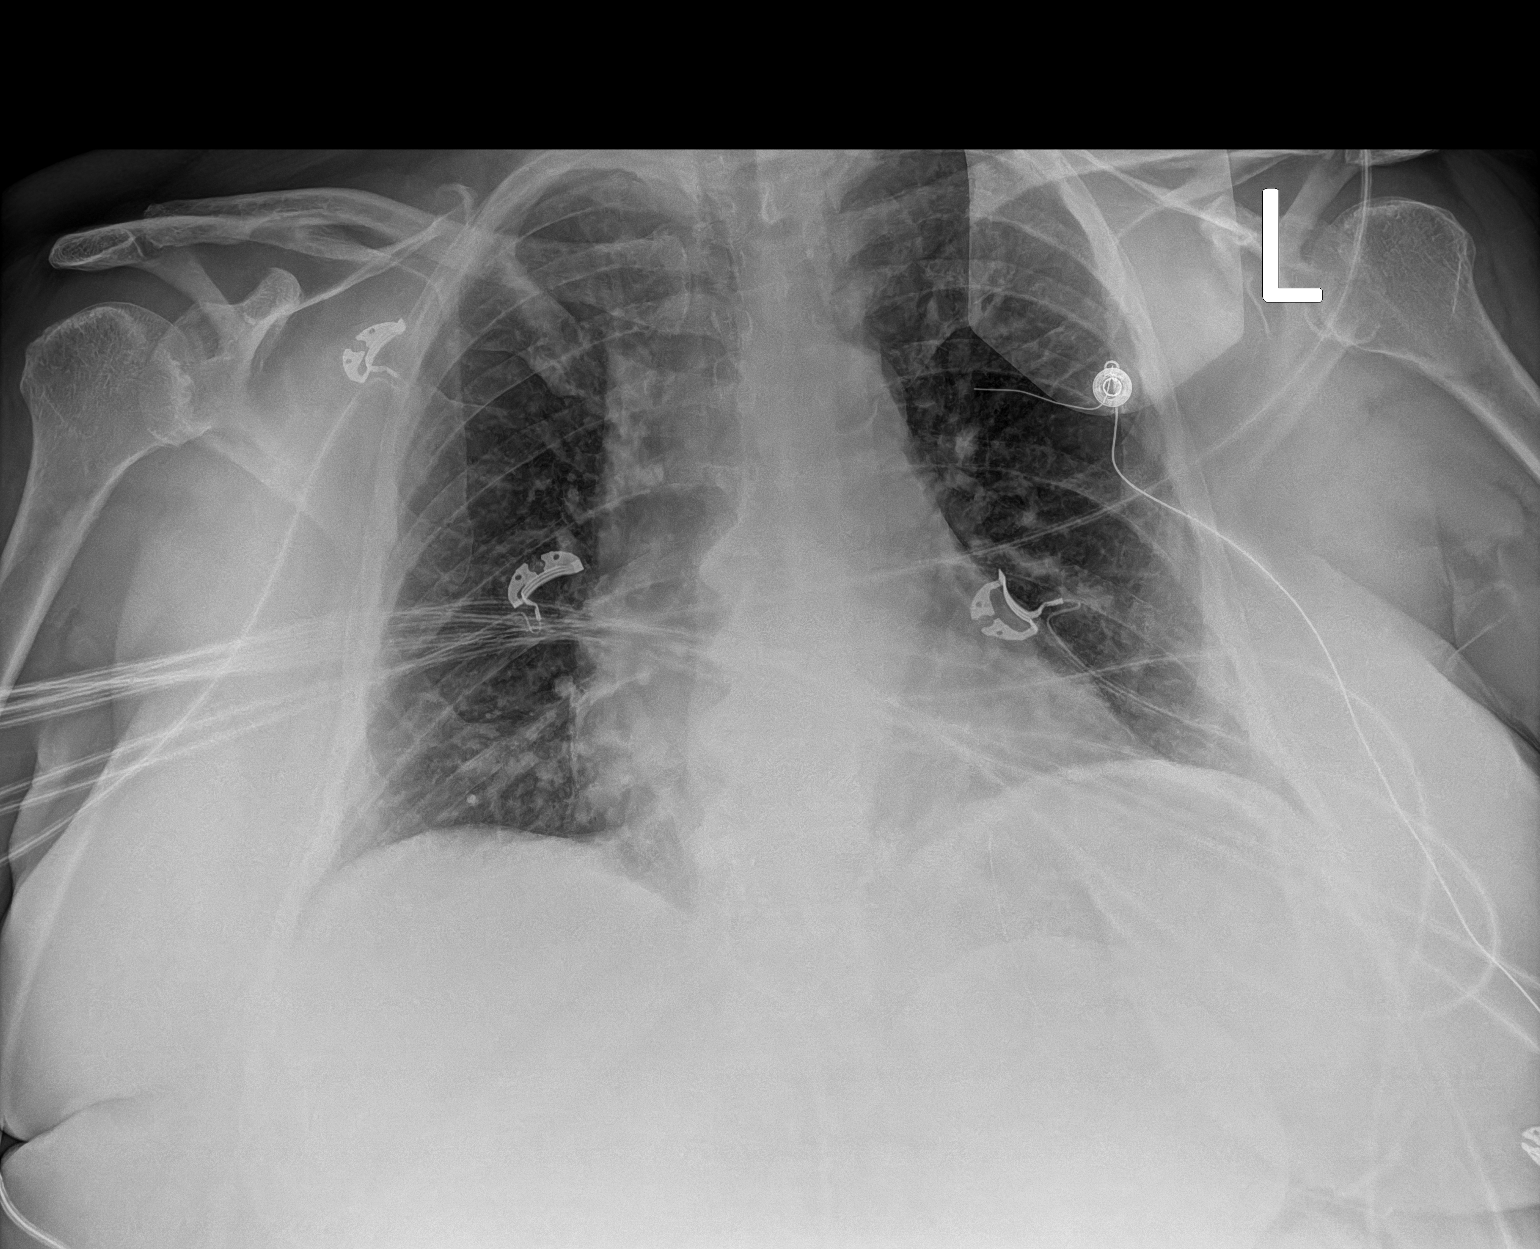

[1 of 1 positions shown; findings below may reference images not displayed]

FINDINGS: No focal opacity or pleural effusion. Borderline cardiomegaly with
aortic atherosclerosis. No pneumothorax.
IMPRESSION: Borderline to mild cardiomegaly.

## 2021-10-05 MED ORDER — HEPARIN BOLUS VIA INFUSION
4000.0000 [IU] | Freq: Once | INTRAVENOUS | Status: AC
  Administered 2021-10-05: 4000 [IU] via INTRAVENOUS

## 2021-10-05 MED ORDER — HEPARIN (PORCINE) 25000 UT/250ML-% IV SOLN: 950.0000 [IU]/h | INTRAVENOUS | Status: DC

## 2021-10-05 MED ORDER — ATORVASTATIN CALCIUM 40 MG PO TABS
40.0000 mg | ORAL_TABLET | Freq: Every day | ORAL | Status: DC
  Administered 2021-10-05: 40 mg via ORAL
  Filled 2021-10-05 (×2): qty 1

## 2021-10-05 MED ORDER — ADENOSINE 6 MG/2ML IV SOLN
INTRAVENOUS | Status: AC
  Administered 2021-10-05: 6 mg via INTRAVENOUS
  Filled 2021-10-05: qty 8

## 2021-10-05 MED ORDER — SODIUM CHLORIDE 0.9 % IV SOLN: Freq: Once | INTRAVENOUS | Status: AC

## 2021-10-05 MED ORDER — CARVEDILOL 12.5 MG PO TABS: 25.0000 mg | ORAL_TABLET | Freq: Two times a day (BID) | ORAL | Status: DC

## 2021-10-05 MED ORDER — HEPARIN BOLUS VIA INFUSION: 4000.0000 [IU] | Freq: Once | INTRAVENOUS | Status: DC

## 2021-10-05 MED ORDER — SODIUM CHLORIDE 0.9 % IV BOLUS
500.0000 mL | Freq: Once | INTRAVENOUS | Status: AC
  Administered 2021-10-05: 500 mL via INTRAVENOUS

## 2021-10-05 MED ORDER — ASPIRIN 81 MG PO CHEW
324.0000 mg | CHEWABLE_TABLET | Freq: Once | ORAL | Status: AC
  Administered 2021-10-05: 162 mg via ORAL
  Filled 2021-10-05: qty 4

## 2021-10-05 MED ORDER — INSULIN NPH (HUMAN) (ISOPHANE) 100 UNIT/ML ~~LOC~~ SUSP
20.0000 [IU] | Freq: Two times a day (BID) | SUBCUTANEOUS | Status: DC
  Administered 2021-10-06: 20 [IU] via SUBCUTANEOUS
  Filled 2021-10-05: qty 10

## 2021-10-05 MED ORDER — ADENOSINE 6 MG/2ML IV SOLN: 6.0000 mg | Freq: Once | INTRAVENOUS | Status: AC

## 2021-10-05 MED ORDER — METOPROLOL TARTRATE 25 MG PO TABS
50.0000 mg | ORAL_TABLET | Freq: Once | ORAL | Status: AC
  Administered 2021-10-05: 50 mg via ORAL
  Filled 2021-10-05: qty 2

## 2021-10-05 MED ORDER — ADENOSINE 6 MG/2ML IV SOLN
INTRAVENOUS | Status: AC
  Filled 2021-10-05: qty 4

## 2021-10-05 MED ORDER — CARVEDILOL 25 MG PO TABS
25.0000 mg | ORAL_TABLET | Freq: Two times a day (BID) | ORAL | Status: DC
  Administered 2021-10-06 (×2): 25 mg via ORAL
  Filled 2021-10-05 (×2): qty 1

## 2021-10-05 MED ORDER — HEPARIN (PORCINE) 25000 UT/250ML-% IV SOLN
1000.0000 [IU]/h | INTRAVENOUS | Status: DC
  Administered 2021-10-05: 850 [IU]/h via INTRAVENOUS
  Filled 2021-10-05: qty 250

## 2021-10-05 NOTE — ED Triage Notes (Signed)
Started sweating at 3pm and feeling and started having a chest pain.  No nausea or vomiting.

## 2021-10-05 NOTE — Progress Notes (Signed)
Patient came into the er with a heart arte 190. She was placed on 2lpm [place]. RT will continue to monitor

## 2021-10-05 NOTE — Progress Notes (Signed)
ANTICOAGULATION CONSULT NOTE - Initial Consult  Pharmacy Consult for Heparin Indication: chest pain/ACS  Allergies  Allergen Reactions   Fish Oil Shortness Of Breath    Other reaction(s): Unknown   Metformin     Other reaction(s): Other (See Comments), Unknown "Facial puffiness"  Pt stated her kidney specialist told her to add metformin to her drug allergies list.    Metformin And Related Other (See Comments)    KIDNEY FAILURE?   Fenofibrate     fever   Cephalexin Diarrhea and Nausea And Vomiting   Ciprocin-Fluocin-Procin [Fluocinolone]     Acute kidney failure   Ciprofloxacin Other (See Comments)    Kidney failure Other reaction(s): Unknown   Fenofibrate     Other reaction(s): fever   Insulins Other (See Comments)    Migraines, nausea Other reaction(s): Other (See Comments), Unknown Migraines, nausea NOVLOG    Latex     Other reaction(s): rash   Niacin     Other reaction(s): Unknown   Orange Fruit [Citrus]    Ramipril Cough   Ramipril     Other reaction(s): cough   Sulfa Antibiotics Rash    Patient Measurements: Height: 5' 2.5" (158.8 cm) Weight: 91.6 kg (201 lb 15.1 oz) IBW/kg (Calculated) : 51.25 Heparin Dosing Weight: 72.3 kg  Vital Signs: Temp: 98.3 F (36.8 C) (10/07 1901) Temp Source: Oral (10/07 1901) BP: 128/85 (10/07 1830) Pulse Rate: 99 (10/07 1830)  Labs: Recent Labs    10/05/21 1640 10/05/21 1730  HGB 14.1  --   HCT 43.9  --   PLT 326  --   LABPROT  --  12.8  INR  --  1.0  CREATININE 2.53*  --   TROPONINIHS  --  850*    Estimated Creatinine Clearance: 22.6 mL/min (A) (by C-G formula based on SCr of 2.53 mg/dL (H)).   Medical History: Past Medical History:  Diagnosis Date   Allergic rhinitis    Carbuncle, trunk    Carpal tunnel syndrome    Chronic kidney disease    Combined hyperlipidemia associated with type 2 diabetes mellitus (HCC)    Complication of anesthesia    Cyclical vomiting    Dependent edema    Diabetes  mellitus type II    Diabetic autonomic neuropathy (HCC)    Diabetic peripheral neuropathy associated with type 2 diabetes mellitus (HCC)    Dyspepsia    Essential hypertension, benign    GERD (gastroesophageal reflux disease)    Goiter    Hypercholesteremia    Menopause    Obesity    OSA (obstructive sleep apnea) 09/23/2016   Osteopenia    PONV (postoperative nausea and vomiting)    Tachycardia    Type II or unspecified type diabetes mellitus without mention of complication, not stated as uncontrolled     Medications:  (Not in a hospital admission)  Scheduled:   adenosine       aspirin  324 mg Oral Once   Infusions:   heparin     PRN:   Assessment: 23 yof with a history of CKD, HLD, HTN. Patient is presenting with chest pain. Heparin per pharmacy consult placed for chest pain/ACS.  Patient is on not on anticoagulation prior to arrival.  Hgb14.1;plt 326  Goal of Therapy:  Heparin level 0.3-0.7 units/ml Monitor platelets by anticoagulation protocol: Yes   Plan:  Give 4000 units bolus x 1 Start heparin infusion at 850 units/hr Check anti-Xa level in 6-8 hours and daily while on heparin Continue to monitor  H&H and platelets  Lorelei Pont, PharmD, BCPS 10/05/2021 7:07 PM ED Clinical Pharmacist -  [phone]

## 2021-10-05 NOTE — ED Notes (Signed)
Called [facility] to transport patient to [place] room 13

## 2021-10-05 NOTE — ED Notes (Signed)
States has no chest pain

## 2021-10-05 NOTE — ED Notes (Signed)
Handoff report given to [facility] 

## 2021-10-05 NOTE — ED Notes (Signed)
Handoff report given to Amour RN on 6E at [facility]

## 2021-10-05 NOTE — ED Provider Notes (Signed)
[facility] EMERGENCY DEPT Provider Note   CSN: [id number] Arrival date & time: 10/05/21  1613     History Chief Complaint  Patient presents with   Chest Pain    [name] is a 70 y.o. female.   Chest Pain Associated symptoms: diaphoresis and shortness of breath   Associated symptoms: no abdominal pain, no back pain, no cough, no dizziness, no fever, no headache, no nausea, no numbness, no palpitations, no vomiting and no weakness   Patient presents for left-sided chest pain and shortness of breath.  Onset was approximately 1 hour prior to arrival.  She denies any history of similar symptoms.  Medical history is notable for CKD, HLD, HTN.  She does take a baby aspirin.  She does not take any anticoagulation.  She did undergo a stress test at [facility] 2 weeks ago.  This was performed as part of her work-up for possible kidney transplant.  Patient has never been started on dialysis.  She does state that she has been found to have high potassium in the past, due to her decreased kidney function.  Prior to the onset of the symptoms today, patient was in her normal state of health.  Currently, she endorses continued left-sided chest discomfort and shortness of breath.  She denies sensation of palpitations.  She denies lightheadedness or dizziness. HPI: A 70 year old patient with a history of treated diabetes, hypertension, hypercholesterolemia and obesity presents for evaluation of chest pain. Initial onset of pain was more than 6 hours ago. The patient's chest pain is not worse with exertion. The patient reports some diaphoresis. The patient's chest pain is not middle- or left-sided, is not well-localized, is not described as heaviness/pressure/tightness, is not sharp and does not radiate to the arms/jaw/neck. The patient does not complain of nausea. The patient has no history of stroke, has no history of peripheral artery disease, has not smoked in the past 90 days and has  no relevant family history of coronary artery disease (first degree relative at less than age 66).   Past Medical History:  Diagnosis Date   Allergic rhinitis    Carbuncle, trunk    Carpal tunnel syndrome    Chronic kidney disease    Combined hyperlipidemia associated with type 2 diabetes mellitus (HCC)    Complication of anesthesia    Cyclical vomiting    Dependent edema    Diabetes mellitus type II    Diabetic autonomic neuropathy (HCC)    Diabetic peripheral neuropathy associated with type 2 diabetes mellitus ([place])    Dyspepsia    Essential hypertension, benign    GERD (gastroesophageal reflux disease)    Goiter    Hypercholesteremia    Menopause    Obesity    OSA (obstructive sleep apnea) 09/23/2016   Osteopenia    PONV (postoperative nausea and vomiting)    Tachycardia    Type II or unspecified type diabetes mellitus without mention of complication, not stated as uncontrolled     Patient Active Problem List   Diagnosis Date Noted   NSTEMI (non-ST elevated myocardial infarction) ([place]) 10/06/2021   Acute renal failure superimposed on stage 4 chronic kidney disease ([place]) 10/06/2021   SVT (supraventricular tachycardia) ([place]) 10/05/2021   Autoimmune thyroiditis 08/01/2021   Polyarthralgia 08/01/2021   Diabetes mellitus type 2, uncomplicated ([place]) 64/33/2951   OSA (obstructive sleep apnea) 09/23/2016   Pre-transplant evaluation for kidney transplant 03/15/2013   Hypoglycemia associated with diabetes ([place]) 03/10/2013   Gout 10/24/2012   Goiter  Obesity    Diabetic peripheral neuropathy associated with type 2 diabetes mellitus (HCC)    Diabetic autonomic neuropathy (HCC)    Tachycardia    Combined hyperlipidemia associated with type 2 diabetes mellitus ([place])    Dyspepsia    Hyperlipidemia 06/03/2011   CKD (chronic kidney disease), stage IV ([place]) 06/03/2011   DIABETES MELLITUS, TYPE II 03/22/2008   Essential hypertension 03/22/2008   THYROIDITIS, HX OF 03/22/2008     Past Surgical History:  Procedure Laterality Date   CESAREAN SECTION  1983   COLONOSCOPY WITH PROPOFOL N/A 07/09/2018   Procedure: COLONOSCOPY WITH PROPOFOL;  Surgeon: Juanita Craver, MD;  Location: [facility] ENDOSCOPY;  Service: Endoscopy;  Laterality: N/A;   DILATION AND CURETTAGE OF UTERUS  1988   GANGLION CYST EXCISION  1959   rt wrist   POLYPECTOMY  07/09/2018   Procedure: POLYPECTOMY;  Surgeon: Juanita Craver, MD;  Location: [facility] ENDOSCOPY;  Service: Endoscopy;;     OB History     Gravida  1   Para  1   Term      Preterm      AB      Living         SAB      IAB      Ectopic      Multiple      Live Births              Family History  Problem Relation Age of Onset   COPD Mother    Hypertension Mother    Thyroid disease Mother    Heart disease Father    Hyperlipidemia Father    Diabetes Father    Heart disease Sister    Cancer Maternal Grandfather    Diabetes Paternal Grandmother    Breast cancer Neg Hx     Social History   Tobacco Use   Smoking status: Never   Smokeless tobacco: Never  Vaping Use   Vaping Use: Never used  Substance Use Topics   Alcohol use: No    Alcohol/week: 0.0 - 1.0 standard drinks    Comment: rarely   Drug use: No    Home Medications Prior to Admission medications   Medication Sig Start Date End Date Taking? Authorizing Provider  Accu-Chek Softclix Lancets lancets See admin instructions.    [provider]  Ascorbic Acid (VITAMIN C) 500 MG CAPS Take 500 mg by mouth daily.    [provider]  aspirin 81 MG chewable tablet Chew 81 mg by mouth daily.    [provider]  atorvastatin (LIPITOR) 40 MG tablet Take 1 tablet by mouth daily. 02/06/21   [provider]  B-D UF III MINI PEN NEEDLES 31G X 5 MM MISC  01/15/20   [provider]  Blood Glucose Monitoring Suppl (ACCU-CHEK GUIDE ME) w/Device KIT See admin instructions. 03/09/20   [provider]  carvedilol (COREG) 25 MG  tablet Take 1 tablet (25 mg total) by mouth 2 (two) times daily with a meal. 10/06/21 11/05/21  Antonieta Pert, MD  cholecalciferol (VITAMIN D) 1000 units tablet Take 1,000 Units by mouth 3 (three) times daily.    [provider]  Cholecalciferol 50 MCG (2000 UT) CAPS Take by mouth. 07/13/09   [provider]  Continuous Blood Gluc Sensor (FREESTYLE LIBRE 2 SENSOR) MISC See admin instructions. 09/25/20   [provider]  ezetimibe (ZETIA) 10 MG tablet 1 tablet 04/13/21   [provider]  febuxostat (ULORIC) 40 MG tablet  1 tablet    [provider]  furosemide (LASIX) 20 MG tablet 1 tablet    [provider]  glucose blood (ONETOUCH VERIO) test strip Check blood sugar 3 x daily 10/26/14   Sherrlyn Hock, MD  insulin lispro (HUMALOG) 100 UNIT/ML KwikPen 3-5u 03/16/20   [provider]  insulin NPH Human (NOVOLIN N) 100 UNIT/ML injection 15-25u    [provider]  ketoconazole (NIZORAL) 2 % cream Apply 1 application topically daily. 03/09/20   Elby Showers, MD  loratadine (CLARITIN) 10 MG tablet Take 10 mg by mouth as needed.     [provider]  Multiple Vitamin (MULTIVITAMIN) capsule Take 1 capsule by mouth daily.    [provider]  nitroGLYCERIN (NITROSTAT) 0.4 MG SL tablet Place under the tongue. 11/09/20 11/09/21  [provider]  promethazine (PHENERGAN) 25 MG tablet Take 25 mg by mouth daily as needed for nausea.    [provider]  sodium bicarbonate 650 MG tablet 1 tablet    [provider]    Allergies    Fish oil, Metformin, Metformin and related, Fenofibrate, Cephalexin, Ciprocin-fluocin-procin [fluocinolone], Ciprofloxacin, Fenofibrate, Insulins, Latex, Niacin, Orange fruit [citrus], Ramipril, Ramipril, and Sulfa antibiotics  Review of Systems   Review of Systems  Constitutional:  Positive for diaphoresis. Negative for chills and fever.  HENT:  Negative for congestion,  ear pain and sore throat.   Eyes:  Negative for pain and visual disturbance.  Respiratory:  Positive for chest tightness and shortness of breath. Negative for cough and wheezing.   Cardiovascular:  Positive for chest pain. Negative for palpitations and leg swelling.  Gastrointestinal:  Negative for abdominal pain, diarrhea, nausea and vomiting.  Genitourinary:  Negative for dysuria, flank pain, hematuria, pelvic pain and urgency.  Musculoskeletal:  Negative for arthralgias, back pain, myalgias and neck pain.  Skin:  Negative for color change, pallor and rash.  Neurological:  Negative for dizziness, seizures, syncope, weakness, light-headedness, numbness and headaches.  Hematological:  Does not bruise/bleed easily.  Psychiatric/Behavioral:  Negative for confusion and decreased concentration.   All other systems reviewed and are negative.  Physical Exam Updated Vital Signs BP (!) 150/77   Pulse 82   Temp 98 F (36.7 C) (Oral)   Resp 18   Ht _0  (1.626 m)   Wt 100.7 kg   SpO2 99%   BMI 38.11 kg/m   Physical Exam Vitals and nursing note reviewed.  Constitutional:      General: She is not in acute distress.    Appearance: She is well-developed. She is not ill-appearing, toxic-appearing or diaphoretic.  HENT:     Head: Normocephalic and atraumatic.  Eyes:     Extraocular Movements: Extraocular movements intact.     Conjunctiva/sclera: Conjunctivae normal.  Neck:     Vascular: No JVD.  Cardiovascular:     Rate and Rhythm: Regular rhythm. Tachycardia present.     Heart sounds:    No friction rub.  Pulmonary:     Effort: Pulmonary effort is normal. Tachypnea present. No accessory muscle usage or respiratory distress.     Breath sounds: Normal breath sounds. No decreased breath sounds, wheezing or rales.  Chest:     Chest wall: No tenderness.  Abdominal:     Palpations: Abdomen is soft.     Tenderness: There is no abdominal tenderness.  Musculoskeletal:        General:  Normal range of motion.     Cervical back: Neck supple.  Right lower leg: No edema.     Left lower leg: No edema.  Skin:    General: Skin is warm and dry.     Capillary Refill: Capillary refill takes less than 2 seconds.     Coloration: Skin is not pale.  Neurological:     General: No focal deficit present.     Mental Status: She is alert and oriented to person, place, and time.     Cranial Nerves: No cranial nerve deficit.     Motor: No weakness.  Psychiatric:        Mood and Affect: Mood normal.        Behavior: Behavior normal.    ED Results / Procedures / Treatments   Labs (all labs ordered are listed, but only abnormal results are displayed) Labs Reviewed  BASIC METABOLIC PANEL - Abnormal; Notable for the following components:      Result Value   Potassium 5.4 (*)    CO2 19 (*)    Glucose, Bld 222 (*)    BUN 55 (*)    Creatinine, Ser 2.53 (*)    GFR, Estimated 20 (*)    All other components within normal limits  CBC WITH DIFFERENTIAL/PLATELET - Abnormal; Notable for the following components:   WBC 10.7 (*)    All other components within normal limits  HEPARIN LEVEL (UNFRACTIONATED) - Abnormal; Notable for the following components:   Heparin Unfractionated <0.10 (*)    All other components within normal limits  HEPARIN LEVEL (UNFRACTIONATED) - Abnormal; Notable for the following components:   Heparin Unfractionated 0.25 (*)    All other components within normal limits  GLUCOSE, CAPILLARY - Abnormal; Notable for the following components:   Glucose-Capillary 148 (*)    All other components within normal limits  BASIC METABOLIC PANEL - Abnormal; Notable for the following components:   Chloride 114 (*)    CO2 20 (*)    Glucose, Bld 125 (*)    BUN 51 (*)    Creatinine, Ser 2.25 (*)    GFR, Estimated 23 (*)    All other components within normal limits  LIPID PANEL - Abnormal; Notable for the following components:   Triglycerides 338 (*)    VLDL 68 (*)    All  other components within normal limits  HEMOGLOBIN A1C - Abnormal; Notable for the following components:   Hgb A1c MFr Bld 7.6 (*)    All other components within normal limits  GLUCOSE, CAPILLARY - Abnormal; Notable for the following components:   Glucose-Capillary 121 (*)    All other components within normal limits  TROPONIN I (HIGH SENSITIVITY) - Abnormal; Notable for the following components:   Troponin I (High Sensitivity) 850 (*)    All other components within normal limits  TROPONIN I (HIGH SENSITIVITY) - Abnormal; Notable for the following components:   Troponin I (High Sensitivity) 4,520 (*)    All other components within normal limits  TROPONIN I (HIGH SENSITIVITY) - Abnormal; Notable for the following components:   Troponin I (High Sensitivity) 5,871 (*)    All other components within normal limits  TROPONIN I (HIGH SENSITIVITY) - Abnormal; Notable for the following components:   Troponin I (High Sensitivity) 5,744 (*)    All other components within normal limits  RESP PANEL BY RT-PCR (FLU A&B, COVID) ARPGX2  MAGNESIUM  PROTIME-INR  APTT  CBC  HIV ANTIBODY (ROUTINE TESTING W REFLEX)  TSH  HEPARIN LEVEL (UNFRACTIONATED)    EKG EKG Interpretation  Date/Time:  Friday October 05 2021 17:02:38 EDT Ventricular Rate:  108 PR Interval:  153 QRS Duration: 78 QT Interval:  325 QTC Calculation: 436 R Axis:   -25 Text Interpretation: Sinus tachycardia Borderline left axis deviation Low voltage, precordial leads Confirmed by Godfrey Pick [phone]) on 10/05/2021 5:24:08 PM  Radiology DG Chest Portable 1 View  Result Date: 10/05/2021 CLINICAL DATA:  Chest pain tachycardia EXAM: PORTABLE CHEST 1 VIEW COMPARISON:  06/23/2008 FINDINGS: No focal opacity or pleural effusion. Borderline cardiomegaly with aortic atherosclerosis. No pneumothorax. IMPRESSION: Borderline to mild cardiomegaly. Electronically Signed   By: Donavan Foil M.D.   On: 10/05/2021 17:26     Procedures .Cardioversion  Date/Time: 10/05/2021 5:00 PM Performed by: Godfrey Pick, MD Authorized by: Godfrey Pick, MD   Consent:    Consent obtained:  Verbal   Consent given by:  Patient   Alternatives discussed:  No treatment and delayed treatment Universal protocol:    Procedure explained and questions answered to patient or proxy's satisfaction: yes     Immediately prior to procedure a time out was called: yes   Pre-procedure details:    Cardioversion basis:  Elective   Rhythm:  Supraventricular tachycardia Patient sedated: No Attempt one:    Cardioversion mode attempt one: adenosine.   Shock outcome:  Conversion to normal sinus rhythm Post-procedure details:    Patient status:  Awake   Patient tolerance of procedure:  Tolerated well, no immediate complications   Medications Ordered in ED Medications  adenosine (ADENOCARD) 6 MG/2ML injection (has no administration in time range)  sodium chloride 0.9 % bolus 500 mL (500 mLs Intravenous New Bag/Given 10/05/21 1707)  adenosine (ADENOCARD) 6 MG/2ML injection 6 mg (6 mg Intravenous Given 10/05/21 1703)  metoprolol tartrate (LOPRESSOR) tablet 50 mg (50 mg Oral Given 10/05/21 1753)  aspirin chewable tablet 324 mg (162 mg Oral Given 10/05/21 1909)  heparin bolus via infusion 4,000 Units (4,000 Units Intravenous Bolus from Bag 10/05/21 1927)  0.9 %  sodium chloride infusion ( Intravenous New Bag/Given 10/05/21 1931)    ED Course  I have reviewed the triage vital signs and the nursing notes.  Pertinent labs & imaging results that were available during my care of the patient were reviewed by me and considered in my medical decision making (see chart for details).    MDM Rules/Calculators/A&P HEAR Score: 4                       CRITICAL CARE Performed by: Godfrey Pick   Total critical care time: 60 minutes  Critical care time was exclusive of separately billable procedures and treating other patients.  Critical care was  necessary to treat or prevent imminent or life-threatening deterioration.  Critical care was time spent personally by me on the following activities: development of treatment plan with patient and/or surrogate as well as nursing, discussions with consultants, evaluation of patient's response to treatment, examination of patient, obtaining history from patient or surrogate, ordering and performing treatments and interventions, ordering and review of laboratory studies, ordering and review of radiographic studies, pulse oximetry and re-evaluation of patient's condition.   Patient is a 70 year old female with no known history of tachydysrhythmias, who presents for cute onset of left-sided chest pain and shortness of breath.  Tachycardic in the range of 195 upon arrival.  EKG shows narrow complex QRS and regular rhythm, suggestive of SVT.  Blood pressure was normal.  Patient was overall well-appearing.  She does endorse continued left-sided chest discomfort  and shortness of breath.  She is alert and oriented.  I discussed her current condition with her and plan for correcting her rapid heart rate.  Lab work was obtained.  Patient was kept on bedside cardiac monitor.  Electrodes were placed in preparation of possible decompensation.  Plan will be for adenosine cardioversion.  Patient's SVT resolved after first attempt using 6 mg of adenosine.  Subsequent heart rate was in the range of 115.  EKG confirmed normal sinus rhythm.  Patient reported improved symptoms.  She was kept on bedside cardiac monitor.  I discussed with cardiology.  Cardiology recommends obtaining lab work.  If lab work is normal, patient would be able to go home with close cardiology follow-up.  They did recommend a 50 mg dose of metoprolol tartrate in the ED.  They also recommended that patient double her home dose of Coreg.  Initial labs showed a potassium of 5.4 with hemolysis.  Given presence of hemolysis, no concern for hyperkalemia at this  time.  On reassessment, patient reported continued resolution of symptoms.  Following bolus of IV fluids, patient's heart rate normalized.  Initial troponin was elevated at 850.  Cardiology was notified and they recommended admission to [facility].  Patient states that she took 162 of ASA prior to arrival.  Patient was given additional ASA and started on heparin GTT.  She was admitted for further management.  Home occasions were ordered.  She remained in the ED awaiting transport.  She continued to deny any symptoms.  Second troponin came back markedly elevated at 4520.  Patient was transported to [facility] for further management.  Final Clinical Impression(s) / ED Diagnoses Final diagnoses:  SVT (supraventricular tachycardia) ([place])    Rx / DC Orders ED Discharge Orders          Ordered    carvedilol (COREG) 25 MG tablet  2 times daily with meals        10/06/21 1016    Increase activity slowly        10/06/21 1046    Discharge instructions       Comments: Follow-up with your cardiology group.  Check BMP next Monday and to schedule.  Continue on low potassium diet  Please call call MD or return to ER for similar or worsening recurring problem that brought you to hospital or if any fever,nausea/vomiting,abdominal pain, uncontrolled pain, chest pain,  shortness of breath or any other alarming symptoms.  Please follow-up your doctor as instructed in a week time and call the office for appointment.  Please avoid alcohol, smoking, or any other illicit substance and maintain healthy habits including taking your regular medications as prescribed.  You were cared for by a hospitalist during your hospital stay. If you have any questions about your discharge medications or the care you received while you were in the hospital after you are discharged, you can call the unit and ask to speak with the hospitalist on call if the hospitalist that took care of you is not available.  Once you are  discharged, your primary care physician will handle any further medical issues. Please note that NO REFILLS for any discharge medications will be authorized once you are discharged, as it is imperative that you return to your primary care physician (or establish a relationship with a primary care physician if you do not have one) for your aftercare needs so that they can reassess your need for medications and monitor your lab values   10/06/21  1046    (HEART FAILURE PATIENTS) Call MD:  Anytime you have any of the following symptoms: 1) 3 pound weight gain in 24 hours or 5 pounds in 1 week 2) shortness of breath, with or without a dry hacking cough 3) swelling in the hands, feet or stomach 4) if you have to sleep on extra pillows at night in order to breathe.        10/06/21 1046             Godfrey Pick, MD 10/07/21 1256

## 2021-10-05 NOTE — ED Notes (Signed)
Patient took 162 of ASA prior, please administer only add'l 162.

## 2021-10-05 NOTE — ED Notes (Signed)
[facility] at bedside 

## 2021-10-06 DIAGNOSIS — E782 Mixed hyperlipidemia: Secondary | ICD-10-CM

## 2021-10-06 DIAGNOSIS — I214 Non-ST elevation (NSTEMI) myocardial infarction: Secondary | ICD-10-CM | POA: Diagnosis not present

## 2021-10-06 DIAGNOSIS — Z20822 Contact with and (suspected) exposure to covid-19: Secondary | ICD-10-CM | POA: Diagnosis not present

## 2021-10-06 DIAGNOSIS — Z794 Long term (current) use of insulin: Secondary | ICD-10-CM | POA: Diagnosis not present

## 2021-10-06 DIAGNOSIS — N184 Chronic kidney disease, stage 4 (severe): Secondary | ICD-10-CM

## 2021-10-06 DIAGNOSIS — N179 Acute kidney failure, unspecified: Secondary | ICD-10-CM | POA: Diagnosis not present

## 2021-10-06 DIAGNOSIS — E1169 Type 2 diabetes mellitus with other specified complication: Secondary | ICD-10-CM | POA: Diagnosis not present

## 2021-10-06 DIAGNOSIS — Z7982 Long term (current) use of aspirin: Secondary | ICD-10-CM | POA: Diagnosis not present

## 2021-10-06 DIAGNOSIS — I10 Essential (primary) hypertension: Secondary | ICD-10-CM

## 2021-10-06 DIAGNOSIS — G4733 Obstructive sleep apnea (adult) (pediatric): Secondary | ICD-10-CM | POA: Diagnosis not present

## 2021-10-06 DIAGNOSIS — I471 Supraventricular tachycardia: Secondary | ICD-10-CM

## 2021-10-06 DIAGNOSIS — Z91040 Latex allergy status: Secondary | ICD-10-CM | POA: Diagnosis not present

## 2021-10-06 DIAGNOSIS — I129 Hypertensive chronic kidney disease with stage 1 through stage 4 chronic kidney disease, or unspecified chronic kidney disease: Secondary | ICD-10-CM | POA: Diagnosis not present

## 2021-10-06 DIAGNOSIS — Z79899 Other long term (current) drug therapy: Secondary | ICD-10-CM | POA: Diagnosis not present

## 2021-10-06 DIAGNOSIS — E1122 Type 2 diabetes mellitus with diabetic chronic kidney disease: Secondary | ICD-10-CM | POA: Diagnosis not present

## 2021-10-06 LAB — TROPONIN I (HIGH SENSITIVITY)
Troponin I (High Sensitivity): 5871 ng/L (ref <18)
Troponin I (High Sensitivity): 5744 ng/L (ref <18)

## 2021-10-06 LAB — BASIC METABOLIC PANEL
Anion gap: 7 (ref 5–15)
BUN: 51 mg/dL — ABNORMAL HIGH (ref 8–23)
CO2: 20 mmol/L — ABNORMAL LOW (ref 22–32)
Calcium: 9.1 mg/dL (ref 8.9–10.3)
Chloride: 114 mmol/L — ABNORMAL HIGH (ref 98–111)
Creatinine, Ser: 2.25 mg/dL — ABNORMAL HIGH (ref 0.44–1.00)
GFR, Estimated: 23 mL/min — ABNORMAL LOW (ref >60)
Glucose, Bld: 125 mg/dL — ABNORMAL HIGH (ref 70–99)
Potassium: 5 mmol/L (ref 3.5–5.1)
Sodium: 141 mmol/L (ref 135–145)

## 2021-10-06 LAB — LIPID PANEL
Cholesterol: 155 mg/dL (ref 0–200)
HDL: 42 mg/dL (ref >40)
LDL Cholesterol: 45 mg/dL (ref 0–99)
Total CHOL/HDL Ratio: 3.7 RATIO
Triglycerides: 338 mg/dL — ABNORMAL HIGH (ref <150)
VLDL: 68 mg/dL — ABNORMAL HIGH (ref 0–40)

## 2021-10-06 LAB — CBC
HCT: 38.4 % (ref 36.0–46.0)
Hemoglobin: 12.4 g/dL (ref 12.0–15.0)
MCH: 31.6 pg (ref 26.0–34.0)
MCHC: 32.3 g/dL (ref 30.0–36.0)
MCV: 98 fL (ref 80.0–100.0)
Platelets: 283 10*3/uL (ref 150–400)
RBC: 3.92 MIL/uL (ref 3.87–5.11)
RDW: 14 % (ref 11.5–15.5)
WBC: 8.7 10*3/uL (ref 4.0–10.5)
nRBC: 0 % (ref 0.0–0.2)

## 2021-10-06 LAB — HIV ANTIBODY (ROUTINE TESTING W REFLEX): HIV Screen 4th Generation wRfx: NONREACTIVE

## 2021-10-06 LAB — HEMOGLOBIN A1C
Hgb A1c MFr Bld: 7.6 % — ABNORMAL HIGH (ref 4.8–5.6)
Mean Plasma Glucose: 171.42 mg/dL

## 2021-10-06 LAB — GLUCOSE, CAPILLARY: Glucose-Capillary: 121 mg/dL — ABNORMAL HIGH (ref 70–99)

## 2021-10-06 LAB — HEPARIN LEVEL (UNFRACTIONATED)
Heparin Unfractionated: 0.25 IU/mL — ABNORMAL LOW (ref 0.30–0.70)
Heparin Unfractionated: 0.38 IU/mL (ref 0.30–0.70)

## 2021-10-06 LAB — TSH: TSH: 1.554 u[IU]/mL (ref 0.350–4.500)

## 2021-10-06 MED ORDER — ONDANSETRON HCL 4 MG/2ML IJ SOLN: 4.0000 mg | Freq: Four times a day (QID) | INTRAMUSCULAR | Status: DC | PRN

## 2021-10-06 MED ORDER — ASPIRIN 81 MG PO CHEW
81.0000 mg | CHEWABLE_TABLET | Freq: Every day | ORAL | Status: DC
  Administered 2021-10-06: 81 mg via ORAL
  Filled 2021-10-06: qty 1

## 2021-10-06 MED ORDER — CARVEDILOL 25 MG PO TABS: 25.0000 mg | ORAL_TABLET | Freq: Two times a day (BID) | ORAL | 0 refills | Status: DC

## 2021-10-06 MED ORDER — FEBUXOSTAT 40 MG PO TABS: 40.0000 mg | ORAL_TABLET | Freq: Every day | ORAL | Status: DC

## 2021-10-06 MED ORDER — INSULIN ASPART 100 UNIT/ML IJ SOLN: 0.0000 [IU] | Freq: Three times a day (TID) | INTRAMUSCULAR | Status: DC

## 2021-10-06 MED ORDER — ATORVASTATIN CALCIUM 40 MG PO TABS: 40.0000 mg | ORAL_TABLET | Freq: Every evening | ORAL | Status: DC

## 2021-10-06 MED ORDER — INSULIN NPH (HUMAN) (ISOPHANE) 100 UNIT/ML ~~LOC~~ SUSP
15.0000 [IU] | Freq: Two times a day (BID) | SUBCUTANEOUS | Status: DC
  Administered 2021-10-06: 15 [IU] via SUBCUTANEOUS

## 2021-10-06 MED ORDER — NITROGLYCERIN 0.4 MG SL SUBL: 0.4000 mg | SUBLINGUAL_TABLET | SUBLINGUAL | Status: DC | PRN

## 2021-10-06 MED ORDER — ACETAMINOPHEN 325 MG PO TABS
650.0000 mg | ORAL_TABLET | ORAL | Status: DC | PRN
Start: 2021-10-06 — End: 2021-10-06

## 2021-10-06 NOTE — Discharge Summary (Signed)
Physician Discharge Summary  [name] ZOX:[id number] DOB: Apr 28, 1951 DOA: 10/05/2021  PCP: Elby Showers, MD  Admit date: 10/05/2021 Discharge date: 10/06/2021  Admitted From: Home Disposition: Home  Recommendations for Outpatient Follow-up:  Follow up with PCP in 1-2 weeks Follow-up with your cardiology at [place] Please obtain BMP coming Monday to schedule Please follow up on the following pending results:  Home Health:no Equipment/Devices: None  Discharge Condition: Stable Code Status:   Code Status: Full Code Diet recommendation:  Diet Order             Diet heart healthy/carb modified Room service appropriate? Yes; Fluid consistency: Thin  Diet effective now                 Brief/Interim Summary: 70 y.o. female with medical history significant of CKD 4, history of thyroiditis, hyperlipidemia, diabetes, gout, hypertension, OSA, GERD presenting after episode of chest pain shortness of breath onset about an hour prior to presentation.Reports some chronic mild SOB since having covid. Did have echo 2 weeks ago at [place]. She denies fevers, chills, abdominal pain, constipation, diarrhea, nausea, vomiting. In the ED significant tachycardia with SVT heart rate 170-190, adenosine was administered with improved heart rate to the normal range.  Lab work-up showed BMP with potassium 5.4, bicarb 19, BUN 55, creatinine elevated 2.53 from baseline around 2.2, glucose 222.  CBC with mild leukocytosis to 10.7.  PT, PTT, INR within normal limits.  Troponin elevated to 850 on first check and 4500 on repeat.  Rest were panel for flu and COVID-negative.  Chest x-ray showed mild cardiomegaly.  Patient started on heparin drip in addition to adenosine and IV fluids in the ED.  Also restarted home Coreg and statin as well as administering aspirin.  Cardiology was consulted and requested [facility] admission.  Monitored overnight, and this time SVT has resolved seen by cardiology Coreg was  increased.  Patient asymptomatic.  NSTEMI ruled out and elevated troponin due to demand mismatch due to tachycardia Heparin drip discontinued.  Cardiology cleared the patient for discharge home and advised follow-up with her [place] cardiology and consider EP evaluation as outpatient. AKI has resolved back to CKD baseline with potassium higher side she will need to continue renal/low potassium diet check BMP next week. Rest of the problems are chronic and stable and she will continue on her home meds  Discharge Diagnoses:   SVT -status post adenosine and a dose of Coreg.  Resolved.  Continue 25 twice daily Coreg.  Follow-up with her cardiology at [facility]  At this time she is asymptomatic and heart rate in sinus rhythm and is stable.  Elevated troponin due to demand mismatch due to tachycardia/SVT NSTEMI ruled out  Heparin drip discontinued.  Cardiology cleared the patient for discharge home and advised follow-up with her [place] cardiology and consider EP evaluation as outpatient.  Essential hypertension-stable on increased dose of Coreg  AKI on CKD stage IV Metabolic acidosis bicarb 20: Renal function at this time improved AND stable at 2.2 from 2.5 Recent Labs    03/09/21 1123 06/26/21 1223 10/05/21 1640 10/06/21 0048  BUN 51* 68* 55* 51*  CREATININE 2.18* 2.35* 2.53* 2.25*    Hyperkalemia-K improved to 5.  Advise low potassium diet at home this is her baseline potassium the patient is well aware about renal diet and has bmp scheduled coming Monday. Recent Labs  Lab 10/05/21 1640 10/06/21 0048  K 5.4* 5.0    Diabetes mellitus on insulin resume her home  insulin.  Stable HbA1c 7.6. OSA on CPAP  Consults: Cardiology  Subjective: Alert awake oriented, no chest pain. Discharge Exam: Vitals:   10/06/21 0759 10/06/21 0913  BP: 130/77 (!) 150/77  Pulse: 79 82  Resp: 18   Temp: 98 F (36.7 C)   SpO2: 99%    General: Pt is alert, awake, not in acute distress Cardiovascular: RRR,  S1/S2 +, no rubs, no gallops Respiratory: CTA bilaterally, no wheezing, no rhonchi Abdominal: Soft, NT, ND, bowel sounds + Extremities: no edema, no cyanosis  Discharge Instructions  Discharge Instructions     (HEART FAILURE PATIENTS) Call MD:  Anytime you have any of the following symptoms: 1) 3 pound weight gain in 24 hours or 5 pounds in 1 week 2) shortness of breath, with or without a dry hacking cough 3) swelling in the hands, feet or stomach 4) if you have to sleep on extra pillows at night in order to breathe.   Complete by: As directed    Discharge instructions   Complete by: As directed    Follow-up with your cardiology group.  Check BMP next Monday and to schedule.  Continue on low potassium diet  Please call call MD or return to ER for similar or worsening recurring problem that brought you to hospital or if any fever,nausea/vomiting,abdominal pain, uncontrolled pain, chest pain,  shortness of breath or any other alarming symptoms.  Please follow-up your doctor as instructed in a week time and call the office for appointment.  Please avoid alcohol, smoking, or any other illicit substance and maintain healthy habits including taking your regular medications as prescribed.  You were cared for by a hospitalist during your hospital stay. If you have any questions about your discharge medications or the care you received while you were in the hospital after you are discharged, you can call the unit and ask to speak with the hospitalist on call if the hospitalist that took care of you is not available.  Once you are discharged, your primary care physician will handle any further medical issues. Please note that NO REFILLS for any discharge medications will be authorized once you are discharged, as it is imperative that you return to your primary care physician (or establish a relationship with a primary care physician if you do not have one) for your aftercare needs so that they can  reassess your need for medications and monitor your lab values   Increase activity slowly   Complete by: As directed       Allergies as of 10/06/2021       Reactions   Fish Oil Shortness Of Breath   Other reaction(s): Unknown   Metformin    Other reaction(s): Other (See Comments), Unknown "Facial puffiness" Pt stated her kidney specialist told her to add metformin to her drug allergies list.   Metformin And Related Other (See Comments)   KIDNEY FAILURE?   Fenofibrate    fever   Cephalexin Diarrhea, Nausea And Vomiting   Ciprocin-fluocin-procin [fluocinolone]    Acute kidney failure   Ciprofloxacin Other (See Comments)   Kidney failure Other reaction(s): Unknown   Fenofibrate    Other reaction(s): fever   Insulins Other (See Comments)   Migraines, nausea Other reaction(s): Other (See Comments), Unknown Migraines, nausea NOVLOG   Latex    Other reaction(s): rash   Niacin    Other reaction(s): Unknown   Orange Fruit [citrus]    Ramipril Cough   Ramipril    Other reaction(s): cough  Sulfa Antibiotics Rash        Medication List     TAKE these medications    Accu-Chek Guide Me w/Device Kit See admin instructions.   Accu-Chek Softclix Lancets lancets See admin instructions.   aspirin 81 MG chewable tablet Chew 81 mg by mouth daily.   atorvastatin 40 MG tablet Commonly known as: LIPITOR Take 1 tablet by mouth daily.   B-D UF III MINI PEN NEEDLES 31G X 5 MM Misc Generic drug: Insulin Pen Needle   carvedilol 25 MG tablet Commonly known as: COREG Take 1 tablet (25 mg total) by mouth 2 (two) times daily with a meal. What changed:  medication strength See the new instructions.   cholecalciferol 1000 units tablet Commonly known as: VITAMIN D Take 1,000 Units by mouth 3 (three) times daily.   Cholecalciferol 50 MCG (2000 UT) Caps Take by mouth.   ezetimibe 10 MG tablet Commonly known as: ZETIA 1 tablet   febuxostat 40 MG tablet Commonly known as:  ULORIC 1 tablet   FreeStyle Libre 2 Sensor Misc See admin instructions.   furosemide 20 MG tablet Commonly known as: LASIX 1 tablet   glucose blood test strip Commonly known as: OneTouch Verio Check blood sugar 3 x daily   insulin lispro 100 UNIT/ML KwikPen Commonly known as: HUMALOG 3-5u   insulin NPH Human 100 UNIT/ML injection Commonly known as: NOVOLIN N 15-25u   ketoconazole 2 % cream Commonly known as: NIZORAL Apply 1 application topically daily.   loratadine 10 MG tablet Commonly known as: CLARITIN Take 10 mg by mouth as needed.   multivitamin capsule Take 1 capsule by mouth daily.   nitroGLYCERIN 0.4 MG SL tablet Commonly known as: NITROSTAT Place under the tongue.   promethazine 25 MG tablet Commonly known as: PHENERGAN Take 25 mg by mouth daily as needed for nausea.   sodium bicarbonate 650 MG tablet 1 tablet   Vitamin C 500 MG Caps Take 500 mg by mouth daily.        Follow-up Information     Baxley, Cresenciano Lick, MD Follow up in 1 week(s).   Specialty: Internal Medicine Contact information: [address] [address] [phone]                Allergies  Allergen Reactions   Fish Oil Shortness Of Breath    Other reaction(s): Unknown   Metformin     Other reaction(s): Other (See Comments), Unknown "Facial puffiness"  Pt stated her kidney specialist told her to add metformin to her drug allergies list.    Metformin And Related Other (See Comments)    KIDNEY FAILURE?   Fenofibrate     fever   Cephalexin Diarrhea and Nausea And Vomiting   Ciprocin-Fluocin-Procin [Fluocinolone]     Acute kidney failure   Ciprofloxacin Other (See Comments)    Kidney failure Other reaction(s): Unknown   Fenofibrate     Other reaction(s): fever   Insulins Other (See Comments)    Migraines, nausea Other reaction(s): Other (See Comments), Unknown Migraines, nausea NOVLOG    Latex     Other reaction(s): rash   Niacin      Other reaction(s): Unknown   Orange Fruit [Citrus]    Ramipril Cough   Ramipril     Other reaction(s): cough   Sulfa Antibiotics Rash    The results of significant diagnostics from this hospitalization (including imaging, microbiology, ancillary and laboratory) are listed below for reference.    Microbiology: Recent Results (from  the past 240 hour(s))  Resp Panel by RT-PCR (Flu A&B, Covid) Nasopharyngeal Swab     Status: None   Collection Time: 10/05/21  8:30 PM   Specimen: Nasopharyngeal Swab; Nasopharyngeal(NP) swabs in vial transport medium  Result Value Ref Range Status   SARS Coronavirus 2 by RT PCR NEGATIVE NEGATIVE Final    Comment: (NOTE) SARS-CoV-2 target nucleic acids are NOT DETECTED.  The SARS-CoV-2 RNA is generally detectable in upper respiratory specimens during the acute phase of infection. The lowest concentration of SARS-CoV-2 viral copies this assay can detect is 138 copies/mL. A negative result does not preclude SARS-Cov-2 infection and should not be used as the sole basis for treatment or other patient management decisions. A negative result may occur with  improper specimen collection/handling, submission of specimen other than nasopharyngeal swab, presence of viral mutation(s) within the areas targeted by this assay, and inadequate number of viral copies(<138 copies/mL). A negative result must be combined with clinical observations, patient history, and epidemiological information. The expected result is Negative.  Fact Sheet for Patients:  EntrepreneurPulse.com.au  Fact Sheet for Healthcare Providers:  IncredibleEmployment.be  This test is no t yet approved or cleared by the [place] FDA and  has been authorized for detection and/or diagnosis of SARS-CoV-2 by FDA under an Emergency Use Authorization (EUA). This EUA will remain  in effect (meaning this test can be used) for the duration of the COVID-19  declaration under Section 564(b)(1) of the Act, 21 U.S.C.section 360bbb-3(b)(1), unless the authorization is terminated  or revoked sooner.       Influenza A by PCR NEGATIVE NEGATIVE Final   Influenza B by PCR NEGATIVE NEGATIVE Final    Comment: (NOTE) The Xpert Xpress SARS-CoV-2/FLU/RSV plus assay is intended as an aid in the diagnosis of influenza from Nasopharyngeal swab specimens and should not be used as a sole basis for treatment. Nasal washings and aspirates are unacceptable for Xpert Xpress SARS-CoV-2/FLU/RSV testing.  Fact Sheet for Patients: EntrepreneurPulse.com.au  Fact Sheet for Healthcare Providers: IncredibleEmployment.be  This test is not yet approved or cleared by the [place] FDA and has been authorized for detection and/or diagnosis of SARS-CoV-2 by FDA under an Emergency Use Authorization (EUA). This EUA will remain in effect (meaning this test can be used) for the duration of the COVID-19 declaration under Section 564(b)(1) of the Act, 21 U.S.C. section 360bbb-3(b)(1), unless the authorization is terminated or revoked.  Performed at [facility], [address], [place], [place] [address]     Procedures/Studies: DG Chest Portable 1 View  Result Date: 10/05/2021 CLINICAL DATA:  Chest pain tachycardia EXAM: PORTABLE CHEST 1 VIEW COMPARISON:  06/23/2008 FINDINGS: No focal opacity or pleural effusion. Borderline cardiomegaly with aortic atherosclerosis. No pneumothorax. IMPRESSION: Borderline to mild cardiomegaly. Electronically Signed   By: Donavan Foil M.D.   On: 10/05/2021 17:26    Labs: BNP (last 3 results) No results for input(s): BNP in the last 8760 hours. Basic Metabolic Panel: Recent Labs  Lab 10/05/21 1640 10/06/21 0048  NA 140 141  K 5.4* 5.0  CL 110 114*  CO2 19* 20*  GLUCOSE 222* 125*  BUN 55* 51*  CREATININE 2.53* 2.25*  CALCIUM 9.8 9.1  MG 2.0  --    Liver Function  Tests: No results for input(s): AST, ALT, ALKPHOS, BILITOT, PROT, ALBUMIN in the last 168 hours. No results for input(s): LIPASE, AMYLASE in the last 168 hours. No results for input(s): AMMONIA in the last 168 hours. CBC: Recent  Labs  Lab 10/05/21 1640 10/06/21 0048  WBC 10.7* 8.7  NEUTROABS 7.6  --   HGB 14.1 12.4  HCT 43.9 38.4  MCV 96.7 98.0  PLT 326 283   Cardiac Enzymes: No results for input(s): CKTOTAL, CKMB, CKMBINDEX, TROPONINI in the last 168 hours. BNP: Invalid input(s): POCBNP CBG: Recent Labs  Lab 10/05/21 2347 10/06/21 0757  GLUCAP 148* 121*   D-Dimer No results for input(s): DDIMER in the last 72 hours. Hgb A1c Recent Labs    10/06/21 0048  HGBA1C 7.6*   Lipid Profile Recent Labs    10/06/21 0048  CHOL 155  HDL 42  LDLCALC 45  TRIG 338*  CHOLHDL 3.7   Thyroid function studies Recent Labs    10/06/21 0048  TSH 1.554   Anemia work up No results for input(s): VITAMINB12, FOLATE, FERRITIN, TIBC, IRON, RETICCTPCT in the last 72 hours. Urinalysis    Component Value Date/Time   COLORURINE YELLOW 07/17/2008 0350   APPEARANCEUR CLOUDY (A) 07/17/2008 0350   LABSPEC 1.010 07/17/2008 0350   PHURINE 6.5 07/17/2008 0350   GLUCOSEU NEGATIVE 07/17/2008 0350   HGBUR LARGE (A) 07/17/2008 0350   BILIRUBINUR NEG 03/15/2021 1024   KETONESUR NEGATIVE 07/17/2008 0350   PROTEINUR Positive (A) 03/15/2021 1024   PROTEINUR NEGATIVE 07/17/2008 0350   UROBILINOGEN 0.2 03/15/2021 1024   UROBILINOGEN 0.2 07/17/2008 0350   NITRITE NEG 03/15/2021 1024   NITRITE NEGATIVE 07/17/2008 0350   LEUKOCYTESUR Negative 03/15/2021 1024   Sepsis Labs Invalid input(s): PROCALCITONIN,  WBC,  LACTICIDVEN Microbiology Recent Results (from the past 240 hour(s))  Resp Panel by RT-PCR (Flu A&B, Covid) Nasopharyngeal Swab     Status: None   Collection Time: 10/05/21  8:30 PM   Specimen: Nasopharyngeal Swab; Nasopharyngeal(NP) swabs in vial transport medium  Result Value Ref  Range Status   SARS Coronavirus 2 by RT PCR NEGATIVE NEGATIVE Final    Comment: (NOTE) SARS-CoV-2 target nucleic acids are NOT DETECTED.  The SARS-CoV-2 RNA is generally detectable in upper respiratory specimens during the acute phase of infection. The lowest concentration of SARS-CoV-2 viral copies this assay can detect is 138 copies/mL. A negative result does not preclude SARS-Cov-2 infection and should not be used as the sole basis for treatment or other patient management decisions. A negative result may occur with  improper specimen collection/handling, submission of specimen other than nasopharyngeal swab, presence of viral mutation(s) within the areas targeted by this assay, and inadequate number of viral copies(<138 copies/mL). A negative result must be combined with clinical observations, patient history, and epidemiological information. The expected result is Negative.  Fact Sheet for Patients:  EntrepreneurPulse.com.au  Fact Sheet for Healthcare Providers:  IncredibleEmployment.be  This test is no t yet approved or cleared by the [place] FDA and  has been authorized for detection and/or diagnosis of SARS-CoV-2 by FDA under an Emergency Use Authorization (EUA). This EUA will remain  in effect (meaning this test can be used) for the duration of the COVID-19 declaration under Section 564(b)(1) of the Act, 21 U.S.C.section 360bbb-3(b)(1), unless the authorization is terminated  or revoked sooner.       Influenza A by PCR NEGATIVE NEGATIVE Final   Influenza B by PCR NEGATIVE NEGATIVE Final    Comment: (NOTE) The Xpert Xpress SARS-CoV-2/FLU/RSV plus assay is intended as an aid in the diagnosis of influenza from Nasopharyngeal swab specimens and should not be used as a sole basis for treatment. Nasal washings and aspirates are unacceptable for Xpert Xpress  SARS-CoV-2/FLU/RSV testing.  Fact Sheet for  Patients: EntrepreneurPulse.com.au  Fact Sheet for Healthcare Providers: IncredibleEmployment.be  This test is not yet approved or cleared by the [place] FDA and has been authorized for detection and/or diagnosis of SARS-CoV-2 by FDA under an Emergency Use Authorization (EUA). This EUA will remain in effect (meaning this test can be used) for the duration of the COVID-19 declaration under Section 564(b)(1) of the Act, 21 U.S.C. section 360bbb-3(b)(1), unless the authorization is terminated or revoked.  Performed at [facility], [address], [place], [place] [address]      Time coordinating discharge: 25 minutes  SIGNED: Antonieta Pert, MD  [facility] 10/06/2021, 11:35 AM  If 7PM-7AM, please contact night-coverage [email or web address]

## 2021-10-06 NOTE — Consult Note (Addendum)
Cardiology Consultation:   Patient ID: Khaliyah Northrop MRN: [id number]; DOB: 09-09-1951  Admit date: 10/05/2021 Date of Consult: 10/06/2021  PCP:  Elby Showers, MD   [facility] Prov[facility] Cardiologist:  Dr. Edwin Dada ([facility])  Patient Profile:   Jamonica Schoff is a 70 y.o. female with a history of hypertension, hyperlipidemia, type 2 diabetes mellitus, CKD stage IV, GERD, and obstructive sleep apnea who is being seen today for the evaluation of SVT and elevated troponin at the request of Dr. Doren Custard.  History of Present Illness:   Ms. Oberman is a 70 year old female with the above history who is followed by Dr. Edwin Dada at [facility].  Per review of records in care everywhere, patient has a history of dyspnea on exertion.  She has CKD stage IV and is on the renal transplant list. He underwent a stress Echo in 07/2020 which was mildly abnormal. Target heart rate was not able to be reached but there was some hypokinesis of the apical septum and mid inferior septum noted. Decision was made not to proceed with cardiac cath due to her renal function. Last Echo in 07/2021 showed LVEF of >55% with mild LVH and no significant valvular disease. He was last seen by [facility] on 09/17/2021 at which time she stated her dyspnea at improved significantly until she got COVID again about 6 weeks prior. However, she was still going to the gym several days a week and using her home exercise bike. She was noted felt to be volume overloaded on exam and she denied any chest pain so continued monitoring was recommended.  Patient presented to the [place] ED on 10/05/2021 for chest pain and shortness of breath and was found to be in SVT with rates in the 190s. She was given Adenosine with restoration of sinus rhythm. Labs showed: High-sensitivity troponin of 850 >> 4,520. WBC 10.7, Hgb 14.1, Plts 326. Na 140, K 5.4, Glucose 222, BUN 55, Cr 2.53 (baseline around 2.2). Magnesium  2.0. Patient was started on IV Heparin. Also treated with IV fluids. She was admitted to [facility] and Cardiology consulted for further evaluation.  At the time of this evaluation, patient resting comfortably no acute distress.  She is maintaining sinus rhythm with rates in the 70s.  Patient states she was in her usual state of health until yesterday.  She states she was feeling fine yesterday morning and was running errands without any problems.  She got back home and was cleaning out her closet when all of a sudden she developed sudden onset of diaphoresis and states she just did not feel right.  Following this, she developed a very mild discomfort under her left arm and around left shoulder blade but denied any chest pain.  She described this discomfort as a "ache" like she pulled a muscle.  Again she states that she just did not feel right.  Reports having very mild palpitations but no lightheadedness, dizziness, syncope.  Her daughter was concerned and advised that she go to the ED.  She has never had any symptoms like this before.  She has chronic dyspnea on exertion which is stable.  No orthopnea or PND.  No trouble breathing at night as long as she is on CPAP.  No lower extremity edema.  No recent fevers or illnesses since being diagnosed with COVID about 2 months ago.  She has some mild nasal congestion but no other respiratory symptoms.  No abnormal bleeding in urine or stools.  She states she feels back to baseline after converting to normal sinus rhythm.  Past Medical History:  Diagnosis Date   Allergic rhinitis    Carbuncle, trunk    Carpal tunnel syndrome    Chronic kidney disease    Combined hyperlipidemia associated with type 2 diabetes mellitus (HCC)    Complication of anesthesia    Cyclical vomiting    Dependent edema    Diabetes mellitus type II    Diabetic autonomic neuropathy (HCC)    Diabetic peripheral neuropathy associated with type 2 diabetes mellitus (HCC)    Dyspepsia     Essential hypertension, benign    GERD (gastroesophageal reflux disease)    Goiter    Hypercholesteremia    Menopause    Obesity    OSA (obstructive sleep apnea) 09/23/2016   Osteopenia    PONV (postoperative nausea and vomiting)    Tachycardia    Type II or unspecified type diabetes mellitus without mention of complication, not stated as uncontrolled     Past Surgical History:  Procedure Laterality Date   CESAREAN SECTION  1983   COLONOSCOPY WITH PROPOFOL N/A 07/09/2018   Procedure: COLONOSCOPY WITH PROPOFOL;  Surgeon: Juanita Craver, MD;  Location: [facility]COPY;  Service: Endoscopy;  Laterality: N/A;   DILATION AND CURETTAGE OF UTERUS  1988   GANGLION CYST EXCISION  1959   rt wrist   POLYPECTOMY  07/09/2018   Procedure: POLYPECTOMY;  Surgeon: Juanita Craver, MD;  Location: [facility] ENDOSCOPY;  Service: Endoscopy;;     Home Medications:  Prior to Admission medications   Medication Sig Start Date End Date Taking? Authorizing Provider  Accu-Chek Softclix Lancets lancets See admin instructions.    [provider]  Ascorbic Acid (VITAMIN C) 500 MG CAPS Take 500 mg by mouth daily.    [provider]  aspirin 81 MG chewable tablet Chew 81 mg by mouth daily.    [provider]  atorvastatin (LIPITOR) 40 MG tablet Take 1 tablet by mouth daily. 02/06/21   [provider]  B-D UF III MINI PEN NEEDLES 31G X 5 MM MISC  01/15/20   [provider]  Blood Glucose Monitoring Suppl (ACCU-CHEK GUIDE ME) w/Device KIT See admin instructions. 03/09/20   [provider]  carvedilol (COREG) 12.5 MG tablet 1 tablet with food 02/14/21   [provider]  cholecalciferol (VITAMIN D) 1000 units tablet Take 1,000 Units by mouth 3 (three) times daily.    [provider]  Cholecalciferol 50 MCG (2000 UT) CAPS Take by mouth. 07/13/09   [provider]  Continuous Blood Gluc Sensor (FREESTYLE LIBRE 2 SENSOR) MISC See admin instructions. 09/25/20    [provider]  ezetimibe (ZETIA) 10 MG tablet 1 tablet 04/13/21   [provider]  febuxostat (ULORIC) 40 MG tablet 1 tablet    [provider]  furosemide (LASIX) 20 MG tablet 1 tablet    [provider]  glucose blood (ONETOUCH VERIO) test strip Check blood sugar 3 x daily 10/26/14   Sherrlyn Hock, MD  insulin lispro (HUMALOG) 100 UNIT/ML KwikPen 3-5u 03/16/20   [provider]  insulin NPH Human (NOVOLIN N) 100 UNIT/ML injection 15-25u    [provider]  ketoconazole (NIZORAL) 2 % cream Apply 1 application topically daily. 03/09/20   Elby Showers, MD  loratadine (CLARITIN) 10 MG tablet Take 10 mg by mouth as needed.     [provider]  Multiple Vitamin (MULTIVITAMIN) capsule Take 1 capsule by  mouth daily.    [provider]  nitroGLYCERIN (NITROSTAT) 0.4 MG SL tablet Place under the tongue. 11/09/20 11/09/21  [provider]  promethazine (PHENERGAN) 25 MG tablet Take 25 mg by mouth daily as needed for nausea.    [provider]  sodium bicarbonate 650 MG tablet 1 tablet    [provider]    Inpatient Medications: Scheduled Meds:  aspirin  81 mg Oral Daily   atorvastatin  40 mg Oral Daily   carvedilol  25 mg Oral BID WC   [START ON 10/07/2021] febuxostat  40 mg Oral Daily   insulin aspart  0-6 Units Subcutaneous TID WC   insulin NPH Human  15 Units Subcutaneous BID AC & HS   Continuous Infusions:  heparin 1,000 Units/hr (10/06/21 0300)   PRN Meds: acetaminophen, nitroGLYCERIN, ondansetron (ZOFRAN) IV  Allergies:    Allergies  Allergen Reactions   Fish Oil Shortness Of Breath    Other reaction(s): Unknown   Metformin     Other reaction(s): Other (See Comments), Unknown "Facial puffiness"  Pt stated her kidney specialist told her to add metformin to her drug allergies list.    Metformin And Related Other (See Comments)    KIDNEY FAILURE?   Fenofibrate     fever    Cephalexin Diarrhea and Nausea And Vomiting   Ciprocin-Fluocin-Procin [Fluocinolone]     Acute kidney failure   Ciprofloxacin Other (See Comments)    Kidney failure Other reaction(s): Unknown   Fenofibrate     Other reaction(s): fever   Insulins Other (See Comments)    Migraines, nausea Other reaction(s): Other (See Comments), Unknown Migraines, nausea NOVLOG    Latex     Other reaction(s): rash   Niacin     Other reaction(s): Unknown   Orange Fruit [Citrus]    Ramipril Cough   Ramipril     Other reaction(s): cough   Sulfa Antibiotics Rash    Social History:   Social History   Socioeconomic History   Marital status: Married    Spouse name: Not on file   Number of children: Not on file   Years of education: 12   Highest education level: Not on file  Occupational History   Occupation: retired  Tobacco Use   Smoking status: Never   Smokeless tobacco: Never  Vaping Use   Vaping Use: Never used  Substance and Sexual Activity   Alcohol use: No    Alcohol/week: 0.0 - 1.0 standard drinks    Comment: rarely   Drug use: No   Sexual activity: Not on file  Other Topics Concern   Not on file  Social History Narrative   Not on file   Social Determinants of Health   Financial Resource Strain: Not on file  Food Insecurity: Not on file  Transportation Needs: Not on file  Physical Activity: Not on file  Stress: Not on file  Social Connections: Not on file  Intimate Partner Violence: Not on file    Family History:   Family History  Problem Relation Age of Onset   COPD Mother    Hypertension Mother    Thyroid disease Mother    Heart disease Father    Hyperlipidemia Father    Diabetes Father    Heart disease Sister    Cancer Maternal Grandfather    Diabetes Paternal Grandmother    Breast cancer Neg Hx      ROS:  Please see the history of present illness.  All other ROS  reviewed and negative.     Physical Exam/Data:   Vitals:   10/05/21 2346 10/06/21 0017  10/06/21 0555 10/06/21 0759  BP:  130/70 128/72 130/77  Pulse:  84 73 79  Resp:   18 18  Temp:  98 F (36.7 C) 98.4 F (36.9 C) 98 F (36.7 C)  TempSrc:  Oral Oral Oral  SpO2:  100% 96% 99%  Weight: 101.1 kg  100.7 kg   Height: _0  (1.626 m)       Intake/Output Summary (Last 24 hours) at 10/06/2021 0910 Last data filed at 10/06/2021 0017 Gross per 24 hour  Intake 240 ml  Output --  Net 240 ml   Last 3 Weights 10/06/2021 10/05/2021 10/05/2021  Weight (lbs) 222 lb 222 lb 12.8 oz 201 lb 15.1 oz  Weight (kg) 100.699 kg 101.061 kg 91.6 kg     Body mass index is 38.11 kg/m.  General: 70 y.o. Caucasian female resting comfortably in no acute distress. HEENT: Normocephalic and atraumatic. Sclera clear.  Neck: Supple. No carotid bruits. No JVD. Heart: RRR. Distinct S1 and S2. No murmurs, gallops, or rubs. Radial and distal pedal pulses 2+ and equal bilaterally. Lungs: No increased work of breathing. Clear to ausculation bilaterally. No wheezes, rhonchi, or rales.  Abdomen: Soft, non-distended, and non-tender to palpation.  Extremities: No lower extremity edema.    Skin: Warm and dry. Neuro: Alert and oriented x3. No focal deficits. Psych: Normal affect. Responds appropriately.   EKG:  The following EKGs were personally reviewed and demonstrates:   - EKG from 10/05/2021 at 16:40: SVT, rate 193 bpm, with minimal ST depression in inferior leads. - EKG from 10/05/2021 at 17:02: Sinus tachycardia, rate 108 bpm, with no acute ST/T changes. - EKG from 10/06/2021 at 2:48: Normal sinus rhythm, rate 65 bpm, with no acute ST/T changes.  Telemetry:  Telemetry was personally reviewed and demonstrates:  Sinus rhythm with rates mostly in the 70s to 80s with a few spikes as high as the 130 (suspect this is with exertion) but no recurrent SVT.  Relevant CV Studies:  Echo Stress 08/17/2020: Interpretation: MILDLY ABNORMAL STRESS ECHOCARDIOGRAM.  NO DOPPLER PERFORMED FOR VALVULAR REGURGITATION  NO  VALVULAR STENOSIS   Note: TARGET HR NOT REACHED; ALTHOUGH SOME WALL MOTION ABNORMALITIES SEEN ON PEAK STRESS IMAGES ACQUIRED POST STRESS.   Maximum workload of  7.00 METs was achieved during exercise.  RESTING HYPERTENSION - APPROPRIATE RESPONSE  _______________  Echocardiogram 08/17/2021: NORMAL LEFT VENTRICULAR SYSTOLIC FUNCTION WITH MILD LVH  NORMAL RIGHT VENTRICULAR SYSTOLIC FUNCTION  VALVULAR REGURGITATION: TRIVIAL MR, TRIVIAL PR, TRIVIAL TR  NO VALVULAR STENOSIS   3D acquisition and reconstructions were performed as part of this examination to more accurately quantify the effects of identified  Structural abnormalities as part of the exam.   Laboratory Data:  High Sensitivity Troponin:   Recent Labs  Lab 10/05/21 1730 10/05/21 1840 10/06/21 0048 10/06/21 0223  TROPONINIHS 850* 4,520* 5,871* 5,744*     Chemistry Recent Labs  Lab 10/05/21 1640 10/06/21 0048  NA 140 141  K 5.4* 5.0  CL 110 114*  CO2 19* 20*  GLUCOSE 222* 125*  BUN 55* 51*  CREATININE 2.53* 2.25*  CALCIUM 9.8 9.1  MG 2.0  --   GFRNONAA 20* 23*  ANIONGAP 11 7    No results for input(s): PROT, ALBUMIN, AST, ALT, ALKPHOS, BILITOT in the last 168 hours. Lipids  Recent Labs  Lab 10/06/21 0048  CHOL 155  TRIG 338*  HDL 42  LDLCALC 45  CHOLHDL 3.7    Hematology Recent Labs  Lab 10/05/21 1640 10/06/21 0048  WBC 10.7* 8.7  RBC 4.54 3.92  HGB 14.1 12.4  HCT 43.9 38.4  MCV 96.7 98.0  MCH 31.1 31.6  MCHC 32.1 32.3  RDW 13.9 14.0  PLT 326 283   Thyroid  Recent Labs  Lab 10/06/21 0048  TSH 1.554    BNPNo results for input(s): BNP, PROBNP in the last 168 hours.  DDimer No results for input(s): DDIMER in the last 168 hours.   Radiology/Studies:  DG Chest Portable 1 View  Result Date: 10/05/2021 CLINICAL DATA:  Chest pain tachycardia EXAM: PORTABLE CHEST 1 VIEW COMPARISON:  06/23/2008 FINDINGS: No focal opacity or pleural effusion. Borderline cardiomegaly with aortic  atherosclerosis. No pneumothorax. IMPRESSION: Borderline to mild cardiomegaly. Electronically Signed   By: Donavan Foil M.D.   On: 10/05/2021 17:26     Assessment and Plan:   Paroxysmal SVT - Patient presented with diaphoresis and mild discomfort under left arm and left shoulder blade and found to be in SVT with rates in the 190s. Converted with Adenosine. No recurrence.  - Recent Echo in 07/2021 showed normal LV function. - On Coreg 12.58m twice daily at home. Increased to 233mtwice daily here. Agree with this increased dose. - [facility] Consider EP evaluation there.  Elevated Troponin - High-sensitivity troponin peaked at 5,871 and trending down.  - EKG showed very slight ST depression in inferior leads when rates were in the 190s but these resolved with rate control. - She reported mild discomfort under left arm and around left shoulder blade when in SVT but no chest pain. - She does have a [facility] due to CKD stage IV. Do not thinks this was an ACS event. Suspect demand ischemia in the setting of SVT. No further work-up necessary at this time. We can stop IV Heparin.  Hypertension - BP elevated this morning but she has not received morning medications yet.  - Continue Coreg as above.  Hyperlipidemia - Continue Lipitor 4018maily. Can also continue home Zetia 72m92mily.  Type 2 Diabetes Mellitus - Hemoglobin A1c 7.6 this admission. - Management per primary team.  CKD Stage IV - Creatinine 2.53 on admission. Improved to 2.25 after IV fluid which is around patient's baseline. - [facility]  Hyperkalemia - Potassium minimally elevated at 5.3 on admission.  - Stable at 5.0 today.    Risk Assessment/Risk Scores:   HEAR Score (for undifferentiated chest pain):  HEAR Score: 4{   For questions or  updates, please contact [facility] consult [email or web address] for contact info under    Signed, CallDarreld Mclean-C  10/06/2021 9:10 AM    I have seen, examined the patient, and reviewed the above assessment and plan.  Changes to above are made where necessary.  On exam, RRR.  The patient presented with adenosine sensitive short RP SVT.  This is her first episode. Elevated trop is due to demand ischemia. No recent anginal symptoms though she did have chest discomfort during SVT.  [facility].  Ok to discharge.  She is instructed to contact Dr BlazHorald Pollenice on Monday.  Cardiology team to see as needed while here. Please call with questions.  Co Sign: Thompson Grayer, MD 10/06/2021 9:25 AM

## 2021-10-06 NOTE — Care Management Obs Status (Signed)
[facility] NOTIFICATION   Patient Details  Name: [name] MRN: [id number] Date of Birth: 1951-10-31   Medicare Observation Status Notification Given:  Yes    Carles Collet, RN 10/06/2021, 11:00 AM

## 2021-10-06 NOTE — H&P (Signed)
History and Physical   [name] YYF:[id number] DOB: 10-Jul-1951 DOA: 10/05/2021  PCP: Elby Showers, MD   Patient coming from: Home  Chief Complaint: Chest pain and shortness of breath  HPI: [name] is a 70 y.o. female with medical history significant of CKD 4, history of thyroiditis, hyperlipidemia, diabetes, gout, hypertension, OSA, GERD presenting after episode of chest pain shortness of breath.  Patient reported having symptoms onset around 3 PM which was about an hour prior to her presentation in the ED.  She reported diaphoresis and chest pain as well as some associated shortness of breath.  Denied ever having anything similar.  On arrival she was noted to have heart rate in the 170s to 190s in the ED with SVT rhythm.  Reports some chronic mild SOB since having covid. Did have echo 2 weeks ago at [place]. She denies fevers, chills, abdominal pain, constipation, diarrhea, nausea, vomiting.  ED Course: Vital signs in the ED as above initially concerning for heart rate in the 170s to 190s with SVT rhythm adenosine was administered with improved heart rate to the normal range.  Lab work-up showed BMP with potassium 5.4, bicarb 19, BUN 55, creatinine elevated 2.53 from baseline around 2.2, glucose 222.  CBC with mild leukocytosis to 10.7.  PT, PTT, INR within normal limits.  Troponin elevated to 850 on first check and 4500 on repeat.  Rest were panel for flu and COVID-negative.  Chest x-ray showed mild cardiomegaly.  Patient started on heparin drip in addition to adenosine and IV fluids in the ED.  Also restarted home Coreg and statin as well as administering aspirin.  Cardiology was consulted and requested [facility] admission and plan to see the patient in the morning.  Review of Systems: As per HPI otherwise all other systems reviewed and are negative.  Past Medical History:  Diagnosis Date   Allergic rhinitis    Carbuncle, trunk    Carpal tunnel syndrome     Chronic kidney disease    Combined hyperlipidemia associated with type 2 diabetes mellitus (HCC)    Complication of anesthesia    Cyclical vomiting    Dependent edema    Diabetes mellitus type II    Diabetic autonomic neuropathy (HCC)    Diabetic peripheral neuropathy associated with type 2 diabetes mellitus (HCC)    Dyspepsia    Essential hypertension, benign    GERD (gastroesophageal reflux disease)    Goiter    Hypercholesteremia    Menopause    Obesity    OSA (obstructive sleep apnea) 09/23/2016   Osteopenia    PONV (postoperative nausea and vomiting)    Tachycardia    Type II or unspecified type diabetes mellitus without mention of complication, not stated as uncontrolled     Past Surgical History:  Procedure Laterality Date   CESAREAN SECTION  1983   COLONOSCOPY WITH PROPOFOL N/A 07/09/2018   Procedure: COLONOSCOPY WITH PROPOFOL;  Surgeon: Juanita Craver, MD;  Location: [facility];  Service: Endoscopy;  Laterality: N/A;   DILATION AND CURETTAGE OF UTERUS  1988   GANGLION CYST EXCISION  1959   rt wrist   POLYPECTOMY  07/09/2018   Procedure: POLYPECTOMY;  Surgeon: Juanita Craver, MD;  Location: [facility];  Service: Endoscopy;;    Social History  reports that she has never smoked. She has never used smokeless tobacco. She reports that she does not drink alcohol and does not use drugs.  Allergies  Allergen Reactions   Fish Oil Shortness  Of Breath    Other reaction(s): Unknown   Metformin     Other reaction(s): Other (See Comments), Unknown "Facial puffiness"  Pt stated her kidney specialist told her to add metformin to her drug allergies list.    Metformin And Related Other (See Comments)    KIDNEY FAILURE?   Fenofibrate     fever   Cephalexin Diarrhea and Nausea And Vomiting   Ciprocin-Fluocin-Procin [Fluocinolone]     Acute kidney failure   Ciprofloxacin Other (See Comments)    Kidney failure Other reaction(s): Unknown   Fenofibrate     Other reaction(s):  fever   Insulins Other (See Comments)    Migraines, nausea Other reaction(s): Other (See Comments), Unknown Migraines, nausea NOVLOG    Latex     Other reaction(s): rash   Niacin     Other reaction(s): Unknown   Orange Fruit [Citrus]    Ramipril Cough   Ramipril     Other reaction(s): cough   Sulfa Antibiotics Rash    Family History  Problem Relation Age of Onset   COPD Mother    Hypertension Mother    Thyroid disease Mother    Heart disease Father    Hyperlipidemia Father    Diabetes Father    Heart disease Sister    Cancer Maternal Grandfather    Diabetes Paternal Grandmother    Breast cancer Neg Hx   Reviewed on admission  Prior to Admission medications   Medication Sig Start Date End Date Taking? Authorizing Provider  Accu-Chek Softclix Lancets lancets See admin instructions.    [provider]  Ascorbic Acid (VITAMIN C) 500 MG CAPS Take 500 mg by mouth daily.    [provider]  aspirin 81 MG chewable tablet Chew 81 mg by mouth daily.    [provider]  atorvastatin (LIPITOR) 40 MG tablet Take 1 tablet by mouth daily. 02/06/21   [provider]  B-D UF III MINI PEN NEEDLES 31G X 5 MM MISC  01/15/20   [provider]  Blood Glucose Monitoring Suppl (ACCU-CHEK GUIDE ME) w/Device KIT See admin instructions. 03/09/20   [provider]  carvedilol (COREG) 12.5 MG tablet 1 tablet with food 02/14/21   [provider]  cholecalciferol (VITAMIN D) 1000 units tablet Take 1,000 Units by mouth 3 (three) times daily.    [provider]  Cholecalciferol 50 MCG (2000 UT) CAPS Take by mouth. 07/13/09   [provider]  Continuous Blood Gluc Sensor (FREESTYLE LIBRE 2 SENSOR) MISC See admin instructions. 09/25/20   [provider]  ezetimibe (ZETIA) 10 MG tablet 1 tablet 04/13/21   [provider]  febuxostat (ULORIC) 40 MG tablet 1 tablet    [provider]  furosemide (LASIX) 20 MG  tablet 1 tablet    [provider]  glucose blood (ONETOUCH VERIO) test strip Check blood sugar 3 x daily 10/26/14   Sherrlyn Hock, MD  insulin lispro (HUMALOG) 100 UNIT/ML KwikPen 3-5u 03/16/20   [provider]  insulin NPH Human (NOVOLIN N) 100 UNIT/ML injection 15-25u    [provider]  ketoconazole (NIZORAL) 2 % cream Apply 1 application topically daily. 03/09/20   Elby Showers, MD  loratadine (CLARITIN) 10 MG tablet Take 10 mg by mouth as needed.     [provider]  Multiple Vitamin (MULTIVITAMIN) capsule Take 1 capsule by mouth daily.    [provider]  nitroGLYCERIN (NITROSTAT) 0.4 MG SL tablet Place under the tongue. 11/09/20  11/09/21  [provider]  promethazine (PHENERGAN) 25 MG tablet Take 25 mg by mouth daily as needed for nausea.    [provider]  sodium bicarbonate 650 MG tablet 1 tablet    [provider]    Physical Exam: Vitals:   10/05/21 1901 10/05/21 1930 10/05/21 2039 10/06/21 0017  BP:  127/64 129/66 130/70  Pulse:  87 75 84  Resp:  (!) 28 (!) 23   Temp: 98.3 F (36.8 C)     TempSrc: Oral     SpO2:  99% 98%   Weight:      Height:       Physical Exam Constitutional:      General: She is not in acute distress.    Appearance: Normal appearance. She is obese.  HENT:     Head: Normocephalic and atraumatic.     Mouth/Throat:     Mouth: Mucous membranes are moist.     Pharynx: Oropharynx is clear.  Eyes:     Extraocular Movements: Extraocular movements intact.     Pupils: Pupils are equal, round, and reactive to light.  Cardiovascular:     Rate and Rhythm: Normal rate and regular rhythm.     Pulses: Normal pulses.     Heart sounds: Normal heart sounds.  Pulmonary:     Effort: Pulmonary effort is normal. No respiratory distress.     Breath sounds: Normal breath sounds.  Abdominal:     General: Bowel sounds are normal. There is no distension.     Palpations: Abdomen is soft.      Tenderness: There is no abdominal tenderness.  Musculoskeletal:        General: No swelling or deformity.  Skin:    General: Skin is warm and dry.  Neurological:     General: No focal deficit present.     Mental Status: Mental status is at baseline.   Labs on Admission: I have personally reviewed following labs and imaging studies  CBC: Recent Labs  Lab 10/05/21 1640  WBC 10.7*  NEUTROABS 7.6  HGB 14.1  HCT 43.9  MCV 96.7  PLT 993    Basic Metabolic Panel: Recent Labs  Lab 10/05/21 1640  NA 140  K 5.4*  CL 110  CO2 19*  GLUCOSE 222*  BUN 55*  CREATININE 2.53*  CALCIUM 9.8  MG 2.0    GFR: Estimated Creatinine Clearance: 22.6 mL/min (A) (by C-G formula based on SCr of 2.53 mg/dL (H)).  Liver Function Tests: No results for input(s): AST, ALT, ALKPHOS, BILITOT, PROT, ALBUMIN in the last 168 hours.  Urine analysis:    Component Value Date/Time   COLORURINE YELLOW 07/17/2008 0350   APPEARANCEUR CLOUDY (A) 07/17/2008 0350   LABSPEC 1.010 07/17/2008 0350   PHURINE 6.5 07/17/2008 0350   GLUCOSEU NEGATIVE 07/17/2008 0350   HGBUR LARGE (A) 07/17/2008 0350   BILIRUBINUR NEG 03/15/2021 1024   KETONESUR NEGATIVE 07/17/2008 0350   PROTEINUR Positive (A) 03/15/2021 1024   PROTEINUR NEGATIVE 07/17/2008 0350   UROBILINOGEN 0.2 03/15/2021 1024   UROBILINOGEN 0.2 07/17/2008 0350   NITRITE NEG 03/15/2021 1024   NITRITE NEGATIVE 07/17/2008 0350   LEUKOCYTESUR Negative 03/15/2021 1024    Radiological Exams on Admission: DG Chest Portable 1 View  Result Date: 10/05/2021 CLINICAL DATA:  Chest pain tachycardia EXAM: PORTABLE CHEST 1 VIEW COMPARISON:  06/23/2008 FINDINGS: No focal opacity or pleural effusion. Borderline cardiomegaly with aortic atherosclerosis. No pneumothorax. IMPRESSION: Borderline to mild cardiomegaly. Electronically Signed   By:  Donavan Foil M.D.   On: 10/05/2021 17:26    EKG: Independently reviewed.  SVT at 193 bpm with minimal T flattening and  aVL possible normal ST depression in lead II.  Assessment/Plan Principal Problem:   SVT (supraventricular tachycardia) (HCC) Active Problems:   Essential hypertension   Combined hyperlipidemia associated with type 2 diabetes mellitus (HCC)   OSA (obstructive sleep apnea)   NSTEMI (non-ST elevated myocardial infarction) (HCC)   Acute renal failure superimposed on stage 4 chronic kidney disease (HCC)  SVT Hyperlipidemia NSTEMI > Patient presenting with chest pain and shortness of breath noted to have heart rate in the 170s to 190s with SVT rhythm that resolved with adenosine administration. > Troponin elevated to 850 and then 4500 on repeat no ST changes on EKG representing NSTEMI likely secondary to demand from her SVT however will await cardiology's full evaluation and continue to trend troponin. > Cardiology was consulted in the ED and will see the patient in the morning they requested patient be admitted to [facility]. > Currently chest pain free. Follows with [place] cardiology. > Echo 2 weeks ago at [facility] showed normal LVEF and RVEF with some mild LVH and trace valvular abnormalities. - Appreciate cardiology recommendations, may need stress test. Other eval options somewhat limited given CKD4 - Continue with heparin drip - Trend troponin - Plan for morning lipid panel and A1c - EKG as needed - Nitro as needed - Continue home carvedilol and statin - Check TSH  AKI on CKD 4 Hyperkalemia > Presented with creatinine of 2.53 from baseline around 2.2.  Did receive 500 cc bolus in the ED. > Mild hyperkalemia with potassium 5.4 in ED normal magnesium. - Monitor response to 500 cc bolus with labs in the morning - Trend renal function electrolytes - Avoid nephrotoxic agents  Hypertension - Continue home carvedilol as above  Diabetes > On 20 units twice daily NPH - 15 units twice daily with SSI here   OSA  - CPAP  DVT prophylaxis: Heparin drip  Code Status:   Full  Family  Communication:  Husband and daughter updated at bedside Disposition Plan:   Patient is from:  Home  Anticipated DC to:  Home  Anticipated DC date:  To 3 days  Anticipated DC barriers: None  Consults called:  Cardiology consulted in the ED plan to see the patient in the morning per report.  Admission status:  Inpatient, progressive.  Severity of Illness: The appropriate patient status for this patient is INPATIENT. Inpatient status is judged to be reasonable and necessary in order to provide the required intensity of service to ensure the patient's safety. The patient's presenting symptoms, physical exam findings, and initial radiographic and laboratory data in the context of their chronic comorbidities is felt to place them at high risk for further clinical deterioration. Furthermore, it is not anticipated that the patient will be medically stable for discharge from the hospital within 2 midnights of admission. The following factors support the patient status of inpatient.   " The patient's presenting symptoms include tachycardia, chest pain, shortness of breath. " The worrisome physical exam findings include initially SVT, heart rate now improved.. " The initial radiographic and laboratory data are worrisome because of Lab work-up showed BMP with potassium 5.4, bicarb 19, BUN 55, creatinine elevated 2.53 from baseline around 2.2, glucose 222.  CBC with mild leukocytosis to 10.7.  PT, PTT, INR within normal limits.  Troponin elevated to 850 on first check and 4500 on repeat.  Rest were panel for flu and COVID-negative.  Chest x-ray showed mild cardiomegaly. " The chronic co-morbidities include CKD 4, history of thyroiditis, hyperlipidemia, diabetes, gout, hypertension, OSA, GERD.   * I certify that at the point of admission it is my clinical judgment that the patient will require inpatient hospital care spanning beyond 2 midnights from the point of admission due to high intensity of service, high  risk for further deterioration and high frequency of surveillance required.Marcelyn Bruins MD [facility]  How to contact the [facility] Attending or Consulting provider [place] or covering provider during after hours [place], for this patient?   Check the care team in [facility] and look for a) attending/consulting [facility] provider listed and b) the [facility] team listed Log into [email or web address] and use [place]'s universal password to access. If you do not have the password, please contact the hospital operator. Locate the [facility] provider you are looking for under [facility] and page to a number that you can be directly reached. If you still have difficulty reaching the provider, please page the [facility] (Director on Call) for the Hospitalists listed on amion for assistance.  10/06/2021, 12:20 AM

## 2021-10-06 NOTE — Progress Notes (Signed)
Date and time results received: 10/06/21 0200 Test: Troponin Critical Value: 5871 Name of Provider Notified: B. Chotiner Actions Taken?: 12 L EKG done

## 2021-10-06 NOTE — Plan of Care (Signed)

## 2021-10-06 NOTE — Progress Notes (Signed)
[place] for Heparin Indication: chest pain/ACS  Allergies  Allergen Reactions   Fish Oil Shortness Of Breath    Other reaction(s): Unknown   Metformin     Other reaction(s): Other (See Comments), Unknown "Facial puffiness"  Pt stated her kidney specialist told her to add metformin to her drug allergies list.    Metformin And Related Other (See Comments)    KIDNEY FAILURE?   Fenofibrate     fever   Cephalexin Diarrhea and Nausea And Vomiting   Ciprocin-Fluocin-Procin [Fluocinolone]     Acute kidney failure   Ciprofloxacin Other (See Comments)    Kidney failure Other reaction(s): Unknown   Fenofibrate     Other reaction(s): fever   Insulins Other (See Comments)    Migraines, nausea Other reaction(s): Other (See Comments), Unknown Migraines, nausea NOVLOG    Latex     Other reaction(s): rash   Niacin     Other reaction(s): Unknown   Orange Fruit [Citrus]    Ramipril Cough   Ramipril     Other reaction(s): cough   Sulfa Antibiotics Rash    Patient Measurements: Height: 5\' 4"  (162.6 cm) Weight: 101.1 kg (222 lb 12.8 oz) IBW/kg (Calculated) : 54.7 Heparin Dosing Weight: 72.3 kg  Vital Signs: Temp: 98 F (36.7 C) (10/08 0017) Temp Source: Oral (10/08 0017) BP: 130/70 (10/08 0017) Pulse Rate: 84 (10/08 0017)  Labs: Recent Labs    10/05/21 1640 10/05/21 1730 10/05/21 1840 10/05/21 1909 10/05/21 1910 10/06/21 0048  HGB 14.1  --   --   --   --  12.4  HCT 43.9  --   --   --   --  38.4  PLT 326  --   --   --   --  283  APTT  --   --   --  25  --   --   LABPROT  --  12.8  --   --   --   --   INR  --  1.0  --   --   --   --   HEPARINUNFRC  --   --   --   --  <0.10* 0.25*  CREATININE 2.53*  --   --   --   --  2.25*  TROPONINIHS  --  850* 4,520*  --   --  5,871*     Estimated Creatinine Clearance: 27.7 mL/min (A) (by C-G formula based on SCr of 2.25 mg/dL (H)).   Medical History: Past Medical History:  Diagnosis  Date   Allergic rhinitis    Carbuncle, trunk    Carpal tunnel syndrome    Chronic kidney disease    Combined hyperlipidemia associated with type 2 diabetes mellitus (HCC)    Complication of anesthesia    Cyclical vomiting    Dependent edema    Diabetes mellitus type II    Diabetic autonomic neuropathy (HCC)    Diabetic peripheral neuropathy associated with type 2 diabetes mellitus (HCC)    Dyspepsia    Essential hypertension, benign    GERD (gastroesophageal reflux disease)    Goiter    Hypercholesteremia    Menopause    Obesity    OSA (obstructive sleep apnea) 09/23/2016   Osteopenia    PONV (postoperative nausea and vomiting)    Tachycardia    Type II or unspecified type diabetes mellitus without mention of complication, not stated as uncontrolled     Medications:  Medications Prior to Admission  Medication Sig Dispense Refill Last Dose   Accu-Chek Softclix Lancets lancets See admin instructions.      Ascorbic Acid (VITAMIN C) 500 MG CAPS Take 500 mg by mouth daily.      aspirin 81 MG chewable tablet Chew 81 mg by mouth daily.      atorvastatin (LIPITOR) 40 MG tablet Take 1 tablet by mouth daily.      B-D UF III MINI PEN NEEDLES 31G X 5 MM MISC       Blood Glucose Monitoring Suppl (ACCU-CHEK GUIDE ME) w/Device KIT See admin instructions.      carvedilol (COREG) 12.5 MG tablet 1 tablet with food      cholecalciferol (VITAMIN D) 1000 units tablet Take 1,000 Units by mouth 3 (three) times daily.      Cholecalciferol 50 MCG (2000 UT) CAPS Take by mouth.      Continuous Blood Gluc Sensor (FREESTYLE LIBRE 2 SENSOR) MISC See admin instructions.      ezetimibe (ZETIA) 10 MG tablet 1 tablet      febuxostat (ULORIC) 40 MG tablet 1 tablet      furosemide (LASIX) 20 MG tablet 1 tablet      glucose blood (ONETOUCH VERIO) test strip Check blood sugar 3 x daily 100 each 6    insulin lispro (HUMALOG) 100 UNIT/ML KwikPen 3-5u      insulin NPH Human (NOVOLIN N) 100 UNIT/ML injection 15-25u       ketoconazole (NIZORAL) 2 % cream Apply 1 application topically daily. 15 g 11    loratadine (CLARITIN) 10 MG tablet Take 10 mg by mouth as needed.       Multiple Vitamin (MULTIVITAMIN) capsule Take 1 capsule by mouth daily.      nitroGLYCERIN (NITROSTAT) 0.4 MG SL tablet Place under the tongue.      promethazine (PHENERGAN) 25 MG tablet Take 25 mg by mouth daily as needed for nausea.      sodium bicarbonate 650 MG tablet 1 tablet       Scheduled:   adenosine       aspirin  81 mg Oral Daily   atorvastatin  40 mg Oral Daily   carvedilol  25 mg Oral BID WC   insulin aspart  0-6 Units Subcutaneous TID WC   insulin NPH Human  15 Units Subcutaneous BID AC & HS   Infusions:   heparin 850 Units/hr (10/05/21 1927)   PRN:   Assessment: 85 yof with a history of CKD, HLD, HTN. Patient is presenting with chest pain. Heparin per pharmacy consult placed for chest pain/ACS.  Patient is on not on anticoagulation prior to arrival.  Hgb14.1;plt 326  10/8 AM update:  Heparin level below goal Drawn a little early Trop 4520>>5871  Goal of Therapy:  Heparin level 0.3-0.7 units/ml Monitor platelets by anticoagulation protocol: Yes   Plan:  Inc heparin to 1000 units/hr 1000 heparin level  Narda Bonds, PharmD, BCPS Clinical Pharmacist Phone: [phone]

## 2021-10-06 NOTE — Care Management CC44 (Signed)
Condition Code 44 Documentation Completed  Patient Details  Name: Ginni Eichler MRN: [id number] Date of Birth: 02-26-1951   Condition Code 44 given:  Yes Patient signature on Condition Code 44 notice:  Yes Documentation of 2 MD's agreement:  Yes Code 44 added to claim:  Yes    Carles Collet, RN 10/06/2021, 11:00 AM

## 2021-10-08 ENCOUNTER — Telehealth: Payer: Self-pay | Admitting: Internal Medicine

## 2021-10-08 DIAGNOSIS — N184 Chronic kidney disease, stage 4 (severe): Secondary | ICD-10-CM | POA: Diagnosis not present

## 2021-10-08 NOTE — Telephone Encounter (Signed)
Scheduled Hospital follow up for 10/16/2021. Will you do the Telephone follow up required by Medicare after D/C ?  D/c 10/06/21

## 2021-10-08 NOTE — Telephone Encounter (Signed)
Called patient to follow up on brief hospitalization this weekend. She says she is feeling fine. D/C summary indicated  follow up labs needed today. I offered to see patient today and do labs. She says she is having labs with another provider today. Explained to her that her hospital visit was considered an admission by Medicare, and she may need follow up visit here in 1-2 weeks as per Discharge summary. She is agreeable to that. MJB,MD

## 2021-10-09 ENCOUNTER — Telehealth: Payer: Self-pay

## 2021-10-09 NOTE — Telephone Encounter (Signed)
TCM call completed.  

## 2021-10-09 NOTE — Telephone Encounter (Signed)
Transition Care Management Follow-up Telephone Call Date of discharge and from where: 10/03/21 From [facility] How have you been since you were released from the hospital? Doing well, no further problems. Any questions or concerns? No  Items Reviewed: Did the pt receive and understand the discharge instructions provided? Yes  Medications obtained and verified? Yes  Other? No  Any new allergies since your discharge? No  Dietary orders reviewed? Yes Do you have support at home? Yes   Home Care and Equipment/Supplies: Were home health services ordered? no If so, what is the name of the agency? N/a  Has the agency set up a time to come to the patient's home? not applicable Were any new equipment or medical supplies ordered?  No What is the name of the medical supply agency? none Were you able to get the supplies/equipment? not applicable Do you have any questions related to the use of the equipment or supplies? No  Functional Questionnaire: (I = Independent and D = Dependent) ADLs: I  Bathing/Dressing- I  Meal Prep- I  Eating- I   Maintaining continence- I  Transferring/Ambulation- I  Managing Meds- I  Follow up appointments reviewed:  PCP Hospital f/u appt confirmed? Yes  Scheduled to see Dr Renold Genta on 10/18 @ 12. Also lmom for cardiologist at [facility] f/u appt confirmed?  Waiting to hear from [place]  Are transportation arrangements needed? No  If their condition worsens, is the pt aware to call PCP or go to the Emergency Dept.? No Was the patient provided with contact information for the PCP's office or ED? Yes Was to pt encouraged to call back with questions or concerns? Yes

## 2021-10-16 ENCOUNTER — Ambulatory Visit: Payer: Medicare PPO | Admitting: Internal Medicine

## 2021-10-16 ENCOUNTER — Other Ambulatory Visit: Payer: Self-pay

## 2021-10-16 DIAGNOSIS — Z23 Encounter for immunization: Secondary | ICD-10-CM | POA: Diagnosis not present

## 2021-10-16 DIAGNOSIS — N184 Chronic kidney disease, stage 4 (severe): Secondary | ICD-10-CM | POA: Diagnosis not present

## 2021-10-16 NOTE — Progress Notes (Signed)
   Subjective:    Patient ID: [name], female    DOB: 1951/02/11, 70 y.o.   MRN: [id number]  HPI 70 year old Female seen for hospital follow up. Hospitalized briefly for SVT on October 7th. Has Cardiologist Dr. Edwin Dada at [facility]. Has appt in December at [facility].  She had COVID-19 in early September.  Case was mild.  She has stage IV chronic kidney disease.  She has type 2 diabetes mellitus managed by Dr. Chalmers Cater.  She has sleep apnea and history of gout.  She has a history of osteoporosis but has not wanted to be on therapy.  Dr. Lorrene Reid suggested Prolia but she declined.  History of tubular adenoma 2019 on colonoscopy with Dr. Collene Mares.  Patient says her dry weight is 212 pounds.  She has a history of GE reflux, allergic rhinitis, dependent edema, carpal tunnel syndrome in 2006, D&C 1998, C-section 1993, ganglion cyst removed from right wrist 1959.  See dictation from March 15, 2021 regarding etiology of chronic kidney disease beginning in 2007 with nephrolithiasis and obstruction at UPJ junction and an admission with acute renal failure in 2009 with creatinine up to 6.0 requiring placement of left ureteral stent.  Has had high potassium at times and has been told to watch foods containing potassium.  Sees Nephrologist in December. Labs obtained at [facility] in October 10th were sent to [facility].  Says she has spoken with Dr. Edwin Dada at [facility] and he will see her in December. He is aware of her recent admission. During her brief hospitalization which involved being monitored overnight.  Coreg was increased.  NSTEMI ruled out and elevated troponin was felt to be due to demand mismatch due to tachycardia.  She was started on a heparin drip and adenosine was given in ED.  On recent  labs at [facility] done for  upcoming [facility] appt,, patient had serum creatinine 2.26 and K was 5.5. Phosphorus was at upper limits of normal. Repeated labs today.  Results of these  labs repeated today were creatinine of 2.20 which is around her baseline.  Phosphorus was 4.5 with normal being 4.3 at the upper limits of normal.  Review of Systems no new complaints. Says pulse ox stable after Covid-19 but still has some cough.     Objective:   Physical Exam Blood pressure 132/62 pulse 74 respiratory rate 16 temperature 98 degrees orally pulse oximetry 98% weight 202 pounds BMI 34.67  Skin: Warm and dry.  No carotid bruits.  No thyromegaly.  Chest is clear.  Cardiac exam regular rate and rhythm normal S1 and S2 no lower extremity pitting edema.       Assessment & Plan:  SVT treated with adenosine  and increased dose of Coreg from 12.5 mg twice daily to 25 mg twice daily.  Is to follow-up with Dr. Edwin Dada at [facility].  Has upcoming appointment with [facility].  Phosphorus has been slightly elevated.  Continue to monitor.  CPE scheduled for March 2023.  Flu vaccine given.

## 2021-10-16 NOTE — Patient Instructions (Addendum)
Encourage follow-up with Cardiologist at [facility] regarding episode of supraventricular tachycardia.  You have upcoming appointment with [facility].  Phosphorus was elevated in the hospital and is also slightly elevated today.  Dose of Coreg was increased in the hospital by Cardiologist.  Flu vaccine given today.

## 2021-10-20 LAB — COMPREHENSIVE METABOLIC PANEL
AG Ratio: 1.4 (calc) (ref 1.0–2.5)
ALT: 20 U/L (ref 6–29)
AST: 18 U/L (ref 10–35)
Albumin: 3.8 g/dL (ref 3.6–5.1)
Alkaline phosphatase (APISO): 138 U/L (ref 37–153)
BUN/Creatinine Ratio: 23 (calc) — ABNORMAL HIGH (ref 6–22)
BUN: 51 mg/dL — ABNORMAL HIGH (ref 7–25)
CO2: 24 mmol/L (ref 20–32)
Calcium: 9.7 mg/dL (ref 8.6–10.4)
Chloride: 112 mmol/L — ABNORMAL HIGH (ref 98–110)
Creat: 2.2 mg/dL — ABNORMAL HIGH (ref 0.50–1.05)
Globulin: 2.7 g/dL (calc) (ref 1.9–3.7)
Glucose, Bld: 105 mg/dL — ABNORMAL HIGH (ref 65–99)
Potassium: 5.6 mmol/L — ABNORMAL HIGH (ref 3.5–5.3)
Sodium: 144 mmol/L (ref 135–146)
Total Bilirubin: 0.4 mg/dL (ref 0.2–1.2)
Total Protein: 6.5 g/dL (ref 6.1–8.1)

## 2021-10-20 LAB — PHOSPHORUS: Phosphorus: 4.5 mg/dL — ABNORMAL HIGH (ref 2.1–4.3)

## 2021-10-20 LAB — TEST AUTHORIZATION

## 2021-10-23 DIAGNOSIS — G4733 Obstructive sleep apnea (adult) (pediatric): Secondary | ICD-10-CM | POA: Diagnosis not present

## 2021-10-28 ENCOUNTER — Encounter: Payer: Self-pay | Admitting: Internal Medicine

## 2021-11-02 ENCOUNTER — Other Ambulatory Visit: Payer: Self-pay

## 2021-11-02 ENCOUNTER — Ambulatory Visit: Payer: Medicare PPO | Admitting: Podiatry

## 2021-11-02 ENCOUNTER — Encounter: Payer: Self-pay | Admitting: Podiatry

## 2021-11-02 DIAGNOSIS — M79674 Pain in right toe(s): Secondary | ICD-10-CM

## 2021-11-02 DIAGNOSIS — L84 Corns and callosities: Secondary | ICD-10-CM | POA: Diagnosis not present

## 2021-11-02 DIAGNOSIS — Z794 Long term (current) use of insulin: Secondary | ICD-10-CM

## 2021-11-02 DIAGNOSIS — E1122 Type 2 diabetes mellitus with diabetic chronic kidney disease: Secondary | ICD-10-CM

## 2021-11-02 DIAGNOSIS — B351 Tinea unguium: Secondary | ICD-10-CM | POA: Diagnosis not present

## 2021-11-02 DIAGNOSIS — M79675 Pain in left toe(s): Secondary | ICD-10-CM | POA: Diagnosis not present

## 2021-11-02 DIAGNOSIS — N184 Chronic kidney disease, stage 4 (severe): Secondary | ICD-10-CM

## 2021-11-06 NOTE — Progress Notes (Signed)
Subjective:  Patient ID: [name], female    DOB: August 31, 1951,  MRN: [id number]  70 y.o. female presents with at risk foot care. Pt has h/o NIDDM with chronic kidney disease and callus(es) left foot and painful thick toenails that are difficult to trim. Painful toenails interfere with ambulation. Aggravating factors include wearing enclosed shoe gear. Pain is relieved with periodic professional debridement. Painful calluses are aggravated when weightbearing with and without shoegear. Pain is relieved with periodic professional debridement..    Patient did not check blood glucose this morning.  PCP: Elby Showers, MD and last visit was: 10/16/2021.  Review of Systems: Negative except as noted in the HPI.   Allergies  Allergen Reactions   Fish Oil Shortness Of Breath    Other reaction(s): Unknown   Metformin     Other reaction(s): Other (See Comments), Unknown "Facial puffiness"  Pt stated her kidney specialist told her to add metformin to her drug allergies list.    Metformin And Related Other (See Comments)    KIDNEY FAILURE?   Fenofibrate     fever   Cephalexin Diarrhea and Nausea And Vomiting   Ciprocin-Fluocin-Procin [Fluocinolone]     Acute kidney failure   Ciprofloxacin Other (See Comments)    Kidney failure Other reaction(s): Unknown   Fenofibrate     Other reaction(s): fever   Insulins Other (See Comments)    Migraines, nausea Other reaction(s): Other (See Comments), Unknown Migraines, nausea NOVLOG    Latex     Other reaction(s): rash   Niacin     Other reaction(s): Unknown   Orange Fruit [Citrus]    Ramipril Cough   Ramipril     Other reaction(s): cough   Sulfa Antibiotics Rash    Objective:  There were no vitals filed for this visit. Constitutional Patient is a pleasant 70 y.o. Caucasian female in NAD. AAO x 3.  Vascular Capillary fill time to digits immediate b/l.  DP/PT pulse(s) are palpable b/l lower extremities. Pedal hair sparse. Lower  extremity skin temperature gradient within normal limits. No pain with calf compression b/l. No cyanosis or clubbing noted.   Neurologic Protective sensation intact 5/5 intact bilaterally with 10g monofilament b/l. Vibratory sensation intact b/l. No clonus b/l.   Dermatologic Pedal skin is warm and supple b/l.  No open wounds b/l lower extremities. No interdigital macerations b/l lower extremities. Toenails 1-5 b/l elongated, discolored, dystrophic, thickened, crumbly with subungual debris and tenderness to dorsal palpation. Hyperkeratotic lesion(s) submet head 1 left foot.  No erythema, no edema, no drainage, no fluctuance.  Orthopedic: Normal muscle strength 5/5 to all lower extremity muscle groups bilaterally. Patient ambulates independent of any assistive aids. HAV with bunion deformity noted b/l LE.   Hemoglobin A1C Latest Ref Rng & Units 10/06/2021 06/26/2021 03/09/2021  HGBA1C 4.8 - 5.6 % 7.6(H) 6.8(H) 6.8(H)  Some recent data might be hidden   Assessment:   1. Pain due to onychomycosis of toenails of both feet   2. Callus   3. Type 2 diabetes mellitus with stage 4 chronic kidney disease, with long-term current use of insulin ([place])    Plan:  Patient was evaluated and treated and all questions answered. Consent given for treatment as described below: -No new findings. No new orders. -Continue diabetic foot care principles: inspect feet daily, monitor glucose as recommended by PCP and/or Endocrinologist, and follow prescribed diet per PCP, Endocrinologist and/or dietician. -Toenails 1-5 left and 1-5 right debrided in length and girth without iatrogenic bleeding with  sterile nail nipper and dremel.  -Callus(es) submet head 1 left foot pared utilizing sterile scalpel blade without complication or incident. Total number debrided =1. -Patient/POA to call should there be question/concern in the interim.  Return in about 3 months (around 02/02/2022).  Marzetta Board, DPM

## 2021-11-27 ENCOUNTER — Other Ambulatory Visit: Payer: Self-pay

## 2021-11-27 ENCOUNTER — Encounter: Payer: Self-pay | Admitting: Pulmonary Disease

## 2021-11-27 ENCOUNTER — Ambulatory Visit: Payer: Medicare PPO | Admitting: Pulmonary Disease

## 2021-11-27 VITALS — BP 132/78 | HR 84 | Temp 97.9°F | Ht 64.0 in | Wt 225.4 lb

## 2021-11-27 DIAGNOSIS — Z8616 Personal history of COVID-19: Secondary | ICD-10-CM

## 2021-11-27 DIAGNOSIS — R0609 Other forms of dyspnea: Secondary | ICD-10-CM

## 2021-11-27 DIAGNOSIS — G4733 Obstructive sleep apnea (adult) (pediatric): Secondary | ICD-10-CM | POA: Diagnosis not present

## 2021-11-27 DIAGNOSIS — Z9989 Dependence on other enabling machines and devices: Secondary | ICD-10-CM | POA: Diagnosis not present

## 2021-11-27 NOTE — Patient Instructions (Signed)
Will have [employer] arrange for a new auto CPAP machine  Follow up in 4 to 5 months

## 2021-11-27 NOTE — Progress Notes (Signed)
[name], Critical Care, and Sleep Medicine  Chief Complaint  Patient presents with   Follow-up    Patient wants to talk about shortness of breath    Constitutional:  BP 132/78 (BP Location: Left Arm, Patient Position: Sitting, Cuff Size: Large)   Pulse 84   Temp 97.9 F (36.6 C) (Oral)   Ht _0  (1.626 m)   Wt 225 lb 6.4 oz (102.2 kg)   SpO2 97%   BMI 38.69 kg/m   Past Medical History:  Allergic rhinitis, Carpal tunnel, CKD, HLD, DM type 2, Neuropathy, HTN, GERD, Goiter, HLD, Osteopenia, CKD 4, COVID 19 in February 2021 and August 2022  Past Surgical History:  Her  has a past surgical history that includes Ganglion cyst excision (1959); Cesarean section (1983); Dilation and curettage of uterus (1988); Colonoscopy with propofol (N/A, 07/09/2018); and polypectomy (07/09/2018).  Brief Summary:  [name] is a 70 y.o. female with obstructive sleep apnea.      Subjective:   She had COVID again in August.  Has been feeling more short of breath with exertion since.  She is not having cough, wheeze, or chest congestion.  Seen by cardiology at [facility] recently and unrevealing.  Chest xray from 10/05/21 showed mild cardiomegaly.  Uses CPAP nightly.  No issues with mask fit.  Not having sinus congestion, dry mouth, or sore throat.  Physical Exam:   Appearance - well kempt   ENMT - no sinus tenderness, no oral exudate, no LAN, Mallampati 2 airway, no stridor  Respiratory - equal breath sounds bilaterally, no wheezing or rales  CV - s1s2 regular rate and rhythm, no murmurs  Ext - no clubbing, no edema  Skin - no rashes  Psych - normal mood and affect   Sleep Tests:  HST 09/12/16 >> AHI 30.1, SaO2 low 74% Auto CPAP 10/27/21 to 11/25/21 >> used on 30 of 30 nights with average 7 hrs 56 min.  Average AHI 0.9 with median CPAP 8 and 95 th percentile CPAP 12 cm H2O  Cardiac Tests:  Echo 08/17/20 >> normal EF, mild LVH  Social History:  She  reports that  she has never smoked. She has never used smokeless tobacco. She reports that she does not drink alcohol and does not use drugs.  Family History:  Her family history includes COPD in her mother; Cancer in her maternal grandfather; Diabetes in her father and paternal grandmother; Heart disease in her father and sister; Hyperlipidemia in her father; Hypertension in her mother; Thyroid disease in her mother.     Assessment/Plan:   Obstructive sleep apnea. - she is compliant with CPAP and reports benefit - she uses [employer] for her DME - her device is more than 68 yrs old - will arrange for new auto CPAP at 5 to 15 cm H2O  Dyspnea on exertion. - suspect related to after effects of COVID infection from August 2022 - defer additional Moore testing for now unless symptoms progress  Time Spent Involved in Patient Care on Day of Examination:  32 minutes  Follow up:   Patient Instructions  Will have [employer] arrange for a new auto CPAP machine  Follow up in 4 to 5 months  Medication List:   Allergies as of 11/27/2021       Reactions   Fish Oil Shortness Of Breath   Other reaction(s): Unknown   Metformin    Other reaction(s): Other (See Comments), Unknown "Facial puffiness" Pt stated her kidney specialist told her  to add metformin to her drug allergies list.   Metformin And Related Other (See Comments)   KIDNEY FAILURE?   Fenofibrate    fever   Cephalexin Diarrhea, Nausea And Vomiting   Ciprocin-fluocin-procin [fluocinolone]    Acute kidney failure   Ciprofloxacin Other (See Comments)   Kidney failure Other reaction(s): Unknown   Fenofibrate    Other reaction(s): fever   Insulins Other (See Comments)   Migraines, nausea Other reaction(s): Other (See Comments), Unknown Migraines, nausea NOVLOG   Latex    Other reaction(s): rash   Niacin    Other reaction(s): Unknown   Orange Fruit [citrus]    Ramipril Cough   Ramipril    Other reaction(s): cough   Sulfa Antibiotics  Rash        Medication List        Accurate as of November 27, 2021  2:37 PM. If you have any questions, ask your nurse or doctor.          Accu-Chek Guide Me w/Device Kit See admin instructions.   Accu-Chek Softclix Lancets lancets See admin instructions.   aspirin 81 MG chewable tablet Chew 81 mg by mouth daily.   atorvastatin 40 MG tablet Commonly known as: LIPITOR Take 1 tablet by mouth daily.   B-D UF III MINI PEN NEEDLES 31G X 5 MM Misc Generic drug: Insulin Pen Needle   carvedilol 25 MG tablet Commonly known as: COREG Take 1 tablet (25 mg total) by mouth 2 (two) times daily with a meal.   cholecalciferol 1000 units tablet Commonly known as: VITAMIN D Take 1,000 Units by mouth 3 (three) times daily.   ezetimibe 10 MG tablet Commonly known as: ZETIA 1 tablet   febuxostat 40 MG tablet Commonly known as: ULORIC 1 tablet   FreeStyle Libre 2 Sensor Misc See admin instructions.   furosemide 20 MG tablet Commonly known as: LASIX 1 tablet   glucose blood test strip Commonly known as: OneTouch Verio Check blood sugar 3 x daily   insulin lispro 100 UNIT/ML KwikPen Commonly known as: HUMALOG 3-5u   insulin NPH Human 100 UNIT/ML injection Commonly known as: NOVOLIN N 27 in am and pm   ketoconazole 2 % cream Commonly known as: NIZORAL Apply 1 application topically daily.   loratadine 10 MG tablet Commonly known as: CLARITIN Take 10 mg by mouth as needed.   multivitamin capsule Take 1 capsule by mouth daily.   nitroGLYCERIN 0.4 MG SL tablet Commonly known as: NITROSTAT Place under the tongue.   promethazine 25 MG tablet Commonly known as: PHENERGAN Take 25 mg by mouth daily as needed for nausea.   sodium bicarbonate 650 MG tablet 1 tablet   Vitamin C 500 MG Caps Take 500 mg by mouth daily.        Signature:  [name], MD [employer] Pager - [phone] 11/27/2021, 2:37 PM

## 2021-12-06 ENCOUNTER — Other Ambulatory Visit (HOSPITAL_BASED_OUTPATIENT_CLINIC_OR_DEPARTMENT_OTHER): Payer: Self-pay

## 2021-12-06 ENCOUNTER — Ambulatory Visit: Payer: Medicare PPO | Attending: Internal Medicine

## 2021-12-06 DIAGNOSIS — Z23 Encounter for immunization: Secondary | ICD-10-CM

## 2021-12-06 MED ORDER — PFIZER COVID-19 VAC BIVALENT 30 MCG/0.3ML IM SUSP
INTRAMUSCULAR | 0 refills | Status: DC
  Filled 2021-12-06: qty 0.3, 1d supply, fill #0

## 2021-12-06 NOTE — Progress Notes (Signed)
   Covid-19 Vaccination Clinic  Name:  Germani Gavilanes    MRN: [id number] DOB: 12-May-1951  12/06/2021  Ms. Kirkey was observed post Covid-19 immunization for 15 minutes without incident. She was provided with Vaccine Information Sheet and instruction to access the V-Safe system.   Ms. Andreasen was instructed to call 911 with any severe reactions post vaccine: Difficulty breathing  Swelling of face and throat  A fast heartbeat  A bad rash all over body  Dizziness and weakness   Immunizations Administered     Name Date Dose VIS Date Route   Pfizer Covid-19 Vaccine Bivalent Booster 12/06/2021  2:50 PM 0.3 mL 08/29/2021 Intramuscular   Manufacturer: [place]   Lot: [id number]   [place]: [phone]

## 2021-12-07 DIAGNOSIS — N184 Chronic kidney disease, stage 4 (severe): Secondary | ICD-10-CM | POA: Diagnosis not present

## 2021-12-10 DIAGNOSIS — I129 Hypertensive chronic kidney disease with stage 1 through stage 4 chronic kidney disease, or unspecified chronic kidney disease: Secondary | ICD-10-CM | POA: Diagnosis not present

## 2021-12-10 DIAGNOSIS — E1122 Type 2 diabetes mellitus with diabetic chronic kidney disease: Secondary | ICD-10-CM | POA: Diagnosis not present

## 2021-12-10 DIAGNOSIS — N179 Acute kidney failure, unspecified: Secondary | ICD-10-CM | POA: Diagnosis not present

## 2021-12-10 DIAGNOSIS — N2589 Other disorders resulting from impaired renal tubular function: Secondary | ICD-10-CM | POA: Diagnosis not present

## 2021-12-10 DIAGNOSIS — E875 Hyperkalemia: Secondary | ICD-10-CM | POA: Diagnosis not present

## 2021-12-10 DIAGNOSIS — M65322 Trigger finger, left index finger: Secondary | ICD-10-CM | POA: Diagnosis not present

## 2021-12-10 DIAGNOSIS — N184 Chronic kidney disease, stage 4 (severe): Secondary | ICD-10-CM | POA: Diagnosis not present

## 2021-12-10 DIAGNOSIS — N2581 Secondary hyperparathyroidism of renal origin: Secondary | ICD-10-CM | POA: Diagnosis not present

## 2021-12-10 DIAGNOSIS — E785 Hyperlipidemia, unspecified: Secondary | ICD-10-CM | POA: Diagnosis not present

## 2021-12-28 ENCOUNTER — Other Ambulatory Visit: Payer: Self-pay | Admitting: Internal Medicine

## 2021-12-28 DIAGNOSIS — Z1231 Encounter for screening mammogram for malignant neoplasm of breast: Secondary | ICD-10-CM

## 2022-01-28 ENCOUNTER — Telehealth: Payer: Self-pay | Admitting: Pulmonary Disease

## 2022-01-28 DIAGNOSIS — G4733 Obstructive sleep apnea (adult) (pediatric): Secondary | ICD-10-CM

## 2022-01-28 NOTE — Telephone Encounter (Signed)
Spoke to patient.  Patient stated that she received a new cpap machine recently and she has not been able to track you data. She called [employer] about the issue and was told that her machine does not modem. She would like an order placed to [employer] for modem.  Dr. Halford Chessman, please advise. thanks

## 2022-01-29 NOTE — Telephone Encounter (Signed)
Lm for patient.  

## 2022-01-29 NOTE — Telephone Encounter (Signed)
Okay to place order to get a modem set up for her CPAP.

## 2022-01-29 NOTE — Telephone Encounter (Signed)
Order placed for modem.  Patient is aware and voiced her understanding.  Nothing further needed.

## 2022-01-29 NOTE — Telephone Encounter (Signed)
Patient is returning phone call. Patient phone number is [phone].

## 2022-01-31 ENCOUNTER — Telehealth: Payer: Self-pay | Admitting: Pulmonary Disease

## 2022-01-31 NOTE — Telephone Encounter (Signed)
Called and spoke with patient. She stated that she has been struggling to get [employer] to give her a cpap machine with a modem or a modem for her machine. She wanted to see if she could get a copy of the prescription. I advised her that I could not email a copy of the order but I could print it out and place it up front for pickup. She verbalized understanding and will stop by the office tomorrow.   Nothing further needed at time of call.

## 2022-02-04 ENCOUNTER — Telehealth: Payer: Self-pay

## 2022-02-04 ENCOUNTER — Ambulatory Visit: Payer: Medicare PPO | Admitting: Internal Medicine

## 2022-02-04 ENCOUNTER — Encounter: Payer: Self-pay | Admitting: Internal Medicine

## 2022-02-04 ENCOUNTER — Other Ambulatory Visit: Payer: Self-pay

## 2022-02-04 VITALS — BP 104/62 | HR 64 | Temp 98.2°F | Wt 223.2 lb

## 2022-02-04 DIAGNOSIS — E669 Obesity, unspecified: Secondary | ICD-10-CM | POA: Diagnosis not present

## 2022-02-04 DIAGNOSIS — I10 Essential (primary) hypertension: Secondary | ICD-10-CM

## 2022-02-04 DIAGNOSIS — Z8739 Personal history of other diseases of the musculoskeletal system and connective tissue: Secondary | ICD-10-CM

## 2022-02-04 DIAGNOSIS — N184 Chronic kidney disease, stage 4 (severe): Secondary | ICD-10-CM

## 2022-02-04 DIAGNOSIS — M25561 Pain in right knee: Secondary | ICD-10-CM | POA: Diagnosis not present

## 2022-02-04 DIAGNOSIS — R319 Hematuria, unspecified: Secondary | ICD-10-CM

## 2022-02-04 DIAGNOSIS — M65322 Trigger finger, left index finger: Secondary | ICD-10-CM | POA: Diagnosis not present

## 2022-02-04 DIAGNOSIS — M1A09X0 Idiopathic chronic gout, multiple sites, without tophus (tophi): Secondary | ICD-10-CM | POA: Diagnosis not present

## 2022-02-04 DIAGNOSIS — Z6838 Body mass index (BMI) 38.0-38.9, adult: Secondary | ICD-10-CM | POA: Diagnosis not present

## 2022-02-04 LAB — CBC WITH DIFFERENTIAL/PLATELET
Absolute Monocytes: 714 cells/uL (ref 200–950)
Basophils Absolute: 52 cells/uL (ref 0–200)
Basophils Relative: 0.6 %
Eosinophils Absolute: 344 cells/uL (ref 15–500)
Eosinophils Relative: 4 %
HCT: 42 % (ref 35.0–45.0)
Hemoglobin: 14.2 g/dL (ref 11.7–15.5)
Lymphs Abs: 1402 cells/uL (ref 850–3900)
MCH: 31 pg (ref 27.0–33.0)
MCHC: 33.8 g/dL (ref 32.0–36.0)
MCV: 91.7 fL (ref 80.0–100.0)
MPV: 9.4 fL (ref 7.5–12.5)
Monocytes Relative: 8.3 %
Neutro Abs: 6089 cells/uL (ref 1500–7800)
Neutrophils Relative %: 70.8 %
Platelets: 314 10*3/uL (ref 140–400)
RBC: 4.58 10*6/uL (ref 3.80–5.10)
RDW: 13.7 % (ref 11.0–15.0)
Total Lymphocyte: 16.3 %
WBC: 8.6 10*3/uL (ref 3.8–10.8)

## 2022-02-04 LAB — COMPLETE METABOLIC PANEL WITH GFR
AG Ratio: 1.3 (calc) (ref 1.0–2.5)
ALT: 23 U/L (ref 6–29)
AST: 22 U/L (ref 10–35)
Albumin: 4 g/dL (ref 3.6–5.1)
Alkaline phosphatase (APISO): 139 U/L (ref 37–153)
BUN/Creatinine Ratio: 20 (calc) (ref 6–22)
BUN: 48 mg/dL — ABNORMAL HIGH (ref 7–25)
CO2: 20 mmol/L (ref 20–32)
Calcium: 9.7 mg/dL (ref 8.6–10.4)
Chloride: 108 mmol/L (ref 98–110)
Creat: 2.39 mg/dL — ABNORMAL HIGH (ref 0.50–1.05)
Globulin: 3.1 g/dL (calc) (ref 1.9–3.7)
Glucose, Bld: 153 mg/dL — ABNORMAL HIGH (ref 65–99)
Potassium: 5.5 mmol/L — ABNORMAL HIGH (ref 3.5–5.3)
Sodium: 141 mmol/L (ref 135–146)
Total Bilirubin: 0.5 mg/dL (ref 0.2–1.2)
Total Protein: 7.1 g/dL (ref 6.1–8.1)
eGFR: 22 mL/min/{1.73_m2} — ABNORMAL LOW (ref >=60)

## 2022-02-04 LAB — POCT URINALYSIS DIPSTICK
Bilirubin, UA: NEGATIVE
Glucose, UA: NEGATIVE
Ketones, UA: NEGATIVE
Nitrite, UA: NEGATIVE
Protein, UA: POSITIVE — AB
Spec Grav, UA: 1.015 (ref 1.010–1.025)
Urobilinogen, UA: 0.2 E.U./dL
pH, UA: 6 (ref 5.0–8.0)

## 2022-02-04 LAB — URIC ACID: Uric Acid, Serum: 5.1 mg/dL (ref 2.5–7.0)

## 2022-02-04 NOTE — Progress Notes (Signed)
° °  Subjective:    Patient ID: [name], female    DOB: 10/01/1951, 71 y.o.   MRN: [id number]  HPI Patient here for evaluation of hematuria with onset this morning. She has been seen by Dr. Alinda Money  at [facility] in 2018 and had cystoscopy at that time.  Today says she had hematuria. Specimen we collected today is yellow and turbid without blood and has large leukocytes. Also she has order from Dr. Amil Amen to have uric acid drawn today as well. We have drawn today CBC with differential and c-Met.  Patient requested we draw uric acid here today.  She has a history of gout seen by Dr. Amil Amen.  Uric acid  was normal at 5.1.  Creatinine is 2.39, BUN 48, potassium 5.5.  Results were faxed to [facility].    Review of Systems does not have nausea vomiting or significant back pain     Objective:   Physical Exam  No CVA tenderness.  Blood pressure 104/62 temperature 98.2 degrees pulse oximetry 96% weight 223 pounds 4 ounces  Dipstick UA shows color to be yellow with a tinge of red.  Clarity is turbid.  3+ occult blood present.  Protein is present.  Large LE.  Culture proved to be negative for infection.  Urine microscopic showed 10-20 white blood cells per high-powered field and a few bacteria with 0-2 red blood cells per high-powered field.    Assessment & Plan:   Patient reported hematuria and not vaginal bleeding.  Culture subsequently proved to be negative but during the visit it was decided to treat her for possible UTI and she was prescribed cephalexin 250 mg twice daily for 7 days.  If symptoms persist, she should see Dr. Alinda Money her urologist  Time spent with chart review, medical decision making, E scribing medication, reviewing lab results and faxing results to various providers he is 35 minutes

## 2022-02-04 NOTE — Telephone Encounter (Signed)
Patient states that she has blood in her urine. She is having surgery tomorrow. She is asking to drop off sample of urine since she has not been to urologist in 3 years. I have made her an appt.

## 2022-02-04 NOTE — Telephone Encounter (Signed)
Appt made

## 2022-02-05 ENCOUNTER — Telehealth: Payer: Self-pay

## 2022-02-05 LAB — URINE CULTURE
MICRO NUMBER:: 12967805
Result:: NO GROWTH
SPECIMEN QUALITY:: ADEQUATE

## 2022-02-05 LAB — URINALYSIS, MICROSCOPIC ONLY
Hyaline Cast: NONE SEEN /LPF
Squamous Epithelial / HPF: NONE SEEN /HPF (ref <=5)

## 2022-02-05 MED ORDER — CEPHALEXIN 250 MG PO CAPS: 250.0000 mg | ORAL_CAPSULE | Freq: Two times a day (BID) | ORAL | 0 refills | Status: DC

## 2022-02-05 NOTE — Telephone Encounter (Signed)
Patient states that there was going to be medicine sent in for her yesterday but there was nothing sent. Please advise.

## 2022-02-07 ENCOUNTER — Other Ambulatory Visit: Payer: Self-pay

## 2022-02-07 ENCOUNTER — Ambulatory Visit: Payer: Medicare PPO

## 2022-02-07 NOTE — Progress Notes (Signed)
Results have been relayed to the patient. The patient verbalized understanding. No questions at this time.   

## 2022-02-08 ENCOUNTER — Ambulatory Visit
Admission: RE | Admit: 2022-02-08 | Discharge: 2022-02-08 | Disposition: A | Discharge status: Home / Self Care | Payer: Medicare PPO | Source: Ambulatory Visit | Attending: Internal Medicine | Admitting: Internal Medicine

## 2022-02-08 DIAGNOSIS — Z1231 Encounter for screening mammogram for malignant neoplasm of breast: Secondary | ICD-10-CM | POA: Diagnosis not present

## 2022-02-08 IMAGING — MG MM DIGITAL SCREENING BILAT W/ TOMO AND CAD
8 series · 8 of 24 positions shown · non-contrast
Comparison: Previous exam(s).

ACR Breast Density Category a: The breast tissue is almost entirely
fatty.

CLINICAL DATA: Screening.

EXAM:
DIGITAL SCREENING BILATERAL MAMMOGRAM WITH TOMOSYNTHESIS AND CAD
TECHNIQUE: Bilateral screening digital craniocaudal and mediolateral oblique
mammograms were obtained. Bilateral screening digital breast
tomosynthesis was performed. The images were evaluated with
computer-aided detection.

[L MLO synth-2D]
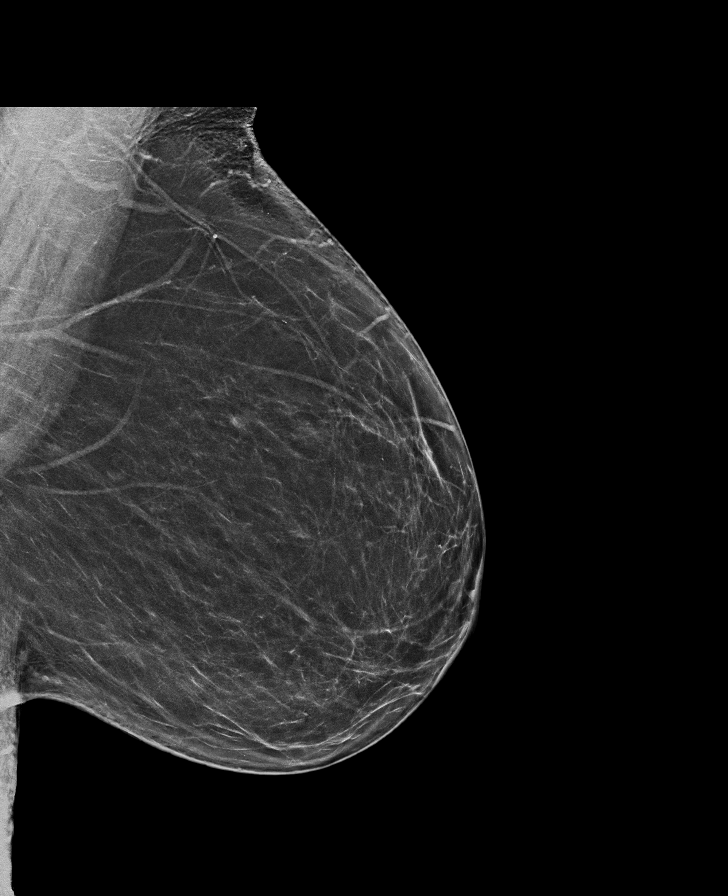

[R MLO synth-2D]
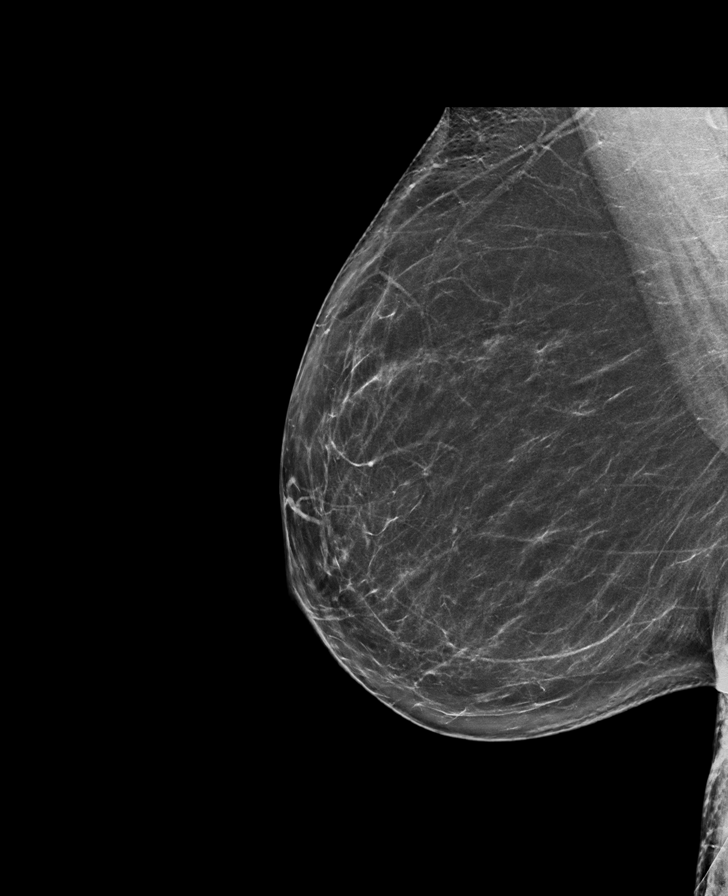

[R CC synth-2D]
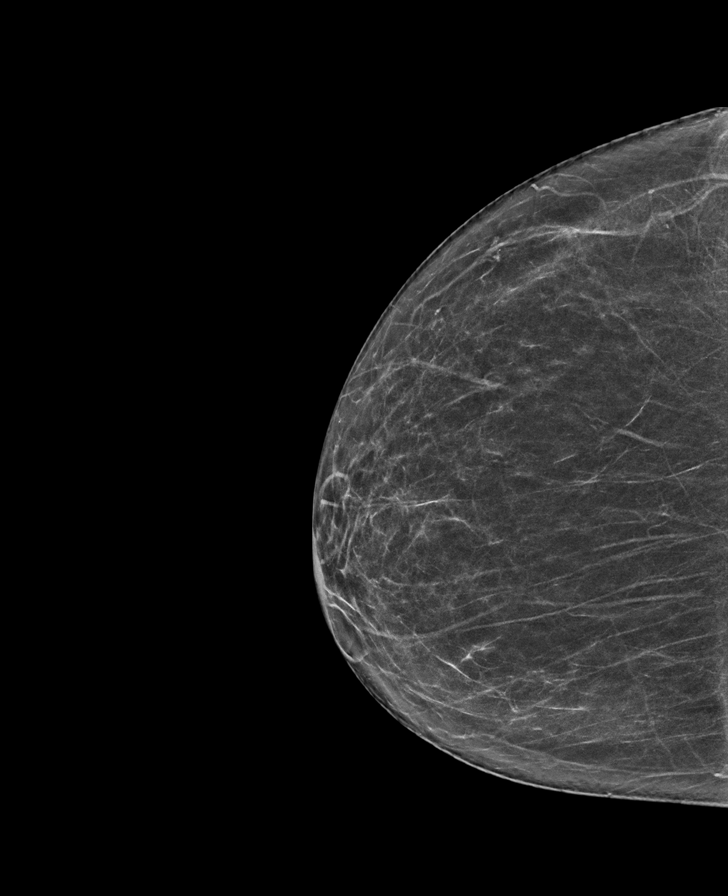

[L CC synth-2D]
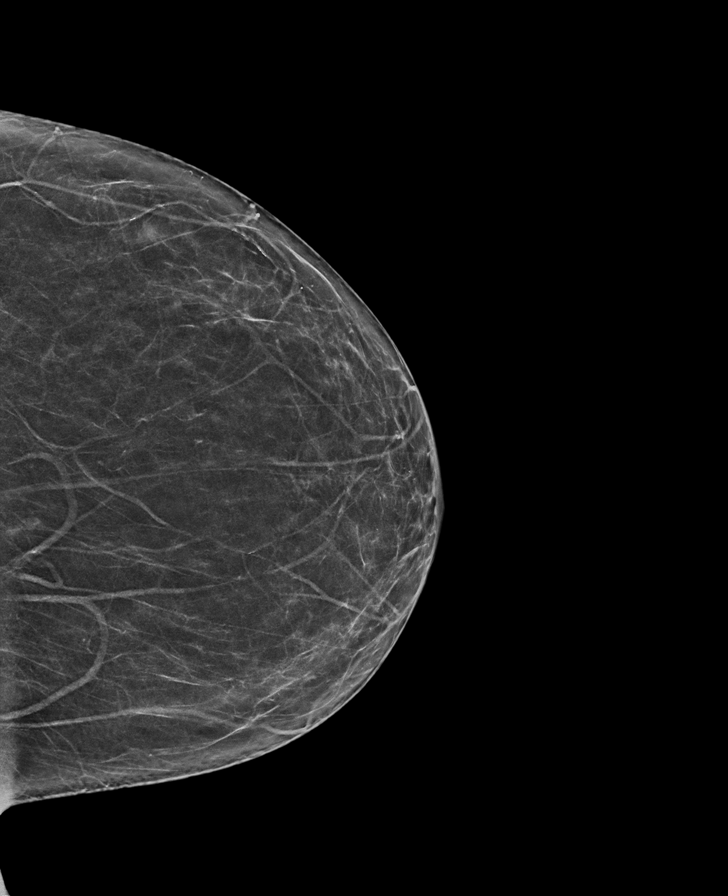

[R MLO tomo · tomo slice 39/76.0]
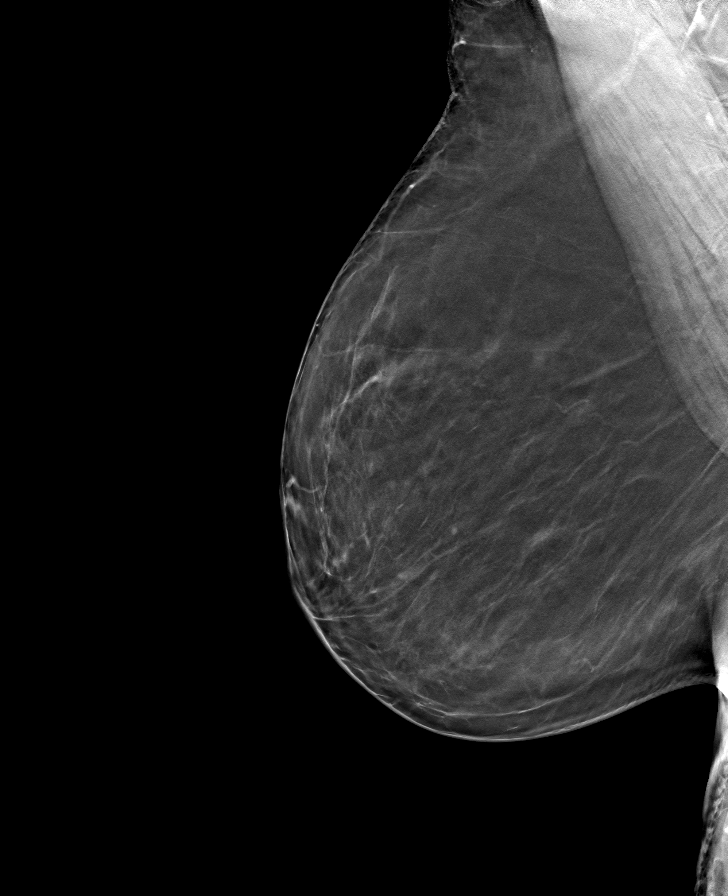

[L CC tomo · tomo slice 33/65.0]
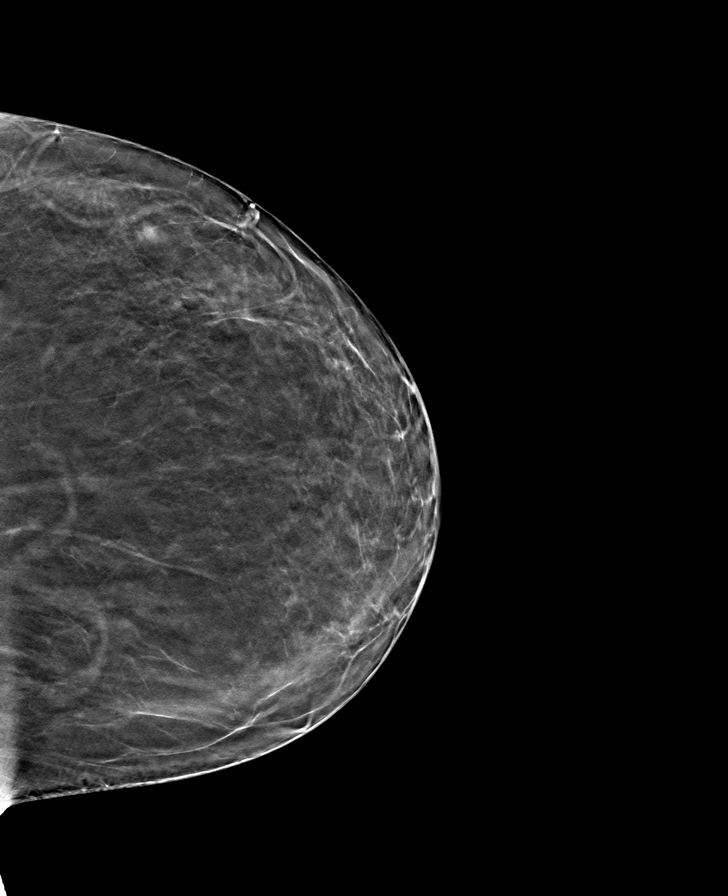

[L MLO tomo · tomo slice 37/74.0]
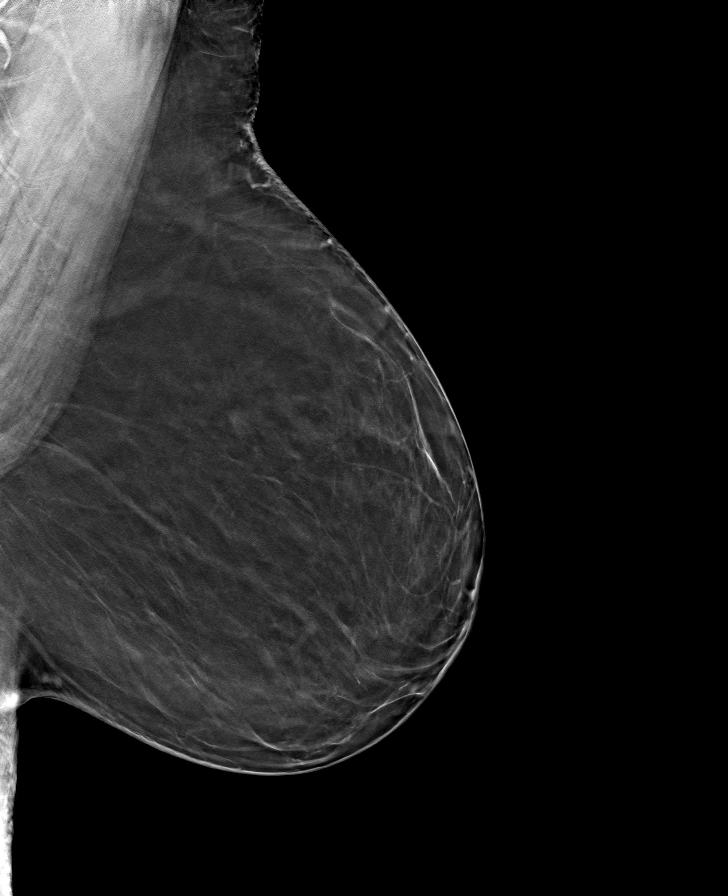

[R CC tomo · tomo slice 34/67.0]
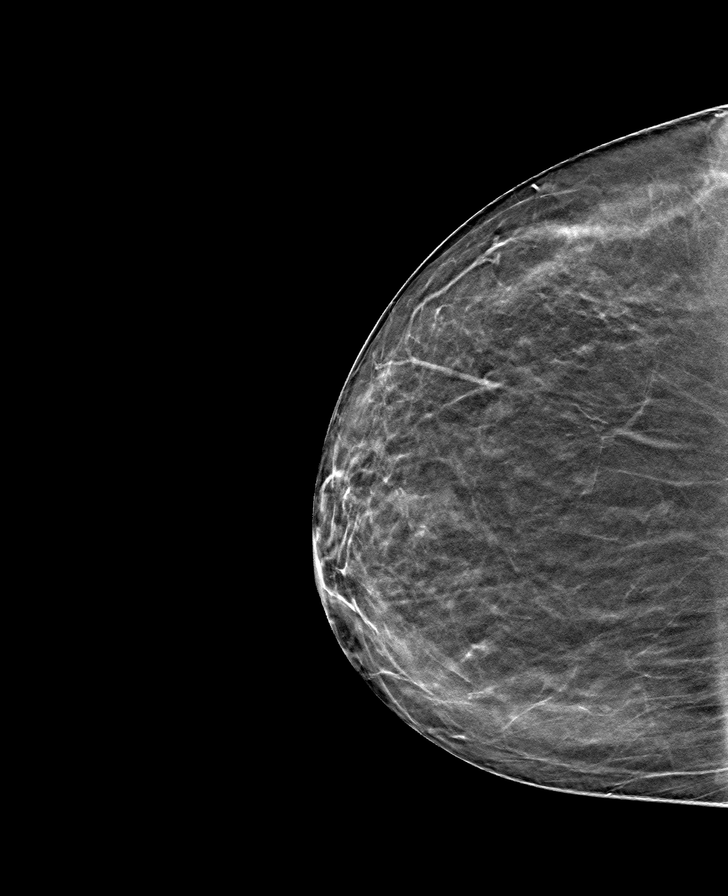

[8 of 24 positions shown; findings below may reference images not displayed]

FINDINGS: There are no findings suspicious for malignancy.
IMPRESSION: No mammographic evidence of malignancy. A result letter of this
screening mammogram will be mailed directly to the patient.

RECOMMENDATION:
Screening mammogram in one year. (Code:[8U])

BI-RADS CATEGORY  1: Negative.

## 2022-02-12 ENCOUNTER — Ambulatory Visit: Payer: Medicare PPO | Admitting: Podiatry

## 2022-02-20 ENCOUNTER — Other Ambulatory Visit: Payer: Self-pay

## 2022-02-20 ENCOUNTER — Ambulatory Visit: Payer: Medicare PPO | Admitting: Podiatry

## 2022-02-20 DIAGNOSIS — B351 Tinea unguium: Secondary | ICD-10-CM | POA: Diagnosis not present

## 2022-02-20 DIAGNOSIS — M79675 Pain in left toe(s): Secondary | ICD-10-CM | POA: Diagnosis not present

## 2022-02-20 DIAGNOSIS — E119 Type 2 diabetes mellitus without complications: Secondary | ICD-10-CM

## 2022-02-20 DIAGNOSIS — M2011 Hallux valgus (acquired), right foot: Secondary | ICD-10-CM

## 2022-02-20 DIAGNOSIS — L84 Corns and callosities: Secondary | ICD-10-CM | POA: Diagnosis not present

## 2022-02-20 DIAGNOSIS — M79674 Pain in right toe(s): Secondary | ICD-10-CM | POA: Diagnosis not present

## 2022-02-20 DIAGNOSIS — M2012 Hallux valgus (acquired), left foot: Secondary | ICD-10-CM

## 2022-02-20 DIAGNOSIS — E1142 Type 2 diabetes mellitus with diabetic polyneuropathy: Secondary | ICD-10-CM | POA: Diagnosis not present

## 2022-02-24 NOTE — Patient Instructions (Addendum)
Urine culture is pending.  Take cephalexin 250 mg twice daily for 7 days.  Uric acid results stable and faxed to Dr. Amil Amen.  Other labs faxed to [facility].  Addendum: Urine culture had no growth

## 2022-02-26 ENCOUNTER — Encounter: Payer: Self-pay | Admitting: Podiatry

## 2022-02-26 DIAGNOSIS — M1A00X0 Idiopathic chronic gout, unspecified site, without tophus (tophi): Secondary | ICD-10-CM | POA: Insufficient documentation

## 2022-02-26 DIAGNOSIS — C4359 Malignant melanoma of other part of trunk: Secondary | ICD-10-CM | POA: Insufficient documentation

## 2022-02-26 NOTE — Progress Notes (Signed)
ANNUAL DIABETIC FOOT EXAM  Subjective: [name] presents today for annual diabetic foot examination.  Patient confirms diagnosis of diabetes.  Patient denies any h/o foot wounds.  Patient has been diagnosed with neuropathy.  Patient's blood sugar was 189 mg/dl today.   Risk factors: diabetes, diabetic neuropathy, h/o MI, HTN, CKD, hyperlipidemia.  Debbie Showers, MD is patient's PCP. Last visit was February 04, 2022.  Past Medical History:  Diagnosis Date   Allergic rhinitis    Carbuncle, trunk    Carpal tunnel syndrome    Chronic kidney disease    Combined hyperlipidemia associated with type 2 diabetes mellitus (HCC)    Complication of anesthesia    Cyclical vomiting    Dependent edema    Diabetes mellitus type II    Diabetic autonomic neuropathy (HCC)    Diabetic peripheral neuropathy associated with type 2 diabetes mellitus (HCC)    Dyspepsia    Essential hypertension, benign    GERD (gastroesophageal reflux disease)    Goiter    Hypercholesteremia    Menopause    Obesity    OSA (obstructive sleep apnea) 09/23/2016   Osteopenia    PONV (postoperative nausea and vomiting)    Tachycardia    Type II or unspecified type diabetes mellitus without mention of complication, not stated as uncontrolled    Patient Active Problem List   Diagnosis Date Noted   Chronic gouty arthritis 02/26/2022   Malignant melanoma of trunk ([place]) 02/26/2022   NSTEMI (non-ST elevated myocardial infarction) ([place]) 10/06/2021   Acute renal failure superimposed on stage 4 chronic kidney disease ([place]) 10/06/2021   SVT (supraventricular tachycardia) ([place]) 10/05/2021   Autoimmune thyroiditis 08/01/2021   Polyarthralgia 08/01/2021   Diabetes mellitus type 2, uncomplicated ([place]) 74/11/8785   OSA (obstructive sleep apnea) 09/23/2016   Pre-transplant evaluation for kidney transplant 03/15/2013   Hypoglycemia associated with diabetes ([place]) 03/10/2013   Gout 10/24/2012   Goiter     Obesity    Diabetic peripheral neuropathy associated with type 2 diabetes mellitus ([place])    Diabetic autonomic neuropathy (HCC)    Tachycardia    Combined hyperlipidemia associated with type 2 diabetes mellitus ([place])    Dyspepsia    Hyperlipidemia 06/03/2011   CKD (chronic kidney disease), stage IV ([place]) 06/03/2011   DIABETES MELLITUS, TYPE II 03/22/2008   Essential hypertension 03/22/2008   THYROIDITIS, HX OF 03/22/2008   Past Surgical History:  Procedure Laterality Date   CESAREAN SECTION  1983   COLONOSCOPY WITH PROPOFOL N/A 07/09/2018   Procedure: COLONOSCOPY WITH PROPOFOL;  Surgeon: Juanita Craver, MD;  Location: [facility];  Service: Endoscopy;  Laterality: N/A;   DILATION AND CURETTAGE OF UTERUS  1988   GANGLION CYST EXCISION  1959   rt wrist   POLYPECTOMY  07/09/2018   Procedure: POLYPECTOMY;  Surgeon: Juanita Craver, MD;  Location: [facility];  Service: Endoscopy;;   Current Outpatient Medications on File Prior to Visit  Medication Sig Dispense Refill   Accu-Chek Softclix Lancets lancets See admin instructions.     Ascorbic Acid (VITAMIN C) 500 MG CAPS Take 500 mg by mouth daily.     aspirin 81 MG chewable tablet Chew 81 mg by mouth daily.     atorvastatin (LIPITOR) 40 MG tablet Take 1 tablet by mouth daily.     B-D UF III MINI PEN NEEDLES 31G X 5 MM MISC      carvedilol (COREG) 25 MG tablet Take 1 tablet (25 mg total) by mouth 2 (two) times  daily with a meal. 60 tablet 0   cholecalciferol (VITAMIN D) 1000 units tablet Take 1,000 Units by mouth 3 (three) times daily.     COVID-19 mRNA bivalent vaccine, Pfizer, (PFIZER COVID-19 VAC BIVALENT) injection Inject into the muscle. 0.3 mL 0   ezetimibe (ZETIA) 10 MG tablet 1 tablet     febuxostat (ULORIC) 40 MG tablet 1 tablet     furosemide (LASIX) 20 MG tablet 1 tablet     glucose blood (ONETOUCH VERIO) test strip Check blood sugar 3 x daily 100 each 6   insulin lispro (HUMALOG) 100 UNIT/ML KwikPen 3-5u     insulin NPH Human  (NOVOLIN N) 100 UNIT/ML injection 27 in am and pm     ketoconazole (NIZORAL) 2 % cream Apply 1 application topically daily. 15 g 11   loratadine (CLARITIN) 10 MG tablet Take 10 mg by mouth as needed.      Multiple Vitamin (MULTIVITAMIN) capsule Take 1 capsule by mouth daily.     nitroGLYCERIN (NITROSTAT) 0.4 MG SL tablet Place under the tongue.     promethazine (PHENERGAN) 25 MG tablet Take 25 mg by mouth daily as needed for nausea.     sodium bicarbonate 650 MG tablet 1 tablet     No current facility-administered medications on file prior to visit.    Allergies  Allergen Reactions   Fish Oil Shortness Of Breath    Other reaction(s): Unknown   Metformin     Other reaction(s): Other (See Comments), Unknown "Facial puffiness"  Pt stated her kidney specialist told her to add metformin to her drug allergies list.    Metformin And Related Other (See Comments)    KIDNEY FAILURE?   Fenofibrate     fever   Ciprocin-Fluocin-Procin [Fluocinolone]     Acute kidney failure   Ciprofloxacin Other (See Comments)    Kidney failure Other reaction(s): Unknown   Fenofibrate     Other reaction(s): fever   Insulins Other (See Comments)    Migraines, nausea Other reaction(s): Other (See Comments), Unknown Migraines, nausea NOVLOG    Latex     Other reaction(s): rash   Niacin     Other reaction(s): Unknown   Orange Fruit [Citrus]    Ramipril Cough   Ramipril     Other reaction(s): cough   Sulfa Antibiotics Rash   Social History   Occupational History   Occupation: retired  Tobacco Use   Smoking status: Never   Smokeless tobacco: Never  Vaping Use   Vaping Use: Never used  Substance and Sexual Activity   Alcohol use: No    Alcohol/week: 0.0 - 1.0 standard drinks    Comment: rarely   Drug use: No   Sexual activity: Not on file   Family History  Problem Relation Age of Onset   COPD Mother    Hypertension Mother    Thyroid disease Mother    Heart disease Father     Hyperlipidemia Father    Diabetes Father    Heart disease Sister    Cancer Maternal Grandfather    Diabetes Paternal Grandmother    Breast cancer Neg Hx    Immunization History  Administered Date(s) Administered   Influenza Split 10/29/2012   Influenza,inj,Quad PF,6+ Mos 10/13/2013, 10/12/2014, 09/30/2015, 09/09/2016, 10/14/2017, 10/08/2018, 09/22/2019, 09/13/2020, 10/16/2021   Influenza-Unspecified 10/08/2013   PFIZER(Purple Top)SARS-COV-2 Vaccination 01/20/2020, 02/10/2020, 11/11/2020   Pfizer Covid-19 Vaccine Bivalent Booster 81yrs & up 12/06/2021   Pneumococcal Conjugate-13 03/14/2015, 03/08/2019   Pneumococcal Polysaccharide-23 04/29/2006, 03/30/2020  Tdap 01/31/2004, 02/17/2014   Zoster Recombinat (Shingrix) 05/12/2020, 07/21/2020     Review of Systems: Negative except as noted in the HPI.   Objective: There were no vitals filed for this visit.  [name] is a pleasant 71 y.o. female in NAD. AAO X 3.  Vascular Examination: Capillary refill time to digits immediate b/l. Palpable pedal pulses b/l LE. Pedal hair sparse. No pain with calf compression b/l. Lower extremity skin temperature gradient within normal limits. No edema noted b/l LE. No cyanosis or clubbing noted b/l LE.  Dermatological Examination: Pedal integument with normal turgor, texture and tone BLE. No open wounds b/l LE. No interdigital macerations noted b/l LE. Toenails 1-5 b/l elongated, discolored, dystrophic, thickened, crumbly with subungual debris and tenderness to dorsal palpation. Hyperkeratotic lesion(s) bilateral great toes and submet head 1 left foot.  No erythema, no edema, no drainage, no fluctuance.  Neurological Examination: Protective sensation intact 5/5 intact bilaterally with 10g monofilament b/l. Vibratory sensation intact b/l.  Musculoskeletal Examination: Muscle strength 5/5 to all lower extremity muscle groups bilaterally. HAV with bunion deformity noted b/l LE.  Footwear  Assessment: Does the patient wear appropriate shoes? Yes. Does the patient need inserts/orthotics? Yes.  Hemoglobin A1C Latest Ref Rng & Units 10/06/2021 06/26/2021 03/09/2021  HGBA1C 4.8 - 5.6 % 7.6(H) 6.8(H) 6.8(H)  Some recent data might be hidden   Assessment: 1. Pain due to onychomycosis of toenails of both feet   2. Callus   3. Hallux valgus, acquired, bilateral   4. Diabetic peripheral neuropathy associated with type 2 diabetes mellitus ([place])   5. Encounter for diabetic foot exam ([place])      ADA Risk Categorization: High Risk  Patient has one or more of the following: Loss of protective sensation Absent pedal pulses Severe Foot deformity History of foot ulcer  Plan: -Diabetic foot examination performed today. -Continue foot and shoe inspections daily. Monitor blood glucose per PCP/Endocrinologist's recommendations. -Mycotic toenails 1-5 bilaterally were debrided in length and girth with sterile nail nippers and dremel without incident. -Callus(es) bilateral great toes and submet head 1 left foot pared utilizing sterile scalpel blade without complication or incident. Total number debrided =3. -Patient/POA to call should there be question/concern in the interim. Return in about 3 months (around 05/20/2022).  Marzetta Board, DPM

## 2022-03-15 ENCOUNTER — Other Ambulatory Visit: Payer: Self-pay

## 2022-03-15 ENCOUNTER — Other Ambulatory Visit: Payer: Medicare PPO

## 2022-03-15 DIAGNOSIS — E1169 Type 2 diabetes mellitus with other specified complication: Secondary | ICD-10-CM | POA: Diagnosis not present

## 2022-03-15 DIAGNOSIS — I10 Essential (primary) hypertension: Secondary | ICD-10-CM | POA: Diagnosis not present

## 2022-03-15 DIAGNOSIS — E785 Hyperlipidemia, unspecified: Secondary | ICD-10-CM | POA: Diagnosis not present

## 2022-03-15 DIAGNOSIS — E119 Type 2 diabetes mellitus without complications: Secondary | ICD-10-CM | POA: Diagnosis not present

## 2022-03-15 DIAGNOSIS — E063 Autoimmune thyroiditis: Secondary | ICD-10-CM | POA: Diagnosis not present

## 2022-03-16 LAB — CBC WITH DIFFERENTIAL/PLATELET
Absolute Monocytes: 767 cells/uL (ref 200–950)
Basophils Absolute: 51 cells/uL (ref 0–200)
Basophils Relative: 0.7 %
Eosinophils Absolute: 380 cells/uL (ref 15–500)
Eosinophils Relative: 5.2 %
HCT: 39.4 % (ref 35.0–45.0)
Hemoglobin: 13.1 g/dL (ref 11.7–15.5)
Lymphs Abs: 1278 cells/uL (ref 850–3900)
MCH: 30.9 pg (ref 27.0–33.0)
MCHC: 33.2 g/dL (ref 32.0–36.0)
MCV: 92.9 fL (ref 80.0–100.0)
MPV: 9.3 fL (ref 7.5–12.5)
Monocytes Relative: 10.5 %
Neutro Abs: 4825 cells/uL (ref 1500–7800)
Neutrophils Relative %: 66.1 %
Platelets: 268 10*3/uL (ref 140–400)
RBC: 4.24 10*6/uL (ref 3.80–5.10)
RDW: 14 % (ref 11.0–15.0)
Total Lymphocyte: 17.5 %
WBC: 7.3 10*3/uL (ref 3.8–10.8)

## 2022-03-16 LAB — LIPID PANEL
Cholesterol: 203 mg/dL — ABNORMAL HIGH (ref <200)
HDL: 29 mg/dL — ABNORMAL LOW (ref >=50)
Non-HDL Cholesterol (Calc): 174 mg/dL (calc) — ABNORMAL HIGH (ref <130)
Total CHOL/HDL Ratio: 7 (calc) — ABNORMAL HIGH (ref <5.0)
Triglycerides: 709 mg/dL — ABNORMAL HIGH (ref <150)

## 2022-03-16 LAB — MICROALBUMIN, URINE: Microalb, Ur: 27.7 mg/dL

## 2022-03-16 LAB — COMPLETE METABOLIC PANEL WITH GFR
AG Ratio: 1.5 (calc) (ref 1.0–2.5)
ALT: 19 U/L (ref 6–29)
AST: 20 U/L (ref 10–35)
Albumin: 3.9 g/dL (ref 3.6–5.1)
Alkaline phosphatase (APISO): 131 U/L (ref 37–153)
BUN/Creatinine Ratio: 26 (calc) — ABNORMAL HIGH (ref 6–22)
BUN: 64 mg/dL — ABNORMAL HIGH (ref 7–25)
CO2: 20 mmol/L (ref 20–32)
Calcium: 9.4 mg/dL (ref 8.6–10.4)
Chloride: 109 mmol/L (ref 98–110)
Creat: 2.5 mg/dL — ABNORMAL HIGH (ref 0.50–1.05)
Globulin: 2.6 g/dL (calc) (ref 1.9–3.7)
Glucose, Bld: 162 mg/dL — ABNORMAL HIGH (ref 65–99)
Potassium: 4.9 mmol/L (ref 3.5–5.3)
Sodium: 141 mmol/L (ref 135–146)
Total Bilirubin: 0.5 mg/dL (ref 0.2–1.2)
Total Protein: 6.5 g/dL (ref 6.1–8.1)
eGFR: 20 mL/min/{1.73_m2} — ABNORMAL LOW (ref >=60)

## 2022-03-16 LAB — HEMOGLOBIN A1C
Hgb A1c MFr Bld: 8.2 % of total Hgb — ABNORMAL HIGH (ref <5.7)
Mean Plasma Glucose: 189 mg/dL
eAG (mmol/L): 10.4 mmol/L

## 2022-03-16 LAB — TSH: TSH: 1.9 mIU/L (ref 0.40–4.50)

## 2022-03-18 ENCOUNTER — Other Ambulatory Visit: Payer: Self-pay

## 2022-03-18 ENCOUNTER — Encounter: Payer: Self-pay | Admitting: Internal Medicine

## 2022-03-18 ENCOUNTER — Ambulatory Visit (INDEPENDENT_AMBULATORY_CARE_PROVIDER_SITE_OTHER): Payer: Medicare PPO | Admitting: Internal Medicine

## 2022-03-18 VITALS — BP 108/64 | HR 80 | Temp 98.1°F | Ht 63.25 in | Wt 221.0 lb

## 2022-03-18 DIAGNOSIS — Z780 Asymptomatic menopausal state: Secondary | ICD-10-CM

## 2022-03-18 DIAGNOSIS — Z6838 Body mass index (BMI) 38.0-38.9, adult: Secondary | ICD-10-CM | POA: Diagnosis not present

## 2022-03-18 DIAGNOSIS — M8580 Other specified disorders of bone density and structure, unspecified site: Secondary | ICD-10-CM

## 2022-03-18 DIAGNOSIS — R82998 Other abnormal findings in urine: Secondary | ICD-10-CM | POA: Diagnosis not present

## 2022-03-18 DIAGNOSIS — E1169 Type 2 diabetes mellitus with other specified complication: Secondary | ICD-10-CM

## 2022-03-18 DIAGNOSIS — N184 Chronic kidney disease, stage 4 (severe): Secondary | ICD-10-CM

## 2022-03-18 DIAGNOSIS — I10 Essential (primary) hypertension: Secondary | ICD-10-CM

## 2022-03-18 DIAGNOSIS — E785 Hyperlipidemia, unspecified: Secondary | ICD-10-CM

## 2022-03-18 DIAGNOSIS — R319 Hematuria, unspecified: Secondary | ICD-10-CM | POA: Diagnosis not present

## 2022-03-18 DIAGNOSIS — Z0000 Encounter for general adult medical examination without abnormal findings: Secondary | ICD-10-CM

## 2022-03-18 DIAGNOSIS — Z8739 Personal history of other diseases of the musculoskeletal system and connective tissue: Secondary | ICD-10-CM

## 2022-03-18 LAB — POCT URINALYSIS DIPSTICK
Bilirubin, UA: NEGATIVE
Glucose, UA: NEGATIVE
Ketones, UA: NEGATIVE
Nitrite, UA: NEGATIVE
Protein, UA: POSITIVE — AB
Spec Grav, UA: 1.01 (ref 1.010–1.025)
Urobilinogen, UA: 0.2 E.U./dL
pH, UA: 7.5 (ref 5.0–8.0)

## 2022-03-18 MED ORDER — AZITHROMYCIN 250 MG PO TABS: ORAL_TABLET | ORAL | 0 refills | Status: AC

## 2022-03-18 MED ORDER — BUPROPION HCL ER (XL) 150 MG PO TB24: 150.0000 mg | ORAL_TABLET | Freq: Every day | ORAL | 0 refills | Status: DC

## 2022-03-18 NOTE — Progress Notes (Signed)
? ?  Subjective:  ? ? Patient ID: [name], female    DOB: 15-Aug-1951, 71 y.o.   MRN: [id number] ? ?HPI 71 year old Female seen for Medicare wellness and health maintenance exam.  ? ?Hgb AIC is 8.2 %.  Will be seeing Dr. Chalmers Cater ,Endocrinologist, soon. Was 7.6% 5 months ago. ? ?Weight  increased 4 pounds since March 2022. ? ?She may be a bit depressed and is going to try Wellbutrin XL 150 mg daily for 4 to 6 weeks to see if that helps motivate her to diet and exercise.  She also will contact dietitian. ? ? No longer on gemfibrozil due to renal issues Followed at [facility].Of course, the triglycerides have gone up to 709 after stopping gemfibrozil. Has low HDL of 29. ? ?Still uses C-Pap for sleep apnea per Dr. Halford Chessman. ? ?Had mammogram in February.Bone density done 2021 and has been reordered for this year. ? ? Has follow up with Dr. Edwin Dada, Cardiologist, in May  and sees Nephrologist next month. ? ?Planning trip to [place] with daughter in December. ? ?Fasting glucose is 162. BUN is 64 and Creatinine is 2.50. eGFR is 20 cc/min. Electrolytes and Liver functions are normal.TSH is normal. ? ?Had colonoscopy in 2019. ? ? ? ? ?Review of Systems no new complaints. ? ?   ?Objective:  ? Physical Exam ?Blood pressure 108/64 pulse 90 temperature 98.1 degrees pulse oximetry 98% weight 221 pounds BMI 38.84 ? ? ? ?   ?Assessment & Plan:  ?Stage IV chronic kidney disease-followed by nephrology ? ?Type 2 diabetes mellitus followed by endocrinologist, Dr. Chalmers Cater.  Currently Novolin N 27 units in the morning and in the evening. ? ?BMI 38.84-suggested she call dietitian.  Encourage patient to do more walking if tolerated ? ?Family history of heart disease-has follow-up with Dr. Edwin [facility]ada at [facility] soon ? ?History of sleep apnea seen by Dr. Halford Chessman, pulmonologist ? ?History of hypertension-stable on current regimen of carvedilol and furosemide ? ?Hypertriglyceridemia-triglycerides in the 700 range.  Gemfibrozil had been  discontinued due to edema.  He is on Zetia but that does not generally help hypertriglyceridemia. ? ?History of gout treated with Uloric ? ?Have prescribed Zithromax Z-PAK for her to take with her during travel if needed for respiratory infection. ? ?Possible mild depression we will try Wellbutrin XL 150 mg daily. ? ?Plan: Follow-up in 1 year or as needed. ? ?

## 2022-03-18 NOTE — Progress Notes (Signed)
Already done

## 2022-03-18 NOTE — Progress Notes (Addendum)
? ? ? ?Annual Wellness Visit ? ?  ? ?Patient: [name], Female    DOB: [birth year], 71 y.o.   MRN: [id number] ?Visit Date: 03/18/2022 ? ?Chief Complaint  ?Patient presents with  ? Medicare Wellness  ? Annual Exam  ? ?Subjective  ?  ?[name] is a 71 y.o. Female who presents today for her Annual Wellness Visit. ? ?HPI She also presents for health maintenance exam and evaluation of medical issues. ? ?She has a history of sleep apnea followed by Dr. Halford Chessman, Pulmonologist.  Seeing Podiatrist for onychomycosis.  Followed by Dr. Garth Bigness at [facility] for Diabetes mellitus.  History of idiopathic chronic gout followed by [facility].  History of chronic kidney disease Stage 4. Renal ultrasound in July 2022 did not show obstruction although creatinine has been slightly increasing over the past few months. ? ?History of tubular adenoma in 2019 on colonoscopy by Dr. Collene Mares. ? ?History of osteoporosis but has not wanted to be on therapy.  Dr. Lorrene Reid had told her she could take Prolia but thus far patient has declined. ? ?She has annual diabetic eye exam. ? ?She is allergic to sulfa and says that Altace is caused cough in the past. ? ?Says her dry weight is 212 pounds. ? ?Past medical history: GE reflux, allergic rhinitis, dependent edema, carpal tunnel syndrome 2006, ganglion cyst removed from right wrist 1959, C-section 1993, D&C 1998. ? ?In 2007 she had right-sided flank pain.  Was thought to have nephrolithiasis but was found to have obstruction at the UP junction.  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  Creatinine was mildly elevated at 1.5.  In January 2008 she was admitted with dehydration and renal insufficiency.  Creatinine initially was 1.6 but improved with hydration to 1.19.  She was on antibiotics for wound infection and had nausea and vomiting. ? ?In July 2009 she was admitted with acute renal failure with creatinine up  to 6.0.  She had a left ureteral stent placed.  Renogram showed poor function and poor clearance of the tracer from left kidney as well as increasing retention of the right kidney.  Retrograde pyelogram showed no evidence of obstruction.  He was never clear exactly what caused the acute renal failure.  It was thought to perhaps be acute on chronic injury.  There was possible contribution of obstruction and metformin. ? ?Had hernia repair by Dr. Theda Sers at [facility] in 2010.  Had an incisional ventral hernia repair with mesh December 2011 by Dr. Dossie Der at [facility].  Baseline creatinine was 2.24 at that point. ? ?  Was seen by Dr. Dossie Der in 2021 for consultation about possible recurrence in right lower quadrant and possible hernia in left lower quadrant.  CT did not show recurrence or new hernia. ? ?History of significant hypertriglyceridemia and is unable to tolerate Tricor because she developed a fever as a side effect of this and it was discontinued in the Summer 2012. ? 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 ? ?Social history: She is married.  1 adult daughter who is a Lawyer in [place].  She does not smoke.  Occasional wine consumption.  Previously worked for the [employer] but is now retired. ? ?Family history: Father with history of aortic valve replacement, hyperlipidemia, rheumatic heart disease.  Mother with history of hypertension, hypothyroidism, hyperlipidemia, COPD.  1 sister died in  2004-03-31 with coronary artery disease and hyperlipidemia.  1 sister living with hyperlipidemia. ? ? ? ? ? ? ? ? ? ? ? ? ? ?Social History  ? ?Social History Narrative  ? Social history: She is married.  1 adult daughter who is a Lawyer in [place].  She does not smoke.  Occasional wine consumption.  Formerly worked for [employer] but has retired.  ?    ? Family history: Father with  history of aortic valve replacement, hyperlipidemia, rheumatic heart disease.  Mother with history of hypertension, hypothyroidism, hyperlipidemia, COPD.  1 sister died in March 31, 2004 with coronary artery disease and hyperlipidemia.  1 sister living with hyperlipidemia.  ? ? ?Patient Care Team: ?Elby Showers, MD as PCP - General (Internal Medicine) ? ?Review of Systems denies chest pain, shortness of breath, abdominal pain.  May be a bit depressed.  Willing to try antidepressant. ? ? Objective  ?  ?Vitals: BP 108/64   Pulse 80   Temp 98.1 ?F (36.7 ?C) (Tympanic)   Ht 5' 3.25" (1.607 m)   Wt 221 lb (100.2 kg)   SpO2 98%   BMI 38.84 kg/m?  ? ?Physical Exam ? ?Skin: Warm and dry.  No cervical adenopathy.  No thyromegaly.  Chest is clear to auscultation.  Cardiac exam: Regular rate and rhythm without ectopy.  Abdomen is soft nondistended without hepatosplenomegaly masses or tenderness.  No lower extremity pitting edema.  Neuro is intact without gross focal deficits.  Affect thought and judgment are normal. ? ? ?Most recent functional status assessment: ?In your present state of health, do you have any difficulty performing the following activities: 03/18/2022  ?Hearing? N  ?Vision? N  ?Difficulty concentrating or making decisions? N  ?Walking or climbing stairs? N  ?Dressing or bathing? N  ?Doing errands, shopping? N  ?Preparing Food and eating ? N  ?Using the Toilet? N  ?In the past six months, have you accidently leaked urine? N  ?Do you have problems with loss of bowel control? N  ?Managing your Medications? N  ?Managing your Finances? N  ?Housekeeping or managing your Housekeeping? N  ?Some recent data might be hidden  ? ?Most recent fall risk assessment: ?Fall Risk  03/18/2022  ?Falls in the past year? 0  ?Number falls in past yr: 0  ?Injury with Fall? 0  ?Comment -  ?Risk for fall due to : No Fall Risks  ?Follow up Falls evaluation completed  ? ? Most recent depression screenings: ?PHQ 2/9 Scores 03/18/2022 03/02/2021   ?PHQ - 2 Score 0 0  ?PHQ- 9 Score - -  ? ?Most recent cognitive screening: ?6CIT Screen 03/18/2022  ?What Year? 0 points  ?What month? 0 points  ?What time? 0 points  ?Count back from 20 0 points  ?Months in reverse 0 points  ?Repeat phrase 0 points  ?Total Score 0  ? ? ? ? ? Assessment & Plan  ?Type 2 diabetes mellitus.  Hemoglobin A1c has increased to 8.2% and 5 months ago was 7.6% and is seeing Dr. Michiel Sites soon. ? ?Essential hypertension-stable with current methimazole ? ?Possible depression starting Wellbutrin XL 150 mg daily ? ?Traveling to [place] in [place] prescribed Zithromax Z-PAK 2 tablets day 1 followed by 1 tablet days 2 through 5 to take with her for travel ? ?Hyperlipidemia treated with generic Lipitor 40 mg daily and Zetia 10 mg daily.  Intolerant of Tricor. ? ?History of gout followed by [facility] ? ?History of vitamin  D deficiency ? ?Chronic kidney disease stage IV followed by [facility] ? ?Plan: Have refilled Zithromax for travel.  Patient will try Wellbutrin XL 150 mg daily for possible depression.  Suggest that she see dietitian again.  Needs to walk.  Suggest returning in 1 year or as needed as she has multiple providers for specialty care. ? ?  ? ?Annual wellness visit done today including the all of the following: ?Reviewed patient's Family Medical History ?Reviewed and updated list of patient's medical providers ?Assessment of cognitive impairment was done ?Assessed patient's functional ability ?Established a written schedule for health screening services ?Health Risk Assessent Completed and Reviewed ? ?Discussed health benefits of physical activity, and encouraged her to engage in regular exercise appropriate for her age and condition.  ?  ? ? ?  ?I, Elby Showers, MD, have reviewed all documentation for this visit. The documentation on 03/18/22 for the exam, diagnosis, procedures, and orders are all accurate and complete. ? ?Angus Seller, CMA  ? ?

## 2022-03-18 NOTE — Patient Instructions (Addendum)
Wellbutrin XL 150 mg daily.  Have refilled Zithromax for travel. Suggest seeing dietician again.  Continue current medications and continue follow-up with Specialists.  Return in 1 year or as needed. ?

## 2022-03-19 LAB — URINE CULTURE
MICRO NUMBER:: 13152216
Result:: NO GROWTH
SPECIMEN QUALITY:: ADEQUATE

## 2022-03-21 DIAGNOSIS — D1722 Benign lipomatous neoplasm of skin and subcutaneous tissue of left arm: Secondary | ICD-10-CM | POA: Diagnosis not present

## 2022-03-26 DIAGNOSIS — E1165 Type 2 diabetes mellitus with hyperglycemia: Secondary | ICD-10-CM | POA: Diagnosis not present

## 2022-03-27 ENCOUNTER — Other Ambulatory Visit: Payer: Self-pay | Admitting: Internal Medicine

## 2022-04-16 DIAGNOSIS — Z808 Family history of malignant neoplasm of other organs or systems: Secondary | ICD-10-CM | POA: Diagnosis not present

## 2022-04-16 DIAGNOSIS — L578 Other skin changes due to chronic exposure to nonionizing radiation: Secondary | ICD-10-CM | POA: Diagnosis not present

## 2022-04-16 DIAGNOSIS — D225 Melanocytic nevi of trunk: Secondary | ICD-10-CM | POA: Diagnosis not present

## 2022-04-16 DIAGNOSIS — L570 Actinic keratosis: Secondary | ICD-10-CM | POA: Diagnosis not present

## 2022-04-16 DIAGNOSIS — L821 Other seborrheic keratosis: Secondary | ICD-10-CM | POA: Diagnosis not present

## 2022-04-16 DIAGNOSIS — Z85820 Personal history of malignant melanoma of skin: Secondary | ICD-10-CM | POA: Diagnosis not present

## 2022-04-16 DIAGNOSIS — L814 Other melanin hyperpigmentation: Secondary | ICD-10-CM | POA: Diagnosis not present

## 2022-04-29 ENCOUNTER — Ambulatory Visit: Payer: Medicare PPO | Admitting: Pulmonary Disease

## 2022-04-29 ENCOUNTER — Encounter: Payer: Self-pay | Admitting: Pulmonary Disease

## 2022-04-29 VITALS — BP 132/70 | HR 92 | Temp 98.0°F | Ht 64.0 in | Wt 224.6 lb

## 2022-04-29 DIAGNOSIS — Z9989 Dependence on other enabling machines and devices: Secondary | ICD-10-CM

## 2022-04-29 DIAGNOSIS — R0609 Other forms of dyspnea: Secondary | ICD-10-CM | POA: Diagnosis not present

## 2022-04-29 DIAGNOSIS — G4733 Obstructive sleep apnea (adult) (pediatric): Secondary | ICD-10-CM | POA: Diagnosis not present

## 2022-04-29 NOTE — Patient Instructions (Signed)
Follow up in 1 year.

## 2022-04-29 NOTE — Progress Notes (Signed)
? ?[facility] ? ?Chief Complaint  ?Patient presents with  ? Follow-up  ?  Patient is doing good, no concerns  ? ? ?Constitutional:  ?BP 132/70 (BP Location: Left Arm, Patient Position: Sitting, Cuff Size: Normal)   Pulse 92   Temp 98 ?F (36.7 ?C) (Oral)   Ht '5\' 4"'$  (1.626 m)   Wt 224 lb 9.6 oz (101.9 kg)   SpO2 96%   BMI 38.55 kg/m?  ? ?Past Medical History:  ?Allergic rhinitis, Carpal tunnel, CKD, HLD, DM type 2, Neuropathy, HTN, GERD, Goiter, HLD, Osteopenia, CKD 4, COVID 19 in February 2021 and August 2022 ? ?Past Surgical History:  ?Her  has a past surgical history that includes Ganglion cyst excision (3710); Cesarean section (1983); Dilation and curettage of uterus (1988); Colonoscopy with propofol (N/A, 07/09/2018); and polypectomy (07/09/2018). ? ?Brief Summary:  ?[name] is a 71 y.o. female with obstructive sleep apnea. ?  ? ? ? ?Subjective:  ? ?She got her new CPAP.  Uses nightly.  Uses her old machine when she travels.  Thinking about getting a mini CPAP.  Asked about Inspire device.  No issues with mask fit. ? ?Breathing slowly getting better.  She in planning to start an exercise regimen again. ? ?Physical Exam:  ? ?Appearance - well kempt  ? ?ENMT - no sinus tenderness, no oral exudate, no LAN, Mallampati 2 airway, no stridor ? ?Respiratory - equal breath sounds bilaterally, no wheezing or rales ? ?CV - s1s2 regular rate and rhythm, no murmurs ? ?Ext - no clubbing, no edema ? ?Skin - no rashes ? ?Psych - normal mood and affect ? ?  ?Sleep Tests:  ?HST 09/12/16 >> AHI 30.1, SaO2 low 74% ?Auto CPAP 01/29/22 to 04/28/22 >> used on 77 of 90 nights with average 8 hrs 23 min.  Average AHI 1.1 with median CPAP 8 and 95 th percentile CPAP 12 cm H2O ? ?Cardiac Tests:  ?Echo 08/17/20 >> normal EF, mild LVH ? ?Social History:  ?She  reports that she has never smoked. She has never used smokeless tobacco. She reports that she does not drink alcohol and does  not use drugs. ? ?Family History:  ?Her family history includes COPD in her mother; Cancer in her maternal grandfather; Diabetes in her father and paternal grandmother; Heart disease in her father and sister; Hyperlipidemia in her father; Hypertension in her mother; Thyroid disease in her mother. ?  ? ? ?Assessment/Plan:  ? ?Obstructive sleep apnea. ?- she is compliant with CPAP and reports benefit ?- she uses [employer] for her DME ?- continue auto CPAP 5 to 15 cm H2O ?- reviewed Inspire device; her BMI is above cutoff to be a candidate for this ? ?Dyspnea on exertion. ?- suspect related to after effects of COVID infection from August 2022 and deconditioning ?- she has cardiology appointment later this month ?- don't think she needs additional pulmonary testing at this time ?- she plans to start an exercise regimen ? ?Time Spent Involved in Patient Care on Day of Examination:  ?26 minutes ? ?Follow up:  ? ?Patient Instructions  ?Follow up in 1 year ? ?Medication List:  ? ?Allergies as of 04/29/2022   ? ?   Reactions  ? Fish Oil Shortness Of Breath  ? Other reaction(s): Unknown  ? Metformin   ? Other reaction(s): Other (See Comments), Unknown ?"Facial puffiness" ?Pt stated her kidney specialist told her to add metformin to her drug allergies list.  ?  Metformin And Related Other (See Comments)  ? KIDNEY FAILURE?  ? Fenofibrate   ? fever  ? Ciprocin-fluocin-procin [fluocinolone]   ? Acute kidney failure  ? Ciprofloxacin Other (See Comments)  ? Kidney failure ?Other reaction(s): Unknown  ? Fenofibrate   ? Other reaction(s): fever  ? Insulin Degludec Other (See Comments)  ? Insulins Other (See Comments)  ? Migraines, nausea ?Other reaction(s): Other (See Comments), Unknown ?Migraines, nausea NOVLOG  ? Niacin   ? Other reaction(s): Unknown  ? Orange Fruit [citrus]   ? Ramipril Cough  ? Ramipril   ? Other reaction(s): cough  ? Sulfa Antibiotics Rash  ? ?  ? ?  ?Medication List  ?  ? ?  ? Accurate as of Apr 29, 2022  2:08 PM. If  you have any questions, ask your nurse or doctor.  ?  ?  ? ?  ? ?STOP taking these medications   ? ?buPROPion 150 MG 24 hr tablet ?Commonly known as: Wellbutrin XL ?Stopped by: [name], MD ?  ? ?  ? ?TAKE these medications   ? ?Accu-Chek Softclix Lancets lancets ?See admin instructions. ?  ?aspirin 81 MG chewable tablet ?Chew 81 mg by mouth daily. ?  ?atorvastatin 40 MG tablet ?Commonly known as: LIPITOR ?Take 1 tablet by mouth daily. ?  ?B-D UF III MINI PEN NEEDLES 31G X 5 MM Misc ?Generic drug: Insulin Pen Needle ?  ?carvedilol 25 MG tablet ?Commonly known as: COREG ?Take 1 tablet (25 mg total) by mouth 2 (two) times daily with a meal. ?  ?cholecalciferol 1000 units tablet ?Commonly known as: VITAMIN D ?Take 1,000 Units by mouth 3 (three) times daily. ?  ?ezetimibe 10 MG tablet ?Commonly known as: ZETIA ?1 tablet ?  ?febuxostat 40 MG tablet ?Commonly known as: ULORIC ?1 tablet ?  ?furosemide 20 MG tablet ?Commonly known as: LASIX ?1 tablet ?  ?glucose blood test strip ?Commonly known as: OneTouch Verio ?Check blood sugar 3 x daily ?  ?insulin lispro 100 UNIT/ML KwikPen ?Commonly known as: HUMALOG ?  ?insulin NPH Human 100 UNIT/ML injection ?Commonly known as: NOVOLIN N ?27 in am and pm ?  ?ketoconazole 2 % cream ?Commonly known as: NIZORAL ?APPLY TOPICALLY ONCE DAILY ?  ?loratadine 10 MG tablet ?Commonly known as: CLARITIN ?Take 10 mg by mouth as needed. ?  ?multivitamin capsule ?Take 1 capsule by mouth daily. ?  ?nitroGLYCERIN 0.4 MG SL tablet ?Commonly known as: NITROSTAT ?Place under the tongue. ?  ?promethazine 25 MG tablet ?Commonly known as: PHENERGAN ?Take 25 mg by mouth daily as needed for nausea. ?  ?sodium bicarbonate 650 MG tablet ?1,300 mg 2 (two) times daily. ?  ?Vitamin C 500 MG Caps ?Take 500 mg by mouth daily. ?  ? ?  ? ? ?Signature:  ?[name], MD ?[place] ?Pager - [phone] - [phone] ?04/29/2022, 2:08 PM ?  ? ? ? ? ? ? ? ? ?

## 2022-05-02 DIAGNOSIS — N183 Chronic kidney disease, stage 3 unspecified: Secondary | ICD-10-CM | POA: Diagnosis not present

## 2022-05-02 DIAGNOSIS — Z794 Long term (current) use of insulin: Secondary | ICD-10-CM | POA: Diagnosis not present

## 2022-05-02 DIAGNOSIS — R0609 Other forms of dyspnea: Secondary | ICD-10-CM | POA: Diagnosis not present

## 2022-05-02 DIAGNOSIS — E781 Pure hyperglyceridemia: Secondary | ICD-10-CM | POA: Diagnosis not present

## 2022-05-02 DIAGNOSIS — R9439 Abnormal result of other cardiovascular function study: Secondary | ICD-10-CM | POA: Diagnosis not present

## 2022-05-02 DIAGNOSIS — E119 Type 2 diabetes mellitus without complications: Secondary | ICD-10-CM | POA: Diagnosis not present

## 2022-05-02 DIAGNOSIS — I10 Essential (primary) hypertension: Secondary | ICD-10-CM | POA: Diagnosis not present

## 2022-05-06 DIAGNOSIS — E875 Hyperkalemia: Secondary | ICD-10-CM | POA: Diagnosis not present

## 2022-05-06 DIAGNOSIS — N2581 Secondary hyperparathyroidism of renal origin: Secondary | ICD-10-CM | POA: Diagnosis not present

## 2022-05-06 DIAGNOSIS — R809 Proteinuria, unspecified: Secondary | ICD-10-CM | POA: Diagnosis not present

## 2022-05-06 DIAGNOSIS — N179 Acute kidney failure, unspecified: Secondary | ICD-10-CM | POA: Diagnosis not present

## 2022-05-06 DIAGNOSIS — I129 Hypertensive chronic kidney disease with stage 1 through stage 4 chronic kidney disease, or unspecified chronic kidney disease: Secondary | ICD-10-CM | POA: Diagnosis not present

## 2022-05-06 DIAGNOSIS — E785 Hyperlipidemia, unspecified: Secondary | ICD-10-CM | POA: Diagnosis not present

## 2022-05-06 DIAGNOSIS — N184 Chronic kidney disease, stage 4 (severe): Secondary | ICD-10-CM | POA: Diagnosis not present

## 2022-05-06 DIAGNOSIS — N2589 Other disorders resulting from impaired renal tubular function: Secondary | ICD-10-CM | POA: Diagnosis not present

## 2022-05-06 DIAGNOSIS — E1122 Type 2 diabetes mellitus with diabetic chronic kidney disease: Secondary | ICD-10-CM | POA: Diagnosis not present

## 2022-05-08 DIAGNOSIS — R310 Gross hematuria: Secondary | ICD-10-CM | POA: Diagnosis not present

## 2022-05-16 DIAGNOSIS — K5730 Diverticulosis of large intestine without perforation or abscess without bleeding: Secondary | ICD-10-CM | POA: Diagnosis not present

## 2022-05-16 DIAGNOSIS — N281 Cyst of kidney, acquired: Secondary | ICD-10-CM | POA: Diagnosis not present

## 2022-05-16 DIAGNOSIS — R310 Gross hematuria: Secondary | ICD-10-CM | POA: Diagnosis not present

## 2022-05-20 ENCOUNTER — Other Ambulatory Visit: Payer: Self-pay | Admitting: Urology

## 2022-05-20 DIAGNOSIS — R310 Gross hematuria: Secondary | ICD-10-CM

## 2022-05-28 ENCOUNTER — Ambulatory Visit (INDEPENDENT_AMBULATORY_CARE_PROVIDER_SITE_OTHER): Payer: Medicare PPO | Admitting: Podiatry

## 2022-05-28 DIAGNOSIS — M79674 Pain in right toe(s): Secondary | ICD-10-CM | POA: Diagnosis not present

## 2022-05-28 DIAGNOSIS — L84 Corns and callosities: Secondary | ICD-10-CM | POA: Diagnosis not present

## 2022-05-28 DIAGNOSIS — B351 Tinea unguium: Secondary | ICD-10-CM | POA: Diagnosis not present

## 2022-05-28 DIAGNOSIS — M79675 Pain in left toe(s): Secondary | ICD-10-CM | POA: Diagnosis not present

## 2022-05-28 DIAGNOSIS — E1142 Type 2 diabetes mellitus with diabetic polyneuropathy: Secondary | ICD-10-CM | POA: Diagnosis not present

## 2022-05-28 DIAGNOSIS — L719 Rosacea, unspecified: Secondary | ICD-10-CM | POA: Insufficient documentation

## 2022-05-28 DIAGNOSIS — Z85820 Personal history of malignant melanoma of skin: Secondary | ICD-10-CM | POA: Insufficient documentation

## 2022-05-28 DIAGNOSIS — D1720 Benign lipomatous neoplasm of skin and subcutaneous tissue of unspecified limb: Secondary | ICD-10-CM | POA: Insufficient documentation

## 2022-05-28 DIAGNOSIS — D1801 Hemangioma of skin and subcutaneous tissue: Secondary | ICD-10-CM | POA: Insufficient documentation

## 2022-05-28 DIAGNOSIS — L821 Other seborrheic keratosis: Secondary | ICD-10-CM | POA: Insufficient documentation

## 2022-05-28 DIAGNOSIS — Z809 Family history of malignant neoplasm, unspecified: Secondary | ICD-10-CM | POA: Insufficient documentation

## 2022-05-28 DIAGNOSIS — L814 Other melanin hyperpigmentation: Secondary | ICD-10-CM | POA: Insufficient documentation

## 2022-05-30 DIAGNOSIS — I10 Essential (primary) hypertension: Secondary | ICD-10-CM | POA: Diagnosis not present

## 2022-05-30 DIAGNOSIS — Z794 Long term (current) use of insulin: Secondary | ICD-10-CM | POA: Diagnosis not present

## 2022-05-30 DIAGNOSIS — E119 Type 2 diabetes mellitus without complications: Secondary | ICD-10-CM | POA: Diagnosis not present

## 2022-05-30 DIAGNOSIS — E781 Pure hyperglyceridemia: Secondary | ICD-10-CM | POA: Diagnosis not present

## 2022-06-01 ENCOUNTER — Encounter: Payer: Self-pay | Admitting: Podiatry

## 2022-06-01 ENCOUNTER — Ambulatory Visit
Admission: RE | Admit: 2022-06-01 | Discharge: 2022-06-01 | Disposition: A | Discharge status: Home / Self Care | Payer: Medicare PPO | Source: Ambulatory Visit | Attending: Urology | Admitting: Urology

## 2022-06-01 DIAGNOSIS — N281 Cyst of kidney, acquired: Secondary | ICD-10-CM | POA: Diagnosis not present

## 2022-06-01 DIAGNOSIS — N261 Atrophy of kidney (terminal): Secondary | ICD-10-CM | POA: Diagnosis not present

## 2022-06-01 DIAGNOSIS — R310 Gross hematuria: Secondary | ICD-10-CM

## 2022-06-01 DIAGNOSIS — K5730 Diverticulosis of large intestine without perforation or abscess without bleeding: Secondary | ICD-10-CM | POA: Diagnosis not present

## 2022-06-01 DIAGNOSIS — K8020 Calculus of gallbladder without cholecystitis without obstruction: Secondary | ICD-10-CM | POA: Diagnosis not present

## 2022-06-01 IMAGING — MR MR ABDOMEN W/O CM
6 of 7 series · 42 of 48 positions shown · non-contrast
Comparison: No prior abdominal or pelvic MRI. CT the abdomen and
pelvis [DATE].

CLINICAL DATA: 70-year-old female with history of gross hematuria.

EXAM:
MRI ABDOMEN WITHOUT CONTRAST
TECHNIQUE: Multiplanar multisequence MR imaging was performed without the
administration of intravenous contrast.

[Series 2: cor haste- abd/ · coronal · 6.0mm · 0.74mm/px · 2 of 26 slices shown]
[im 1/26]
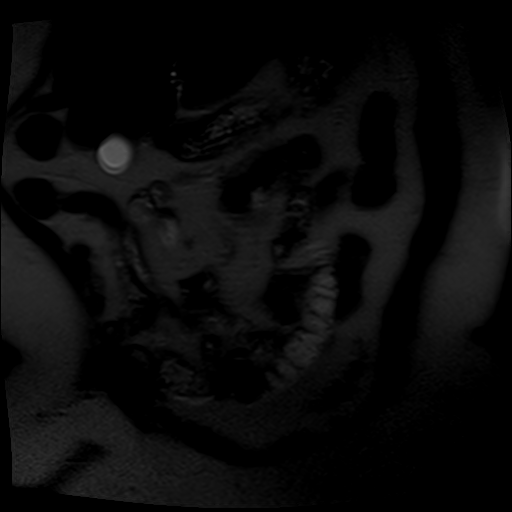
[im 26/26]
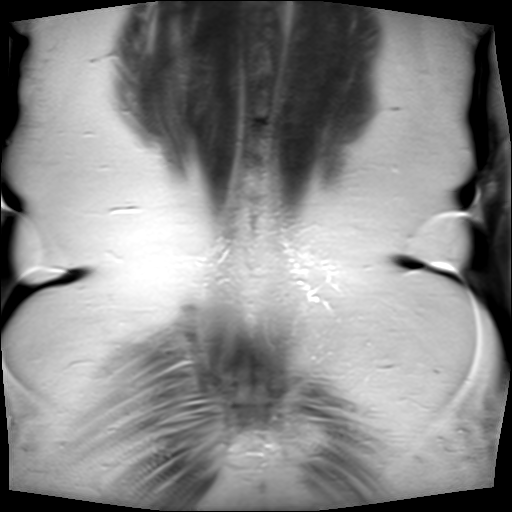

[Series 3: T1 · axial · 6.0mm · 0.68mm/px · z∈[+109,+246]mm · 4 of 44 slices shown]
[im 1/44]
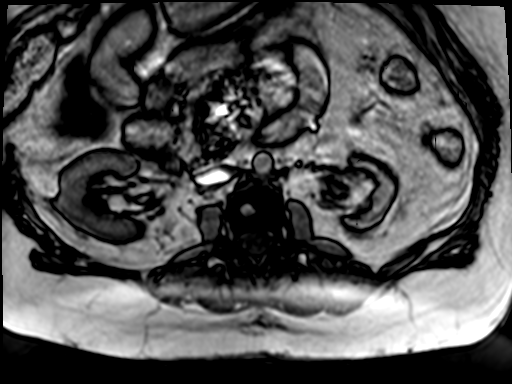
[im 15/44]
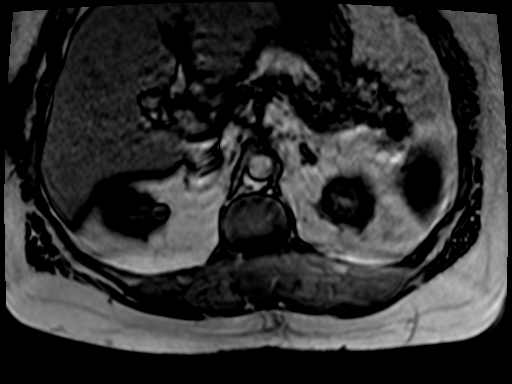
[im 29/44]
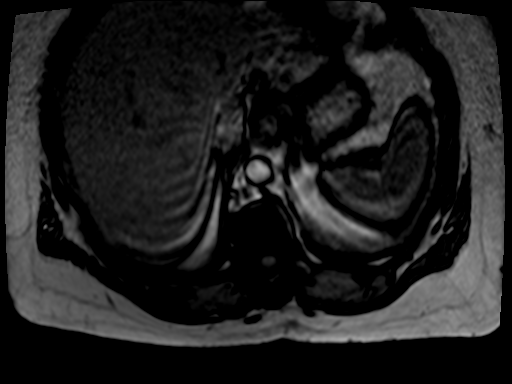
[im 44/44]
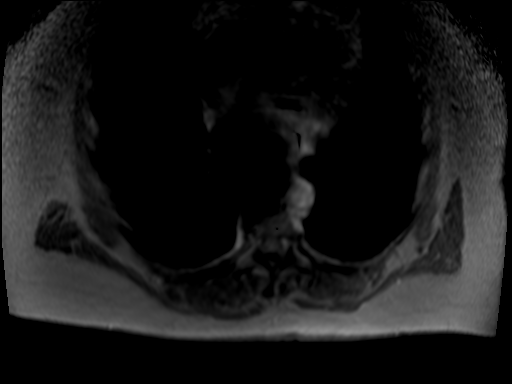

[Series 4: T2 fat-sat · axial · 5.0mm · 1.33mm/px · z∈[-173,+229]mm · 8 of 68 slices shown]
[im 1/68]
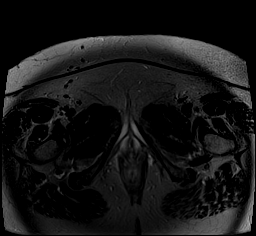
[im 10/68]
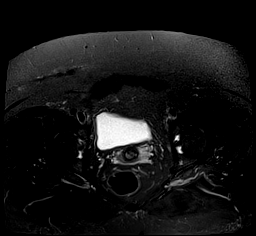
[im 20/68]
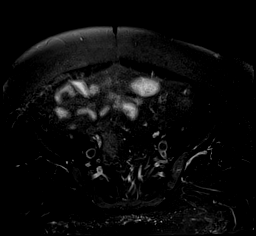
[im 29/68]
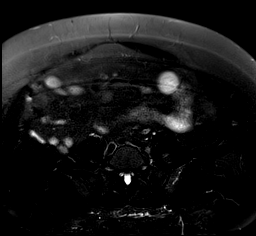
[im 39/68]
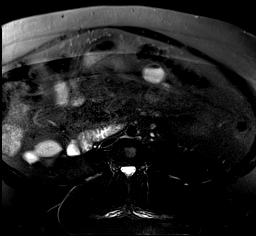
[im 48/68]
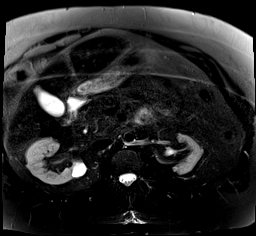
[im 58/68]
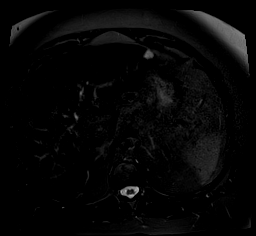
[im 68/68]
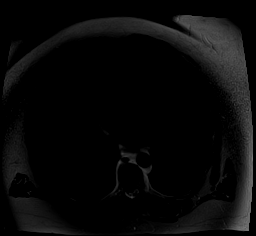

[Series 5: T2 · axial · 5.0mm · 1.33mm/px · z∈[-180,+228]mm · 8 of 69 slices shown]
[im 1/69]
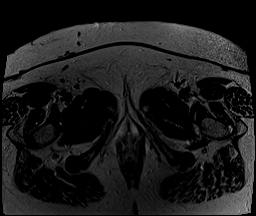
[im 10/69]
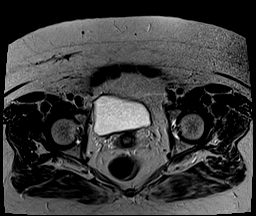
[im 20/69]
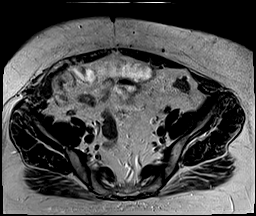
[im 30/69]
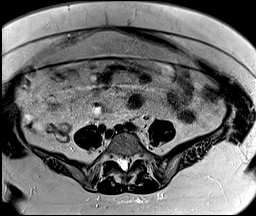
[im 39/69]
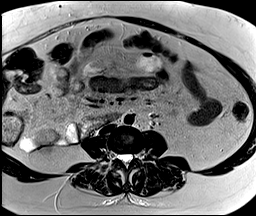
[im 49/69]
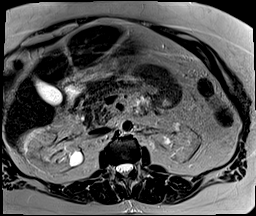
[im 59/69]
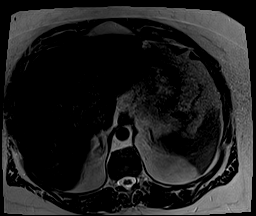
[im 69/69]
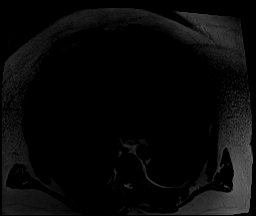

[Series 7: T1 dynamic · axial · non-contrast · 2.3mm · 1.37mm/px · z∈[+16,+216]mm · 10 of 88 slices shown (1 of 2)]
[im 1/88]
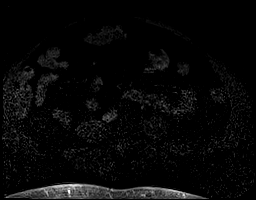
[im 10/88]
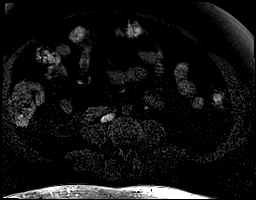
[im 20/88]
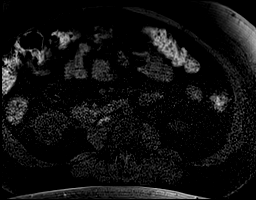
[im 30/88]
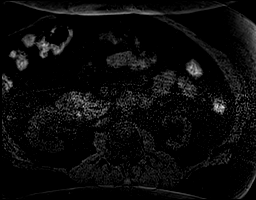
[im 39/88]
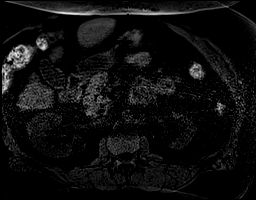
[im 49/88]
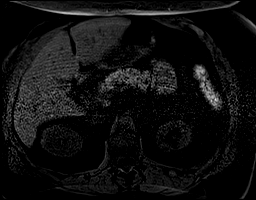
[im 59/88]
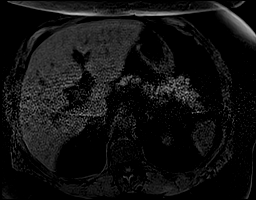
[im 68/88]
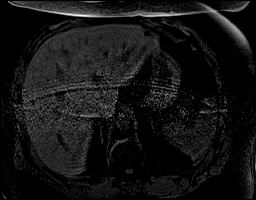
[im 78/88]
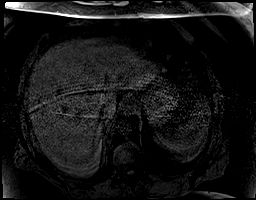
[im 88/88]
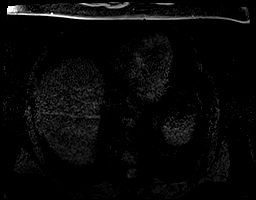

[Series 8: T1 dynamic · axial · non-contrast · 2.3mm · 1.37mm/px · z∈[-161,+39]mm · 10 of 88 slices shown (2 of 2)]
[im 1/88]
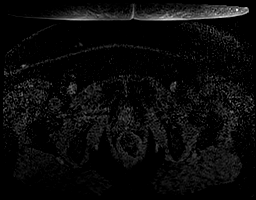
[im 10/88]
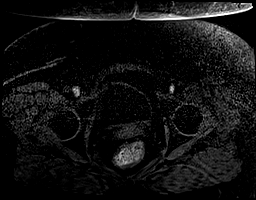
[im 20/88]
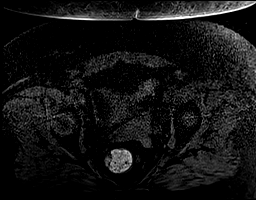
[im 30/88]
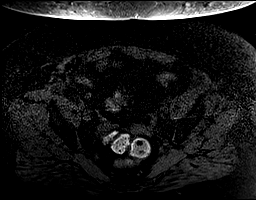
[im 39/88]
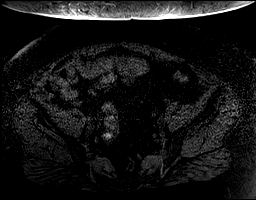
[im 49/88]
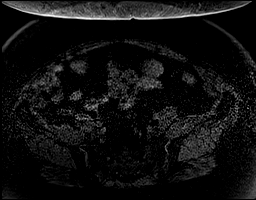
[im 59/88]
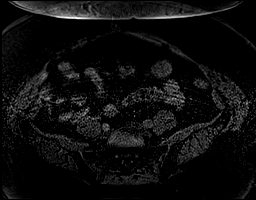
[im 68/88]
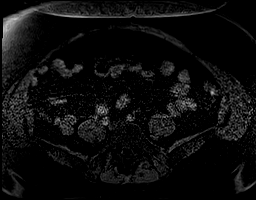
[im 78/88]
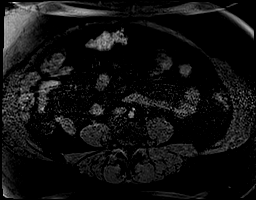
[im 88/88]
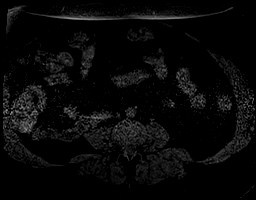

[42 of 48 positions shown; findings below may reference images not displayed]

FINDINGS: Comment: Today's study is limited for detection and characterization
of visceral and/or vascular lesions by lack of IV gadolinium.

Lower chest: Unremarkable.

Hepatobiliary: Mild diffuse loss of signal intensity throughout the
hepatic parenchyma on out of phase dual echo images, indicative of a
background of hepatic steatosis. 1.5 cm T1 hypointense T2
hyperintense lesion in the periphery of segment 2 of the liver
(axial image 11 of series 4), incompletely characterized on today's
noncontrast examination, but statistically likely a cyst. No other
definite aggressive appearing hepatic lesions are noted. No intra or
extrahepatic biliary ductal dilatation. Gallbladder is normal in
appearance. Common bile duct measures 4 mm in the porta hepatis.

Pancreas: No definite pancreatic mass identified on today's
noncontrast examination. No pancreatic ductal dilatation. No
pancreatic or peripancreatic fluid collections or inflammatory
changes.

Spleen:  Unremarkable.

Adrenals/Urinary Tract: T1 hypointense, T2 hyperintense lesions in
both kidneys, incompletely characterized on today's noncontrast
examination, but statistically likely to represent cysts, largest of
which measures 2.1 cm in the medial aspect of the interpolar region
of the right kidney. No other more aggressive appearing renal
lesions are noted. Mild left renal atrophy. No
hydroureteronephrosis. Urinary bladder is grossly unremarkable in
appearance on today's noncontrast examination.

Stomach/Bowel: Stomach is largely decompressed and unremarkable in
appearance. Within the limitations of today's examination (magnetic
resonance is poor for evaluation of bowel abnormalities) there is no
pathologic dilatation of small bowel or colon. Numerous colonic
diverticulae are noted, particularly in the sigmoid colon, without
definite surrounding inflammatory changes to indicate an acute
diverticulitis.

Vascular/Lymphatic: No aneurysm identified in the visualized
abdominal or pelvic vasculature. No lymphadenopathy noted in the
abdomen or pelvis.

Other: No significant volume of ascites noted in the peritoneal
cavity.

Musculoskeletal: No aggressive appearing osseous lesions are noted
in the visualized portions of the skeleton.
IMPRESSION: 1. No explanation for the patient's hematuria noted on today's
noncontrast examination.
2. Multiple small cystic lesions in the kidneys bilaterally are
simple in appearance on today's noncontrast CT examination, likely
to represent benign cysts (Bosniak class 1 and Bosniak class 2).
3. Hepatic steatosis.
4. Colonic diverticulosis.

## 2022-06-01 IMAGING — MR MR PELVIS W/O CM
6 series · 40 of 48 positions shown · non-contrast
Comparison: No prior abdominal or pelvic MRI. CT the abdomen and
pelvis [DATE].

CLINICAL DATA: 70-year-old female with history of gross hematuria.

EXAM:
MRI ABDOMEN WITHOUT CONTRAST
TECHNIQUE: Multiplanar multisequence MR imaging was performed without the
administration of intravenous contrast.

[Series 4: cor haste- abd/ · coronal · 6.0mm · 0.74mm/px · 3 of 26 slices shown]
[im 1/26]
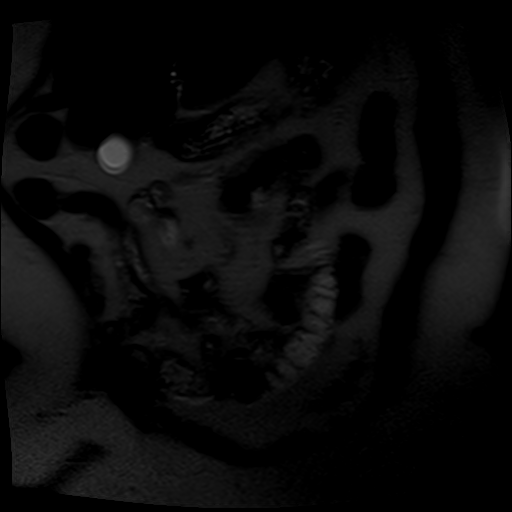
[im 13/26]
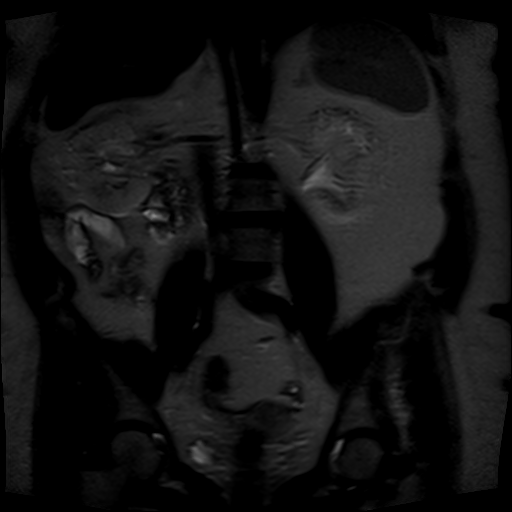
[im 26/26]
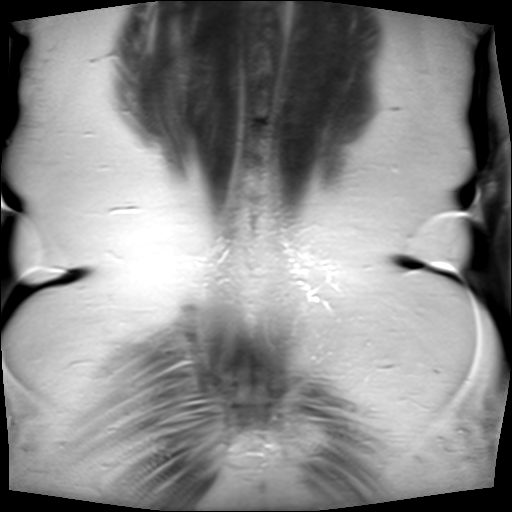

[Series 6: T1 · axial · 6.0mm · 0.68mm/px · z∈[-169,+250]mm · 9 of 130 slices shown]
[im 1/130]
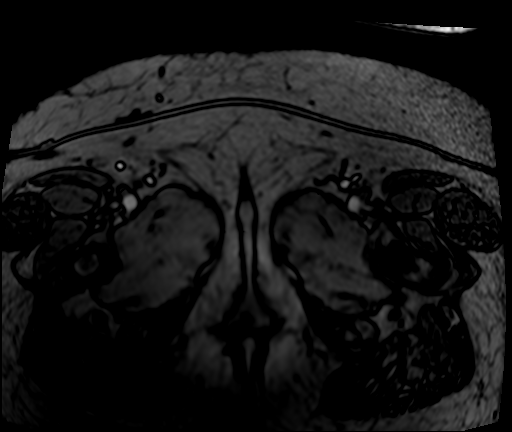
[im 22/130]
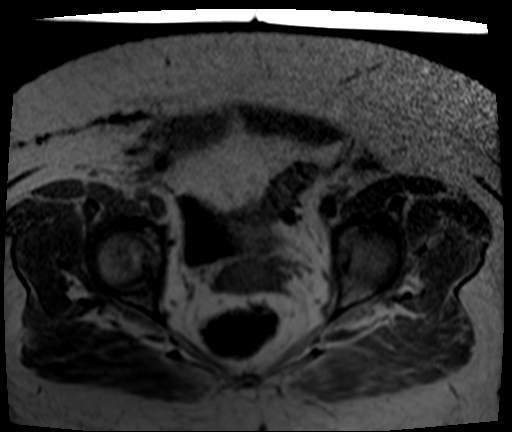
[im 44/130]
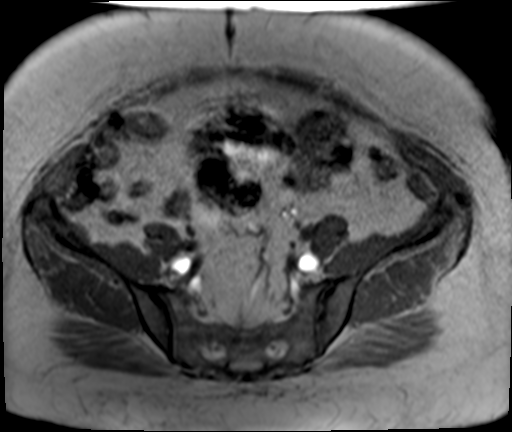
[im 54/130]
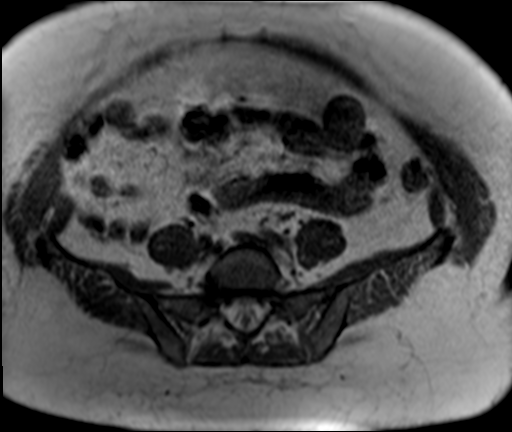
[im 65/130]
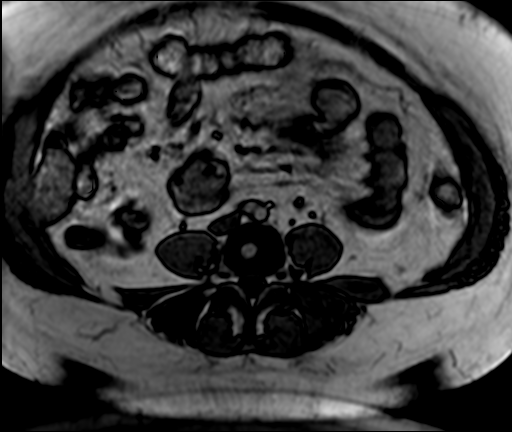
[im 76/130]
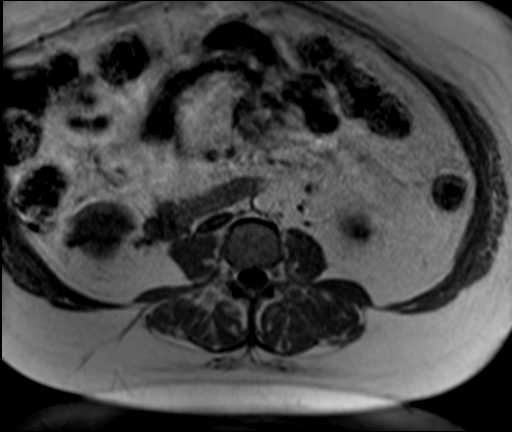
[im 87/130]
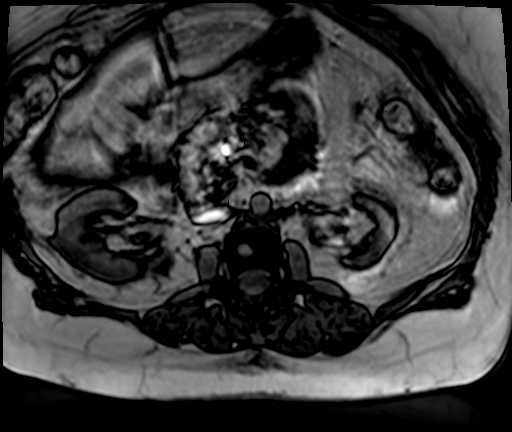
[im 108/130]
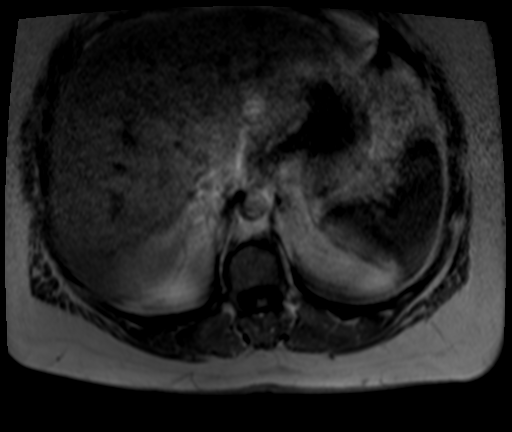
[im 130/130]
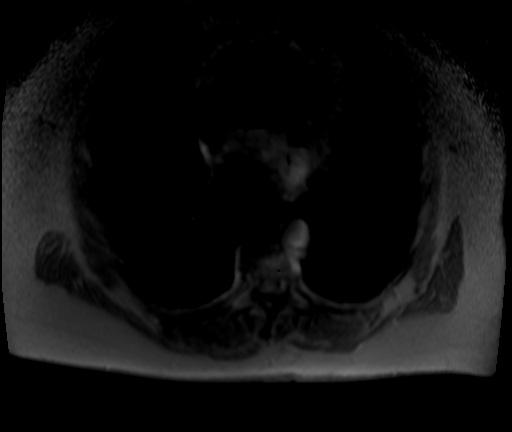

[Series 7: T2 fat-sat · axial · 5.0mm · 1.33mm/px · z∈[-173,+229]mm · 7 of 68 slices shown]
[im 1/68]
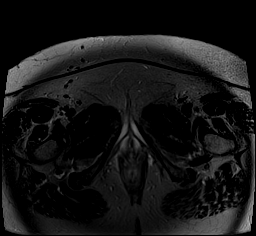
[im 12/68]
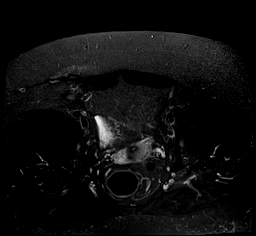
[im 23/68]
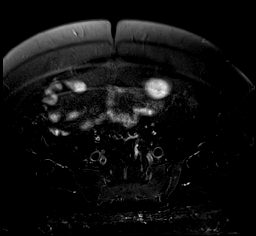
[im 34/68]
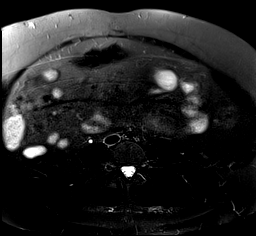
[im 45/68]
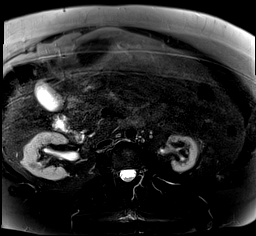
[im 56/68]
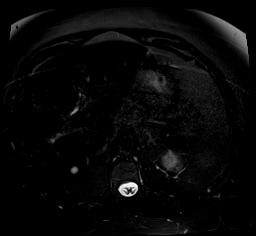
[im 68/68]
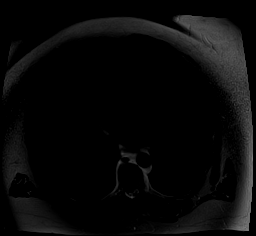

[Series 8: T2 · axial · 5.0mm · 1.33mm/px · z∈[-180,+228]mm · 7 of 69 slices shown]
[im 1/69]
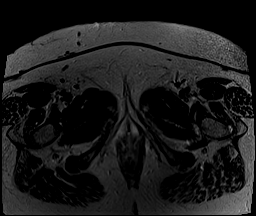
[im 12/69]
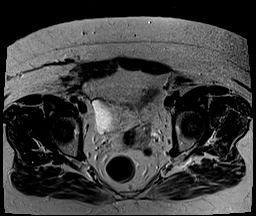
[im 23/69]
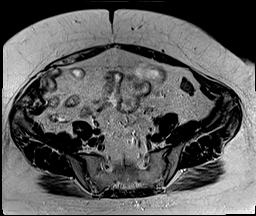
[im 35/69]
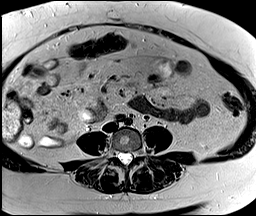
[im 46/69]
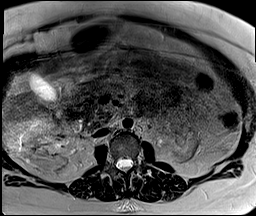
[im 57/69]
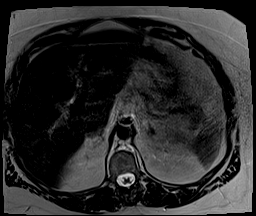
[im 69/69]
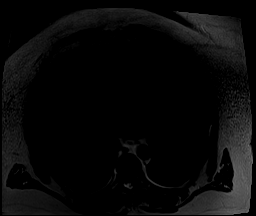

[Series 11: T1 dynamic · axial · non-contrast · 2.3mm · 1.37mm/px · z∈[+16,+216]mm · 8 of 88 slices shown (1 of 2)]
[im 1/88]
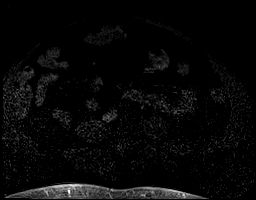
[im 11/88]
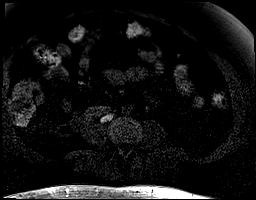
[im 22/88]
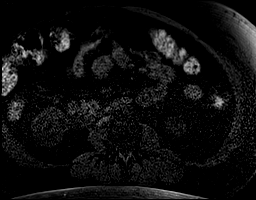
[im 33/88]
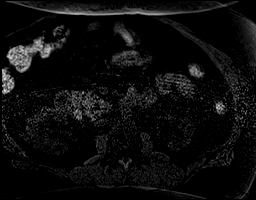
[im 55/88]
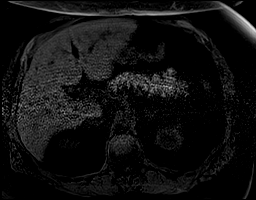
[im 66/88]
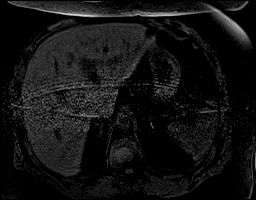
[im 77/88]
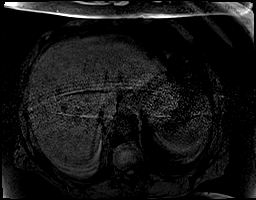
[im 88/88]
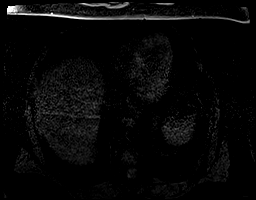

[Series 12: T1 dynamic · axial · non-contrast · 2.3mm · 1.37mm/px · z∈[-161,-12]mm · 6 of 88 slices shown (2 of 2)]
[im 1/88]
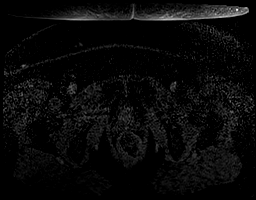
[im 11/88]
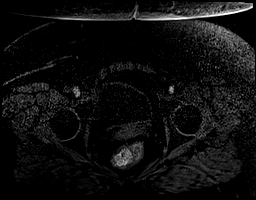
[im 22/88]
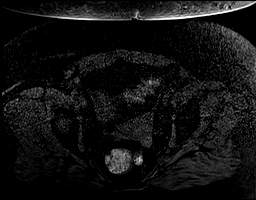
[im 33/88]
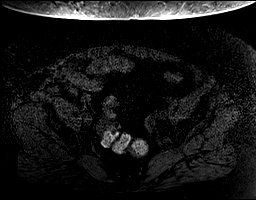
[im 55/88]
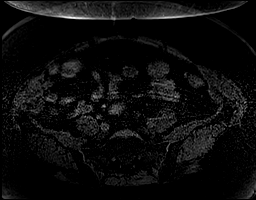
[im 66/88]
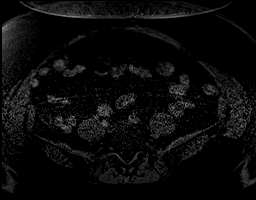

[40 of 48 positions shown; findings below may reference images not displayed]

FINDINGS: Comment: Today's study is limited for detection and characterization
of visceral and/or vascular lesions by lack of IV gadolinium.

Lower chest: Unremarkable.

Hepatobiliary: Mild diffuse loss of signal intensity throughout the
hepatic parenchyma on out of phase dual echo images, indicative of a
background of hepatic steatosis. 1.5 cm T1 hypointense T2
hyperintense lesion in the periphery of segment 2 of the liver
(axial image 11 of series 4), incompletely characterized on today's
noncontrast examination, but statistically likely a cyst. No other
definite aggressive appearing hepatic lesions are noted. No intra or
extrahepatic biliary ductal dilatation. Gallbladder is normal in
appearance. Common bile duct measures 4 mm in the porta hepatis.

Pancreas: No definite pancreatic mass identified on today's
noncontrast examination. No pancreatic ductal dilatation. No
pancreatic or peripancreatic fluid collections or inflammatory
changes.

Spleen:  Unremarkable.

Adrenals/Urinary Tract: T1 hypointense, T2 hyperintense lesions in
both kidneys, incompletely characterized on today's noncontrast
examination, but statistically likely to represent cysts, largest of
which measures 2.1 cm in the medial aspect of the interpolar region
of the right kidney. No other more aggressive appearing renal
lesions are noted. Mild left renal atrophy. No
hydroureteronephrosis. Urinary bladder is grossly unremarkable in
appearance on today's noncontrast examination.

Stomach/Bowel: Stomach is largely decompressed and unremarkable in
appearance. Within the limitations of today's examination (magnetic
resonance is poor for evaluation of bowel abnormalities) there is no
pathologic dilatation of small bowel or colon. Numerous colonic
diverticulae are noted, particularly in the sigmoid colon, without
definite surrounding inflammatory changes to indicate an acute
diverticulitis.

Vascular/Lymphatic: No aneurysm identified in the visualized
abdominal or pelvic vasculature. No lymphadenopathy noted in the
abdomen or pelvis.

Other: No significant volume of ascites noted in the peritoneal
cavity.

Musculoskeletal: No aggressive appearing osseous lesions are noted
in the visualized portions of the skeleton.
IMPRESSION: 1. No explanation for the patient's hematuria noted on today's
noncontrast examination.
2. Multiple small cystic lesions in the kidneys bilaterally are
simple in appearance on today's noncontrast CT examination, likely
to represent benign cysts (Bosniak class 1 and Bosniak class 2).
3. Hepatic steatosis.
4. Colonic diverticulosis.

## 2022-06-01 NOTE — Progress Notes (Signed)
Subjective:  Patient ID: Jed Limerick, female    DOB: 1951/03/06,  MRN: [id number]  Nicole Defino Burback presents to clinic today for at risk foot care with history of diabetic neuropathy and callus(es) bilateral great toes and painful thick toenails that are difficult to trim. Painful toenails interfere with ambulation. Aggravating factors include wearing enclosed shoe gear. Pain is relieved with periodic professional debridement. Painful calluses are aggravated when weightbearing with and without shoegear. Pain is relieved with periodic professional debridement.  Patient states blood glucose was 161 mg/dl today.    New problem(s): None.   PCP is Elby Showers, MD , and last visit was March 18, 2022.  Allergies  Allergen Reactions   Fish Oil Shortness Of Breath    Other reaction(s): Unknown   Metformin     Other reaction(s): Other (See Comments), Unknown "Facial puffiness"  Pt stated her kidney specialist told her to add metformin to her drug allergies list.    Metformin And Related Other (See Comments)    KIDNEY FAILURE?   Fenofibrate     fever   Ciprocin-Fluocin-Procin [Fluocinolone]     Acute kidney failure   Ciprofloxacin Other (See Comments)    Kidney failure Other reaction(s): Unknown Other reaction(s): Unknown   Fenofibrate     Other reaction(s): fever   Insulin Degludec Other (See Comments)   Insulins Other (See Comments)    Migraines, nausea Other reaction(s): Other (See Comments), Unknown Migraines, nausea NOVLOG    Niacin     Other reaction(s): Unknown   Orange Fruit [Citrus]    Ramipril Cough   Ramipril     Other reaction(s): cough   Sulfamethoxazole-Trimethoprim     Other reaction(s): Unknown   Sulfa Antibiotics Rash    Review of Systems: Negative except as noted in the HPI.  Objective: No changes noted in today's physical examination. There were no vitals filed for this visit.  Kanae Ignatowski is a pleasant 71 y.o. female in  NAD. AAO X 3.  Vascular Examination: Capillary refill time to digits immediate b/l. Palpable pedal pulses b/l LE. Pedal hair sparse. No pain with calf compression b/l. Lower extremity skin temperature gradient within normal limits. No edema noted b/l LE. No cyanosis or clubbing noted b/l LE.  Dermatological Examination: Pedal integument with normal turgor, texture and tone BLE. No open wounds b/l LE. No interdigital macerations noted b/l LE. Toenails 1-5 b/l elongated, discolored, dystrophic, thickened, crumbly with subungual debris and tenderness to dorsal palpation. Hyperkeratotic lesion(s) bilateral great toes and submet head 1 left foot.  No erythema, no edema, no drainage, no fluctuance.  Neurological Examination: Protective sensation intact 5/5 intact bilaterally with 10g monofilament b/l. Vibratory sensation intact b/l.  Musculoskeletal Examination: Muscle strength 5/5 to all lower extremity muscle groups bilaterally. HAV with bunion deformity noted b/l LE.     Latest Ref Rng & Units 03/15/2022    9:07 AM 10/06/2021   12:48 AM 06/26/2021   12:23 PM  Hemoglobin A1C  Hemoglobin-A1c <5.7 % of total Hgb 8.2   7.6   6.8     Assessment/Plan: 1. Pain due to onychomycosis of toenails of both feet   2. Callus   3. Diabetic peripheral neuropathy associated with type 2 diabetes mellitus ([place])     -Examined patient. -No new findings. No new orders. -Continue foot and shoe inspections daily. Monitor blood glucose per PCP/Endocrinologist's recommendations. -Toenails 1-5 b/l were debrided in length and girth with sterile nail nippers and dremel without iatrogenic bleeding.  -  Corn(s) bilateral great toes pared utilizing sterile scalpel blade without complication or incident. Total number debrided=2. -Patient/POA to call should there be question/concern in the interim.   Return in about 3 months (around 08/28/2022).  Marzetta Board, DPM

## 2022-07-08 ENCOUNTER — Telehealth: Payer: Self-pay | Admitting: Pulmonary Disease

## 2022-07-08 DIAGNOSIS — G4733 Obstructive sleep apnea (adult) (pediatric): Secondary | ICD-10-CM

## 2022-07-08 NOTE — Telephone Encounter (Signed)
Dr. Halford Chessman are you ok with [place] printing off a CPAP order for this patient so she can order offline? Im sending this message to [facility] triage as well since Dr. Halford Chessman is out there this week and Meagan can take care of it and get him to sign it.

## 2022-07-08 NOTE — Telephone Encounter (Signed)
I have entered information into Epic order.

## 2022-07-09 NOTE — Telephone Encounter (Signed)
Order signed and faxed to [phone]. Called and notified patient of order being faxed. Nothing further needed at this time.

## 2022-07-18 DIAGNOSIS — E063 Autoimmune thyroiditis: Secondary | ICD-10-CM | POA: Diagnosis not present

## 2022-07-18 DIAGNOSIS — E1165 Type 2 diabetes mellitus with hyperglycemia: Secondary | ICD-10-CM | POA: Diagnosis not present

## 2022-07-18 DIAGNOSIS — E790 Hyperuricemia without signs of inflammatory arthritis and tophaceous disease: Secondary | ICD-10-CM | POA: Diagnosis not present

## 2022-07-18 DIAGNOSIS — E7800 Pure hypercholesterolemia, unspecified: Secondary | ICD-10-CM | POA: Diagnosis not present

## 2022-07-18 DIAGNOSIS — N184 Chronic kidney disease, stage 4 (severe): Secondary | ICD-10-CM | POA: Diagnosis not present

## 2022-07-18 DIAGNOSIS — M2550 Pain in unspecified joint: Secondary | ICD-10-CM | POA: Diagnosis not present

## 2022-07-18 DIAGNOSIS — I10 Essential (primary) hypertension: Secondary | ICD-10-CM | POA: Diagnosis not present

## 2022-08-05 DIAGNOSIS — I10 Essential (primary) hypertension: Secondary | ICD-10-CM | POA: Diagnosis not present

## 2022-08-05 DIAGNOSIS — E781 Pure hyperglyceridemia: Secondary | ICD-10-CM | POA: Diagnosis not present

## 2022-08-16 DIAGNOSIS — N184 Chronic kidney disease, stage 4 (severe): Secondary | ICD-10-CM | POA: Diagnosis not present

## 2022-08-16 DIAGNOSIS — G4733 Obstructive sleep apnea (adult) (pediatric): Secondary | ICD-10-CM | POA: Diagnosis not present

## 2022-08-16 DIAGNOSIS — Z01818 Encounter for other preprocedural examination: Secondary | ICD-10-CM | POA: Diagnosis not present

## 2022-08-16 DIAGNOSIS — E1122 Type 2 diabetes mellitus with diabetic chronic kidney disease: Secondary | ICD-10-CM | POA: Diagnosis not present

## 2022-08-16 DIAGNOSIS — I120 Hypertensive chronic kidney disease with stage 5 chronic kidney disease or end stage renal disease: Secondary | ICD-10-CM | POA: Diagnosis not present

## 2022-08-16 DIAGNOSIS — Z7682 Awaiting organ transplant status: Secondary | ICD-10-CM | POA: Diagnosis not present

## 2022-08-16 DIAGNOSIS — E785 Hyperlipidemia, unspecified: Secondary | ICD-10-CM | POA: Diagnosis not present

## 2022-08-16 DIAGNOSIS — N135 Crossing vessel and stricture of ureter without hydronephrosis: Secondary | ICD-10-CM | POA: Diagnosis not present

## 2022-08-16 DIAGNOSIS — N186 End stage renal disease: Secondary | ICD-10-CM | POA: Diagnosis not present

## 2022-08-16 DIAGNOSIS — E1121 Type 2 diabetes mellitus with diabetic nephropathy: Secondary | ICD-10-CM | POA: Diagnosis not present

## 2022-08-16 DIAGNOSIS — E781 Pure hyperglyceridemia: Secondary | ICD-10-CM | POA: Diagnosis not present

## 2022-09-03 ENCOUNTER — Ambulatory Visit
Admission: RE | Admit: 2022-09-03 | Discharge: 2022-09-03 | Disposition: A | Discharge status: Home / Self Care | Payer: Medicare PPO | Source: Ambulatory Visit | Attending: Internal Medicine | Admitting: Internal Medicine

## 2022-09-03 DIAGNOSIS — M85852 Other specified disorders of bone density and structure, left thigh: Secondary | ICD-10-CM | POA: Diagnosis not present

## 2022-09-03 DIAGNOSIS — Z780 Asymptomatic menopausal state: Secondary | ICD-10-CM | POA: Diagnosis not present

## 2022-09-18 ENCOUNTER — Ambulatory Visit: Payer: Medicare PPO | Admitting: Podiatry

## 2022-09-18 ENCOUNTER — Encounter: Payer: Self-pay | Admitting: Podiatry

## 2022-09-18 DIAGNOSIS — L84 Corns and callosities: Secondary | ICD-10-CM

## 2022-09-18 DIAGNOSIS — M79674 Pain in right toe(s): Secondary | ICD-10-CM | POA: Diagnosis not present

## 2022-09-18 DIAGNOSIS — E1142 Type 2 diabetes mellitus with diabetic polyneuropathy: Secondary | ICD-10-CM | POA: Diagnosis not present

## 2022-09-18 DIAGNOSIS — M79675 Pain in left toe(s): Secondary | ICD-10-CM

## 2022-09-18 DIAGNOSIS — B351 Tinea unguium: Secondary | ICD-10-CM

## 2022-09-23 NOTE — Progress Notes (Signed)
Subjective:  Patient ID: [name], female    DOB: 1951-05-25,  MRN: [id number]  [name] presents to clinic today for at risk foot care with history of diabetic neuropathy and callus(es) b/l lower extremities and painful thick toenails that are difficult to trim. Painful toenails interfere with ambulation. Aggravating factors include wearing enclosed shoe gear. Pain is relieved with periodic professional debridement. Painful calluses are aggravated when weightbearing with and without shoegear. Pain is relieved with periodic professional debridement.  Patient states blood glucose was 145 mg/dl today. Last known  HgA1c was 7.78%.    New problem(s): None.   PCP is Debbie Showers, [name] , and last visit was  March 18, 2022.  Allergies  Allergen Reactions   Fish Oil Shortness Of Breath    Other reaction(s): Unknown   Metformin     Other reaction(s): Other (See Comments), Unknown "Facial puffiness"  Pt stated her kidney specialist told her to add metformin to her drug allergies list.    Metformin And Related Other (See Comments)    KIDNEY FAILURE?   Fenofibrate     fever   Ciprocin-Fluocin-Procin [Fluocinolone]     Acute kidney failure   Ciprofloxacin Other (See Comments)    Kidney failure Other reaction(s): Unknown Other reaction(s): Unknown   Fenofibrate     Other reaction(s): fever   Insulin Degludec Other (See Comments)   Insulins Other (See Comments)    Migraines, nausea Other reaction(s): Other (See Comments), Unknown Migraines, nausea NOVLOG    Niacin     Other reaction(s): Unknown   Orange Fruit [Citrus]    Ramipril Cough   Ramipril     Other reaction(s): cough   Sulfamethoxazole-Trimethoprim     Other reaction(s): Unknown   Sulfa Antibiotics Rash    Review of Systems: Negative except as noted in the HPI.  Objective: No changes noted in today's physical examination. [name] is a pleasant 71 y.o. female in NAD. AAO x  3.  Vascular Examination: Capillary refill time to digits immediate b/l. Palpable pedal pulses b/l LE. Pedal hair sparse. No pain with calf compression b/l. Lower extremity skin temperature gradient within normal limits. No edema noted b/l LE. No cyanosis or clubbing noted b/l LE.  Dermatological Examination: Pedal integument with normal turgor, texture and tone BLE. No open wounds b/l LE. No interdigital macerations noted b/l LE. Toenails 1-5 b/l elongated, discolored, dystrophic, thickened, crumbly with subungual debris and tenderness to dorsal palpation. Hyperkeratotic lesion(s) medial IPJ bilateral great toes and submet head 1 left foot.  No erythema, no edema, no drainage, no fluctuance.  Neurological Examination: Protective sensation intact 5/5 intact bilaterally with 10g monofilament b/l. Vibratory sensation intact b/l.  Musculoskeletal Examination: Muscle strength 5/5 to all lower extremity muscle groups bilaterally. HAV with bunion deformity noted b/l LE.  Assessment/Plan: 1. Pain due to onychomycosis of toenails of both feet   2. Callus   3. Diabetic peripheral neuropathy associated with type 2 diabetes mellitus ([name])     -Patient was evaluated and treated. All patient's and/or POA's questions/concerns answered on today's visit. -Patient to continue soft, supportive shoe gear daily. -Mycotic toenails 1-5 bilaterally were debrided in length and girth with sterile nail nippers and dremel without incident. -Callus(es) medial IPJ of left great toe, medial IPJ of right great toe, and submet head 1 left foot pared utilizing sterile scalpel blade without complication or incident. Total number debrided =3. -Patient/POA to call should there be question/concern in the interim.  Return in about 3 months (around 12/18/2022).  Marzetta Board, DPM

## 2022-09-24 ENCOUNTER — Other Ambulatory Visit (HOSPITAL_BASED_OUTPATIENT_CLINIC_OR_DEPARTMENT_OTHER): Payer: Self-pay

## 2022-09-24 DIAGNOSIS — N390 Urinary tract infection, site not specified: Secondary | ICD-10-CM | POA: Diagnosis not present

## 2022-09-24 DIAGNOSIS — N184 Chronic kidney disease, stage 4 (severe): Secondary | ICD-10-CM | POA: Diagnosis not present

## 2022-09-24 MED ORDER — INFLUENZA VAC A&B SA ADJ QUAD 0.5 ML IM PRSY
PREFILLED_SYRINGE | INTRAMUSCULAR | 0 refills | Status: AC
  Filled 2022-09-24: qty 0.5, 1d supply, fill #0

## 2022-09-26 DIAGNOSIS — R809 Proteinuria, unspecified: Secondary | ICD-10-CM | POA: Diagnosis not present

## 2022-09-26 DIAGNOSIS — H524 Presbyopia: Secondary | ICD-10-CM | POA: Diagnosis not present

## 2022-09-26 DIAGNOSIS — E1122 Type 2 diabetes mellitus with diabetic chronic kidney disease: Secondary | ICD-10-CM | POA: Diagnosis not present

## 2022-09-26 DIAGNOSIS — N179 Acute kidney failure, unspecified: Secondary | ICD-10-CM | POA: Diagnosis not present

## 2022-09-26 DIAGNOSIS — N184 Chronic kidney disease, stage 4 (severe): Secondary | ICD-10-CM | POA: Diagnosis not present

## 2022-09-26 DIAGNOSIS — E785 Hyperlipidemia, unspecified: Secondary | ICD-10-CM | POA: Diagnosis not present

## 2022-09-26 DIAGNOSIS — Z961 Presence of intraocular lens: Secondary | ICD-10-CM | POA: Diagnosis not present

## 2022-09-26 DIAGNOSIS — I129 Hypertensive chronic kidney disease with stage 1 through stage 4 chronic kidney disease, or unspecified chronic kidney disease: Secondary | ICD-10-CM | POA: Diagnosis not present

## 2022-09-26 DIAGNOSIS — E875 Hyperkalemia: Secondary | ICD-10-CM | POA: Diagnosis not present

## 2022-09-26 DIAGNOSIS — E119 Type 2 diabetes mellitus without complications: Secondary | ICD-10-CM | POA: Diagnosis not present

## 2022-09-26 DIAGNOSIS — N2589 Other disorders resulting from impaired renal tubular function: Secondary | ICD-10-CM | POA: Diagnosis not present

## 2022-09-26 DIAGNOSIS — N2581 Secondary hyperparathyroidism of renal origin: Secondary | ICD-10-CM | POA: Diagnosis not present

## 2022-09-26 LAB — HM DIABETES EYE EXAM

## 2022-09-27 ENCOUNTER — Encounter: Payer: Self-pay | Admitting: Internal Medicine

## 2022-10-29 ENCOUNTER — Ambulatory Visit (HOSPITAL_BASED_OUTPATIENT_CLINIC_OR_DEPARTMENT_OTHER): Payer: Medicare PPO | Admitting: Nurse Practitioner

## 2022-11-04 DIAGNOSIS — D485 Neoplasm of uncertain behavior of skin: Secondary | ICD-10-CM | POA: Diagnosis not present

## 2022-11-04 DIAGNOSIS — D225 Melanocytic nevi of trunk: Secondary | ICD-10-CM | POA: Diagnosis not present

## 2022-11-04 DIAGNOSIS — Z85820 Personal history of malignant melanoma of skin: Secondary | ICD-10-CM | POA: Diagnosis not present

## 2022-11-04 DIAGNOSIS — L821 Other seborrheic keratosis: Secondary | ICD-10-CM | POA: Diagnosis not present

## 2022-11-04 DIAGNOSIS — L814 Other melanin hyperpigmentation: Secondary | ICD-10-CM | POA: Diagnosis not present

## 2022-11-04 DIAGNOSIS — Z808 Family history of malignant neoplasm of other organs or systems: Secondary | ICD-10-CM | POA: Diagnosis not present

## 2022-11-04 DIAGNOSIS — L570 Actinic keratosis: Secondary | ICD-10-CM | POA: Diagnosis not present

## 2022-11-04 DIAGNOSIS — L578 Other skin changes due to chronic exposure to nonionizing radiation: Secondary | ICD-10-CM | POA: Diagnosis not present

## 2022-11-05 DIAGNOSIS — K5904 Chronic idiopathic constipation: Secondary | ICD-10-CM | POA: Diagnosis not present

## 2022-11-05 DIAGNOSIS — Z86010 Personal history of colonic polyps: Secondary | ICD-10-CM | POA: Diagnosis not present

## 2022-11-05 DIAGNOSIS — K219 Gastro-esophageal reflux disease without esophagitis: Secondary | ICD-10-CM | POA: Diagnosis not present

## 2022-11-05 DIAGNOSIS — G4733 Obstructive sleep apnea (adult) (pediatric): Secondary | ICD-10-CM | POA: Diagnosis not present

## 2022-11-05 DIAGNOSIS — Z1211 Encounter for screening for malignant neoplasm of colon: Secondary | ICD-10-CM | POA: Diagnosis not present

## 2022-11-25 DIAGNOSIS — R9439 Abnormal result of other cardiovascular function study: Secondary | ICD-10-CM | POA: Diagnosis not present

## 2022-11-25 DIAGNOSIS — I120 Hypertensive chronic kidney disease with stage 5 chronic kidney disease or end stage renal disease: Secondary | ICD-10-CM | POA: Diagnosis not present

## 2022-11-25 DIAGNOSIS — Z01810 Encounter for preprocedural cardiovascular examination: Secondary | ICD-10-CM | POA: Diagnosis not present

## 2022-11-25 DIAGNOSIS — N186 End stage renal disease: Secondary | ICD-10-CM | POA: Diagnosis not present

## 2022-12-19 ENCOUNTER — Other Ambulatory Visit: Payer: Self-pay | Admitting: Gastroenterology

## 2022-12-25 DIAGNOSIS — N184 Chronic kidney disease, stage 4 (severe): Secondary | ICD-10-CM | POA: Diagnosis not present

## 2022-12-25 DIAGNOSIS — N390 Urinary tract infection, site not specified: Secondary | ICD-10-CM | POA: Diagnosis not present

## 2022-12-27 DIAGNOSIS — E875 Hyperkalemia: Secondary | ICD-10-CM | POA: Diagnosis not present

## 2022-12-27 DIAGNOSIS — E1122 Type 2 diabetes mellitus with diabetic chronic kidney disease: Secondary | ICD-10-CM | POA: Diagnosis not present

## 2022-12-27 DIAGNOSIS — N179 Acute kidney failure, unspecified: Secondary | ICD-10-CM | POA: Diagnosis not present

## 2022-12-27 DIAGNOSIS — E785 Hyperlipidemia, unspecified: Secondary | ICD-10-CM | POA: Diagnosis not present

## 2022-12-27 DIAGNOSIS — N184 Chronic kidney disease, stage 4 (severe): Secondary | ICD-10-CM | POA: Diagnosis not present

## 2022-12-27 DIAGNOSIS — R809 Proteinuria, unspecified: Secondary | ICD-10-CM | POA: Diagnosis not present

## 2022-12-27 DIAGNOSIS — N2589 Other disorders resulting from impaired renal tubular function: Secondary | ICD-10-CM | POA: Diagnosis not present

## 2022-12-27 DIAGNOSIS — N2581 Secondary hyperparathyroidism of renal origin: Secondary | ICD-10-CM | POA: Diagnosis not present

## 2022-12-27 DIAGNOSIS — I129 Hypertensive chronic kidney disease with stage 1 through stage 4 chronic kidney disease, or unspecified chronic kidney disease: Secondary | ICD-10-CM | POA: Diagnosis not present

## 2023-01-01 ENCOUNTER — Other Ambulatory Visit: Payer: Self-pay | Admitting: Internal Medicine

## 2023-01-01 DIAGNOSIS — E7800 Pure hypercholesterolemia, unspecified: Secondary | ICD-10-CM | POA: Diagnosis not present

## 2023-01-01 DIAGNOSIS — I10 Essential (primary) hypertension: Secondary | ICD-10-CM | POA: Diagnosis not present

## 2023-01-01 DIAGNOSIS — E063 Autoimmune thyroiditis: Secondary | ICD-10-CM | POA: Diagnosis not present

## 2023-01-01 DIAGNOSIS — E1165 Type 2 diabetes mellitus with hyperglycemia: Secondary | ICD-10-CM | POA: Diagnosis not present

## 2023-01-01 DIAGNOSIS — M2550 Pain in unspecified joint: Secondary | ICD-10-CM | POA: Diagnosis not present

## 2023-01-01 DIAGNOSIS — Z1231 Encounter for screening mammogram for malignant neoplasm of breast: Secondary | ICD-10-CM

## 2023-01-01 DIAGNOSIS — N184 Chronic kidney disease, stage 4 (severe): Secondary | ICD-10-CM | POA: Diagnosis not present

## 2023-01-01 DIAGNOSIS — E790 Hyperuricemia without signs of inflammatory arthritis and tophaceous disease: Secondary | ICD-10-CM | POA: Diagnosis not present

## 2023-01-06 ENCOUNTER — Ambulatory Visit: Payer: Medicare PPO | Admitting: Podiatry

## 2023-01-06 ENCOUNTER — Encounter: Payer: Self-pay | Admitting: Podiatry

## 2023-01-06 VITALS — BP 134/80

## 2023-01-06 DIAGNOSIS — L84 Corns and callosities: Secondary | ICD-10-CM

## 2023-01-06 DIAGNOSIS — E1142 Type 2 diabetes mellitus with diabetic polyneuropathy: Secondary | ICD-10-CM | POA: Diagnosis not present

## 2023-01-06 DIAGNOSIS — B351 Tinea unguium: Secondary | ICD-10-CM | POA: Diagnosis not present

## 2023-01-06 DIAGNOSIS — M79674 Pain in right toe(s): Secondary | ICD-10-CM | POA: Diagnosis not present

## 2023-01-06 DIAGNOSIS — M79675 Pain in left toe(s): Secondary | ICD-10-CM

## 2023-01-06 NOTE — Progress Notes (Signed)
Subjective:  Patient ID: Jed Limerick, female    DOB: 24-Jul-1951,  MRN: [id number]  Fabiola Mudgett Mentzel presents to clinic today for at risk foot care with history of diabetic neuropathy and callus(es) b/l feet and painful thick toenails that are difficult to trim. Painful toenails interfere with ambulation. Aggravating factors include wearing enclosed shoe gear. Pain is relieved with periodic professional debridement. Painful calluses are aggravated when weightbearing with and without shoegear. Pain is relieved with periodic professional debridement.  Chief Complaint  Patient presents with   Nail Problem    DFC BS-101 A1C-7.2 PCP-Baxley, Mary PCP VST-Last year   New problem(s): None.   PCP is Baxley, Cresenciano Lick, MD.  Allergies  Allergen Reactions   Fish Oil Shortness Of Breath    Other reaction(s): Unknown   Metformin     Other reaction(s): Other (See Comments), Unknown "Facial puffiness"  Pt stated her kidney specialist told her to add metformin to her drug allergies list.    Metformin And Related Other (See Comments)    KIDNEY FAILURE?   Fenofibrate     fever   Ciprocin-Fluocin-Procin [Fluocinolone]     Acute kidney failure   Ciprofloxacin Other (See Comments)    Kidney failure Other reaction(s): Unknown Other reaction(s): Unknown   Fenofibrate     Other reaction(s): fever   Insulin Degludec Other (See Comments)   Insulins Other (See Comments)    Migraines, nausea Other reaction(s): Other (See Comments), Unknown Migraines, nausea NOVLOG    Niacin     Other reaction(s): Unknown   Orange Fruit [Citrus]    Ramipril Cough   Ramipril     Other reaction(s): cough   Sulfamethoxazole-Trimethoprim     Other reaction(s): Unknown   Sulfa Antibiotics Rash    Review of Systems: Negative except as noted in the HPI.  Objective: No changes noted in today's physical examination. Vitals:   01/06/23 1132 01/06/23 1154  BP: (!) 141/72 134/80   Pamalee Marcoe  Jeanlouis is a pleasant 72 y.o. female obese in NAD. AAO x 3.  Vascular Examination: Capillary refill time to digits immediate b/l. Palpable pedal pulses b/l LE. Pedal hair sparse. No pain with calf compression b/l. Lower extremity skin temperature gradient within normal limits. No edema noted b/l LE. No cyanosis or clubbing noted b/l LE.  Dermatological Examination: Pedal integument with normal turgor, texture and tone BLE. No open wounds b/l LE. No interdigital macerations noted b/l LE. Toenails 1-5 b/l elongated, discolored, dystrophic, thickened, crumbly with subungual debris and tenderness to dorsal palpation.   Hyperkeratotic lesion(s) medial IPJ bilateral great toes and submet head 1 left foot.  No erythema, no edema, no drainage, no fluctuance.  Neurological Examination: Protective sensation intact 5/5 intact bilaterally with 10g monofilament b/l. Vibratory sensation intact b/l.  Musculoskeletal Examination: Muscle strength 5/5 to all lower extremity muscle groups bilaterally. HAV with bunion deformity noted b/l LE.  Assessment/Plan: 1. Pain due to onychomycosis of toenails of both feet   2. Callus   3. Diabetic peripheral neuropathy associated with type 2 diabetes mellitus ([place])      -Consent given for treatment as described below: -Examined patient. -Continue supportive shoe gear daily. -Toenails 1-5 b/l were debrided in length and girth with sterile nail nippers and dremel without iatrogenic bleeding.  -Callus(es) bilateral great toes and submet head 1 left foot pared utilizing sharp debridement with sterile blade without complication or incident. Total number debrided =3. -Patient/POA to call should there be question/concern in the interim.  Return in about 3 months (around 04/07/2023).  Marzetta Board, DPM

## 2023-01-14 ENCOUNTER — Telehealth: Payer: Self-pay | Admitting: Internal Medicine

## 2023-01-14 ENCOUNTER — Ambulatory Visit: Payer: Medicare PPO | Admitting: Internal Medicine

## 2023-01-14 ENCOUNTER — Encounter: Payer: Self-pay | Admitting: Internal Medicine

## 2023-01-14 VITALS — BP 132/80 | HR 85 | Temp 98.1°F | Ht 64.0 in | Wt 223.0 lb

## 2023-01-14 DIAGNOSIS — H6502 Acute serous otitis media, left ear: Secondary | ICD-10-CM | POA: Diagnosis not present

## 2023-01-14 DIAGNOSIS — G4733 Obstructive sleep apnea (adult) (pediatric): Secondary | ICD-10-CM | POA: Diagnosis not present

## 2023-01-14 MED ORDER — AZITHROMYCIN 250 MG PO TABS: ORAL_TABLET | ORAL | 0 refills | Status: DC

## 2023-01-14 NOTE — Progress Notes (Signed)
   Subjective:    Patient ID: [name], female    DOB: 02-11-1951, 72 y.o.   MRN: [id number]  HPI  72 year Female seen for left ear pain. No fever or chills. No cough.  No sore throat.  Patient has been in good health until just recently when she came down with the earache.  She has a history of chronic kidney disease.  She is followed at [facility].  She had a 2D echocardiogram November 2023 showing normal left ventricular systolic function with mild LVH and normal right ventricular systolic function.  Trivial mitral regurgitation, trivial tricuspid regurgitation and no valvular stenosis.  She saw cardiologist at [facility] in August 2023 regarding hypertension and hypertriglyceridemia.  She is inactive on the renal transplant list.  She has sleep apnea followed by pulmonary.  Sees Dr. Michiel Sites, endocrinologist agrees for medical Associates for diabetes mellitus.  History of idiopathic chronic gout followed by [facility] rheumatology.  History of chronic kidney disease stage IV.  History of tubular adenoma 2019 on colonoscopy by Dr. Collene Mares.  History of osteoporosis but has not wanted to be on therapy.  Dr. Lorrene Reid had mentioned to her that she could take Prolia but thus far patient has declined.  Bone density study in September 2023 showed T-score left femoral neck -1.3.  Planning to travel to [place] in the near future to the winter carnival.  Will be flying.    Review of Systems no new complaints other than ear discomfort     Objective:   Physical Exam Blood pressure 132/80, pulse 85, temperature 98.1 degrees pulse oximetry 98% weight 223 pounds BMI 38.28  Skin: Warm and dry.  No cervical adenopathy.  Left TM is slightly full but not red.  Right TM is normal.  Pharynx is clear.  Neck supple.  Chest clear.       Assessment & Plan:   Recurrent acute left serous otitis media  Plan: With upcoming travel I think it is best to go ahead and treat her with Zithromax Z-PAK 2 tabs day  1 followed by 1 tab days 2 through 5.  Rest and stay well-hydrated.  Call if not improving in a few days.

## 2023-01-14 NOTE — Telephone Encounter (Signed)
[name] [phone]  [name] called to say her left ear feels achy and she has pains at times. I scheduled her at 2:30 today.

## 2023-01-14 NOTE — Patient Instructions (Signed)
Take Zithromax Z-PAK 2 tabs day 1 followed by 1 tab days 2 through 5.  Rest and stay well-hydrated.  Call back if not improving in a few days.  It was a pleasure to see you today.

## 2023-01-21 ENCOUNTER — Ambulatory Visit: Payer: Medicare PPO | Admitting: Internal Medicine

## 2023-01-21 VITALS — BP 130/80 | HR 90 | Temp 98.3°F | Ht 64.0 in | Wt 227.1 lb

## 2023-01-21 DIAGNOSIS — H6502 Acute serous otitis media, left ear: Secondary | ICD-10-CM

## 2023-01-21 MED ORDER — AZITHROMYCIN 250 MG PO TABS: ORAL_TABLET | ORAL | 1 refills | Status: DC

## 2023-01-21 MED ORDER — METHYLPREDNISOLONE ACETATE 80 MG/ML IJ SUSP
80.0000 mg | Freq: Once | INTRAMUSCULAR | Status: AC
  Administered 2023-01-21: 80 mg via INTRAMUSCULAR

## 2023-01-21 NOTE — Telephone Encounter (Signed)
After talking with Dr Renold Genta I scheduled her for this afternoon.

## 2023-01-21 NOTE — Progress Notes (Signed)
Subjective:    Patient ID: [name] , female    DOB: 08-31-1951, 72 y.o.    MRN: [id number]   72 y.o. female presents today for ear ache.    She states that she continues to have left ear "stuffiness" and intermittent shooting pain below her left ear. She reports beige nasal discharge when she is able to blow her nose. Patient reports accompanying sinus pressure. She is currently using a z-pak with 5 days remaining but has had no relief yet.     Family History  Problem Relation Age of Onset   COPD Mother    Hypertension Mother    Thyroid disease Mother    Heart disease Father    Hyperlipidemia Father    Diabetes Father    Heart disease Sister    Cancer Maternal Grandfather    Diabetes Paternal Grandmother    Breast cancer Neg Hx     Past Medical History:  Diagnosis Date   Allergic rhinitis    Carbuncle, trunk    Carpal tunnel syndrome    Chronic kidney disease    Combined hyperlipidemia associated with type 2 diabetes mellitus (HCC)    Complication of anesthesia    Cyclical vomiting    Dependent edema    Diabetes mellitus type II    Diabetic autonomic neuropathy ([place])    Diabetic peripheral neuropathy associated with type 2 diabetes mellitus (HCC)    Dyspepsia    Essential hypertension, benign    GERD (gastroesophageal reflux disease)    Goiter    Hypercholesteremia    Menopause    Obesity    OSA (obstructive sleep apnea) 09/23/2016   Osteopenia    PONV (postoperative nausea and vomiting)    Tachycardia    Type II or unspecified type diabetes mellitus without mention of complication, not stated as uncontrolled      Social History   Social History Narrative   Social history: She is married.  1 adult daughter who is a Lawyer in [place].  She does not smoke.  Occasional wine consumption.  Formerly worked for [employer] but has retired.       Family history: Father with history of aortic valve replacement,  hyperlipidemia, rheumatic heart disease.  Mother with history of hypertension, hypothyroidism, hyperlipidemia, COPD.  1 sister died in 04/06/2004 with coronary artery disease and hyperlipidemia.  1 sister living with hyperlipidemia.    Patient Care Team: Elby Showers, MD as PCP - General (Internal Medicine)   Review of Systems  Constitutional:  Negative for fever and malaise/fatigue.  HENT:  Positive for congestion, ear pain and sinus pain.   Eyes:  Negative for blurred vision.  Respiratory:  Negative for cough and shortness of breath.   Cardiovascular:  Negative for chest pain, palpitations and leg swelling.  Gastrointestinal:  Negative for vomiting.  Musculoskeletal:  Negative for back pain.  Skin:  Negative for rash.  Neurological:  Negative for loss of consciousness and headaches.        Objective:   Vitals: BP 130/80   Pulse 90   Temp 98.3 F (36.8 C) (Tympanic)   Ht '5\' 4"'$  (1.626 m)   Wt 227 lb 1.9 oz (103 kg)   SpO2 98%   BMI 38.99 kg/m    Physical Exam Vitals and nursing note reviewed.  Constitutional:      General: She is not in acute distress.    Appearance: Normal appearance. She is not ill-appearing or toxic-appearing.  HENT:     Head: Normocephalic and atraumatic.     Ears:     Comments: Dull, chronically scarred left TM Good light reflex, clear right TM     Mouth/Throat:     Comments: Pharynx slightly injected without exudate Cardiovascular:     Rate and Rhythm: Normal rate and regular rhythm. No extrasystoles are present.    Pulses: Normal pulses.     Heart sounds: Normal heart sounds. No murmur heard. Pulmonary:     Effort: Pulmonary effort is normal.     Breath sounds: Normal breath sounds. No decreased breath sounds, wheezing, rhonchi or rales.  Musculoskeletal:     Cervical back: Normal range of motion.     Right lower leg: No edema.     Left lower leg: No edema.  Skin:    General: Skin is warm.  Neurological:     General: No focal deficit  present.     Mental Status: She is alert and oriented to person, place, and time. Mental status is at baseline.  Psychiatric:        Mood and Affect: Mood normal.        Behavior: Behavior normal.        Thought Content: Thought content normal.        Judgment: Judgment normal.          Assessment & Plan:   Recurrent acute left serous otitis media: I will provide her with a refill for her z-pak. Patient was given depo Medrol and Rocephin IM today. ***   I,Alexis Herring,acting as a scribe for Elby Showers, MD.,have documented all relevant documentation on the behalf of Elby Showers, MD,as directed by  Elby Showers, MD while in the presence of Elby Showers, MD.   ***

## 2023-01-21 NOTE — Telephone Encounter (Signed)
[name] called today to say her ear is still not right and she has finished her Zpack, plus now she has drainage in her throat and a stuffy nose

## 2023-01-22 ENCOUNTER — Encounter: Payer: Self-pay | Admitting: Internal Medicine

## 2023-01-22 NOTE — Patient Instructions (Signed)
Patient received 1 g IM Rocephin today and 80 mg IM Depo-Medrol today here in the office.  I have refilled Zithromax Z-PAK to take 2 tabs day 1 followed by 1 tab days 2 through 5.  I would like for her to wait a few days to see if she responds to Rocephin and Depo-Medrol before repeating Z-Pak

## 2023-02-04 DIAGNOSIS — M1A09X0 Idiopathic chronic gout, multiple sites, without tophus (tophi): Secondary | ICD-10-CM | POA: Diagnosis not present

## 2023-02-04 DIAGNOSIS — M25561 Pain in right knee: Secondary | ICD-10-CM | POA: Diagnosis not present

## 2023-02-04 DIAGNOSIS — R21 Rash and other nonspecific skin eruption: Secondary | ICD-10-CM | POA: Diagnosis not present

## 2023-02-04 DIAGNOSIS — M1991 Primary osteoarthritis, unspecified site: Secondary | ICD-10-CM | POA: Diagnosis not present

## 2023-02-04 DIAGNOSIS — Z6838 Body mass index (BMI) 38.0-38.9, adult: Secondary | ICD-10-CM | POA: Diagnosis not present

## 2023-02-04 DIAGNOSIS — N184 Chronic kidney disease, stage 4 (severe): Secondary | ICD-10-CM | POA: Diagnosis not present

## 2023-02-04 DIAGNOSIS — M65322 Trigger finger, left index finger: Secondary | ICD-10-CM | POA: Diagnosis not present

## 2023-02-04 DIAGNOSIS — E669 Obesity, unspecified: Secondary | ICD-10-CM | POA: Diagnosis not present

## 2023-02-05 DIAGNOSIS — R0609 Other forms of dyspnea: Secondary | ICD-10-CM | POA: Diagnosis not present

## 2023-02-05 DIAGNOSIS — Z23 Encounter for immunization: Secondary | ICD-10-CM | POA: Diagnosis not present

## 2023-02-05 DIAGNOSIS — E781 Pure hyperglyceridemia: Secondary | ICD-10-CM | POA: Diagnosis not present

## 2023-02-05 DIAGNOSIS — E782 Mixed hyperlipidemia: Secondary | ICD-10-CM | POA: Diagnosis not present

## 2023-02-05 DIAGNOSIS — I10 Essential (primary) hypertension: Secondary | ICD-10-CM | POA: Diagnosis not present

## 2023-02-20 ENCOUNTER — Ambulatory Visit
Admission: RE | Admit: 2023-02-20 | Discharge: 2023-02-20 | Disposition: A | Discharge status: Home / Self Care | Payer: Medicare PPO | Source: Ambulatory Visit | Attending: Internal Medicine | Admitting: Internal Medicine

## 2023-02-20 DIAGNOSIS — Z1231 Encounter for screening mammogram for malignant neoplasm of breast: Secondary | ICD-10-CM

## 2023-03-17 NOTE — Progress Notes (Signed)
Annual Wellness Visit    Patient Care Team: Elby Showers, MD as PCP - General (Internal Medicine)  Visit Date: 03/24/23   Chief Complaint  Patient presents with   Medicare Wellness    Subjective:   Patient: [name], Female    DOB: [birth year], 72 y.o.   MRN: [id number]  [name] is a 72 y.o. Female who presents today for her Annual Wellness Visit. She has a history of allergic rhinitis, carbuncle trunk, carpal tunnel syndrome, chronic kidney disease, Type 2 diabetes, hyperlipidemia, cyclical vomiting, dependent edema, diabetic autonomic neuropathy, diabetic peripheral neuropathy, dyspepsia, essential hypertension, GERD, goiter, obesity, obstructive sleep apnea, osteopenia, tachycardia.  History of Type 2 diabetes mellitus treated with Humalog. HGBA1c at 7.3% on 03/21/23, down from 8.2% on 03/15/22.  History of hyperlipidemia treated with Lipitor 40 mg daily. HDL low at 47 on 03/21/23, up from 29 on 03/15/22. TRIG elevated at 159 on 03/21/23, down from 709 on 03/15/22.  History of essential hypertension treated with Coreg 25 mg twice daily with a meal, Zetia 10 mg. Blood pressure normal today at 128/66.  History of dependent edema treated with Lasix 20 mg.   History of gout treated with Uloric 40 mg.  History of chronic kidney disease. Followed by [facility]. Started intermittent fasting diet recently. Creatinine elevated at 2.37 on 03/21/23, down from 2.5 on 03/15/22.  Thyroid function normal. TSH at 1.76 on 03/21/23. CBC normal 03/21/23.  Pap smear last completed 03/08/19. Negative for intraepithelial lesions or malignancy. Pelvic deferred.  Mammogram last completed 02/24/2023. No mammographic evidence of malignancy. Recommended repeat in 2025.  Colonoscopy last completed 07/09/18. Two small sessile polyps in proximal descending colon, removed. Examination otherwise normal. Repeat scheduled for July.  DEXA scan last completed 09/03/22. T-score of -1.3  at Femur Neck Left. This patient is considered osteopenic/low bone mass. Recommended repeat in 2024.   Past Medical History:  Diagnosis Date   Allergic rhinitis    Carbuncle, trunk    Carpal tunnel syndrome    Chronic kidney disease    Combined hyperlipidemia associated with type 2 diabetes mellitus (HCC)    Complication of anesthesia    Cyclical vomiting    Dependent edema    Diabetes mellitus type II    Diabetic autonomic neuropathy (HCC)    Diabetic peripheral neuropathy associated with type 2 diabetes mellitus (HCC)    Dyspepsia    Essential hypertension, benign    GERD (gastroesophageal reflux disease)    Goiter    Hypercholesteremia    Menopause    Obesity    OSA (obstructive sleep apnea) 09/23/2016   Osteopenia    PONV (postoperative nausea and vomiting)    Tachycardia    Type II or unspecified type diabetes mellitus without mention of complication, not stated as uncontrolled      Family History  Problem Relation Age of Onset   COPD Mother    Hypertension Mother    Thyroid disease Mother    Heart disease Father    Hyperlipidemia Father    Diabetes Father    Heart disease Sister    Cancer Maternal Grandfather    Diabetes Paternal Grandmother    Breast cancer Neg Hx      Social History   Social History Narrative   Social history: She is married.  1 adult daughter who is a Lawyer in [place].  She does not smoke.  Occasional wine consumption.  Formerly worked for [employer] but  has retired.       Family history: Father with history of aortic valve replacement, hyperlipidemia, rheumatic heart disease.  Mother with history of hypertension, hypothyroidism, hyperlipidemia, COPD.  1 sister died in 2004-04-11 with coronary artery disease and hyperlipidemia.  1 sister living with hyperlipidemia.     Review of Systems  Constitutional:  Negative for chills, fever, malaise/fatigue and weight loss.  HENT:  Negative for hearing loss, sinus  pain and sore throat.   Respiratory:  Negative for cough, hemoptysis and shortness of breath.   Cardiovascular:  Negative for chest pain, palpitations, leg swelling and PND.  Gastrointestinal:  Negative for abdominal pain, constipation, diarrhea, heartburn, nausea and vomiting.  Genitourinary:  Negative for dysuria, frequency and urgency.  Musculoskeletal:  Negative for back pain, myalgias and neck pain.  Skin:  Negative for itching and rash.  Neurological:  Negative for dizziness, tingling, seizures and headaches.  Endo/Heme/Allergies:  Negative for polydipsia.  Psychiatric/Behavioral:  Negative for depression. The patient is not nervous/anxious.       Objective:   Vitals: BP 128/66   Pulse 81   Temp 98.5 F (36.9 C) (Tympanic)   Ht 5\' 4"  (1.626 m)   Wt 234 lb 6.4 oz (106.3 kg)   SpO2 98%   BMI 40.23 kg/m   Physical Exam Vitals and nursing note reviewed.  Constitutional:      General: She is not in acute distress.    Appearance: Normal appearance. She is not ill-appearing or toxic-appearing.  HENT:     Head: Normocephalic and atraumatic.     Right Ear: Hearing, tympanic membrane, ear canal and external ear normal.     Left Ear: Hearing, tympanic membrane, ear canal and external ear normal.     Mouth/Throat:     Pharynx: Oropharynx is clear.  Eyes:     Extraocular Movements: Extraocular movements intact.     Pupils: Pupils are equal, round, and reactive to light.  Neck:     Thyroid: No thyroid mass, thyromegaly or thyroid tenderness.     Vascular: No carotid bruit.  Cardiovascular:     Rate and Rhythm: Normal rate and regular rhythm. No extrasystoles are present.    Pulses:          Dorsalis pedis pulses are 1+ on the right side and 1+ on the left side.     Heart sounds: Murmur heard.     Systolic murmur is present with a grade of 1/6.     No friction rub. No gallop.  Pulmonary:     Effort: Pulmonary effort is normal.     Breath sounds: Normal breath sounds. No  decreased breath sounds, wheezing, rhonchi or rales.  Chest:     Chest wall: No mass.  Abdominal:     Palpations: Abdomen is soft. There is no hepatomegaly, splenomegaly or mass.     Tenderness: There is no abdominal tenderness.     Hernia: No hernia is present.  Musculoskeletal:     Cervical back: Normal range of motion.     Right lower leg: No edema.     Left lower leg: No edema.  Lymphadenopathy:     Cervical: No cervical adenopathy.     Upper Body:     Right upper body: No supraclavicular adenopathy.     Left upper body: No supraclavicular adenopathy.  Skin:    General: Skin is warm and dry.  Neurological:     General: No focal deficit present.     Mental Status:  She is alert and oriented to person, place, and time. Mental status is at baseline.     Sensory: Sensation is intact.     Motor: Motor function is intact. No weakness.     Deep Tendon Reflexes: Reflexes are normal and symmetric.  Psychiatric:        Attention and Perception: Attention normal.        Mood and Affect: Mood normal.        Speech: Speech normal.        Behavior: Behavior normal.        Thought Content: Thought content normal.        Cognition and Memory: Cognition normal.        Judgment: Judgment normal.      Most recent functional status assessment:    03/24/2023    3:00 PM  In your present state of health, do you have any difficulty performing the following activities:  Hearing? 0  Vision? 0  Difficulty concentrating or making decisions? 0  Walking or climbing stairs? 0  Dressing or bathing? 0  Doing errands, shopping? 0  Preparing Food and eating ? N  Using the Toilet? N  In the past six months, have you accidently leaked urine? N  Do you have problems with loss of bowel control? N  Managing your Medications? N  Managing your Finances? N  Housekeeping or managing your Housekeeping? N   Most recent fall risk assessment:    03/24/2023    3:00 PM  [place] in the past year?  0  Number falls in past yr: 0  Injury with Fall? 0  Risk for fall due to : No Fall Risks  Follow up Falls prevention discussed    Most recent depression screenings:    03/24/2023    3:00 PM 01/21/2023    3:59 PM  PHQ 2/9 Scores  PHQ - 2 Score 0 0   Most recent cognitive screening:    03/24/2023    3:02 PM  6CIT Screen  What Year? 0 points  What month? 0 points  What time? 0 points  Count back from 20 0 points  Months in reverse 0 points  Repeat phrase 0 points  Total Score 0 points     Results:   Studies obtained and personally reviewed by me:  Pap smear last completed 03/08/19. Negative for intraepithelial lesions or malignancy. Pelvic deferred.  Mammogram last completed 02/24/2023. No mammographic evidence of malignancy. Recommended repeat in 2025.  Colonoscopy last completed 07/09/18. Two small sessile polyps in proximal descending colon, removed. Examination otherwise normal. Repeat scheduled for July.  DEXA scan last completed 09/03/22. T-score of -1.3 at Femur Neck Left. This patient is considered osteopenic/low bone mass. Recommended repeat in 2024.   Labs:       Component Value Date/Time   NA 145 03/21/2023 0929   K 5.0 03/21/2023 0929   CL 110 03/21/2023 0929   CO2 24 03/21/2023 0929   GLUCOSE 66 03/21/2023 0929   BUN 53 (H) 03/21/2023 0929   CREATININE 2.37 (H) 03/21/2023 0929   CALCIUM 9.3 03/21/2023 0929   CALCIUM 8.3 (L) 07/17/2008 1225   PROT 6.3 03/21/2023 0929   ALBUMIN 3.8 02/23/2015 1004   AST 16 03/21/2023 0929   ALT 16 03/21/2023 0929   ALKPHOS 82 02/23/2015 1004   BILITOT 0.5 03/21/2023 0929   GFRNONAA 23 (L) 10/06/2021 0048   GFRNONAA 23 (L) 03/09/2021 1123   GFRAA 26 (L) 03/09/2021 1123  Lab Results  Component Value Date   WBC 6.7 03/21/2023   HGB 12.9 03/21/2023   HCT 39.3 03/21/2023   MCV 94.5 03/21/2023   PLT 267 03/21/2023    Lab Results  Component Value Date   CHOL 157 03/21/2023   HDL 47 (L) 03/21/2023   LDLCALC 84  03/21/2023   TRIG 159 (H) 03/21/2023   CHOLHDL 3.3 03/21/2023    Lab Results  Component Value Date   HGBA1C 7.3 (H) 03/21/2023     Lab Results  Component Value Date   TSH 1.76 03/21/2023    Assessment & Plan:   Type 2 diabetes mellitus: treated with Humalog. HGBA1c at 7.3% on 03/21/23, down from 8.2% on 03/15/22.  Hyperlipidemia: treated with Lipitor 40 mg daily. HDL low at 47 on 03/21/23, up from 29 on 03/15/22. TRIG elevated at 159 on 03/21/23, down from 709 on 03/15/22.  Essential hypertension: treated with Coreg 25 mg twice daily with a meal, Zetia 10 mg. Blood pressure normal today at 128/66.  Dependent edema: treated with Lasix 20 mg.   Gout: treated with Uloric 40 mg.  Chronic kidney disease: followed by [facility]. Started intermittent fasting diet recently. Creatinine elevated at 2.37 on 03/21/23, down from 2.5 on 03/15/22. Microalbumin/creatinine collected.  Pap smear: last completed 03/08/19. Negative for intraepithelial lesions or malignancy. Pelvic deferred.  Mammogram: last completed 02/24/2023. No mammographic evidence of malignancy. Recommended repeat in 2025.  Colonoscopy: last completed 07/09/18. Two small sessile polyps in proximal descending colon, removed. Examination otherwise normal. Repeat scheduled for July.  DEXA scan: last completed 09/03/22. T-score of -1.3 at Femur Neck Left. This patient is considered osteopenic/low bone mass. Recommended repeat in 2024.  Prescribed Z-Pak for travel in May.  Return in 1 year for health maintenance exam.     Annual wellness visit done today including the all of the following: Reviewed patient's Family Medical History Reviewed and updated list of patient's medical providers Assessment of cognitive impairment was done Assessed patient's functional ability Established a written schedule for health screening [place] Completed and Reviewed  Discussed health benefits of physical activity, and  encouraged her to engage in regular exercise appropriate for her age and condition.        I,Alexander Ruley,acting as a Education administrator for Elby Showers, MD.,have documented all relevant documentation on the behalf of Elby Showers, MD,as directed by  Elby Showers, MD while in the presence of Elby Showers, MD.   I, Elby Showers, MD, have reviewed all documentation for this visit. The documentation on 03/27/23 for the exam, diagnosis, procedures, and orders are all accurate and complete.

## 2023-03-21 ENCOUNTER — Other Ambulatory Visit: Payer: Medicare PPO

## 2023-03-21 DIAGNOSIS — R5383 Other fatigue: Secondary | ICD-10-CM | POA: Diagnosis not present

## 2023-03-21 DIAGNOSIS — E119 Type 2 diabetes mellitus without complications: Secondary | ICD-10-CM

## 2023-03-21 DIAGNOSIS — I10 Essential (primary) hypertension: Secondary | ICD-10-CM | POA: Diagnosis not present

## 2023-03-21 DIAGNOSIS — E785 Hyperlipidemia, unspecified: Secondary | ICD-10-CM | POA: Diagnosis not present

## 2023-03-21 DIAGNOSIS — E1169 Type 2 diabetes mellitus with other specified complication: Secondary | ICD-10-CM | POA: Diagnosis not present

## 2023-03-22 LAB — CBC WITH DIFFERENTIAL/PLATELET
Absolute Monocytes: 529 cells/uL (ref 200–950)
Basophils Absolute: 40 cells/uL (ref 0–200)
Basophils Relative: 0.6 %
Eosinophils Absolute: 261 cells/uL (ref 15–500)
Eosinophils Relative: 3.9 %
HCT: 39.3 % (ref 35.0–45.0)
Hemoglobin: 12.9 g/dL (ref 11.7–15.5)
Lymphs Abs: 1461 cells/uL (ref 850–3900)
MCH: 31 pg (ref 27.0–33.0)
MCHC: 32.8 g/dL (ref 32.0–36.0)
MCV: 94.5 fL (ref 80.0–100.0)
MPV: 9 fL (ref 7.5–12.5)
Monocytes Relative: 7.9 %
Neutro Abs: 4409 cells/uL (ref 1500–7800)
Neutrophils Relative %: 65.8 %
Platelets: 267 10*3/uL (ref 140–400)
RBC: 4.16 10*6/uL (ref 3.80–5.10)
RDW: 14 % (ref 11.0–15.0)
Total Lymphocyte: 21.8 %
WBC: 6.7 10*3/uL (ref 3.8–10.8)

## 2023-03-22 LAB — COMPLETE METABOLIC PANEL WITH GFR
AG Ratio: 1.9 (calc) (ref 1.0–2.5)
ALT: 16 U/L (ref 6–29)
AST: 16 U/L (ref 10–35)
Albumin: 4.1 g/dL (ref 3.6–5.1)
Alkaline phosphatase (APISO): 107 U/L (ref 37–153)
BUN/Creatinine Ratio: 22 (calc) (ref 6–22)
BUN: 53 mg/dL — ABNORMAL HIGH (ref 7–25)
CO2: 24 mmol/L (ref 20–32)
Calcium: 9.3 mg/dL (ref 8.6–10.4)
Chloride: 110 mmol/L (ref 98–110)
Creat: 2.37 mg/dL — ABNORMAL HIGH (ref 0.50–1.05)
Globulin: 2.2 g/dL (calc) (ref 1.9–3.7)
Glucose, Bld: 66 mg/dL (ref 65–99)
Potassium: 5 mmol/L (ref 3.5–5.3)
Sodium: 145 mmol/L (ref 135–146)
Total Bilirubin: 0.5 mg/dL (ref 0.2–1.2)
Total Protein: 6.3 g/dL (ref 6.1–8.1)
eGFR: 22 mL/min/{1.73_m2} — ABNORMAL LOW (ref >=60)

## 2023-03-22 LAB — LIPID PANEL
Cholesterol: 157 mg/dL (ref <200)
HDL: 47 mg/dL — ABNORMAL LOW (ref >=50)
LDL Cholesterol (Calc): 84 mg/dL (calc)
Non-HDL Cholesterol (Calc): 110 mg/dL (calc) (ref <130)
Total CHOL/HDL Ratio: 3.3 (calc) (ref <5.0)
Triglycerides: 159 mg/dL — ABNORMAL HIGH (ref <150)

## 2023-03-22 LAB — HEMOGLOBIN A1C
Hgb A1c MFr Bld: 7.3 % of total Hgb — ABNORMAL HIGH (ref <5.7)
Mean Plasma Glucose: 163 mg/dL
eAG (mmol/L): 9 mmol/L

## 2023-03-22 LAB — TSH: TSH: 1.76 mIU/L (ref 0.40–4.50)

## 2023-03-24 ENCOUNTER — Encounter: Payer: Self-pay | Admitting: Internal Medicine

## 2023-03-24 ENCOUNTER — Encounter: Payer: Medicare PPO | Admitting: Internal Medicine

## 2023-03-24 ENCOUNTER — Ambulatory Visit (INDEPENDENT_AMBULATORY_CARE_PROVIDER_SITE_OTHER): Payer: Medicare PPO | Admitting: Internal Medicine

## 2023-03-24 VITALS — BP 128/66 | HR 81 | Temp 98.5°F | Ht 64.0 in | Wt 234.4 lb

## 2023-03-24 DIAGNOSIS — E1169 Type 2 diabetes mellitus with other specified complication: Secondary | ICD-10-CM | POA: Diagnosis not present

## 2023-03-24 DIAGNOSIS — Z8739 Personal history of other diseases of the musculoskeletal system and connective tissue: Secondary | ICD-10-CM | POA: Diagnosis not present

## 2023-03-24 DIAGNOSIS — E785 Hyperlipidemia, unspecified: Secondary | ICD-10-CM | POA: Diagnosis not present

## 2023-03-24 DIAGNOSIS — E119 Type 2 diabetes mellitus without complications: Secondary | ICD-10-CM | POA: Diagnosis not present

## 2023-03-24 DIAGNOSIS — Z0000 Encounter for general adult medical examination without abnormal findings: Secondary | ICD-10-CM | POA: Diagnosis not present

## 2023-03-24 DIAGNOSIS — N184 Chronic kidney disease, stage 4 (severe): Secondary | ICD-10-CM | POA: Diagnosis not present

## 2023-03-24 DIAGNOSIS — R82998 Other abnormal findings in urine: Secondary | ICD-10-CM

## 2023-03-24 DIAGNOSIS — I10 Essential (primary) hypertension: Secondary | ICD-10-CM

## 2023-03-24 DIAGNOSIS — Z6841 Body Mass Index (BMI) 40.0 and over, adult: Secondary | ICD-10-CM

## 2023-03-24 LAB — POCT URINALYSIS DIPSTICK
Bilirubin, UA: NEGATIVE
Glucose, UA: NEGATIVE
Ketones, UA: NEGATIVE
Nitrite, UA: NEGATIVE
Protein, UA: POSITIVE — AB
Spec Grav, UA: 1.01 (ref 1.010–1.025)
Urobilinogen, UA: 0.2 E.U./dL
pH, UA: 7.5 (ref 5.0–8.0)

## 2023-03-24 MED ORDER — AZITHROMYCIN 250 MG PO TABS: ORAL_TABLET | ORAL | 0 refills | Status: AC

## 2023-03-25 LAB — URINE CULTURE
MICRO NUMBER:: 14736744
SPECIMEN QUALITY:: ADEQUATE

## 2023-03-25 LAB — MICROALBUMIN / CREATININE URINE RATIO
Creatinine, Urine: 83 mg/dL (ref 20–275)
Microalb Creat Ratio: 458 mg/g creat — ABNORMAL HIGH (ref <30)
Microalb, Ur: 38 mg/dL

## 2023-03-26 DIAGNOSIS — N184 Chronic kidney disease, stage 4 (severe): Secondary | ICD-10-CM | POA: Diagnosis not present

## 2023-03-26 DIAGNOSIS — N2589 Other disorders resulting from impaired renal tubular function: Secondary | ICD-10-CM | POA: Diagnosis not present

## 2023-03-26 DIAGNOSIS — N2581 Secondary hyperparathyroidism of renal origin: Secondary | ICD-10-CM | POA: Diagnosis not present

## 2023-03-26 DIAGNOSIS — N179 Acute kidney failure, unspecified: Secondary | ICD-10-CM | POA: Diagnosis not present

## 2023-03-26 DIAGNOSIS — E875 Hyperkalemia: Secondary | ICD-10-CM | POA: Diagnosis not present

## 2023-03-26 DIAGNOSIS — E785 Hyperlipidemia, unspecified: Secondary | ICD-10-CM | POA: Diagnosis not present

## 2023-03-26 DIAGNOSIS — R809 Proteinuria, unspecified: Secondary | ICD-10-CM | POA: Diagnosis not present

## 2023-03-26 DIAGNOSIS — E1122 Type 2 diabetes mellitus with diabetic chronic kidney disease: Secondary | ICD-10-CM | POA: Diagnosis not present

## 2023-03-26 DIAGNOSIS — I129 Hypertensive chronic kidney disease with stage 1 through stage 4 chronic kidney disease, or unspecified chronic kidney disease: Secondary | ICD-10-CM | POA: Diagnosis not present

## 2023-03-27 NOTE — Patient Instructions (Addendum)
It was a pleasure to see you today. RTC in one year or as needed. Z-pak prescribed for travel. Urine dipstick is abnormal. You do not have a UTI. Urine culture showed mixed flora- likely not a good clean catch.

## 2023-04-01 DIAGNOSIS — G4733 Obstructive sleep apnea (adult) (pediatric): Secondary | ICD-10-CM | POA: Diagnosis not present

## 2023-05-05 DIAGNOSIS — N184 Chronic kidney disease, stage 4 (severe): Secondary | ICD-10-CM | POA: Diagnosis not present

## 2023-05-19 ENCOUNTER — Encounter (HOSPITAL_BASED_OUTPATIENT_CLINIC_OR_DEPARTMENT_OTHER): Payer: Self-pay | Admitting: Pulmonary Disease

## 2023-05-19 ENCOUNTER — Ambulatory Visit (HOSPITAL_BASED_OUTPATIENT_CLINIC_OR_DEPARTMENT_OTHER): Payer: Medicare PPO | Admitting: Pulmonary Disease

## 2023-05-19 VITALS — BP 132/72 | HR 95 | Temp 98.2°F | Ht 64.0 in | Wt 221.2 lb

## 2023-05-19 DIAGNOSIS — D0461 Carcinoma in situ of skin of right upper limb, including shoulder: Secondary | ICD-10-CM | POA: Diagnosis not present

## 2023-05-19 DIAGNOSIS — Z808 Family history of malignant neoplasm of other organs or systems: Secondary | ICD-10-CM | POA: Diagnosis not present

## 2023-05-19 DIAGNOSIS — L814 Other melanin hyperpigmentation: Secondary | ICD-10-CM | POA: Diagnosis not present

## 2023-05-19 DIAGNOSIS — G4733 Obstructive sleep apnea (adult) (pediatric): Secondary | ICD-10-CM

## 2023-05-19 DIAGNOSIS — L570 Actinic keratosis: Secondary | ICD-10-CM | POA: Diagnosis not present

## 2023-05-19 DIAGNOSIS — D485 Neoplasm of uncertain behavior of skin: Secondary | ICD-10-CM | POA: Diagnosis not present

## 2023-05-19 DIAGNOSIS — L578 Other skin changes due to chronic exposure to nonionizing radiation: Secondary | ICD-10-CM | POA: Diagnosis not present

## 2023-05-19 DIAGNOSIS — D225 Melanocytic nevi of trunk: Secondary | ICD-10-CM | POA: Diagnosis not present

## 2023-05-19 DIAGNOSIS — L821 Other seborrheic keratosis: Secondary | ICD-10-CM | POA: Diagnosis not present

## 2023-05-19 DIAGNOSIS — Z85820 Personal history of malignant melanoma of skin: Secondary | ICD-10-CM | POA: Diagnosis not present

## 2023-05-19 NOTE — Patient Instructions (Signed)
Follow up in 1 year.

## 2023-05-19 NOTE — Progress Notes (Signed)
[facility]  Chief Complaint  Patient presents with   Follow-up    Follow up. Patient has no complaints.     Constitutional:  BP 132/72 (BP Location: Right Arm, Patient Position: Sitting, Cuff Size: Normal)   Pulse 95   Temp 98.2 F (36.8 C) (Oral)   Ht 5\' 4"  (1.626 m)   Wt 221 lb 3.2 oz (100.3 kg)   SpO2 94%   BMI 37.97 kg/m   Past Medical History:  Allergic rhinitis, Carpal tunnel, CKD, HLD, DM type 2, Neuropathy, HTN, GERD, Goiter, HLD, Osteopenia, CKD 4, COVID 19 in February 2021 and August 2022  Past Surgical History:  Her  has a past surgical history that includes Ganglion cyst excision (1959); Cesarean section (1983); Dilation and curettage of uterus (1988); Colonoscopy with propofol (N/A, 07/09/2018); and polypectomy (07/09/2018).  Brief Summary:  [name] is a 72 y.o. female with obstructive sleep apnea.      Subjective:   Uses CPAP nightly.  No issues with mask fit or pressure.  Uses a nasal pillow mask.  Recently started on ozempic.   She travels a lot and got a travel CPAP machine.  Physical Exam:   Appearance - well kempt   ENMT - no sinus tenderness, no oral exudate, no LAN, Mallampati 3 airway, no stridor  Respiratory - equal breath sounds bilaterally, no wheezing or rales  CV - s1s2 regular rate and rhythm, no murmurs  Ext - no clubbing, no edema  Skin - no rashes  Psych - normal mood and affect     Sleep Tests:  HST 09/12/16 >> AHI 30.1, SaO2 low 74% Auto CPAP 02/18/23 to 05/18/23 >> used on 79 of 90 nights with average 8 hrs 2 min.  Average AHI 1.8 with median CPAP 8 and 95 th percentile CPAP 12 cm H2O  Cardiac Tests:  Echo 08/17/20 >> normal EF, mild LVH  Social History:  She  reports that she has never smoked. She has never used smokeless tobacco. She reports that she does not drink alcohol and does not use drugs.  Family History:  Her family history includes COPD in her  mother; Cancer in her maternal grandfather; Diabetes in her father and paternal grandmother; Heart disease in her father and sister; Hyperlipidemia in her father; Hypertension in her mother; Thyroid disease in her mother.     Assessment/Plan:   Obstructive sleep apnea. - she is compliant with CPAP and reports benefit - she uses [employer] for her DME - current CPAP ordered November 2022 - continue auto CPAP 5 to 15 cm H2O - she has a travel CPAP machine also - reviewed Inspire device; her BMI is above cutoff to be a candidate for this  Obesity. - recently started on ozempic  Time Spent Involved in Patient Care on Day of Examination:  16 minutes  Follow up:   Patient Instructions  Follow up in 1 year  Medication List:   Allergies as of 05/19/2023       Reactions   Metformin    Other reaction(s): Other (See Comments), Unknown "Facial puffiness" Pt stated her kidney specialist told her to add metformin to her drug allergies list.   Metformin And Related Other (See Comments)   KIDNEY FAILURE?   Fenofibrate    fever   Ciprocin-fluocin-procin [fluocinolone]    Acute kidney failure   Ciprofloxacin Other (See Comments)   Kidney failure Other reaction(s): Unknown Other reaction(s): Unknown   Fenofibrate  Other reaction(s): fever   Insulin Degludec Other (See Comments)   Insulins Other (See Comments)   Migraines, nausea Other reaction(s): Other (See Comments), Unknown Migraines, nausea NOVLOG   Niacin    Other reaction(s): Unknown   Orange Fruit [citrus]    Ramipril Cough   Ramipril    Other reaction(s): cough   Sulfamethoxazole-trimethoprim    Other reaction(s): Unknown   Sulfa Antibiotics Rash        Medication List        Accurate as of May 19, 2023  2:54 PM. If you have any questions, ask your nurse or doctor.          Accu-Chek Softclix Lancets lancets See admin instructions.   aspirin 81 MG chewable tablet Chew 81 mg by mouth daily.    atorvastatin 40 MG tablet Commonly known as: LIPITOR Take 1 tablet by mouth daily.   B-D UF III MINI PEN NEEDLES 31G X 5 MM Misc Generic drug: Insulin Pen Needle   carvedilol 25 MG tablet Commonly known as: COREG Take 1 tablet (25 mg total) by mouth 2 (two) times daily with a meal.   cholecalciferol 1000 units tablet Commonly known as: VITAMIN D Take 1,000 Units by mouth daily.   ezetimibe 10 MG tablet Commonly known as: ZETIA 1 tablet   febuxostat 40 MG tablet Commonly known as: ULORIC 1 tablet   Fiasp FlexTouch 100 UNIT/ML FlexTouch Pen Generic drug: insulin aspart SMARTSIG:3-5 Unit(s) SUB-Q 3 Times Daily   Fluad Quadrivalent 0.5 ML injection Generic drug: influenza vaccine adjuvanted Inject into the muscle.   furosemide 20 MG tablet Commonly known as: LASIX as needed.   glucose blood test strip Commonly known as: OneTouch Verio Check blood sugar 3 x daily   insulin lispro 100 UNIT/ML KwikPen Commonly known as: HUMALOG   insulin NPH Human 100 UNIT/ML injection Commonly known as: NOVOLIN N 24 in am and 49pm   ketoconazole 2 % cream Commonly known as: NIZORAL APPLY TOPICALLY ONCE DAILY   loratadine 10 MG tablet Commonly known as: CLARITIN Take 10 mg by mouth as needed.   metroNIDAZOLE 0.75 % cream Commonly known as: METROCREAM APPLY TO AFFECTED AREAS TWICE DAILY for 23   multivitamin capsule Take 1 capsule by mouth daily.   nitroGLYCERIN 0.4 MG SL tablet Commonly known as: NITROSTAT Place under the tongue.   omega-3 acid ethyl esters 1 g capsule Commonly known as: LOVAZA Take 2 capsules by mouth 2 (two) times daily.   Ozempic (0.25 or 0.5 MG/DOSE) 2 MG/3ML Sopn Generic drug: Semaglutide(0.25 or 0.5MG /DOS) Inject 0.5 mg into the skin once a week.   promethazine 25 MG tablet Commonly known as: PHENERGAN Take 25 mg by mouth daily as needed for nausea.   sodium bicarbonate 650 MG tablet 1,300 mg 2 (two) times daily.   Vitamin C 500 MG  Caps Take 500 mg by mouth daily.        Signature:  Coralyn Helling, MD [facility]monary/Critical Care Pager - [phone] 05/19/2023, 2:54 PM

## 2023-05-20 DIAGNOSIS — E669 Obesity, unspecified: Secondary | ICD-10-CM | POA: Diagnosis not present

## 2023-05-20 DIAGNOSIS — Z794 Long term (current) use of insulin: Secondary | ICD-10-CM | POA: Diagnosis not present

## 2023-05-20 DIAGNOSIS — Z79899 Other long term (current) drug therapy: Secondary | ICD-10-CM | POA: Diagnosis not present

## 2023-05-20 DIAGNOSIS — E1122 Type 2 diabetes mellitus with diabetic chronic kidney disease: Secondary | ICD-10-CM | POA: Diagnosis not present

## 2023-06-09 DIAGNOSIS — R799 Abnormal finding of blood chemistry, unspecified: Secondary | ICD-10-CM | POA: Diagnosis not present

## 2023-06-09 DIAGNOSIS — N184 Chronic kidney disease, stage 4 (severe): Secondary | ICD-10-CM | POA: Diagnosis not present

## 2023-06-12 DIAGNOSIS — C44622 Squamous cell carcinoma of skin of right upper limb, including shoulder: Secondary | ICD-10-CM | POA: Diagnosis not present

## 2023-06-13 DIAGNOSIS — R809 Proteinuria, unspecified: Secondary | ICD-10-CM | POA: Diagnosis not present

## 2023-06-13 DIAGNOSIS — E1122 Type 2 diabetes mellitus with diabetic chronic kidney disease: Secondary | ICD-10-CM | POA: Diagnosis not present

## 2023-06-13 DIAGNOSIS — N179 Acute kidney failure, unspecified: Secondary | ICD-10-CM | POA: Diagnosis not present

## 2023-06-13 DIAGNOSIS — E875 Hyperkalemia: Secondary | ICD-10-CM | POA: Diagnosis not present

## 2023-06-13 DIAGNOSIS — I129 Hypertensive chronic kidney disease with stage 1 through stage 4 chronic kidney disease, or unspecified chronic kidney disease: Secondary | ICD-10-CM | POA: Diagnosis not present

## 2023-06-13 DIAGNOSIS — N2581 Secondary hyperparathyroidism of renal origin: Secondary | ICD-10-CM | POA: Diagnosis not present

## 2023-06-13 DIAGNOSIS — N2589 Other disorders resulting from impaired renal tubular function: Secondary | ICD-10-CM | POA: Diagnosis not present

## 2023-06-13 DIAGNOSIS — N184 Chronic kidney disease, stage 4 (severe): Secondary | ICD-10-CM | POA: Diagnosis not present

## 2023-06-13 DIAGNOSIS — E785 Hyperlipidemia, unspecified: Secondary | ICD-10-CM | POA: Diagnosis not present

## 2023-06-16 ENCOUNTER — Telehealth: Payer: Self-pay

## 2023-06-16 NOTE — Patient Outreach (Signed)
  Care Coordination   06/16/2023 Name: [name] MRN: [id number] DOB: [birth year]   Care Coordination Outreach Attempts:  An unsuccessful telephone outreach was attempted today to offer the patient information about available care coordination services.  Follow Up Plan:  Additional outreach attempts will be made to offer the patient care coordination information and services.   Encounter Outcome:  No Answer   Care Coordination Interventions:  No, not indicated    Bevelyn Ngo, BSW, CDP Social Worker, Certified Dementia Practitioner [facility] Care Management  Care Coordination [phone]

## 2023-06-23 ENCOUNTER — Telehealth: Payer: Self-pay

## 2023-06-23 NOTE — Patient Outreach (Signed)
  Care Coordination   06/23/2023 Name: [name] MRN: [id number] DOB: 11-14-1951   Care Coordination Outreach Attempts:  A second unsuccessful outreach was attempted today to offer the patient with information about available care coordination services.  Follow Up Plan:  Additional outreach attempts will be made to offer the patient care coordination information and services.   Encounter Outcome:  No Answer   Care Coordination Interventions:  No, not indicated    Bevelyn Ngo, BSW, CDP Social Worker, Certified Dementia Practitioner [facility] Care Management  Care Coordination [phone]

## 2023-06-24 ENCOUNTER — Ambulatory Visit: Payer: Self-pay

## 2023-06-24 NOTE — Patient Instructions (Signed)
Visit Information  Thank you for taking time to visit with me today. Please don't hesitate to contact me if I can be of assistance to you.   Following are the goals we discussed today:  -Contact your primary care provider as needed   If you are experiencing a Mental Health or Behavioral Health Crisis or need someone to talk to, please go to [facility] [address], [place] ([phone]) call [phone]  Patient verbalizes understanding of instructions and care plan provided today and agrees to view in MyChart. Active MyChart status and patient understanding of how to access instructions and care plan via MyChart confirmed with patient.     No further follow up required: Please contact the care coordination team as needed  Dewight Catino, BSW, CDP Social Worker, Certified Dementia Practitioner [facility] Care Management  Care Coordination [phone]          

## 2023-06-24 NOTE — Patient Outreach (Signed)
  Care Coordination   Initial Visit Note   06/24/2023 Name: [name] MRN: [id number] DOB: December 28, 1951  [name] is a 72 y.o. year old female who sees Baxley, Luanna Cole, MD for primary care. I spoke with  [name] by phone today.  What matters to the patients health and wellness today?  SW placed an outbound call to the patient to introduce care coordination services. The patient declined engagement at this time indicating she is doing well at home. SW encouraged the patient to contact her primary care provider as needed.    SDOH assessments and interventions completed:  Yes  SDOH Interventions Today    Flowsheet Row Most Recent Value  SDOH Interventions   Food Insecurity Interventions Intervention Not Indicated  Housing Interventions Intervention Not Indicated  Transportation Interventions Intervention Not Indicated        Care Coordination Interventions:  Yes, provided  Interventions Today    Flowsheet Row Most Recent Value  Chronic Disease   Chronic disease during today's visit Hypertension (HTN), Diabetes  General Interventions   General Interventions Discussed/Reviewed General Interventions Discussed, Doctor Visits  Doctor Visits Discussed/Reviewed Doctor Visits Reviewed  Education Interventions   Education Provided Provided Education  [role of the care coordination team]        Follow up plan: No further intervention required.   Encounter Outcome:  Pt. Visit Completed   Bevelyn Ngo, BSW, CDP Social Worker, Certified Dementia Practitioner [facility] Care Management  Care Coordination [phone]

## 2023-06-30 ENCOUNTER — Encounter (HOSPITAL_COMMUNITY): Payer: Self-pay | Admitting: Gastroenterology

## 2023-07-01 DIAGNOSIS — G4733 Obstructive sleep apnea (adult) (pediatric): Secondary | ICD-10-CM | POA: Diagnosis not present

## 2023-07-07 ENCOUNTER — Encounter: Payer: Self-pay | Admitting: Internal Medicine

## 2023-07-07 ENCOUNTER — Telehealth: Payer: Self-pay | Admitting: Internal Medicine

## 2023-07-07 ENCOUNTER — Ambulatory Visit: Payer: Medicare PPO | Admitting: Internal Medicine

## 2023-07-07 VITALS — BP 112/78 | HR 107 | Temp 98.4°F

## 2023-07-07 DIAGNOSIS — J069 Acute upper respiratory infection, unspecified: Secondary | ICD-10-CM

## 2023-07-07 MED ORDER — AZITHROMYCIN 250 MG PO TABS: ORAL_TABLET | ORAL | 0 refills | Status: AC

## 2023-07-07 NOTE — Progress Notes (Signed)
Patient Care Team: Margaree Mackintosh, MD as PCP - General (Internal Medicine)  Visit Date: 07/07/23  Subjective:    Patient ID: [name] , Female   DOB: 1951/02/16, 72 y.o.    MRN: [id number]   73 y.o. Female presents today for sore throat, cough with green sputum, post-nasal drip since 06/29/23. She is feeling better today. Reports negative at-home Covid-19 test this morning. Denies sick contact. She is scheduled for a colonoscopy on 07/11/23.  Past Medical History:  Diagnosis Date   Allergic rhinitis    Carbuncle, trunk    Carpal tunnel syndrome    Chronic kidney disease    Combined hyperlipidemia associated with type 2 diabetes mellitus (HCC)    Complication of anesthesia    Cyclical vomiting    Dependent edema    Diabetes mellitus type II    Diabetic autonomic neuropathy (HCC)    Diabetic peripheral neuropathy associated with type 2 diabetes mellitus (HCC)    Dyspepsia    Essential hypertension, benign    GERD (gastroesophageal reflux disease)    Goiter    Hypercholesteremia    Menopause    Obesity    OSA (obstructive sleep apnea) 09/23/2016   Osteopenia    PONV (postoperative nausea and vomiting)    Tachycardia    Type II or unspecified type diabetes mellitus without mention of complication, not stated as uncontrolled      Family History  Problem Relation Age of Onset   COPD Mother    Hypertension Mother    Thyroid disease Mother    Heart disease Father    Hyperlipidemia Father    Diabetes Father    Heart disease Sister    Cancer Maternal Grandfather    Diabetes Paternal Grandmother    Breast cancer Neg Hx     Social History   Social History Narrative   Social history: She is married.  1 adult daughter who is a Diplomatic Services operational officer in [place].  She does not smoke.  Occasional wine consumption.  Formerly worked for [employer] but has retired.       Family history: Father with history of aortic valve replacement,  hyperlipidemia, rheumatic heart disease.  Mother with history of hypertension, hypothyroidism, hyperlipidemia, COPD.  1 sister died in 08-12-04 with coronary artery disease and hyperlipidemia.  1 sister living with hyperlipidemia.      Review of Systems  Constitutional:  Negative for fever and malaise/fatigue.  HENT:  Positive for sore throat. Negative for congestion.        (+) Post nasal drip  Eyes:  Negative for blurred vision.  Respiratory:  Positive for cough and sputum production (Green). Negative for shortness of breath.   Cardiovascular:  Negative for chest pain, palpitations and leg swelling.  Gastrointestinal:  Negative for vomiting.  Musculoskeletal:  Negative for back pain.  Skin:  Negative for rash.  Neurological:  Negative for loss of consciousness and headaches.        Objective:   Vitals: BP 112/78   Pulse (!) 107   Temp 98.4 F (36.9 C) (Tympanic)   SpO2 97%    Physical Exam Vitals and nursing note reviewed.  Constitutional:      General: She is not in acute distress.    Appearance: Normal appearance. She is not toxic-appearing.  HENT:     Head: Normocephalic and atraumatic.     Right Ear: Hearing, tympanic membrane, ear canal and external ear normal.     Left Ear:  Hearing, tympanic membrane, ear canal and external ear normal.     Mouth/Throat:     Pharynx: Oropharynx is clear.  Pulmonary:     Effort: Pulmonary effort is normal. No respiratory distress.     Breath sounds: Normal breath sounds. No wheezing or rales.  Skin:    General: Skin is warm and dry.  Neurological:     Mental Status: She is alert and oriented to person, place, and time. Mental status is at baseline.  Psychiatric:        Mood and Affect: Mood normal.        Behavior: Behavior normal.        Thought Content: Thought content normal.        Judgment: Judgment normal.       Results:   Studies obtained and personally reviewed by me:   Labs:       Component Value Date/Time    NA 145 03/21/2023 0929   K 5.0 03/21/2023 0929   CL 110 03/21/2023 0929   CO2 24 03/21/2023 0929   GLUCOSE 66 03/21/2023 0929   BUN 53 (H) 03/21/2023 0929   CREATININE 2.37 (H) 03/21/2023 0929   CALCIUM 9.3 03/21/2023 0929   CALCIUM 8.3 (L) 07/17/2008 1225   PROT 6.3 03/21/2023 0929   ALBUMIN 3.8 02/23/2015 1004   AST 16 03/21/2023 0929   ALT 16 03/21/2023 0929   ALKPHOS 82 02/23/2015 1004   BILITOT 0.5 03/21/2023 0929   GFRNONAA 23 (L) 10/06/2021 0048   GFRNONAA 23 (L) 03/09/2021 1123   GFRAA 26 (L) 03/09/2021 1123     Lab Results  Component Value Date   WBC 6.7 03/21/2023   HGB 12.9 03/21/2023   HCT 39.3 03/21/2023   MCV 94.5 03/21/2023   PLT 267 03/21/2023    Lab Results  Component Value Date   CHOL 157 03/21/2023   HDL 47 (L) 03/21/2023   LDLCALC 84 03/21/2023   TRIG 159 (H) 03/21/2023   CHOLHDL 3.3 03/21/2023    Lab Results  Component Value Date   HGBA1C 7.3 (H) 03/21/2023     Lab Results  Component Value Date   TSH 1.76 03/21/2023      Assessment & Plan:   Acute Upper Respiratory Infection: prescribed Zithromax Z-Pak two tabs day 1 followed by one tab days 2-5. Advised  patient to contact GI office with this health update before proceeding with colonoscopy.    I,Alexander Ruley,acting as a Neurosurgeon for Margaree Mackintosh, MD.,have documented all relevant documentation on the behalf of Margaree Mackintosh, MD,as directed by  Margaree Mackintosh, MD while in the presence of Margaree Mackintosh, MD.   I, Margaree Mackintosh, MD, have reviewed all documentation for this visit. The documentation on 07/22/23 for the exam, diagnosis, procedures, and orders are all accurate and complete.

## 2023-07-07 NOTE — Telephone Encounter (Signed)
[name] [phone]  [place] called to say she has been sick with sore throat, cough, and now has green mucus since a week this past Thursday. COVID test was negative today. I have scheduled for 12:00 today

## 2023-07-11 ENCOUNTER — Ambulatory Visit (HOSPITAL_COMMUNITY)
Admission: RE | Admit: 2023-07-11 | Discharge: 2023-07-11 | Disposition: A | Discharge status: Home / Self Care | Payer: Medicare PPO | Attending: Gastroenterology | Admitting: Gastroenterology

## 2023-07-11 ENCOUNTER — Encounter (HOSPITAL_COMMUNITY): Payer: Self-pay | Admitting: Gastroenterology

## 2023-07-11 ENCOUNTER — Other Ambulatory Visit: Payer: Self-pay

## 2023-07-11 ENCOUNTER — Ambulatory Visit (HOSPITAL_BASED_OUTPATIENT_CLINIC_OR_DEPARTMENT_OTHER): Payer: Medicare PPO | Admitting: Certified Registered Nurse Anesthetist

## 2023-07-11 ENCOUNTER — Encounter (HOSPITAL_COMMUNITY)
Admission: RE | Disposition: A | Discharge status: Home / Self Care | Payer: Self-pay | Source: Home / Self Care | Attending: Gastroenterology

## 2023-07-11 ENCOUNTER — Ambulatory Visit (HOSPITAL_COMMUNITY): Payer: Medicare PPO | Admitting: Certified Registered Nurse Anesthetist

## 2023-07-11 DIAGNOSIS — I129 Hypertensive chronic kidney disease with stage 1 through stage 4 chronic kidney disease, or unspecified chronic kidney disease: Secondary | ICD-10-CM | POA: Diagnosis not present

## 2023-07-11 DIAGNOSIS — G4733 Obstructive sleep apnea (adult) (pediatric): Secondary | ICD-10-CM | POA: Insufficient documentation

## 2023-07-11 DIAGNOSIS — N189 Chronic kidney disease, unspecified: Secondary | ICD-10-CM | POA: Diagnosis not present

## 2023-07-11 DIAGNOSIS — D123 Benign neoplasm of transverse colon: Secondary | ICD-10-CM

## 2023-07-11 DIAGNOSIS — Z1211 Encounter for screening for malignant neoplasm of colon: Secondary | ICD-10-CM | POA: Insufficient documentation

## 2023-07-11 DIAGNOSIS — K5730 Diverticulosis of large intestine without perforation or abscess without bleeding: Secondary | ICD-10-CM | POA: Diagnosis not present

## 2023-07-11 DIAGNOSIS — E1122 Type 2 diabetes mellitus with diabetic chronic kidney disease: Secondary | ICD-10-CM | POA: Insufficient documentation

## 2023-07-11 DIAGNOSIS — K219 Gastro-esophageal reflux disease without esophagitis: Secondary | ICD-10-CM | POA: Insufficient documentation

## 2023-07-11 DIAGNOSIS — I10 Essential (primary) hypertension: Secondary | ICD-10-CM | POA: Diagnosis not present

## 2023-07-11 DIAGNOSIS — E782 Mixed hyperlipidemia: Secondary | ICD-10-CM | POA: Diagnosis not present

## 2023-07-11 DIAGNOSIS — E1143 Type 2 diabetes mellitus with diabetic autonomic (poly)neuropathy: Secondary | ICD-10-CM | POA: Insufficient documentation

## 2023-07-11 DIAGNOSIS — E785 Hyperlipidemia, unspecified: Secondary | ICD-10-CM | POA: Diagnosis not present

## 2023-07-11 DIAGNOSIS — E1142 Type 2 diabetes mellitus with diabetic polyneuropathy: Secondary | ICD-10-CM | POA: Insufficient documentation

## 2023-07-11 DIAGNOSIS — K635 Polyp of colon: Secondary | ICD-10-CM | POA: Insufficient documentation

## 2023-07-11 DIAGNOSIS — E1169 Type 2 diabetes mellitus with other specified complication: Secondary | ICD-10-CM | POA: Diagnosis not present

## 2023-07-11 DIAGNOSIS — N184 Chronic kidney disease, stage 4 (severe): Secondary | ICD-10-CM | POA: Diagnosis not present

## 2023-07-11 DIAGNOSIS — Z86010 Personal history of colonic polyps: Secondary | ICD-10-CM

## 2023-07-11 HISTORY — PX: COLONOSCOPY WITH PROPOFOL: SHX5780

## 2023-07-11 HISTORY — PX: POLYPECTOMY: SHX5525

## 2023-07-11 LAB — GLUCOSE, CAPILLARY: Glucose-Capillary: 109 mg/dL — ABNORMAL HIGH (ref 70–99)

## 2023-07-11 SURGERY — COLONOSCOPY WITH PROPOFOL
Anesthesia: Monitor Anesthesia Care

## 2023-07-11 MED ORDER — PROPOFOL 500 MG/50ML IV EMUL
INTRAVENOUS | Status: AC
  Filled 2023-07-11: qty 100

## 2023-07-11 MED ORDER — SODIUM CHLORIDE 0.9 % IV SOLN: INTRAVENOUS | Status: DC

## 2023-07-11 MED ORDER — PROPOFOL 1000 MG/100ML IV EMUL
INTRAVENOUS | Status: AC
  Filled 2023-07-11: qty 100

## 2023-07-11 MED ORDER — DEXMEDETOMIDINE HCL IN NACL 80 MCG/20ML IV SOLN
INTRAVENOUS | Status: AC
  Filled 2023-07-11: qty 20

## 2023-07-11 MED ORDER — LACTATED RINGERS IV SOLN
INTRAVENOUS | Status: AC | PRN
  Administered 2023-07-11: 1000 mL via INTRAVENOUS

## 2023-07-11 MED ORDER — PROPOFOL 500 MG/50ML IV EMUL
INTRAVENOUS | Status: DC | PRN
  Administered 2023-07-11: 150 ug/kg/min via INTRAVENOUS

## 2023-07-11 MED ORDER — PHENYLEPHRINE HCL (PRESSORS) 10 MG/ML IV SOLN
INTRAVENOUS | Status: DC | PRN
  Administered 2023-07-11 (×2): 80 ug via INTRAVENOUS

## 2023-07-11 SURGICAL SUPPLY — 22 items

## 2023-07-11 NOTE — Transfer of Care (Signed)
Immediate Anesthesia Transfer of Care Note  Patient: Aleksia Dravis Scoville  Procedure(s) Performed: Procedure(s): COLONOSCOPY WITH PROPOFOL (N/A) POLYPECTOMY  Patient Location: PACU and Endoscopy Unit  Anesthesia Type:MAC  Level of Consciousness: awake, alert  and oriented  Airway & Oxygen Therapy: Patient Spontanous Breathing and Patient connected to nasal cannula oxygen  Post-op Assessment: Report given to RN and Post -op Vital signs reviewed and stable  Post vital signs: Reviewed and stable  Last Vitals:  Vitals:   07/11/23 0637 07/11/23 0807  BP:  (!) 76/50  Pulse: 98 87  Resp: 20 16  Temp: (!) 36.4 C (!) 36.3 C  SpO2: 99% 100%    Complications: No apparent anesthesia complications

## 2023-07-11 NOTE — Op Note (Signed)
[facility] Patient Name: [name] Procedure Date: 07/11/2023 MRN: [id number] Attending MD: Jeani Hawking , MD, [id number] Date of Birth: Oct 16, 1951 CSN: [id number] Age: 72 Admit Type: Outpatient Procedure:                Colonoscopy Indications:              High risk colon cancer surveillance: Personal                            history of colonic polyps Providers:                Jeani Hawking, MD, Martha Clan, RN, Kandice Robinsons, Technician Referring MD:              Medicines:                Propofol per Anesthesia Complications:            No immediate complications. Estimated Blood Loss:     Estimated blood loss: none. Procedure:                Pre-Anesthesia Assessment:                           - Prior to the procedure, a History and Physical                            was performed, and patient medications and                            allergies were reviewed. The patient's tolerance of                            previous anesthesia was also reviewed. The risks                            and benefits of the procedure and the sedation                            options and risks were discussed with the patient.                            All questions were answered, and informed consent                            was obtained. Prior Anticoagulants: The patient has                            taken no anticoagulant or antiplatelet agents. ASA                            Grade Assessment: III - A patient with severe                            systemic  disease. After reviewing the risks and                            benefits, the patient was deemed in satisfactory                            condition to undergo the procedure.                           - Sedation was administered by an anesthesia                            professional. Deep sedation was attained.                           After obtaining informed consent, the  colonoscope                            was passed under direct vision. Throughout the                            procedure, the patient's blood pressure, pulse, and                            oxygen saturations were monitored continuously. The                            CF-HQ190L ([id number]) Olympus colonoscope was                            introduced through the anus and advanced to the the                            cecum, identified by appendiceal orifice and                            ileocecal valve. The colonoscopy was performed                            without difficulty. The patient tolerated the                            procedure well. The quality of the bowel                            preparation was evaluated using the BBPS [facility]                            Bowel Preparation Scale) with scores of: Right                            Colon = 3 (entire mucosa seen well with no residual  staining, small fragments of stool or opaque                            liquid), Transverse Colon = 3 (entire mucosa seen                            well with no residual staining, small fragments of                            stool or opaque liquid) and Left Colon = 3 (entire                            mucosa seen well with no residual staining, small                            fragments of stool or opaque liquid). The total                            BBPS score equals 9. The quality of the bowel                            preparation was good. The ileocecal valve,                            appendiceal orifice, and rectum were photographed. Scope In: 7:46:12 AM Scope Out: 8:01:41 AM Scope Withdrawal Time: 0 hours 11 minutes 50 seconds  Total Procedure Duration: 0 hours 15 minutes 29 seconds  Findings:      A 3 mm polyp was found in the transverse colon. The polyp was sessile.       The polyp was removed with a cold snare. Resection and retrieval were        complete.      A few medium-mouthed and small-mouthed diverticula were found in the       sigmoid colon. Impression:               - One 3 mm polyp in the transverse colon, removed                            with a cold snare. Resected and retrieved.                           - Diverticulosis in the sigmoid colon. Moderate Sedation:      Not Applicable - Patient had care per Anesthesia. Recommendation:           - Patient has a contact number available for                            emergencies. The signs and symptoms of potential                            delayed complications were discussed with the  patient. Return to normal activities tomorrow.                            Written discharge instructions were provided to the                            patient.                           - Resume previous diet.                           - Continue present medications.                           - Await pathology results.                           - Repeat colonoscopy is not recommended for                            surveillance. Procedure Code(s):        --- Professional ---                           [phone], Colonoscopy, flexible; with removal of                            tumor(s), polyp(s), or other lesion(s) by snare                            technique Diagnosis Code(s):        --- Professional ---                           D12.3, Benign neoplasm of transverse colon (hepatic                            flexure or splenic flexure)                           Z86.010, Personal history of colonic polyps                           K57.30, Diverticulosis of large intestine without                            perforation or abscess without bleeding CPT copyright 2022 American Medical Association. All rights reserved. The codes documented in this report are preliminary and upon coder review may  be revised to meet current compliance requirements. Jeani Hawking,  MD Jeani Hawking, MD 07/11/2023 8:08:34 AM This report has been signed electronically. Number of Addenda: 0

## 2023-07-11 NOTE — Anesthesia Postprocedure Evaluation (Signed)
Anesthesia Post Note  Patient: [name]  Procedure(s) Performed: COLONOSCOPY WITH PROPOFOL POLYPECTOMY     Patient location during evaluation: Endoscopy Anesthesia Type: MAC Level of consciousness: awake and alert Pain management: pain level controlled Vital Signs Assessment: post-procedure vital signs reviewed and stable Respiratory status: spontaneous breathing, nonlabored ventilation, respiratory function stable and patient connected to nasal cannula oxygen Cardiovascular status: stable and blood pressure returned to baseline Postop Assessment: no apparent nausea or vomiting Anesthetic complications: no   No notable events documented.  Last Vitals:  Vitals:   07/11/23 0817 07/11/23 0827  BP: (!) 118/51 121/73  Pulse: 92 90  Resp: 19 17  Temp:    SpO2: 96% 97%    Last Pain:  Vitals:   07/11/23 0827  TempSrc:   PainSc: 0-No pain                 Mykel Mohl S

## 2023-07-11 NOTE — Anesthesia Preprocedure Evaluation (Signed)
Anesthesia Evaluation  Patient identified by MRN, date of birth, ID band Patient awake    Reviewed: Allergy & Precautions, H&P , NPO status , Patient's Chart, lab work & pertinent test results  History of Anesthesia Complications (+) PONV and history of anesthetic complications  Airway Mallampati: II   Neck ROM: full    Dental   Pulmonary sleep apnea    breath sounds clear to auscultation       Cardiovascular hypertension,  Rhythm:regular Rate:Normal     Neuro/Psych  Neuromuscular disease    GI/Hepatic ,GERD  ,,  Endo/Other  diabetes, Type 2    Renal/GU      Musculoskeletal  (+) Arthritis ,    Abdominal   Peds  Hematology   Anesthesia Other Findings   Reproductive/Obstetrics                             Anesthesia Physical Anesthesia Plan  ASA: 3  Anesthesia Plan: MAC   Post-op Pain Management:    Induction: Intravenous  PONV Risk Score and Plan: 3 and Propofol infusion and Treatment may vary due to age or medical condition  Airway Management Planned: Nasal Cannula  Additional Equipment:   Intra-op Plan:   Post-operative Plan:   Informed Consent: I have reviewed the patients History and Physical, chart, labs and discussed the procedure including the risks, benefits and alternatives for the proposed anesthesia with the patient or authorized representative who has indicated his/her understanding and acceptance.     Dental advisory given  Plan Discussed with: CRNA, Anesthesiologist and Surgeon  Anesthesia Plan Comments:        Anesthesia Quick Evaluation

## 2023-07-11 NOTE — Discharge Instructions (Signed)

## 2023-07-11 NOTE — H&P (Signed)
Velda Shell Loyd HPI: This 72 year old white female presents to the office for colorectal cancer screening. She has a history of chronic constipation and can go 3-4 days without having a BM. After being constipated for a few days, she can have 1-2-3 BM's per day. She averages 1 BM a day with no obvious blood or mucus in the stool. She has acid reflux and takes sodium bicarbonate to help with her symptoms. She has a good appetite and her weight has been stable. She denies having any complaints of abdominal pain, nausea, vomiting, dysphagia or odynophagia. She denies having a family history of colon cancer, celiac sprue or IBD. Her last colonoscopy was done on 07/09/2018 when a tubular adenoma was removed.  Past Medical History:  Diagnosis Date   Allergic rhinitis    Carbuncle, trunk    Carpal tunnel syndrome    Chronic kidney disease    Combined hyperlipidemia associated with type 2 diabetes mellitus (HCC)    Complication of anesthesia    Cyclical vomiting    Dependent edema    Diabetes mellitus type II    Diabetic autonomic neuropathy (HCC)    Diabetic peripheral neuropathy associated with type 2 diabetes mellitus (HCC)    Dyspepsia    Essential hypertension, benign    GERD (gastroesophageal reflux disease)    Goiter    Hypercholesteremia    Menopause    Obesity    OSA (obstructive sleep apnea) 09/23/2016   Osteopenia    PONV (postoperative nausea and vomiting)    Tachycardia    Type II or unspecified type diabetes mellitus without mention of complication, not stated as uncontrolled     Past Surgical History:  Procedure Laterality Date   CESAREAN SECTION  1983   COLONOSCOPY WITH PROPOFOL N/A 07/09/2018   Procedure: COLONOSCOPY WITH PROPOFOL;  Surgeon: Charna Elizabeth, MD;  Location: [facility];  Service: Endoscopy;  Laterality: N/A;   DILATION AND CURETTAGE OF UTERUS  1988   GANGLION CYST EXCISION  1959   rt wrist   POLYPECTOMY  07/09/2018   Procedure: POLYPECTOMY;  Surgeon:  Charna Elizabeth, MD;  Location: [facility];  Service: Endoscopy;;    Family History  Problem Relation Age of Onset   COPD Mother    Hypertension Mother    Thyroid disease Mother    Heart disease Father    Hyperlipidemia Father    Diabetes Father    Heart disease Sister    Cancer Maternal Grandfather    Diabetes Paternal Grandmother    Breast cancer Neg Hx     Social History:  reports that she has never smoked. She has never used smokeless tobacco. She reports that she does not drink alcohol and does not use drugs.  Allergies:  Allergies  Allergen Reactions   Iodinated Contrast Media Other (See Comments)    Kidney   Metformin     Other reaction(s): Other (See Comments), Unknown "Facial puffiness"  Pt stated her kidney specialist told her to add metformin to her drug allergies list.    Metformin And Related Other (See Comments)    KIDNEY FAILURE?   Ciprocin-Fluocin-Procin [Fluocinolone]     Acute kidney failure   Ciprofloxacin Other (See Comments)    Kidney failure   Fenofibrate     Other reaction(s): fever   Insulin Degludec Other (See Comments)    Headache   Insulins Other (See Comments)    Migraines, nausea Other reaction(s): Other (See Comments), Unknown Migraines, nausea NOVLOG    Niacin  Other reaction(s): Unknown   Orange Fruit [Citrus] Other (See Comments)    Orange juice   Ramipril     Other reaction(s): cough   Sulfamethoxazole-Trimethoprim     Other reaction(s): Unknown   Insulin Aspart Rash   Sulfa Antibiotics Rash    Medications: Scheduled: Continuous:  sodium chloride     lactated ringers 1,000 mL (07/11/23 0657)    Results for orders placed or performed during the hospital encounter of 07/11/23 (from the past 24 hour(s))  Glucose, capillary     Status: Abnormal   Collection Time: 07/11/23  6:53 AM  Result Value Ref Range   Glucose-Capillary 109 (H) 70 - 99 mg/dL     No results found.  ROS:  As stated above in the HPI otherwise  negative.  Pulse 98, temperature (!) 97.5 F (36.4 C), temperature source Tympanic, resp. rate 20, height 5\' 4"  (1.626 m), weight 95.3 kg, SpO2 99%.    PE: Gen: NAD, Alert and Oriented HEENT:  [place]/AT, EOMI Neck: Supple, no LAD Lungs: CTA Bilaterally CV: RRR without M/G/R ABD: Soft, NTND, +BS Ext: No C/C/E  Assessment/Plan: 1) Personal history of polyps - colonoscopy.  Lealand Elting D 07/11/2023, 7:26 AM

## 2023-07-14 ENCOUNTER — Encounter (HOSPITAL_COMMUNITY): Payer: Self-pay | Admitting: Gastroenterology

## 2023-07-14 DIAGNOSIS — Z5189 Encounter for other specified aftercare: Secondary | ICD-10-CM | POA: Diagnosis not present

## 2023-07-15 LAB — SURGICAL PATHOLOGY

## 2023-07-21 DIAGNOSIS — E1122 Type 2 diabetes mellitus with diabetic chronic kidney disease: Secondary | ICD-10-CM | POA: Diagnosis not present

## 2023-07-21 DIAGNOSIS — Z794 Long term (current) use of insulin: Secondary | ICD-10-CM | POA: Diagnosis not present

## 2023-07-21 DIAGNOSIS — E669 Obesity, unspecified: Secondary | ICD-10-CM | POA: Diagnosis not present

## 2023-07-21 DIAGNOSIS — Z79899 Other long term (current) drug therapy: Secondary | ICD-10-CM | POA: Diagnosis not present

## 2023-07-22 DIAGNOSIS — E669 Obesity, unspecified: Secondary | ICD-10-CM | POA: Diagnosis not present

## 2023-07-22 DIAGNOSIS — Z6836 Body mass index (BMI) 36.0-36.9, adult: Secondary | ICD-10-CM | POA: Diagnosis not present

## 2023-07-22 NOTE — Patient Instructions (Signed)
You have been diagnosed with an acute upper respiratory infection.  Please take Zithromax Z-PAK 2 tabs day 1 followed by 1 tab days 2 through 5.  Please contact gastroenterology office and let them know you have been diagnosed with this today prior to proceeding with colonoscopy.

## 2023-08-07 DIAGNOSIS — E781 Pure hyperglyceridemia: Secondary | ICD-10-CM | POA: Diagnosis not present

## 2023-08-07 DIAGNOSIS — R0609 Other forms of dyspnea: Secondary | ICD-10-CM | POA: Diagnosis not present

## 2023-08-07 DIAGNOSIS — N184 Chronic kidney disease, stage 4 (severe): Secondary | ICD-10-CM | POA: Diagnosis not present

## 2023-08-07 DIAGNOSIS — I10 Essential (primary) hypertension: Secondary | ICD-10-CM | POA: Diagnosis not present

## 2023-08-07 DIAGNOSIS — R9439 Abnormal result of other cardiovascular function study: Secondary | ICD-10-CM | POA: Diagnosis not present

## 2023-09-04 DIAGNOSIS — E781 Pure hyperglyceridemia: Secondary | ICD-10-CM | POA: Diagnosis not present

## 2023-09-04 DIAGNOSIS — R0609 Other forms of dyspnea: Secondary | ICD-10-CM | POA: Diagnosis not present

## 2023-09-04 DIAGNOSIS — E119 Type 2 diabetes mellitus without complications: Secondary | ICD-10-CM | POA: Diagnosis not present

## 2023-09-04 DIAGNOSIS — Z01818 Encounter for other preprocedural examination: Secondary | ICD-10-CM | POA: Diagnosis not present

## 2023-09-04 DIAGNOSIS — Z114 Encounter for screening for human immunodeficiency virus [HIV]: Secondary | ICD-10-CM | POA: Diagnosis not present

## 2023-09-04 DIAGNOSIS — I10 Essential (primary) hypertension: Secondary | ICD-10-CM | POA: Diagnosis not present

## 2023-09-04 DIAGNOSIS — N184 Chronic kidney disease, stage 4 (severe): Secondary | ICD-10-CM | POA: Diagnosis not present

## 2023-09-04 DIAGNOSIS — Z1159 Encounter for screening for other viral diseases: Secondary | ICD-10-CM | POA: Diagnosis not present

## 2023-09-04 DIAGNOSIS — Z794 Long term (current) use of insulin: Secondary | ICD-10-CM | POA: Diagnosis not present

## 2023-09-04 DIAGNOSIS — E1122 Type 2 diabetes mellitus with diabetic chronic kidney disease: Secondary | ICD-10-CM | POA: Diagnosis not present

## 2023-09-04 DIAGNOSIS — N135 Crossing vessel and stricture of ureter without hydronephrosis: Secondary | ICD-10-CM | POA: Diagnosis not present

## 2023-09-11 DIAGNOSIS — N184 Chronic kidney disease, stage 4 (severe): Secondary | ICD-10-CM | POA: Diagnosis not present

## 2023-09-11 DIAGNOSIS — E1121 Type 2 diabetes mellitus with diabetic nephropathy: Secondary | ICD-10-CM | POA: Diagnosis not present

## 2023-09-15 ENCOUNTER — Other Ambulatory Visit (HOSPITAL_BASED_OUTPATIENT_CLINIC_OR_DEPARTMENT_OTHER): Payer: Self-pay

## 2023-09-15 MED ORDER — FLUAD 0.5 ML IM SUSY
0.5000 mL | PREFILLED_SYRINGE | Freq: Once | INTRAMUSCULAR | 0 refills | Status: AC
  Filled 2023-09-15: qty 0.5, 1d supply, fill #0

## 2023-09-19 DIAGNOSIS — E785 Hyperlipidemia, unspecified: Secondary | ICD-10-CM | POA: Diagnosis not present

## 2023-09-19 DIAGNOSIS — N179 Acute kidney failure, unspecified: Secondary | ICD-10-CM | POA: Diagnosis not present

## 2023-09-19 DIAGNOSIS — E875 Hyperkalemia: Secondary | ICD-10-CM | POA: Diagnosis not present

## 2023-09-19 DIAGNOSIS — N184 Chronic kidney disease, stage 4 (severe): Secondary | ICD-10-CM | POA: Diagnosis not present

## 2023-09-19 DIAGNOSIS — I129 Hypertensive chronic kidney disease with stage 1 through stage 4 chronic kidney disease, or unspecified chronic kidney disease: Secondary | ICD-10-CM | POA: Diagnosis not present

## 2023-09-19 DIAGNOSIS — N2581 Secondary hyperparathyroidism of renal origin: Secondary | ICD-10-CM | POA: Diagnosis not present

## 2023-09-19 DIAGNOSIS — N2589 Other disorders resulting from impaired renal tubular function: Secondary | ICD-10-CM | POA: Diagnosis not present

## 2023-09-19 DIAGNOSIS — R809 Proteinuria, unspecified: Secondary | ICD-10-CM | POA: Diagnosis not present

## 2023-09-19 DIAGNOSIS — E1122 Type 2 diabetes mellitus with diabetic chronic kidney disease: Secondary | ICD-10-CM | POA: Diagnosis not present

## 2023-09-23 ENCOUNTER — Ambulatory Visit (INDEPENDENT_AMBULATORY_CARE_PROVIDER_SITE_OTHER): Payer: Medicare PPO | Admitting: Podiatry

## 2023-09-23 ENCOUNTER — Encounter: Payer: Self-pay | Admitting: Podiatry

## 2023-09-23 DIAGNOSIS — E1142 Type 2 diabetes mellitus with diabetic polyneuropathy: Secondary | ICD-10-CM | POA: Diagnosis not present

## 2023-09-23 DIAGNOSIS — M79674 Pain in right toe(s): Secondary | ICD-10-CM

## 2023-09-23 DIAGNOSIS — M79675 Pain in left toe(s): Secondary | ICD-10-CM

## 2023-09-23 DIAGNOSIS — B351 Tinea unguium: Secondary | ICD-10-CM | POA: Diagnosis not present

## 2023-09-24 NOTE — Progress Notes (Signed)
Subjective:  Patient ID: [name], female    DOB: Jun 09, 1951,  MRN: [id number]  [name] presents to clinic today for at risk foot care with history of diabetic neuropathy and painful elongated mycotic toenails 1-5 bilaterally which are tender when wearing enclosed shoe gear. Pain is relieved with periodic professional debridement. She states left great toe is a little sore on the lateral border today. Would like to discuss ingrown toenail procedure and what it entails. She will be going on a cruise to [place] with her husband and friends next month. Chief Complaint  Patient presents with   Diabetes     [facility] BS - 100 A1C 6.1 LVPCP - 07/2023    PCP is Baxley, Luanna Cole, MD.  Allergies  Allergen Reactions   Iodinated Contrast Media Other (See Comments)    Kidney   Metformin     Other reaction(s): Other (See Comments), Unknown "Facial puffiness"  Pt stated her kidney specialist told her to add metformin to her drug allergies list.    Metformin And Related Other (See Comments)    KIDNEY FAILURE?   Ciprocin-Fluocin-Procin [Fluocinolone]     Acute kidney failure   Ciprofloxacin Other (See Comments)    Kidney failure   Fenofibrate     Other reaction(s): fever   Insulin Degludec Other (See Comments)    Headache   Insulins Other (See Comments)    Migraines, nausea Other reaction(s): Other (See Comments), Unknown Migraines, nausea NOVLOG    Niacin     Other reaction(s): Unknown   Orange Fruit [Citrus] Other (See Comments)    Orange juice   Ramipril     Other reaction(s): cough   Sulfamethoxazole-Trimethoprim     Other reaction(s): Unknown   Insulin Aspart Rash   Sulfa Antibiotics Rash    Review of Systems: Negative except as noted in the HPI.  Objective: No changes noted in today's physical examination. There were no vitals filed for this visit. [name] is a pleasant 72 y.o. female in NAD. AAO x 3.  Vascular  Examination: Capillary refill time to digits immediate b/l. Palpable pedal pulses b/l LE. Pedal hair sparse. No pain with calf compression b/l. Lower extremity skin temperature gradient within normal limits. No edema noted b/l LE. No cyanosis or clubbing noted b/l LE.  Dermatological Examination: Pedal integument with normal turgor, texture and tone BLE. No open wounds b/l LE. No interdigital macerations noted b/l LE. Toenails 1-5 b/l elongated, discolored, dystrophic, thickened, crumbly with subungual debris and tenderness to dorsal palpation.   Incurvated nailplate left great toe lateral border(s) with tenderness to palpation. No erythema, no edema, no drainage noted.   Minimal hyperkeratotic lesion(s) medial IPJ bilateral great toes and submet head 1 left foot.  No erythema, no edema, no drainage, no fluctuance.  Neurological Examination: Protective sensation intact 5/5 intact bilaterally with 10g monofilament b/l. Vibratory sensation intact b/l.  Musculoskeletal Examination: Muscle strength 5/5 to all lower extremity muscle groups bilaterally. HAV with bunion deformity noted b/l LE.  Assessment/Plan: 1. Pain due to onychomycosis of toenails of both feet   2. Diabetic peripheral neuropathy associated with type 2 diabetes mellitus (HCC)     -Consent given for treatment as described below: -Examined patient. -Discussed ingrown toenail procedure, both temporary and permanent. No signs of paronychia or infection on today's visit. Symptoms relieved with today's treatment. Advised to table procedure for now since she has upcoming travel scheduled. Advised her to take Neosporin and epsom salt on trip with  her in case toe starts to bother her during the trip. -Continue supportive shoe gear daily. -Toenails were debrided in length and girth 2-5 bilaterally and right great toe with sterile nail nippers and dremel without iatrogenic bleeding.  -No invasive procedure(s) performed. Offending nail  border debrided and curretaged lateral border left hallux utilizing sterile nail nipper and currette. Border(s) cleansed with alcohol and triple antibiotic ointment applied. Patient/POA/Caregiver/Facility instructed to apply Neosporin Cream  to left great toe once daily for 7 days. Call office if there are any concerns. -Patient/POA to call should there be question/concern in the interim.   Return in about 3 months (around 12/23/2023).  Freddie Breech, DPM

## 2023-10-01 DIAGNOSIS — H524 Presbyopia: Secondary | ICD-10-CM | POA: Diagnosis not present

## 2023-10-01 DIAGNOSIS — G4733 Obstructive sleep apnea (adult) (pediatric): Secondary | ICD-10-CM | POA: Diagnosis not present

## 2023-10-01 DIAGNOSIS — E119 Type 2 diabetes mellitus without complications: Secondary | ICD-10-CM | POA: Diagnosis not present

## 2023-10-01 DIAGNOSIS — Z961 Presence of intraocular lens: Secondary | ICD-10-CM | POA: Diagnosis not present

## 2023-10-01 LAB — HM DIABETES EYE EXAM

## 2023-10-13 ENCOUNTER — Other Ambulatory Visit (HOSPITAL_BASED_OUTPATIENT_CLINIC_OR_DEPARTMENT_OTHER): Payer: Self-pay

## 2023-10-13 MED ORDER — AREXVY 120 MCG/0.5ML IM SUSR
0.5000 mL | Freq: Once | INTRAMUSCULAR | 0 refills | Status: AC
  Filled 2023-10-13: qty 0.5, 1d supply, fill #0

## 2023-11-24 DIAGNOSIS — L814 Other melanin hyperpigmentation: Secondary | ICD-10-CM | POA: Diagnosis not present

## 2023-11-24 DIAGNOSIS — E1122 Type 2 diabetes mellitus with diabetic chronic kidney disease: Secondary | ICD-10-CM | POA: Diagnosis not present

## 2023-11-24 DIAGNOSIS — L578 Other skin changes due to chronic exposure to nonionizing radiation: Secondary | ICD-10-CM | POA: Diagnosis not present

## 2023-11-24 DIAGNOSIS — Z85820 Personal history of malignant melanoma of skin: Secondary | ICD-10-CM | POA: Diagnosis not present

## 2023-11-24 DIAGNOSIS — D225 Melanocytic nevi of trunk: Secondary | ICD-10-CM | POA: Diagnosis not present

## 2023-11-24 DIAGNOSIS — Z79899 Other long term (current) drug therapy: Secondary | ICD-10-CM | POA: Diagnosis not present

## 2023-11-24 DIAGNOSIS — L821 Other seborrheic keratosis: Secondary | ICD-10-CM | POA: Diagnosis not present

## 2023-11-24 DIAGNOSIS — Z808 Family history of malignant neoplasm of other organs or systems: Secondary | ICD-10-CM | POA: Diagnosis not present

## 2023-11-24 DIAGNOSIS — Z794 Long term (current) use of insulin: Secondary | ICD-10-CM | POA: Diagnosis not present

## 2023-11-24 DIAGNOSIS — E669 Obesity, unspecified: Secondary | ICD-10-CM | POA: Diagnosis not present

## 2023-12-12 DIAGNOSIS — N2581 Secondary hyperparathyroidism of renal origin: Secondary | ICD-10-CM | POA: Diagnosis not present

## 2023-12-12 DIAGNOSIS — N184 Chronic kidney disease, stage 4 (severe): Secondary | ICD-10-CM | POA: Diagnosis not present

## 2023-12-12 DIAGNOSIS — E875 Hyperkalemia: Secondary | ICD-10-CM | POA: Diagnosis not present

## 2023-12-12 DIAGNOSIS — E1122 Type 2 diabetes mellitus with diabetic chronic kidney disease: Secondary | ICD-10-CM | POA: Diagnosis not present

## 2023-12-12 DIAGNOSIS — R809 Proteinuria, unspecified: Secondary | ICD-10-CM | POA: Diagnosis not present

## 2023-12-12 DIAGNOSIS — N189 Chronic kidney disease, unspecified: Secondary | ICD-10-CM | POA: Diagnosis not present

## 2023-12-12 DIAGNOSIS — N179 Acute kidney failure, unspecified: Secondary | ICD-10-CM | POA: Diagnosis not present

## 2023-12-12 DIAGNOSIS — N2589 Other disorders resulting from impaired renal tubular function: Secondary | ICD-10-CM | POA: Diagnosis not present

## 2023-12-12 DIAGNOSIS — I129 Hypertensive chronic kidney disease with stage 1 through stage 4 chronic kidney disease, or unspecified chronic kidney disease: Secondary | ICD-10-CM | POA: Diagnosis not present

## 2023-12-14 LAB — LAB REPORT - SCANNED
Creatinine, POC: 84.6 mg/dL
EGFR: 18

## 2023-12-22 ENCOUNTER — Encounter: Payer: Self-pay | Admitting: Internal Medicine

## 2023-12-22 DIAGNOSIS — N184 Chronic kidney disease, stage 4 (severe): Secondary | ICD-10-CM | POA: Diagnosis not present

## 2023-12-22 NOTE — Telephone Encounter (Signed)
 Care team updated and letter sent for eye exam notes.

## 2024-01-05 ENCOUNTER — Ambulatory Visit (INDEPENDENT_AMBULATORY_CARE_PROVIDER_SITE_OTHER): Payer: Medicare PPO | Admitting: Podiatry

## 2024-01-05 DIAGNOSIS — Z91198 Patient's noncompliance with other medical treatment and regimen for other reason: Secondary | ICD-10-CM

## 2024-01-05 NOTE — Progress Notes (Signed)
 1. Failure to attend appointment with reason given    Rescheduled due to statewide inclement weather.

## 2024-01-07 ENCOUNTER — Other Ambulatory Visit: Payer: Self-pay | Admitting: Internal Medicine

## 2024-01-07 DIAGNOSIS — Z1231 Encounter for screening mammogram for malignant neoplasm of breast: Secondary | ICD-10-CM

## 2024-01-13 DIAGNOSIS — G4733 Obstructive sleep apnea (adult) (pediatric): Secondary | ICD-10-CM | POA: Diagnosis not present

## 2024-01-26 DIAGNOSIS — N184 Chronic kidney disease, stage 4 (severe): Secondary | ICD-10-CM | POA: Diagnosis not present

## 2024-02-04 DIAGNOSIS — I10 Essential (primary) hypertension: Secondary | ICD-10-CM | POA: Diagnosis not present

## 2024-02-04 DIAGNOSIS — E669 Obesity, unspecified: Secondary | ICD-10-CM | POA: Diagnosis not present

## 2024-02-04 DIAGNOSIS — E781 Pure hyperglyceridemia: Secondary | ICD-10-CM | POA: Diagnosis not present

## 2024-02-04 DIAGNOSIS — Z6835 Body mass index (BMI) 35.0-35.9, adult: Secondary | ICD-10-CM | POA: Diagnosis not present

## 2024-02-05 DIAGNOSIS — Z6835 Body mass index (BMI) 35.0-35.9, adult: Secondary | ICD-10-CM | POA: Diagnosis not present

## 2024-02-05 DIAGNOSIS — N184 Chronic kidney disease, stage 4 (severe): Secondary | ICD-10-CM | POA: Diagnosis not present

## 2024-02-05 DIAGNOSIS — M1A09X0 Idiopathic chronic gout, multiple sites, without tophus (tophi): Secondary | ICD-10-CM | POA: Diagnosis not present

## 2024-02-05 DIAGNOSIS — M65322 Trigger finger, left index finger: Secondary | ICD-10-CM | POA: Diagnosis not present

## 2024-02-05 DIAGNOSIS — E669 Obesity, unspecified: Secondary | ICD-10-CM | POA: Diagnosis not present

## 2024-02-05 DIAGNOSIS — M1991 Primary osteoarthritis, unspecified site: Secondary | ICD-10-CM | POA: Diagnosis not present

## 2024-02-05 DIAGNOSIS — R21 Rash and other nonspecific skin eruption: Secondary | ICD-10-CM | POA: Diagnosis not present

## 2024-02-23 ENCOUNTER — Ambulatory Visit
Admission: RE | Admit: 2024-02-23 | Discharge: 2024-02-23 | Disposition: A | Discharge status: Home / Self Care | Payer: Medicare PPO | Source: Ambulatory Visit | Attending: Internal Medicine | Admitting: Internal Medicine

## 2024-02-23 DIAGNOSIS — Z1231 Encounter for screening mammogram for malignant neoplasm of breast: Secondary | ICD-10-CM

## 2024-03-05 DIAGNOSIS — N2581 Secondary hyperparathyroidism of renal origin: Secondary | ICD-10-CM | POA: Diagnosis not present

## 2024-03-05 DIAGNOSIS — N179 Acute kidney failure, unspecified: Secondary | ICD-10-CM | POA: Diagnosis not present

## 2024-03-05 DIAGNOSIS — N2589 Other disorders resulting from impaired renal tubular function: Secondary | ICD-10-CM | POA: Diagnosis not present

## 2024-03-05 DIAGNOSIS — E1122 Type 2 diabetes mellitus with diabetic chronic kidney disease: Secondary | ICD-10-CM | POA: Diagnosis not present

## 2024-03-05 DIAGNOSIS — E785 Hyperlipidemia, unspecified: Secondary | ICD-10-CM | POA: Diagnosis not present

## 2024-03-05 DIAGNOSIS — E875 Hyperkalemia: Secondary | ICD-10-CM | POA: Diagnosis not present

## 2024-03-05 DIAGNOSIS — R809 Proteinuria, unspecified: Secondary | ICD-10-CM | POA: Diagnosis not present

## 2024-03-05 DIAGNOSIS — I129 Hypertensive chronic kidney disease with stage 1 through stage 4 chronic kidney disease, or unspecified chronic kidney disease: Secondary | ICD-10-CM | POA: Diagnosis not present

## 2024-03-05 DIAGNOSIS — N184 Chronic kidney disease, stage 4 (severe): Secondary | ICD-10-CM | POA: Diagnosis not present

## 2024-03-18 DIAGNOSIS — Z794 Long term (current) use of insulin: Secondary | ICD-10-CM | POA: Diagnosis not present

## 2024-03-18 DIAGNOSIS — Z79899 Other long term (current) drug therapy: Secondary | ICD-10-CM | POA: Diagnosis not present

## 2024-03-18 DIAGNOSIS — E1122 Type 2 diabetes mellitus with diabetic chronic kidney disease: Secondary | ICD-10-CM | POA: Diagnosis not present

## 2024-03-19 ENCOUNTER — Other Ambulatory Visit (HOSPITAL_BASED_OUTPATIENT_CLINIC_OR_DEPARTMENT_OTHER): Payer: Self-pay

## 2024-03-19 MED ORDER — LOKELMA 10 G PO PACK
1.0000 | PACK | ORAL | 5 refills | Status: AC
  Filled 2024-03-19: qty 15, 30d supply, fill #0
  Filled 2024-04-17 (×2): qty 15, 30d supply, fill #1
  Filled 2024-05-17: qty 15, 30d supply, fill #2
  Filled 2024-06-22: qty 15, 30d supply, fill #3
  Filled 2024-07-24: qty 15, 30d supply, fill #4
  Filled 2024-10-20: qty 15, 30d supply, fill #5

## 2024-03-19 MED ORDER — INSULIN GLARGINE 100 UNIT/ML SOLOSTAR PEN
34.0000 [IU] | PEN_INJECTOR | Freq: Every day | SUBCUTANEOUS | 3 refills | Status: AC
  Filled 2024-06-22: qty 30, 88d supply, fill #0
  Filled 2024-09-15: qty 30, 88d supply, fill #1

## 2024-03-19 MED ORDER — OZEMPIC (2 MG/DOSE) 8 MG/3ML ~~LOC~~ SOPN
2.0000 mg | PEN_INJECTOR | SUBCUTANEOUS | 3 refills | Status: AC
  Filled 2024-03-19: qty 9, 84d supply, fill #0
  Filled 2024-06-22: qty 9, 84d supply, fill #1
  Filled 2024-09-11 (×2): qty 9, 84d supply, fill #2
  Filled 2024-12-04: qty 9, 84d supply, fill #3

## 2024-03-19 MED ORDER — DEXCOM G7 SENSOR MISC
11 refills | Status: DC
Start: 2023-05-22 — End: 2024-05-27
  Filled 2024-03-25: qty 3, 30d supply, fill #0

## 2024-03-22 ENCOUNTER — Other Ambulatory Visit: Payer: Self-pay

## 2024-03-22 DIAGNOSIS — H43813 Vitreous degeneration, bilateral: Secondary | ICD-10-CM | POA: Diagnosis not present

## 2024-03-23 ENCOUNTER — Other Ambulatory Visit (HOSPITAL_BASED_OUTPATIENT_CLINIC_OR_DEPARTMENT_OTHER): Payer: Self-pay

## 2024-03-23 ENCOUNTER — Other Ambulatory Visit: Payer: Medicare PPO

## 2024-03-23 DIAGNOSIS — E1169 Type 2 diabetes mellitus with other specified complication: Secondary | ICD-10-CM

## 2024-03-23 DIAGNOSIS — E119 Type 2 diabetes mellitus without complications: Secondary | ICD-10-CM

## 2024-03-23 DIAGNOSIS — Z0000 Encounter for general adult medical examination without abnormal findings: Secondary | ICD-10-CM

## 2024-03-23 DIAGNOSIS — I10 Essential (primary) hypertension: Secondary | ICD-10-CM | POA: Diagnosis not present

## 2024-03-23 DIAGNOSIS — E785 Hyperlipidemia, unspecified: Secondary | ICD-10-CM | POA: Diagnosis not present

## 2024-03-23 MED ORDER — FEBUXOSTAT 40 MG PO TABS
40.0000 mg | ORAL_TABLET | Freq: Every day | ORAL | 3 refills | Status: AC
  Filled 2024-03-23: qty 90, 90d supply, fill #0
  Filled 2024-06-22: qty 90, 90d supply, fill #1
  Filled 2024-09-17: qty 90, 90d supply, fill #2
  Filled 2024-12-19: qty 90, 90d supply, fill #3

## 2024-03-23 NOTE — Progress Notes (Signed)
 Lab only

## 2024-03-24 LAB — CBC WITH DIFFERENTIAL/PLATELET
Absolute Lymphocytes: 1312 {cells}/uL (ref 850–3900)
Absolute Monocytes: 464 {cells}/uL (ref 200–950)
Basophils Absolute: 49 {cells}/uL (ref 0–200)
Basophils Relative: 0.8 %
Eosinophils Absolute: 250 {cells}/uL (ref 15–500)
Eosinophils Relative: 4.1 %
HCT: 44.1 % (ref 35.0–45.0)
Hemoglobin: 14.3 g/dL (ref 11.7–15.5)
MCH: 30.5 pg (ref 27.0–33.0)
MCHC: 32.4 g/dL (ref 32.0–36.0)
MCV: 94 fL (ref 80.0–100.0)
MPV: 8.8 fL (ref 7.5–12.5)
Monocytes Relative: 7.6 %
Neutro Abs: 4026 {cells}/uL (ref 1500–7800)
Neutrophils Relative %: 66 %
Platelets: 298 10*3/uL (ref 140–400)
RBC: 4.69 10*6/uL (ref 3.80–5.10)
RDW: 13.1 % (ref 11.0–15.0)
Total Lymphocyte: 21.5 %
WBC: 6.1 10*3/uL (ref 3.8–10.8)

## 2024-03-24 LAB — LIPID PANEL
Cholesterol: 159 mg/dL (ref <200)
HDL: 37 mg/dL — ABNORMAL LOW (ref >=50)
LDL Cholesterol (Calc): 88 mg/dL
Non-HDL Cholesterol (Calc): 122 mg/dL (ref <130)
Total CHOL/HDL Ratio: 4.3 (calc) (ref <5.0)
Triglycerides: 246 mg/dL — ABNORMAL HIGH (ref <150)

## 2024-03-24 LAB — COMPLETE METABOLIC PANEL WITH GFR
AG Ratio: 1.7 (calc) (ref 1.0–2.5)
ALT: 17 U/L (ref 6–29)
AST: 22 U/L (ref 10–35)
Albumin: 4.3 g/dL (ref 3.6–5.1)
Alkaline phosphatase (APISO): 108 U/L (ref 37–153)
BUN/Creatinine Ratio: 13 (calc) (ref 6–22)
BUN: 35 mg/dL — ABNORMAL HIGH (ref 7–25)
CO2: 29 mmol/L (ref 20–32)
Calcium: 10 mg/dL (ref 8.6–10.4)
Chloride: 107 mmol/L (ref 98–110)
Creat: 2.65 mg/dL — ABNORMAL HIGH (ref 0.60–1.00)
Globulin: 2.6 g/dL (ref 1.9–3.7)
Glucose, Bld: 82 mg/dL (ref 65–99)
Potassium: 5.1 mmol/L (ref 3.5–5.3)
Sodium: 144 mmol/L (ref 135–146)
Total Bilirubin: 0.5 mg/dL (ref 0.2–1.2)
Total Protein: 6.9 g/dL (ref 6.1–8.1)

## 2024-03-24 LAB — MICROALBUMIN / CREATININE URINE RATIO
Creatinine, Urine: 86 mg/dL (ref 20–275)
Microalb Creat Ratio: 230 mg/g{creat} — ABNORMAL HIGH (ref <30)
Microalb, Ur: 19.8 mg/dL

## 2024-03-24 LAB — HEMOGLOBIN A1C
Hgb A1c MFr Bld: 6 %{Hb} — ABNORMAL HIGH (ref <5.7)
Mean Plasma Glucose: 126 mg/dL
eAG (mmol/L): 7 mmol/L

## 2024-03-24 LAB — TSH: TSH: 1.83 m[IU]/L (ref 0.40–4.50)

## 2024-03-25 ENCOUNTER — Other Ambulatory Visit (HOSPITAL_COMMUNITY)
Admission: RE | Admit: 2024-03-25 | Discharge: 2024-03-25 | Disposition: A | Discharge status: Home / Self Care | Source: Ambulatory Visit | Attending: Internal Medicine | Admitting: Internal Medicine

## 2024-03-25 ENCOUNTER — Other Ambulatory Visit (HOSPITAL_BASED_OUTPATIENT_CLINIC_OR_DEPARTMENT_OTHER): Payer: Self-pay

## 2024-03-25 ENCOUNTER — Ambulatory Visit (INDEPENDENT_AMBULATORY_CARE_PROVIDER_SITE_OTHER): Payer: Medicare PPO | Admitting: Internal Medicine

## 2024-03-25 ENCOUNTER — Encounter: Payer: Self-pay | Admitting: Internal Medicine

## 2024-03-25 VITALS — BP 132/80 | HR 80 | Temp 97.4°F | Resp 12 | Ht 64.0 in | Wt 201.8 lb

## 2024-03-25 DIAGNOSIS — B351 Tinea unguium: Secondary | ICD-10-CM | POA: Diagnosis not present

## 2024-03-25 DIAGNOSIS — E1169 Type 2 diabetes mellitus with other specified complication: Secondary | ICD-10-CM | POA: Diagnosis not present

## 2024-03-25 DIAGNOSIS — Z780 Asymptomatic menopausal state: Secondary | ICD-10-CM

## 2024-03-25 DIAGNOSIS — Z01419 Encounter for gynecological examination (general) (routine) without abnormal findings: Secondary | ICD-10-CM | POA: Diagnosis not present

## 2024-03-25 DIAGNOSIS — N184 Chronic kidney disease, stage 4 (severe): Secondary | ICD-10-CM | POA: Diagnosis not present

## 2024-03-25 DIAGNOSIS — R82998 Other abnormal findings in urine: Secondary | ICD-10-CM | POA: Diagnosis not present

## 2024-03-25 DIAGNOSIS — G4733 Obstructive sleep apnea (adult) (pediatric): Secondary | ICD-10-CM | POA: Diagnosis not present

## 2024-03-25 DIAGNOSIS — Z6834 Body mass index (BMI) 34.0-34.9, adult: Secondary | ICD-10-CM

## 2024-03-25 DIAGNOSIS — Z1151 Encounter for screening for human papillomavirus (HPV): Secondary | ICD-10-CM | POA: Insufficient documentation

## 2024-03-25 DIAGNOSIS — E119 Type 2 diabetes mellitus without complications: Secondary | ICD-10-CM

## 2024-03-25 DIAGNOSIS — Z8739 Personal history of other diseases of the musculoskeletal system and connective tissue: Secondary | ICD-10-CM

## 2024-03-25 DIAGNOSIS — I10 Essential (primary) hypertension: Secondary | ICD-10-CM

## 2024-03-25 DIAGNOSIS — E785 Hyperlipidemia, unspecified: Secondary | ICD-10-CM

## 2024-03-25 DIAGNOSIS — Z124 Encounter for screening for malignant neoplasm of cervix: Secondary | ICD-10-CM | POA: Diagnosis not present

## 2024-03-25 DIAGNOSIS — Z0000 Encounter for general adult medical examination without abnormal findings: Secondary | ICD-10-CM

## 2024-03-25 LAB — POCT URINALYSIS DIP (MANUAL ENTRY)
Bilirubin, UA: NEGATIVE
Glucose, UA: NEGATIVE mg/dL
Ketones, POC UA: NEGATIVE mg/dL
Nitrite, UA: NEGATIVE
Protein Ur, POC: 30 mg/dL — AB
Spec Grav, UA: 1.01 (ref 1.010–1.025)
Urobilinogen, UA: 0.2 U/dL
pH, UA: 7.5 (ref 5.0–8.0)

## 2024-03-25 NOTE — Progress Notes (Signed)
 Annual Medicare Wellness Visit   Patient Care Team: Ravleen Ries, Luanna Cole, MD as PCP - General (Internal Medicine) Donnetta Hail, MD as Consulting Physician (Rheumatology) [facility]  Visit Date: 03/25/24   Chief Complaint  Patient presents with   Annual Exam   Subjective:  Patient: [name], Female DOB: Nov 11, 1951, 73 y.o. MRN: [id number] [name] is a 73 y.o. Female who presents today for her Annual Wellness Visit. Patient has history of Allergic Rhinitis, Carbuncle Trunk, Carpal Tunnel Syndrome, Chronic Kidney Disease, Type 2 Diabetes, Hyperlipidemia, Cyclical Vomiting, Dependent Edema, Diabetic Autonomic Neuropathy, Diabetic Peripheral Neuropathy, Dyspepsia, Essential Hypertension, GERD, Goiter, Obesity, Obstructive Sleep Apnea, Osteopenia, Tachycardia.  History of Hypertension treated with Carvedilol 25 mg twice daily and Nitroglycerin as needed for chest pain. Dependent Edema treated with Lasix 20 mg. Blood Pressure: normotensive today at 132/80.   History of Hyperlipidemia treated with Atorvastatin 40 mg nightly, Zetia 10 mg nightly, and Lovaza 1 g twice daily. 03/23/2024 Lipid Panel, compared to 02/2023: HDL 37, decreased from 47; Triglycerides 246, elevated from 159.   History of Diabetes Mellitus, type II treated with Lantus 34 units injected daily, Semaglutide 2 mg injected weekly. Followed by Larene Pickett, PA at [facility]. 03/23/2024 HgbA1c, compared to 02/2023: 6.0, decreased from 7.3. Diabetic eye exam 10/01/2023 did not find any evidence of diabetic retinopathy with repeat recommended 2025.    History of Chronic Kidney Disease Stage 4. Followed by [facility], who she last saw on 3/7, and Terrial Rhodes w/ Nephrology. 03/23/2024 Kidney Functions, compared to 02/2023. BUN 35, decreased from 53; Creatinine 2.65, elevated from 2.37. Renal US in July 2022 did not show obstruction. She says that regarding kidney transplant  candidacy, she is at the top of the list but currently inactive. Apparently her creatinine and GFR have been fluctuating, which may be alarming her physicians at [facility]. In 2007 she had right-sided flank pain, thought to have nephrolithiasis but was found to have obstruction at the UP junction. Subsequently underwent reconstructive surgery and had an ileal diversion. Creatinine then was mildly elevated at 1.5. January 2008 she was admitted with dehydration and renal insufficiency.  Creatinine initially was 1.6 but improved with hydration to 1.19. She was on antibiotics for wound infection and had nausea and vomiting. In July 2009 she was admitted with acute renal failure with creatinine up to 6.0.  She had a left ureteral stent placed. Renogram showed poor function and poor clearance of the tracer from left kidney as well as increasing retention of the right kidney.  Retrograde pyelogram showed no evidence of obstruction.  He was never clear exactly what caused the acute renal failure.  It was thought to perhaps be acute on chronic injury.  There was possible contribution of obstruction and metformin.   History of Idiopathic Chronic Gout followed by [facility], who she last saw 01/2024, and is treated with Uloric 40 mg daily.  History of GERD managed with Sodium Bicarbonate 650 mg twice daily.   Followed by Podiatry for Onychomycosis, which she had an appointment scheduled 12/2023 that she did not present to.  History of Obstructive Sleep Apnea managed with CPAP. Followed by Dr. Craige Cotta, Pulmonologist, who she should f/u with in 04/2024 according to their last note.    Labs 03/23/2024 CBC: WNL CMP, compared to 02/2023: BUN 35, decreased from 53; Creatinine 2.65, elevated from 2.37; otherwise WNL.   TSH: 1.83  PAP Smear 03/08/19 negative for intraepithelial lesions or malignancy. She  says that [facility] still has this for routine maintenance, possibly due to her inactive status on the kidney  transplant list, so requests that pap be repeated today.   Mammogram 02/23/2024 w/o mammographic evidence of malignancy and recommended repeat in 04-29-2025.   Colonoscopy 07/11/2023 removed one 3 mm sessile Polyp from the transverse colon (colonic mucosa with benign lymphoid aggregate per pathology); found a few Medium & Small-mouthed Diverticula in the sigmoid colon. History of tubular adenoma in Apr 29, 2018 on colonoscopy by Dr. Loreta Ave.   Bone Density overdue since 30-Apr-2023. Last completed 09/03/2022 w/ T-score Femur Neck Left -1.3. osteopenic. History of Osteopenia but according to prior notes has not wanted to be on therapy; Dr. Eliott Nine had told her she could take Prolia but thus far patient has declined.   Vaccine Counseling: Due for Covid-19 and Tdap; UTD on Flu, Shingles 2/2, and PNA. Past Medical History:  Diagnosis Date   Allergic rhinitis    Carbuncle, trunk    Carpal tunnel syndrome    Chronic kidney disease    Combined hyperlipidemia associated with type 2 diabetes mellitus (HCC)    Complication of anesthesia    Cyclical vomiting    Dependent edema    Diabetes mellitus type II    Diabetic autonomic neuropathy (HCC)    Diabetic peripheral neuropathy associated with type 2 diabetes mellitus (HCC)    Dyspepsia    Essential hypertension, benign    GERD (gastroesophageal reflux disease)    Goiter    Hypercholesteremia    Menopause    Obesity    OSA (obstructive sleep apnea) 09/23/2016   Osteopenia    PONV (postoperative nausea and vomiting)    Tachycardia    Type II or unspecified type diabetes mellitus without mention of complication, not stated as uncontrolled   Medical/Surgical History Narrative:  Allergic/Intolerant to: Sulfa - unknown reaction; Altace - cough.   Apr 29, 2020 - seen by Dr. Jacquenette Shone for consultation about Possible Recurrence in Right Lower Quadrant and Possible Hernia in Left Lower Quadrant. CT did not show recurrence or new hernia.  29-Apr-2010 - Incisional Ventral Hernia Repair w/ Mesh in  December by Dr. Jacquenette Shone at [facility].   Apr 29, 2009 - Hernia Repair by Dr. Thomasena Edis at [facility]   2005/04/29 - Carpal Tunnel Syndrome   1998 - D&C   1993 - C-section   04/29/58 - Ganglion Cyst removed from Right Wrist.   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 Family History  Problem Relation Age of Onset   COPD Mother    Hypertension Mother    Thyroid disease Mother    Heart disease Father    Hyperlipidemia Father    Diabetes Father    Heart disease Sister    Cancer Maternal Grandfather    Diabetes Paternal Grandmother    Breast cancer Neg Hx     Social History   Social History Narrative   Social history: She is married.  1 adult daughter who is a Diplomatic Services operational officer in [place].  She does not smoke.  Occasional wine consumption.  Formerly worked for [employer] but has retired.       Family history: Father with history of aortic valve replacement, hyperlipidemia, rheumatic heart disease.  Mother with history of hypertension, hypothyroidism, hyperlipidemia, COPD.  1 sister died in 04-29-04 with coronary artery disease and hyperlipidemia.  1 sister living with hyperlipidemia.   Review of Systems  Constitutional:  Negative  for chills, fever, malaise/fatigue and weight loss.  HENT:  Negative for hearing loss, sinus pain and sore throat.   Respiratory:  Negative for cough, hemoptysis and shortness of breath.   Cardiovascular:  Negative for chest pain, palpitations, leg swelling and PND.  Gastrointestinal:  Negative for abdominal pain, constipation, diarrhea, heartburn, nausea and vomiting.  Genitourinary:  Negative for dysuria, frequency and urgency.  Musculoskeletal:  Negative for back pain, myalgias and neck pain.  Skin:  Negative for itching and rash.  Neurological:  Negative for dizziness, tingling, seizures and headaches.  Endo/Heme/Allergies:  Negative for polydipsia.  Psychiatric/Behavioral:   Negative for depression. The patient is not nervous/anxious.     Objective:  Vitals: BP 132/80 (BP Location: Right Arm, Patient Position: Sitting, Cuff Size: Normal)   Pulse 80   Temp (!) 97.4 F (36.3 C) (Temporal)   Resp 12   Ht 5\' 4"  (1.626 m)   Wt 201 lb 12.8 oz (91.5 kg)   SpO2 96%   BMI 34.64 kg/m  Physical Exam Vitals and nursing note reviewed.  Constitutional:      General: She is not in acute distress.    Appearance: Normal appearance. She is not ill-appearing or toxic-appearing.  HENT:     Head: Normocephalic and atraumatic.     Right Ear: Hearing, tympanic membrane, ear canal and external ear normal.     Left Ear: Hearing, tympanic membrane, ear canal and external ear normal.     Mouth/Throat:     Pharynx: Oropharynx is clear.  Eyes:     Extraocular Movements: Extraocular movements intact.     Pupils: Pupils are equal, round, and reactive to light.  Neck:     Thyroid: No thyroid mass, thyromegaly or thyroid tenderness.     Vascular: No carotid bruit.  Cardiovascular:     Rate and Rhythm: Normal rate and regular rhythm. No extrasystoles are present.    Pulses:          Dorsalis pedis pulses are 2+ on the right side and 2+ on the left side.     Heart sounds: Normal heart sounds, S1 normal and S2 normal. No murmur heard.    No friction rub. No gallop.  Pulmonary:     Effort: Pulmonary effort is normal.     Breath sounds: Normal breath sounds. No decreased breath sounds, wheezing, rhonchi or rales.  Chest:     Chest wall: No mass.  Abdominal:     Palpations: Abdomen is soft. There is no hepatomegaly, splenomegaly or mass.     Tenderness: There is no abdominal tenderness.     Hernia: No hernia is present.  Genitourinary:    Adnexa: Right adnexa normal and left adnexa normal.       Right: No mass, tenderness or fullness.         Left: No mass, tenderness or fullness.       Rectum: Guaiac result negative.     Comments: PAP smear done today per patient request No  masses on bimanual exam Musculoskeletal:     Cervical back: Normal range of motion.     Right lower leg: No edema.     Left lower leg: No edema.  Lymphadenopathy:     Cervical: No cervical adenopathy.     Upper Body:     Right upper body: No supraclavicular adenopathy.     Left upper body: No supraclavicular adenopathy.  Skin:    General: Skin is warm and dry.  Neurological:  General: No focal deficit present.     Mental Status: She is alert and oriented to person, place, and time. Mental status is at baseline.     Sensory: Sensation is intact.     Motor: Motor function is intact. No weakness.     Deep Tendon Reflexes: Reflexes are normal and symmetric.  Psychiatric:        Attention and Perception: Attention normal.        Mood and Affect: Mood normal.        Speech: Speech normal.        Behavior: Behavior normal.        Thought Content: Thought content normal.        Cognition and Memory: Cognition normal.        Judgment: Judgment normal.   Most Recent Fall Risk Assessment:    03/24/2023    3:00 PM  Fall Risk   Falls in the past year? 0  Number falls in past yr: 0  Injury with Fall? 0  Risk for fall due to : No Fall Risks  Follow up Falls prevention discussed   Most Recent Depression Screenings:    03/24/2023    3:00 PM 01/21/2023    3:59 PM  PHQ 2/9 Scores  PHQ - 2 Score 0 0   Most Recent Cognitive Screening:    03/24/2023    3:02 PM  6CIT Screen  What Year? 0 points  What month? 0 points  What time? 0 points  Count back from 20 0 points  Months in reverse 0 points  Repeat phrase 0 points  Total Score 0 points   Results:  Studies Obtained And Personally Reviewed By Me:  Renal US in July 2022 did not show obstruction.  PAP Smear 03/08/19 negative for intraepithelial lesions or malignancy.   Mammogram 02/23/2024 w/o mammographic evidence of malignancy.   Colonoscopy 07/11/2023 removed one 3 mm sessile Polyp from the transverse colon (colonic mucosa  with benign lymphoid aggregate per pathology); found a few Medium & Small-mouthed Diverticula in the sigmoid colon.    Bone Density overdue since 2024. Last completed 09/03/2022 w/ T-score Femur Neck Left -1.3. osteopenic.   Labs:     Component Value Date/Time   NA 144 03/23/2024 0914   K 5.1 03/23/2024 0914   CL 107 03/23/2024 0914   CO2 29 03/23/2024 0914   GLUCOSE 82 03/23/2024 0914   BUN 35 (H) 03/23/2024 0914   CREATININE 2.65 (H) 03/23/2024 0914   CALCIUM 10.0 03/23/2024 0914   CALCIUM 8.3 (L) 07/17/2008 1225   PROT 6.9 03/23/2024 0914   ALBUMIN 3.8 02/23/2015 1004   AST 22 03/23/2024 0914   ALT 17 03/23/2024 0914   ALKPHOS 82 02/23/2015 1004   BILITOT 0.5 03/23/2024 0914   GFRNONAA 23 (L) 10/06/2021 0048   GFRNONAA 23 (L) 03/09/2021 1123   GFRAA 26 (L) 03/09/2021 1123    Lab Results  Component Value Date   WBC 6.1 03/23/2024   HGB 14.3 03/23/2024   HCT 44.1 03/23/2024   MCV 94.0 03/23/2024   PLT 298 03/23/2024   Lab Results  Component Value Date   CHOL 159 03/23/2024   HDL 37 (L) 03/23/2024   LDLCALC 88 03/23/2024   TRIG 246 (H) 03/23/2024   CHOLHDL 4.3 03/23/2024   Lab Results  Component Value Date   HGBA1C 6.0 (H) 03/23/2024    Lab Results  Component Value Date   TSH 1.83 03/23/2024    Results for orders placed  or performed in visit on 03/25/24  POCT urinalysis dipstick  Result Value Ref Range   Color, UA yellow yellow   Clarity, UA cloudy (A) clear   Glucose, UA negative negative mg/dL   Bilirubin, UA negative negative   Ketones, POC UA negative negative mg/dL   Spec Grav, UA 1.610 9.604 - 1.025   Blood, UA small (A) negative   pH, UA 7.5 5.0 - 8.0   Protein Ur, POC =30 (A) negative mg/dL   Urobilinogen, UA 0.2 0.2 or 1.0 E.U./dL   Nitrite, UA Negative Negative   Leukocytes, UA Moderate (2+) (A) Negative   Assessment & Plan:   Orders Placed This Encounter  Procedures   Urine Culture   DG Bone Density   POCT urinalysis dipstick  Other  Labs Reviewed today: CBC: WNL CMP, compared to 02/2023: BUN 35, decreased from 53; Creatinine 2.65, elevated from 2.37; otherwise WNL.   TSH: 1.83  Leukocytes in Urine; Proteinuria; Hematuria: UA abnormal today, sending for culture.   Hypertension treated with Carvedilol 25 mg twice daily and Nitroglycerin as needed for chest pain. Dependent Edema treated with Lasix 20 mg. Blood Pressure: normotensive today at 132/80.   Hyperlipidemia treated with Atorvastatin 40 mg nightly, Zetia 10 mg nightly, and Lovaza 1 g twice daily. 03/23/2024 Lipid Panel, compared to 02/2023: HDL 37, decreased from 47; Triglycerides 246, elevated from 159.   Diabetes Mellitus, type II treated with Lantus 34 units injected daily, Semaglutide 2 mg injected weekly. Followed by Larene Pickett, PA at [facility]. 03/23/2024 HgbA1c, compared to 02/2023: 6.0, decreased from 7.3. Diabetic eye exam 10/01/2023 did not find any evidence of diabetic retinopathy with repeat recommended 2025.    Chronic Kidney Disease Stage 4. Followed by [facility], who she last saw on 3/7, and Terrial Rhodes w/ Nephrology. 03/23/2024 Kidney Functions, compared to 02/2023: BUN 35, decreased from 53; Creatinine 2.65, elevated from 2.37. Renal US in July 2022 did not show obstruction. She says that regarding kidney transplant candidacy, she is at the top of the list but currently inactive. Apparently her creatinine and GFR have been fluctuating, which may be alarming her physicians at [facility].   Hx of Gout followed by [facility], where she was last seen saw 01/2024, and is treated with Uloric 40 mg daily.   Followed by Podiatry for Onychomycosis.  Obstructive Sleep Apnea managed with CPAP. Followed by Dr. Craige Cotta, Pulmonologist, who she should f/u with in 04/2024 according to their last note.    PAP Smear 03/08/19 negative for intraepithelial lesions or malignancy. Repeated per patient request.   Mammogram 02/23/2024 w/o  mammographic evidence of malignancy and recommended repeat in 2026.   Colonoscopy 07/11/2023 removed one 3 mm sessile Polyp from the transverse colon (colonic mucosa with benign lymphoid aggregate per pathology); found a few Medium & Small-mouthed Diverticula in the sigmoid colon.    Bone Density overdue since 2024. Last completed 09/03/2022 w/ T-score Femur Neck Left -1.3. osteopenic. Ordered today.   Osteopenia but has not wanted to be on therapy; Dr. Eliott Nine had told her she could take Prolia but thus far patient has declined.   Vaccine Counseling: Due for Covid-19 and Tdap- obtain at pharamcy; UTD on Flu, Shingles 2/2, and PNA.   RTC in one year or as needed   Annual wellness visit done today including the all of the following: Reviewed patient's Family Medical History Reviewed and updated list of patient's medical providers Assessment of cognitive impairment was done Assessed patient's functional ability  Established a written schedule for health screening services Health Risk Assessent Completed and Reviewed  Discussed health benefits of physical activity, and encouraged her to engage in regular exercise appropriate for her age and condition.    I,Emily Lagle,acting as a Neurosurgeon for Margaree Mackintosh, MD.,have documented all relevant documentation on the behalf of Margaree Mackintosh, MD,as directed by  Margaree Mackintosh, MD while in the presence of Margaree Mackintosh, MD.   I, Margaree Mackintosh, MD, have reviewed all documentation for this visit. The documentation on 03/27/24 for the exam, diagnosis, procedures, and orders are all accurate and complete.

## 2024-03-26 LAB — URINE CULTURE
MICRO NUMBER:: 16255893
SPECIMEN QUALITY:: ADEQUATE

## 2024-03-29 LAB — CYTOLOGY - PAP
Comment: NEGATIVE
Diagnosis: NEGATIVE
High risk HPV: NEGATIVE

## 2024-03-30 ENCOUNTER — Telehealth: Payer: Self-pay

## 2024-03-30 NOTE — Telephone Encounter (Signed)
 Called patient left message to call office. Need to let know pap was WNL.

## 2024-03-30 NOTE — Telephone Encounter (Signed)
Patient informed will call if any questions.  

## 2024-04-05 ENCOUNTER — Encounter: Payer: Self-pay | Admitting: Podiatry

## 2024-04-05 ENCOUNTER — Ambulatory Visit (INDEPENDENT_AMBULATORY_CARE_PROVIDER_SITE_OTHER): Payer: Medicare PPO | Admitting: Podiatry

## 2024-04-05 DIAGNOSIS — M79674 Pain in right toe(s): Secondary | ICD-10-CM | POA: Diagnosis not present

## 2024-04-05 DIAGNOSIS — M79675 Pain in left toe(s): Secondary | ICD-10-CM

## 2024-04-05 DIAGNOSIS — E1142 Type 2 diabetes mellitus with diabetic polyneuropathy: Secondary | ICD-10-CM

## 2024-04-05 DIAGNOSIS — B351 Tinea unguium: Secondary | ICD-10-CM

## 2024-04-06 ENCOUNTER — Other Ambulatory Visit: Payer: Self-pay | Admitting: Internal Medicine

## 2024-04-08 ENCOUNTER — Other Ambulatory Visit (HOSPITAL_BASED_OUTPATIENT_CLINIC_OR_DEPARTMENT_OTHER): Payer: Self-pay

## 2024-04-08 MED ORDER — ACCU-CHEK SOFTCLIX LANCETS MISC
1.0000 | Freq: Two times a day (BID) | 99 refills | Status: DC
  Filled 2024-04-08: qty 100, 50d supply, fill #0

## 2024-04-10 NOTE — Progress Notes (Signed)
 Subjective:  Patient ID: [name], female    DOB: 26-Oct-1951,  MRN: [id number]  [name] presents to clinic today for at risk foot care with history of diabetic neuropathy and painful, elongated thickened toenails x 10 which are symptomatic when wearing enclosed shoe gear. This interferes with his/her daily activities.  Chief Complaint  Patient presents with   Diabetes    "Do my regular check." Debbie Mote, NP - saw 3 weeks ago; A1c - 5.8   New problem(s): None.   PCP is [name], Debbie Meyers, [name].  Allergies  Allergen Reactions   Iodinated Contrast Media Other (See Comments)    Kidney   Metformin     Other reaction(s): Other (See Comments), Unknown "Facial puffiness"  Pt stated her kidney specialist told her to add metformin to her drug allergies list.    Metformin And Related Other (See Comments)    KIDNEY FAILURE?   Ciprocin-Fluocin-Procin [Fluocinolone]     Acute kidney failure   Ciprofloxacin Other (See Comments)    Kidney failure   Fenofibrate     Other reaction(s): fever   Insulin Degludec Other (See Comments)    Headache   Insulins Other (See Comments)    Migraines, nausea Other reaction(s): Other (See Comments), Unknown Migraines, nausea NOVLOG    Niacin     Other reaction(s): Unknown   Orange Fruit [Citrus] Other (See Comments)    Orange juice   Ramipril     Other reaction(s): cough   Sulfamethoxazole-Trimethoprim     Other reaction(s): Unknown   Insulin Aspart (Human Analog) Rash   Sulfa Antibiotics Rash    Review of Systems: Negative except as noted in the HPI.  Objective: No changes noted in today's physical examination. There were no vitals filed for this visit. [name] is a pleasant 73 y.o. female in NAD. AAO x 3.  Vascular Examination: Capillary refill time immediate b/l. Vascular status intact b/l with palpable pedal pulses. Pedal hair present b/l. No pain with calf compression b/l. Skin temperature  gradient WNL b/l. No cyanosis or clubbing b/l. No ischemia or gangrene noted b/l.   Neurological Examination: Sensation grossly intact b/l with 10 gram monofilament. Vibratory sensation intact b/l. Pt has subjective symptoms of neuropathy.  Dermatological Examination: Pedal skin with normal turgor, texture and tone b/l.  No open wounds. No interdigital macerations.   Toenails 1-5 b/l thick, discolored, elongated with subungual debris and pain on dorsal palpation.   No corns, calluses nor porokeratotic lesions noted.  Musculoskeletal Examination: Normal muscle strength 5/5 to all lower extremity muscle groups bilaterally. HAV with bunion deformity noted b/l LE.[name] No pain, crepitus or joint limitation noted with ROM b/l LE.  Patient ambulates independently without assistive aids.  Radiographs: None  Last A1c:      Latest Ref Rng & Units 03/23/2024    9:14 AM  Hemoglobin A1C  Hemoglobin-A1c <5.7 % of total Hgb 6.0    Assessment/Plan: 1. Pain due to onychomycosis of toenails of both feet   2. Diabetic peripheral neuropathy associated with type 2 diabetes mellitus (HCC)    Patient was evaluated and treated. All patient's and/or POA's questions/concerns addressed on today's visit. Mycotic toenails 1-5 debrided in length and girth without incident.  Continue daily foot inspections and monitor blood glucose per PCP/Endocrinologist's recommendations.Continue soft, supportive shoe gear daily. Report any pedal injuries to medical professional. Call office if there are any quesitons/concerns. -Patient/POA to call should there be question/concern in the interim.   Return  in about 3 months (around 07/05/2024).  [name], DPM      [place] LOCATION: [address] [place] [address]                   Office [phone]   [facility] LOCATION: [address] [place], [place] [address] Office [phone]

## 2024-04-11 ENCOUNTER — Other Ambulatory Visit: Payer: Self-pay | Admitting: Internal Medicine

## 2024-04-12 ENCOUNTER — Other Ambulatory Visit (HOSPITAL_BASED_OUTPATIENT_CLINIC_OR_DEPARTMENT_OTHER): Payer: Self-pay

## 2024-04-12 MED ORDER — ATORVASTATIN CALCIUM 40 MG PO TABS
40.0000 mg | ORAL_TABLET | Freq: Every day | ORAL | 3 refills | Status: DC
  Filled 2024-04-12: qty 90, 90d supply, fill #0

## 2024-04-13 ENCOUNTER — Other Ambulatory Visit (HOSPITAL_BASED_OUTPATIENT_CLINIC_OR_DEPARTMENT_OTHER): Payer: Self-pay

## 2024-04-13 DIAGNOSIS — G4733 Obstructive sleep apnea (adult) (pediatric): Secondary | ICD-10-CM | POA: Diagnosis not present

## 2024-04-13 MED ORDER — CARVEDILOL 25 MG PO TABS
25.0000 mg | ORAL_TABLET | Freq: Two times a day (BID) | ORAL | 3 refills | Status: AC
  Filled 2024-04-13: qty 180, 90d supply, fill #0
  Filled 2024-07-11: qty 180, 90d supply, fill #1
  Filled 2024-10-07: qty 180, 90d supply, fill #2
  Filled 2025-01-05: qty 180, 90d supply, fill #3

## 2024-04-13 MED ORDER — EZETIMIBE 10 MG PO TABS
10.0000 mg | ORAL_TABLET | Freq: Every day | ORAL | 3 refills | Status: AC
  Filled 2024-04-13 – 2024-04-17 (×4): qty 90, 90d supply, fill #0
  Filled 2024-07-24: qty 90, 90d supply, fill #1
  Filled 2024-10-20: qty 90, 90d supply, fill #2
  Filled 2025-01-18: qty 90, 90d supply, fill #3

## 2024-04-13 MED ORDER — OMEGA-3-ACID ETHYL ESTERS 1 G PO CAPS
1.0000 g | ORAL_CAPSULE | Freq: Two times a day (BID) | ORAL | 3 refills | Status: AC
  Filled 2024-04-13 – 2024-04-29 (×2): qty 180, 90d supply, fill #0
  Filled 2024-07-24: qty 180, 90d supply, fill #1
  Filled 2024-10-21: qty 180, 90d supply, fill #2

## 2024-04-14 ENCOUNTER — Other Ambulatory Visit: Payer: Self-pay

## 2024-04-15 ENCOUNTER — Other Ambulatory Visit (HOSPITAL_BASED_OUTPATIENT_CLINIC_OR_DEPARTMENT_OTHER): Payer: Self-pay

## 2024-04-15 MED ORDER — ATORVASTATIN CALCIUM 40 MG PO TABS
40.0000 mg | ORAL_TABLET | Freq: Every day | ORAL | 3 refills | Status: AC
  Filled 2024-04-15: qty 90, 90d supply, fill #0
  Filled 2024-07-11: qty 90, 90d supply, fill #1
  Filled 2024-10-07: qty 90, 90d supply, fill #2
  Filled 2025-01-05: qty 90, 90d supply, fill #3

## 2024-04-17 ENCOUNTER — Other Ambulatory Visit (HOSPITAL_BASED_OUTPATIENT_CLINIC_OR_DEPARTMENT_OTHER): Payer: Self-pay

## 2024-04-19 ENCOUNTER — Other Ambulatory Visit: Payer: Self-pay

## 2024-04-21 ENCOUNTER — Other Ambulatory Visit (HOSPITAL_BASED_OUTPATIENT_CLINIC_OR_DEPARTMENT_OTHER): Payer: Self-pay

## 2024-04-21 MED ORDER — BOOSTRIX 5-2.5-18.5 LF-MCG/0.5 IM SUSY
PREFILLED_SYRINGE | INTRAMUSCULAR | 0 refills | Status: AC
  Filled 2024-04-21: qty 0.5, 1d supply, fill #0

## 2024-04-29 ENCOUNTER — Other Ambulatory Visit (HOSPITAL_BASED_OUTPATIENT_CLINIC_OR_DEPARTMENT_OTHER): Payer: Self-pay

## 2024-05-03 ENCOUNTER — Other Ambulatory Visit (HOSPITAL_BASED_OUTPATIENT_CLINIC_OR_DEPARTMENT_OTHER): Payer: Self-pay

## 2024-05-11 ENCOUNTER — Other Ambulatory Visit (HOSPITAL_BASED_OUTPATIENT_CLINIC_OR_DEPARTMENT_OTHER): Payer: Self-pay

## 2024-05-11 DIAGNOSIS — D225 Melanocytic nevi of trunk: Secondary | ICD-10-CM | POA: Diagnosis not present

## 2024-05-11 DIAGNOSIS — Z808 Family history of malignant neoplasm of other organs or systems: Secondary | ICD-10-CM | POA: Diagnosis not present

## 2024-05-11 DIAGNOSIS — D485 Neoplasm of uncertain behavior of skin: Secondary | ICD-10-CM | POA: Diagnosis not present

## 2024-05-11 DIAGNOSIS — L821 Other seborrheic keratosis: Secondary | ICD-10-CM | POA: Diagnosis not present

## 2024-05-11 DIAGNOSIS — L814 Other melanin hyperpigmentation: Secondary | ICD-10-CM | POA: Diagnosis not present

## 2024-05-11 DIAGNOSIS — L304 Erythema intertrigo: Secondary | ICD-10-CM | POA: Diagnosis not present

## 2024-05-11 DIAGNOSIS — Z85820 Personal history of malignant melanoma of skin: Secondary | ICD-10-CM | POA: Diagnosis not present

## 2024-05-11 DIAGNOSIS — L578 Other skin changes due to chronic exposure to nonionizing radiation: Secondary | ICD-10-CM | POA: Diagnosis not present

## 2024-05-11 DIAGNOSIS — D235 Other benign neoplasm of skin of trunk: Secondary | ICD-10-CM | POA: Diagnosis not present

## 2024-05-11 MED ORDER — NYSTATIN 100000 UNIT/GM EX CREA
1.0000 | TOPICAL_CREAM | Freq: Two times a day (BID) | CUTANEOUS | 2 refills | Status: AC
  Filled 2024-05-11: qty 60, 14d supply, fill #0

## 2024-05-12 ENCOUNTER — Other Ambulatory Visit (HOSPITAL_BASED_OUTPATIENT_CLINIC_OR_DEPARTMENT_OTHER): Payer: Self-pay

## 2024-05-17 ENCOUNTER — Other Ambulatory Visit (HOSPITAL_BASED_OUTPATIENT_CLINIC_OR_DEPARTMENT_OTHER): Payer: Self-pay

## 2024-05-17 MED ORDER — DEXCOM G7 SENSOR MISC
3 refills | Status: DC
  Filled 2024-05-17: qty 9, 90d supply, fill #0
  Filled 2024-08-11 (×2): qty 9, 90d supply, fill #1
  Filled 2024-11-09: qty 9, 90d supply, fill #2

## 2024-05-25 ENCOUNTER — Ambulatory Visit: Admitting: Internal Medicine

## 2024-05-25 ENCOUNTER — Ambulatory Visit: Payer: Self-pay

## 2024-05-25 ENCOUNTER — Other Ambulatory Visit (HOSPITAL_BASED_OUTPATIENT_CLINIC_OR_DEPARTMENT_OTHER): Payer: Self-pay

## 2024-05-25 ENCOUNTER — Encounter: Payer: Self-pay | Admitting: Internal Medicine

## 2024-05-25 ENCOUNTER — Other Ambulatory Visit (HOSPITAL_COMMUNITY): Payer: Self-pay

## 2024-05-25 VITALS — BP 120/80 | HR 88 | Ht 64.0 in | Wt 200.0 lb

## 2024-05-25 DIAGNOSIS — I10 Essential (primary) hypertension: Secondary | ICD-10-CM | POA: Diagnosis not present

## 2024-05-25 DIAGNOSIS — W57XXXA Bitten or stung by nonvenomous insect and other nonvenomous arthropods, initial encounter: Secondary | ICD-10-CM | POA: Diagnosis not present

## 2024-05-25 DIAGNOSIS — N184 Chronic kidney disease, stage 4 (severe): Secondary | ICD-10-CM

## 2024-05-25 DIAGNOSIS — S30861A Insect bite (nonvenomous) of abdominal wall, initial encounter: Secondary | ICD-10-CM | POA: Diagnosis not present

## 2024-05-25 MED ORDER — ACCU-CHEK SOFTCLIX LANCETS MISC
1.0000 | Freq: Three times a day (TID) | 11 refills | Status: AC
  Filled 2024-05-25: qty 100, 34d supply, fill #0
  Filled 2024-07-11: qty 100, 34d supply, fill #1
  Filled 2024-08-11: qty 100, 34d supply, fill #2
  Filled 2024-09-14: qty 100, 34d supply, fill #3
  Filled 2024-10-20: qty 100, 34d supply, fill #4
  Filled 2024-12-04: qty 100, 34d supply, fill #5
  Filled 2025-01-05 – 2025-01-25 (×2): qty 100, 34d supply, fill #6

## 2024-05-25 MED ORDER — DOXYCYCLINE HYCLATE 100 MG PO TABS
100.0000 mg | ORAL_TABLET | Freq: Two times a day (BID) | ORAL | 0 refills | Status: DC
  Filled 2024-05-25: qty 10, 5d supply, fill #0

## 2024-05-25 MED ORDER — ACCU-CHEK GUIDE TEST VI STRP
1.0000 | ORAL_STRIP | Freq: Three times a day (TID) | 11 refills | Status: AC
  Filled 2024-05-25: qty 100, 34d supply, fill #0
  Filled 2024-07-11: qty 100, 34d supply, fill #1
  Filled 2024-08-11: qty 100, 34d supply, fill #2
  Filled 2024-09-14: qty 100, 34d supply, fill #3
  Filled 2024-10-20: qty 100, 34d supply, fill #4
  Filled 2024-12-04: qty 100, 34d supply, fill #5
  Filled 2025-01-05 – 2025-01-25 (×2): qty 100, 34d supply, fill #6

## 2024-05-25 MED ORDER — INSULIN PEN NEEDLE 32G X 4 MM MISC
1.0000 | Freq: Every day | 4 refills | Status: AC
  Filled 2024-05-25: qty 100, 100d supply, fill #0
  Filled 2024-09-11: qty 100, 100d supply, fill #1
  Filled 2024-12-19: qty 100, 100d supply, fill #2

## 2024-05-25 NOTE — Progress Notes (Signed)
 Patient Care Team: Sylvan Evener, MD as PCP - General (Internal Medicine) Alanson Alliance, MD as Consulting Physician (Rheumatology) [facility]  Visit Date: 05/25/24  Subjective:   Chief Complaint  Patient presents with   Insect Bite    Tick bite, pulled off tick on 5/20.    Rash  Patient ZO:XWRUEAVW [name],Female DOB:August 16, 1951,72 y.o. UJW:[id number]   73 y.o.Female presents today for acute sick visit with Tick Bite; Rash. Patient has a past medical history of CKD. Says that she discovered it on 5/20 while taking a shower, which she does daily so doesn't believe it had been attached for more than 24-48 hours. Denies headache or fever/chills, but does have a rash in the area tick was attached and some itching, but not more than usual due to her CKD.   Past Medical History:  Diagnosis Date   Allergic rhinitis    Carbuncle, trunk    Carpal tunnel syndrome    Chronic kidney disease    Combined hyperlipidemia associated with type 2 diabetes mellitus (HCC)    Complication of anesthesia    Cyclical vomiting    Dependent edema    Diabetes mellitus type II    Diabetic autonomic neuropathy (HCC)    Diabetic peripheral neuropathy associated with type 2 diabetes mellitus (HCC)    Dyspepsia    Essential hypertension, benign    GERD (gastroesophageal reflux disease)    Goiter    Hypercholesteremia    Menopause    Obesity    OSA (obstructive sleep apnea) 09/23/2016   Osteopenia    PONV (postoperative nausea and vomiting)    Tachycardia    Type II or unspecified type diabetes mellitus without mention of complication, not stated as uncontrolled    Allergies  Allergen Reactions   Iodinated Contrast Media Other (See Comments)    Kidney   Metformin     Other reaction(s): Other (See Comments), Unknown "Facial puffiness"  Pt stated her kidney specialist told her to add metformin to her drug allergies list.    Metformin And Related Other (See  Comments)    KIDNEY FAILURE?   Ciprocin-Fluocin-Procin [Fluocinolone]     Acute kidney failure   Ciprofloxacin Other (See Comments)    Kidney failure   Fenofibrate     Other reaction(s): fever   Insulin  Degludec Other (See Comments)    Headache   Insulins Other (See Comments)    Migraines, nausea Other reaction(s): Other (See Comments), Unknown Migraines, nausea NOVLOG    Niacin     Other reaction(s): Unknown   Orange Fruit [Citrus] Other (See Comments)    Orange juice   Ramipril     Other reaction(s): cough   Sulfamethoxazole-Trimethoprim     Other reaction(s): Unknown   Insulin  Aspart (Human Analog) Rash   Sulfa Antibiotics Rash   Family History  Problem Relation Age of Onset   COPD Mother    Hypertension Mother    Thyroid  disease Mother    Heart disease Father    Hyperlipidemia Father    Diabetes Father    Heart disease Sister    Cancer Maternal Grandfather    Diabetes Paternal Grandmother    Breast cancer Neg Hx    Social History   Social History Narrative   Social history: She is married.  1 adult daughter who is a Diplomatic Services operational officer in [place].  She does not smoke.  Occasional wine consumption.  Formerly worked for [employer] but has retired.  Family history: Father with history of aortic valve replacement, hyperlipidemia, rheumatic heart disease.  Mother with history of hypertension, hypothyroidism, hyperlipidemia, COPD.  1 sister died in 06-19-2004 with coronary artery disease and hyperlipidemia.  1 sister living with hyperlipidemia.  Review of Systems  Constitutional:  Negative for chills and fever.  Skin:  Positive for itching (some, though no more than usualy with her CKD) and rash (stomach where tick was).  Neurological:  Negative for headaches.   Objective:  Vitals: BP 120/80   Pulse 88   Ht 5\' 4"  (1.626 m)   Wt 200 lb (90.7 kg)   SpO2 98%   BMI 34.33 kg/m   Physical Exam Vitals and nursing note reviewed.   Constitutional:      General: She is not in acute distress.    Appearance: Normal appearance. She is not toxic-appearing.  HENT:     Head: Normocephalic and atraumatic.  Pulmonary:     Effort: Pulmonary effort is normal.  Abdominal:     Comments: Area of redness on lower abdomen where tick was attached/removed.   Skin:    General: Skin is warm and dry.  Neurological:     Mental Status: She is alert and oriented to person, place, and time. Mental status is at baseline.  Psychiatric:        Mood and Affect: Mood normal.        Behavior: Behavior normal.        Thought Content: Thought content normal.        Judgment: Judgment normal.     Results:  Studies Obtained And Personally Reviewed By Me: Labs:     Component Value Date/Time   NA 144 03/23/2024 0914   K 5.1 03/23/2024 0914   CL 107 03/23/2024 0914   CO2 29 03/23/2024 0914   GLUCOSE 82 03/23/2024 0914   BUN 35 (H) 03/23/2024 0914   CREATININE 2.65 (H) 03/23/2024 0914   CALCIUM  10.0 03/23/2024 0914   CALCIUM  8.3 (L) 07/17/2008 1225   PROT 6.9 03/23/2024 0914   ALBUMIN 3.8 02/23/2015 1004   AST 22 03/23/2024 0914   ALT 17 03/23/2024 0914   ALKPHOS 82 02/23/2015 1004   BILITOT 0.5 03/23/2024 0914   GFRNONAA 23 (L) 10/06/2021 0048   GFRNONAA 23 (L) 03/09/2021 1123   GFRAA 26 (L) 03/09/2021 1123    Lab Results  Component Value Date   WBC 6.1 03/23/2024   HGB 14.3 03/23/2024   HCT 44.1 03/23/2024   MCV 94.0 03/23/2024   PLT 298 03/23/2024   Lab Results  Component Value Date   CHOL 159 03/23/2024   HDL 37 (L) 03/23/2024   LDLCALC 88 03/23/2024   TRIG 246 (H) 03/23/2024   CHOLHDL 4.3 03/23/2024   Lab Results  Component Value Date   HGBA1C 6.0 (H) 03/23/2024    Lab Results  Component Value Date   TSH 1.83 03/23/2024   Assessment & Plan:   Tick Bite; Rash: discovered tick on 5/20 while taking a shower, which she does daily so doesn't believe it had been attached for more than 24-48 hours. Denies  headache or fever/chills, but does have a rash in the area tick was attached and some itching, but not more than usual due to her CKD. Sending in 100 mg Doxycycline  - take 1 tablet (100 mg total) by mouth 2 (two) times daily.  Chronic Kidney Disease followed by [facility]  Hypertension treated with Carvedilol  25 mg twice daily and Nitroglycerin  as needed.  Blood pressure normo-tensive today at 120/80.     I,[name],acting as a Neurosurgeon for Sylvan Evener, MD.,have documented all relevant documentation on the behalf of Sylvan Evener, MD,as directed by  Sylvan Evener, MD while in the presence of Sylvan Evener, MD.   ***

## 2024-05-25 NOTE — Telephone Encounter (Signed)
  Chief Complaint: tick bite Symptoms: red ring rash Frequency: 1 week ago Pertinent Negatives: Patient denies fever Disposition: [] ED /[] Urgent Care (no appt availability in office) / [x] Appointment(In office/virtual)/ []  [facility]/ [] Home Care/ [] Refused Recommended Disposition /[]  Mobile Bus/ []  Follow-up with PCP Additional Notes: Pt c/o red circular rash noted on R side of abd near umbilicus s/p tick bite 1 week ago. Pt denies any sx or fevers. Pt is unsure how long tick was attached, but assuming it was < 24 hrs. Pt does not know if tick was engorged or type of tick. Of note, pt endorses CKD. Scheduled patient per protocol on May 25, 2024. Patient verbalized understanding and to call back with worsening symptoms.        Copied from CRM [id number]. Topic: Clinical - Red Word Triage >> May 25, 2024  9:15 AM Alethia Huxley E wrote: [facility] that prompted transfer to Nurse Triage: Tick bite. Patient got a tick bite on the 20th and now there is a red spot on her stomach where the bite was. Reason for Disposition  Red ring or bull's-eye rash occurs at tick bite  Answer Assessment - Initial Assessment Questions 1. ATTACHED:  "Is the tick still on the skin?"  (e.g., yes, no, unsure)     no 2. ONSET - TICK STILL ATTACHED:  "How long do you think the tick has been on your skin?" (e.g., hours, days, unsure)  Note:  Is there a recent activity (camping, hiking) where the caller may have been exposed?     Pt assumes less than 24 hrs, pt has tick, and reports "so tiny" so unknown if tick engorged Noticed on 20-removed same day Red spot appeared 2 days later 3. ONSET - TICK NOT STILL ATTACHED: "If the tick has been removed, how long do you think the tick was attached before you removed it?" (e.g., 5 hours, 2 days). "When was this?"     Thinks < 24 hrs 4. LOCATION: "Where is the tick bite located?" (e.g., arm, leg)     R side abdomen, near umbilicus 5. TYPE of TICK: "Is it a  wood tick or a deer tick?" (e.g., deer tick, wood tick; unsure)     unsure 6. SIZE of TICK: "How big is the tick?" (e.g., size of poppy seed, apple seed, watermelon seed; unsure) Note: Deer ticks can be the size of a poppy seed (nymph) or an apple seed (adult).       Size end of ball point pen 7. ENGORGED: "Did the tick look flat or engorged (full, swollen)?" (e.g., flat, engorged; unsure)     unsure 8. OTHER SYMPTOMS: "Do you have any other symptoms?" (e.g., fever, rash, redness at bite area, red ring around bite)     Endorses red round ring - reports lighter in the center Size of quarter 9. PREGNANCY: "Is there any chance you are pregnant?" "When was your last menstrual period?"     N/a  Protocols used: Tick Bite-A-AH

## 2024-05-26 ENCOUNTER — Other Ambulatory Visit (HOSPITAL_BASED_OUTPATIENT_CLINIC_OR_DEPARTMENT_OTHER): Payer: Self-pay

## 2024-05-27 NOTE — Patient Instructions (Signed)
 Unlikely to have RMSF with no symptoms and duration  Since bite. Will cover with Doxycycline  100 mg twice daily x 5 days.

## 2024-06-01 ENCOUNTER — Telehealth: Admitting: Internal Medicine

## 2024-06-01 ENCOUNTER — Encounter: Payer: Self-pay | Admitting: Internal Medicine

## 2024-06-01 VITALS — Ht 64.0 in | Wt 200.0 lb

## 2024-06-01 DIAGNOSIS — H1033 Unspecified acute conjunctivitis, bilateral: Secondary | ICD-10-CM

## 2024-06-01 DIAGNOSIS — N184 Chronic kidney disease, stage 4 (severe): Secondary | ICD-10-CM

## 2024-06-01 DIAGNOSIS — E119 Type 2 diabetes mellitus without complications: Secondary | ICD-10-CM

## 2024-06-01 MED ORDER — TOBRAMYCIN 0.3 % OP SOLN: 2.0000 [drp] | Freq: Four times a day (QID) | OPHTHALMIC | 0 refills | Status: AC

## 2024-06-01 NOTE — Patient Instructions (Addendum)
 Patient seen virtually as husband had surgery in [place] and she is there with her husband and daughter. Have sent in Tobrex ophthalmic solution to apply 2 drops both eyes 4 times a day for 5 days. May use warm compresses.

## 2024-06-01 NOTE — Progress Notes (Addendum)
 Patient Care Team: Sylvan Evener, MD as PCP - General (Internal Medicine) Alanson Alliance, MD as Consulting Physician (Rheumatology) [facility]  I connected with Aileen Householder Vertz on 06/01/24 at 4:31 PM by video enabled telemedicine visit and verified that I am speaking with the correct person using two identifiers, myself and Roxanne Copping, CMA. I am in my office and patient is in their home.    I discussed the limitations, risks, security and privacy concerns of performing an evaluation and management service by telemedicine and the availability of in-person appointments. I also discussed with the patient that there may be a patient responsible charge related to this service. The patient expressed understanding and agreed to proceed.   Other persons participating in the visit and their role in the encounter: Medical scribe, Claudetta Cuba  Patient's location: Home  Provider's location: Clinic   I provided 20 minutes of time spent during this encounter, including chart review, interviewing patient, medical decision making, and e-scribing medication with > 50% was spent counseling as documented under my assessment & plan.   Chief Complaint  Patient presents with   Conjunctivitis   Subjective:  Patient ZO:XWRUEAVW Neelley Budlong,Female DOB:07-27-1951,72 y.o. MRN:[id number]   72 y.o. Female presents today for acute visit with Eye Irritation. Patient has a past medical history of Allergic Rhinitis. Says that she noticed drainage in the corner of her eyes this morning, though may have been having some drainage a few days prior which she attributed to allergies.   Past Medical History:  Diagnosis Date   Allergic rhinitis    Carbuncle, trunk    Carpal tunnel syndrome    Chronic kidney disease    Combined hyperlipidemia associated with type 2 diabetes mellitus (HCC)    Complication of anesthesia    Cyclical vomiting    Dependent edema    Diabetes mellitus  type II    Diabetic autonomic neuropathy (HCC)    Diabetic peripheral neuropathy associated with type 2 diabetes mellitus (HCC)    Dyspepsia    Essential hypertension, benign    GERD (gastroesophageal reflux disease)    Goiter    Hypercholesteremia    Menopause    Obesity    OSA (obstructive sleep apnea) 09/23/2016   Osteopenia    PONV (postoperative nausea and vomiting)    Tachycardia    Type II or unspecified type diabetes mellitus without mention of complication, not stated as uncontrolled     Allergies  Allergen Reactions   Iodinated Contrast Media Other (See Comments)    Kidney   Metformin     Other reaction(s): Other (See Comments), Unknown "Facial puffiness"  Pt stated her kidney specialist told her to add metformin to her drug allergies list.    Metformin And Related Other (See Comments)    KIDNEY FAILURE?   Ciprocin-Fluocin-Procin [Fluocinolone]     Acute kidney failure   Ciprofloxacin Other (See Comments)    Kidney failure   Fenofibrate     Other reaction(s): fever   Insulin  Degludec Other (See Comments)    Headache   Insulins Other (See Comments)    Migraines, nausea Other reaction(s): Other (See Comments), Unknown Migraines, nausea NOVLOG    Niacin     Other reaction(s): Unknown   Orange Fruit [Citrus] Other (See Comments)    Orange juice   Ramipril     Other reaction(s): cough   Sulfamethoxazole-Trimethoprim     Other reaction(s): Unknown   Insulin  Aspart (Human Analog) Rash  Sulfa Antibiotics Rash   Family History  Problem Relation Age of Onset   COPD Mother    Hypertension Mother    Thyroid  disease Mother    Heart disease Father    Hyperlipidemia Father    Diabetes Father    Heart disease Sister    Cancer Maternal Grandfather    Diabetes Paternal Grandmother    Breast cancer Neg Hx    Social History   Social History Narrative   Social history: She is married.  1 adult daughter who is a Diplomatic Services operational officer in [place].  She does not  smoke.  Occasional wine consumption.  Formerly worked for [employer] but has retired.       Family history: Father with history of aortic valve replacement, hyperlipidemia, rheumatic heart disease.  Mother with history of hypertension, hypothyroidism, hyperlipidemia, COPD.  1 sister died in 2004/07/01 with coronary artery disease and hyperlipidemia.  1 sister living with hyperlipidemia.   Review of Systems  Eyes:  Positive for discharge and redness.     Objective:  Vitals: Ht 5\' 4"  (1.626 m)   Wt 200 lb (90.7 kg)   BMI 34.33 kg/m  Physical Exam Vitals and nursing note reviewed.  Constitutional:      General: She is not in acute distress.    Appearance: Normal appearance. She is not toxic-appearing.  HENT:     Head: Normocephalic and atraumatic.  Eyes:     Conjunctiva/sclera:     Right eye: Right conjunctiva is injected.  Pulmonary:     Effort: Pulmonary effort is normal.  Skin:    General: Skin is warm and dry.  Neurological:     Mental Status: She is alert and oriented to person, place, and time. Mental status is at baseline.  Psychiatric:        Mood and Affect: Mood normal.        Behavior: Behavior normal.        Thought Content: Thought content normal.        Judgment: Judgment normal.     Results:  Studies Obtained And Personally Reviewed By Me: Labs:     Component Value Date/Time   NA 144 03/23/2024 0914   K 5.1 03/23/2024 0914   CL 107 03/23/2024 0914   CO2 29 03/23/2024 0914   GLUCOSE 82 03/23/2024 0914   BUN 35 (H) 03/23/2024 0914   CREATININE 2.65 (H) 03/23/2024 0914   CALCIUM  10.0 03/23/2024 0914   CALCIUM  8.3 (L) 07/17/2008 1225   PROT 6.9 03/23/2024 0914   ALBUMIN 3.8 02/23/2015 1004   AST 22 03/23/2024 0914   ALT 17 03/23/2024 0914   ALKPHOS 82 02/23/2015 1004   BILITOT 0.5 03/23/2024 0914   GFRNONAA 23 (L) 10/06/2021 0048   GFRNONAA 23 (L) 03/09/2021 1123   GFRAA 26 (L) 03/09/2021 1123    Lab Results  Component Value Date    WBC 6.1 03/23/2024   HGB 14.3 03/23/2024   HCT 44.1 03/23/2024   MCV 94.0 03/23/2024   PLT 298 03/23/2024   Lab Results  Component Value Date   CHOL 159 03/23/2024   HDL 37 (L) 03/23/2024   LDLCALC 88 03/23/2024   TRIG 246 (H) 03/23/2024   CHOLHDL 4.3 03/23/2024   Lab Results  Component Value Date   HGBA1C 6.0 (H) 03/23/2024    Lab Results  Component Value Date   TSH 1.83 03/23/2024    Assessment & Plan:   Meds ordered this encounter  Medications  tobramycin (TOBREX) 0.3 % ophthalmic solution    Sig: Place 2 drops into both eyes 4 (four) times daily.    Dispense:  5 mL    Refill:  0   Bilateral Conjunctivitis: Noticed drainage this morning. Sending in Tobramycin ophthalmic solution to place 2 drops into both eyes 4 times daily.     I,Emily Lagle,acting as a Neurosurgeon for Sylvan Evener, MD.,have documented all relevant documentation on the behalf of Sylvan Evener, MD,as directed by  Sylvan Evener, MD while in the presence of Sylvan Evener, MD.   I, Sylvan Evener, MD, have reviewed all documentation for this visit. The documentation on 06/01/24 for the exam, diagnosis, procedures, and orders are all accurate and complete.

## 2024-06-21 DIAGNOSIS — N184 Chronic kidney disease, stage 4 (severe): Secondary | ICD-10-CM | POA: Diagnosis not present

## 2024-06-21 DIAGNOSIS — Z794 Long term (current) use of insulin: Secondary | ICD-10-CM | POA: Diagnosis not present

## 2024-06-21 DIAGNOSIS — E1122 Type 2 diabetes mellitus with diabetic chronic kidney disease: Secondary | ICD-10-CM | POA: Diagnosis not present

## 2024-06-21 DIAGNOSIS — I129 Hypertensive chronic kidney disease with stage 1 through stage 4 chronic kidney disease, or unspecified chronic kidney disease: Secondary | ICD-10-CM | POA: Diagnosis not present

## 2024-06-22 ENCOUNTER — Other Ambulatory Visit (HOSPITAL_BASED_OUTPATIENT_CLINIC_OR_DEPARTMENT_OTHER): Payer: Self-pay

## 2024-06-24 ENCOUNTER — Other Ambulatory Visit (HOSPITAL_COMMUNITY): Payer: Self-pay | Admitting: Internal Medicine

## 2024-06-24 ENCOUNTER — Other Ambulatory Visit (HOSPITAL_BASED_OUTPATIENT_CLINIC_OR_DEPARTMENT_OTHER): Payer: Self-pay

## 2024-06-24 DIAGNOSIS — N184 Chronic kidney disease, stage 4 (severe): Secondary | ICD-10-CM | POA: Diagnosis not present

## 2024-06-30 DIAGNOSIS — N2589 Other disorders resulting from impaired renal tubular function: Secondary | ICD-10-CM | POA: Diagnosis not present

## 2024-06-30 DIAGNOSIS — R809 Proteinuria, unspecified: Secondary | ICD-10-CM | POA: Diagnosis not present

## 2024-06-30 DIAGNOSIS — E1122 Type 2 diabetes mellitus with diabetic chronic kidney disease: Secondary | ICD-10-CM | POA: Diagnosis not present

## 2024-06-30 DIAGNOSIS — I129 Hypertensive chronic kidney disease with stage 1 through stage 4 chronic kidney disease, or unspecified chronic kidney disease: Secondary | ICD-10-CM | POA: Diagnosis not present

## 2024-06-30 DIAGNOSIS — N184 Chronic kidney disease, stage 4 (severe): Secondary | ICD-10-CM | POA: Diagnosis not present

## 2024-06-30 DIAGNOSIS — E785 Hyperlipidemia, unspecified: Secondary | ICD-10-CM | POA: Diagnosis not present

## 2024-06-30 DIAGNOSIS — E875 Hyperkalemia: Secondary | ICD-10-CM | POA: Diagnosis not present

## 2024-06-30 DIAGNOSIS — N179 Acute kidney failure, unspecified: Secondary | ICD-10-CM | POA: Diagnosis not present

## 2024-06-30 DIAGNOSIS — N2581 Secondary hyperparathyroidism of renal origin: Secondary | ICD-10-CM | POA: Diagnosis not present

## 2024-07-08 ENCOUNTER — Ambulatory Visit: Payer: Medicare PPO | Admitting: Podiatry

## 2024-07-12 ENCOUNTER — Other Ambulatory Visit: Payer: Self-pay

## 2024-07-12 ENCOUNTER — Ambulatory Visit: Admitting: Podiatry

## 2024-07-13 DIAGNOSIS — G4733 Obstructive sleep apnea (adult) (pediatric): Secondary | ICD-10-CM | POA: Diagnosis not present

## 2024-07-19 ENCOUNTER — Encounter: Payer: Self-pay | Admitting: Podiatry

## 2024-07-19 ENCOUNTER — Other Ambulatory Visit (HOSPITAL_BASED_OUTPATIENT_CLINIC_OR_DEPARTMENT_OTHER): Payer: Self-pay

## 2024-07-19 ENCOUNTER — Ambulatory Visit (INDEPENDENT_AMBULATORY_CARE_PROVIDER_SITE_OTHER): Admitting: Podiatry

## 2024-07-19 DIAGNOSIS — L600 Ingrowing nail: Secondary | ICD-10-CM | POA: Diagnosis not present

## 2024-07-19 DIAGNOSIS — M79674 Pain in right toe(s): Secondary | ICD-10-CM

## 2024-07-19 DIAGNOSIS — M1991 Primary osteoarthritis, unspecified site: Secondary | ICD-10-CM | POA: Insufficient documentation

## 2024-07-19 DIAGNOSIS — M2011 Hallux valgus (acquired), right foot: Secondary | ICD-10-CM | POA: Diagnosis not present

## 2024-07-19 DIAGNOSIS — M2012 Hallux valgus (acquired), left foot: Secondary | ICD-10-CM | POA: Diagnosis not present

## 2024-07-19 DIAGNOSIS — B351 Tinea unguium: Secondary | ICD-10-CM

## 2024-07-19 DIAGNOSIS — E1142 Type 2 diabetes mellitus with diabetic polyneuropathy: Secondary | ICD-10-CM | POA: Diagnosis not present

## 2024-07-19 DIAGNOSIS — E119 Type 2 diabetes mellitus without complications: Secondary | ICD-10-CM | POA: Diagnosis not present

## 2024-07-19 DIAGNOSIS — M79675 Pain in left toe(s): Secondary | ICD-10-CM

## 2024-07-19 MED ORDER — DOXYCYCLINE HYCLATE 100 MG PO CAPS
100.0000 mg | ORAL_CAPSULE | Freq: Two times a day (BID) | ORAL | 0 refills | Status: AC
  Filled 2024-07-19: qty 20, 10d supply, fill #0

## 2024-07-19 NOTE — Progress Notes (Signed)
 ANNUAL DIABETIC FOOT EXAM  Subjective: [name] presents today for annual diabetic foot exam. Patient states her right 4th digit is sore. She feels the nail may have cracked. Also has break in skin. She has had a salon pedicure since her last visit. Denies any drainage or swelling, but digit is tender to touch. She also has h/o ingrown toenails of both great toes.   She also relates occasional tenderness under toes of the left foot. Denies any trauma to area. Chief Complaint  Patient presents with   [facility]    Rm1 DFC/ diabetic/ Dr. Perri  last visit May 2025   Patient confirms h/o diabetes.  Patient denies any h/o foot wounds.  Patient has been diagnosed with neuropathy.  Perri Ronal PARAS, MD is patient's PCP.  Past Medical History:  Diagnosis Date   Allergic rhinitis    Carbuncle, trunk    Carpal tunnel syndrome    Chronic kidney disease    Combined hyperlipidemia associated with type 2 diabetes mellitus (HCC)    Complication of anesthesia    Cyclical vomiting    Dependent edema    Diabetes mellitus type II    Diabetic autonomic neuropathy (HCC)    Diabetic peripheral neuropathy associated with type 2 diabetes mellitus (HCC)    Dyspepsia    Essential hypertension, benign    GERD (gastroesophageal reflux disease)    Goiter    Hypercholesteremia    Menopause    Obesity    OSA (obstructive sleep apnea) 09/23/2016   Osteopenia    PONV (postoperative nausea and vomiting)    Tachycardia    Type II or unspecified type diabetes mellitus without mention of complication, not stated as uncontrolled    Patient Active Problem List   Diagnosis Date Noted   Primary osteoarthritis 07/19/2024   Family history of cancer 05/28/2022   Hemangioma of skin and subcutaneous tissue 05/28/2022   Lentigo 05/28/2022   Lipoma of upper arm 05/28/2022   Personal history of malignant melanoma of skin 05/28/2022   Rosacea 05/28/2022   Seborrheic keratosis 05/28/2022   Chronic  gouty arthritis 02/26/2022   Malignant melanoma of trunk (HCC) 02/26/2022   NSTEMI (non-ST elevated myocardial infarction) (HCC) 10/06/2021   Acute renal failure superimposed on stage 4 chronic kidney disease (HCC) 10/06/2021   SVT (supraventricular tachycardia) (HCC) 10/05/2021   Autoimmune thyroiditis 08/01/2021   Polyarthralgia 08/01/2021   Diabetes mellitus type 2, uncomplicated (HCC) 03/08/2019   OSA (obstructive sleep apnea) 09/23/2016   Pre-transplant evaluation for kidney transplant 03/15/2013   Hypoglycemia associated with diabetes (HCC) 03/10/2013   Gout 10/24/2012   Goiter    Obesity    Diabetic peripheral neuropathy associated with type 2 diabetes mellitus (HCC)    Diabetic autonomic neuropathy (HCC)    Tachycardia    Combined hyperlipidemia associated with type 2 diabetes mellitus (HCC)    Dyspepsia    Hyperlipidemia 06/03/2011   CKD (chronic kidney disease), stage IV (HCC) 06/03/2011   DIABETES MELLITUS, TYPE II 03/22/2008   Essential hypertension 03/22/2008   THYROIDITIS, HX OF 03/22/2008   Past Surgical History:  Procedure Laterality Date   CESAREAN SECTION  1983   COLONOSCOPY WITH PROPOFOL  N/A 07/09/2018   Procedure: COLONOSCOPY WITH PROPOFOL ;  Surgeon: Kristie Lamprey, MD;  Location: [facility];  Service: Endoscopy;  Laterality: N/A;   COLONOSCOPY WITH PROPOFOL  N/A 07/11/2023   Procedure: COLONOSCOPY WITH PROPOFOL ;  Surgeon: Rollin Dover, MD;  Location: [facility];  Service: Gastroenterology;  Laterality: N/A;   DILATION  AND CURETTAGE OF UTERUS  1988   GANGLION CYST EXCISION  1959   rt wrist   POLYPECTOMY  07/09/2018   Procedure: POLYPECTOMY;  Surgeon: Kristie Lamprey, MD;  Location: [facility];  Service: Endoscopy;;   POLYPECTOMY  07/11/2023   Procedure: POLYPECTOMY;  Surgeon: Rollin Dover, MD;  Location: [facility];  Service: Gastroenterology;;   Current Outpatient Medications on File Prior to Visit  Medication Sig Dispense Refill   ACCU-CHEK GUIDE TEST  test strip Use 1 test strip 3 times daily. 100 each 11   Accu-Chek Softclix Lancets lancets Use 1 each 3 times daily. 100 each 11   Ascorbic Acid (VITAMIN C) 500 MG CAPS Take 500 mg by mouth every morning. Super C     aspirin  81 MG chewable tablet Chew 81 mg by mouth daily.     atorvastatin  (LIPITOR) 40 MG tablet Take 1 tablet (40 mg total) by mouth at bedtime 90 tablet 3   carvedilol  (COREG ) 25 MG tablet Take 1 tablet (25 mg total) by mouth 2 (two) times daily 180 tablet 3   cholecalciferol (VITAMIN D ) 1000 units tablet Take 1,000 Units by mouth daily.     Continuous Glucose Sensor (DEXCOM G7 SENSOR) MISC Use for monitoring blood sugars daily. Change sensor every 10 days. 9 each 3   ezetimibe  (ZETIA ) 10 MG tablet Take 1 tablet (10 mg total) by mouth daily. 90 tablet 3   febuxostat  (ULORIC ) 40 MG tablet Take 1 tablet (40 mg total) by mouth daily. 90 tablet 3   furosemide  (LASIX ) 20 MG tablet Take 20 mg by mouth daily as needed for fluid or edema.     insulin  glargine (LANTUS ) 100 UNIT/ML Solostar Pen Inject 34 Units into the skin daily. 30 mL 3   Insulin  Pen Needle 32G X 4 MM MISC Use 1 each daily for insulin  administration. 100 each 4   loratadine (CLARITIN) 10 MG tablet Take 10 mg by mouth daily as needed for allergies.     Multiple Vitamin (MULTIVITAMIN) capsule Take 1 capsule by mouth daily.     nitroGLYCERIN  (NITROSTAT ) 0.4 MG SL tablet Place under the tongue every 5 (five) minutes as needed for chest pain.     nystatin  cream (MYCOSTATIN ) Apply 1 Application (pea size amount) topically 2 (two) times daily. 60 g 2   omega-3 acid ethyl esters (LOVAZA ) 1 g capsule Take 1 capsule (1 g total) by mouth 2 (two) times daily. 180 capsule 3   promethazine  (PHENERGAN ) 25 MG tablet Take 25 mg by mouth daily as needed for nausea.     Semaglutide , 2 MG/DOSE, (OZEMPIC , 2 MG/DOSE,) 8 MG/3ML SOPN Inject 2 mg into the skin once a week. 9 mL 3   sodium bicarbonate 650 MG tablet Take 1,300 mg by mouth 2 (two)  times daily.     sodium zirconium cyclosilicate  (LOKELMA ) 10 g PACK packet Take 10 g (1 packet total) by mouth every other day. 15 each 5   influenza vaccine adjuvanted (FLUAD ) 0.5 ML injection Inject into the muscle. 0.5 mL 0   Tdap (BOOSTRIX ) 5-2.5-18.5 LF-MCG/0.5 injection Inject into the muscle. 0.5 mL 0   tobramycin  (TOBREX ) 0.3 % ophthalmic solution Place 2 drops into both eyes 4 (four) times daily. 5 mL 0   No current facility-administered medications on file prior to visit.    Allergies  Allergen Reactions   Iodinated Contrast Media Other (See Comments)    Kidney   Metformin     Other reaction(s): Other (See Comments), Unknown  Facial puffiness  Pt stated her kidney specialist told her to add metformin to her drug allergies list.    Metformin And Related Other (See Comments)    KIDNEY FAILURE?   Ciprocin-Fluocin-Procin [Fluocinolone]     Acute kidney failure   Ciprofloxacin Other (See Comments)    Kidney failure   Fenofibrate     Other reaction(s): fever   Insulin  Degludec Other (See Comments)    Headache   Insulins Other (See Comments)    Migraines, nausea Other reaction(s): Other (See Comments), Unknown Migraines, nausea NOVLOG    Niacin     Other reaction(s): Unknown   Orange Fruit [Citrus] Other (See Comments)    Orange juice   Ramipril     Other reaction(s): cough   Sulfamethoxazole-Trimethoprim     Other reaction(s): Unknown   Insulin  Aspart (Human Analog) (Yeast) Rash   Sulfa Antibiotics Rash and Dermatitis   Social History   Occupational History   Occupation: retired  Tobacco Use   Smoking status: Never   Smokeless tobacco: Never  Vaping Use   Vaping status: Never Used  Substance and Sexual Activity   Alcohol use: No    Alcohol/week: 0.0 - 1.0 standard drinks of alcohol    Comment: rarely   Drug use: No   Sexual activity: Not on file   Family History  Problem Relation Age of Onset   COPD Mother    Hypertension Mother    Thyroid  disease  Mother    Heart disease Father    Hyperlipidemia Father    Diabetes Father    Heart disease Sister    Cancer Maternal Grandfather    Diabetes Paternal Grandmother    Breast cancer Neg Hx    Immunization History  Administered Date(s) Administered   Fluad  Quad(high Dose 65+) 09/21/2022   Fluad  Trivalent(High Dose 65+) 09/15/2023   Influenza Split 10/29/2012   Influenza,inj,Quad PF,6+ Mos 10/13/2013, 10/12/2014, 09/30/2015, 09/09/2016, 10/14/2017, 10/08/2018, 09/22/2019, 09/13/2020, 10/16/2021   Influenza-Unspecified 10/08/2013   PFIZER(Purple Top)SARS-COV-2 Vaccination 01/20/2020, 02/10/2020, 11/11/2020   Pfizer Covid-19 Vaccine Bivalent Booster 10yrs & up 12/06/2021   Pneumococcal Conjugate-13 03/14/2015, 03/08/2019   Pneumococcal Polysaccharide-23 04/29/2006, 03/30/2020   Respiratory Syncytial Virus Vaccine ,Recomb Aduvanted(Arexvy ) 10/13/2023   Tdap 01/31/2004, 02/17/2014, 04/21/2024   Zoster Recombinant(Shingrix) 05/12/2020, 07/21/2020     Review of Systems: Negative except as noted in the HPI.   Objective: There were no vitals filed for this visit.  [name] is a pleasant 73 y.o. female in NAD. AAO X 3.  Diabetic foot exam was performed with the following findings:   Vascular Examination: Capillary refill time immediate b/l. Palpable pedal pulses. Pedal hair present b/l. No pain with calf compression b/l. Skin temperature gradient WNL b/l. No cyanosis or clubbing b/l. No ischemia or gangrene noted b/l.   Neurological Examination: Sensation grossly intact b/l with 10 gram monofilament. Vibratory sensation intact b/l. Pt has subjective symptoms of neuropathy.  Dermatological Examination: Pedal skin with normal turgor, texture and tone b/l.  No open wounds. No interdigital macerations.   Toenails 1-5 left, 2, 3, 5 right elongated, discolored, dystrophic, thickened, and crumbly with subungual debris and tenderness to dorsal palpation.  Incurvated nailplate  lateral border of R 4th toe and both borders of right hallux.  Nail border hypertrophy present. There is tenderness to palpation. Sign(s) of infection: erythema and pain on palpation.  Musculoskeletal Examination: Muscle strength 5/5 to all lower extremity muscle groups bilaterally. HAV with bunion deformity noted b/l LE.SABRA No pain, crepitus or  joint limitation noted with ROM b/l LE.  Patient ambulates independently without assistive aids.  Radiographs: None      Lab Results  Component Value Date   HGBA1C 6.0 (H) 03/23/2024   ADA Risk Categorization: Low Risk :  Patient has all of the following: Intact protective sensation No prior foot ulcer  No severe deformity Pedal pulses present  Assessment: 1. Pain due to onychomycosis of toenails of both feet   2. Ingrown toenail of right foot   3. Hallux valgus, acquired, bilateral   4. Diabetic peripheral neuropathy associated with type 2 diabetes mellitus (HCC)   5. Encounter for diabetic foot exam (HCC)     Plan: Meds ordered this encounter  Medications   doxycycline  (VIBRAMYCIN ) 100 MG capsule    Sig: Take 1 capsule (100 mg total) by mouth 2 (two) times daily for 10 days.    Dispense:  20 capsule    Refill:  0   Diabetic foot examination performed today. All patient's and/or POA's questions/concerns addressed on today's visit. Toenails 1-5 left foot, R 2nd toe, R 3rd toe, and R 5th toe debrided in length and girth without incident. Monitor blood glucose per PCP/Endocrinologist's recommendations.Continue soft, supportive shoe gear daily. Report any pedal injuries to medical professional. Call office if there are any questions/concerns. -Advised patient to refrain from salon pedicures. -Patient to continue soft, supportive shoe gear daily. -Discussed chronicity of ingrown toenail(s) of right great toe and right fourth digit. Recommended patient consider having matrixectomy performed to alleviate chronic ingrown toenail(s). Discussed  in-office procedure and post-procedure instructions. Patient agrees and will be referred to Dr. Juliene Medicine for procedure. -Rx for Doxycyline 100 mg, #20, to be taken one capsule twice daily for 10 days. -Patient given printed instructions for epsom salt soaks. She is to perform once daily until she sees Dr. Medicine. Also needs evaluation of left foot sulcus area. -Patient/POA to call should there be question/concern in the interim. Return in about 3 months (around 10/19/2024).  Delon LITTIE Merlin, DPM      [place] LOCATION: [address] [place] [address]                   Office [phone]   [facility] LOCATION: [address] [place], [place] [address] Office [phone]

## 2024-07-19 NOTE — Patient Instructions (Signed)
EPSOM SALT FOOT SOAK INSTRUCTIONS  *IF YOU HAVE BEEN PRESCRIBED ANTIBIOTICS, TAKE AS INSTRUCTED UNTIL ALL ARE GONE*  Shopping List:  A. Plain epsom salt (not scented) B. Neosporin Cream C. 1-inch fabric band-aids   Place 1/4 cup of epsom salts in 2 quarts of warm tap water. IF YOU ARE DIABETIC, OR HAVE NEUROPATHY, CHECK THE TEMPERATURE OF THE WATER WITH YOUR ELBOW.   Submerge your foot/feet in the solution and soak for 10-15 minutes.      3.  Next, remove your foot/feet from solution, blot dry the affected area.    4.  Apply light amount of antibiotic cream/ointment and cover with fabric band-aid .  5.  This soak should be done once a day for 10 days.   6.  Monitor for any signs/symptoms of infection such as redness, swelling, odor, drainage, increased pain, or non-healing of digit.   7.  Please do not hesitate to call the office and speak to a Nurse or Doctor if you have questions.   8.  If you experience fever, chills, nightsweats, nausea or vomiting with worsening of digit/foot, please go to the emergency room.

## 2024-07-28 ENCOUNTER — Ambulatory Visit: Admitting: Podiatry

## 2024-07-28 ENCOUNTER — Other Ambulatory Visit (HOSPITAL_BASED_OUTPATIENT_CLINIC_OR_DEPARTMENT_OTHER): Payer: Self-pay

## 2024-07-28 VITALS — Ht 64.0 in | Wt 200.0 lb

## 2024-07-28 DIAGNOSIS — L600 Ingrowing nail: Secondary | ICD-10-CM | POA: Diagnosis not present

## 2024-07-28 MED ORDER — NEOMYCIN-POLYMYXIN-HC 1 % OT SOLN
OTIC | 0 refills | Status: AC
  Filled 2024-07-28: qty 10, 14d supply, fill #0
  Filled 2024-07-28: qty 10, 90d supply, fill #0

## 2024-07-28 NOTE — Patient Instructions (Signed)
 Soak Instructions    THE DAY AFTER THE PROCEDURE  Place 1/4 cup of epsom salts (or betadine , or white vinegar) in a quart of warm tap water.  Submerge your foot or feet with outer bandage intact for the initial soak; this will allow the bandage to become moist and wet for easy lift off.  Once you remove your bandage, continue to soak in the solution for 20 minutes.  This soak should be done twice a day.  Next, remove your foot or feet from solution, blot dry the affected area and apply 2-3 of the Cortisporin drops if they were prescribed for you.  If you did not receive a prescription use regular Neosporin or antibiotic ointment.  Then cover with a regular Band-Aid.  Do this for at least 2 weeks.  Longer if you are still having drainage redness or irritation  IF YOUR SKIN BECOMES IRRITATED WHILE USING THESE INSTRUCTIONS, IT IS OKAY TO SWITCH TO  WHITE VINEGAR AND WATER. Or you may use antibacterial soap and water to keep the toe clean  Monitor for any signs/symptoms of infection. Call the office immediately if any occur or go directly to the emergency room. Call with any questions/concerns.    Long Term Care Instructions-Post Nail Surgery  You have had your ingrown toenail and root treated with a chemical.  This chemical causes a burn that will drain and ooze like a blister.  This can drain for 6-8 weeks or longer.  It is important to keep this area clean, covered, and follow the soaking instructions dispensed at the time of your surgery.  This area will eventually dry and form a scab.  Once the scab forms you no longer need to soak or apply a dressing.  If at any time you experience an increase in pain, redness, swelling, or drainage, you should contact the office as soon as possible.

## 2024-07-29 ENCOUNTER — Other Ambulatory Visit: Payer: Self-pay

## 2024-07-29 NOTE — Progress Notes (Signed)
  Subjective:  Patient ID: [name], female    DOB: 1951/09/25,  MRN: [id number]  Chief Complaint  Patient presents with   Nail Problem    Rm 2 Patient is here for an evaluation of right and left hallux and fourth right toe for possible ingrown toe nails.    73 y.o. female presents with the above complaint. History confirmed with patient.  She is completing antibiotics that have been prescribed has a couple days left of the fourth toe is doing better now but she has had chronic issues with ingrowing nails of the both big toes.  Her A1c is well-controlled.  Objective:  Physical Exam: warm, good capillary refill, no trophic changes or ulcerative lesions, normal DP and PT pulses, normal sensory exam, and incurvated medial and lateral borders of bilateral hallux, resolving paronychia right lateral fourth toe with no pain  Assessment:   1. Ingrown nail      Plan:  Patient was evaluated and treated and all questions answered.     Ingrown Nail, bilaterally -Patient elects to proceed with minor surgery to remove ingrown toenail today. Consent reviewed and signed by patient. -Ingrown nail excised. See procedure note. -Educated on post-procedure care including soaking. Written instructions provided and reviewed. -Rx for Cortisporin  sent to pharmacy. -Advised on signs and symptoms of infection developing.  We discussed that the phenol likely will create some redness and edema and tenderness around the nailbed as long as it is localized this is to be expected.  Will return as needed if any infection signs develop -Fourth toe on the right foot was not a chronic issue for her prior to receiving a pedicure in January but started this.  She will monitor and if this recurs we will plan for partial matricectomy of that lateral border if needed  Procedure: Excision of Ingrown Toenail Location: Bilateral 1st toe medial and lateral nail borders. Anesthesia: Lidocaine 1% plain; 1.5 mL and  Marcaine 0.5% plain; 1.5 mL, digital block. Skin Prep: Betadine. Dressing: Silvadene; telfa; dry, sterile, compression dressing. Technique: Following skin prep, the toe was exsanguinated and a tourniquet was secured at the base of the toe. The affected nail border was freed, split with a nail splitter, and excised. Chemical matrixectomy was then performed with phenol and irrigated out with alcohol. The tourniquet was then removed and sterile dressing applied. Disposition: Patient tolerated procedure well.    No follow-ups on file.

## 2024-08-12 DIAGNOSIS — I951 Orthostatic hypotension: Secondary | ICD-10-CM | POA: Diagnosis not present

## 2024-08-12 DIAGNOSIS — E781 Pure hyperglyceridemia: Secondary | ICD-10-CM | POA: Diagnosis not present

## 2024-08-12 DIAGNOSIS — N184 Chronic kidney disease, stage 4 (severe): Secondary | ICD-10-CM | POA: Diagnosis not present

## 2024-08-12 DIAGNOSIS — I10 Essential (primary) hypertension: Secondary | ICD-10-CM | POA: Diagnosis not present

## 2024-08-13 ENCOUNTER — Emergency Department (HOSPITAL_BASED_OUTPATIENT_CLINIC_OR_DEPARTMENT_OTHER)

## 2024-08-13 ENCOUNTER — Other Ambulatory Visit: Payer: Self-pay

## 2024-08-13 ENCOUNTER — Encounter (HOSPITAL_BASED_OUTPATIENT_CLINIC_OR_DEPARTMENT_OTHER): Payer: Self-pay

## 2024-08-13 ENCOUNTER — Emergency Department (HOSPITAL_BASED_OUTPATIENT_CLINIC_OR_DEPARTMENT_OTHER)
Admission: EM | Admit: 2024-08-13 | Discharge: 2024-08-13 | Disposition: A | Discharge status: Home / Self Care | Attending: Emergency Medicine | Admitting: Emergency Medicine

## 2024-08-13 DIAGNOSIS — E1122 Type 2 diabetes mellitus with diabetic chronic kidney disease: Secondary | ICD-10-CM | POA: Diagnosis not present

## 2024-08-13 DIAGNOSIS — N184 Chronic kidney disease, stage 4 (severe): Secondary | ICD-10-CM

## 2024-08-13 DIAGNOSIS — R000 Tachycardia, unspecified: Secondary | ICD-10-CM | POA: Diagnosis not present

## 2024-08-13 DIAGNOSIS — Z794 Long term (current) use of insulin: Secondary | ICD-10-CM | POA: Insufficient documentation

## 2024-08-13 DIAGNOSIS — I471 Supraventricular tachycardia, unspecified: Secondary | ICD-10-CM | POA: Diagnosis not present

## 2024-08-13 DIAGNOSIS — I129 Hypertensive chronic kidney disease with stage 1 through stage 4 chronic kidney disease, or unspecified chronic kidney disease: Secondary | ICD-10-CM | POA: Diagnosis not present

## 2024-08-13 DIAGNOSIS — N189 Chronic kidney disease, unspecified: Secondary | ICD-10-CM | POA: Insufficient documentation

## 2024-08-13 DIAGNOSIS — Z79899 Other long term (current) drug therapy: Secondary | ICD-10-CM | POA: Diagnosis not present

## 2024-08-13 DIAGNOSIS — Z7982 Long term (current) use of aspirin: Secondary | ICD-10-CM | POA: Diagnosis not present

## 2024-08-13 LAB — CBC WITH DIFFERENTIAL/PLATELET
Abs Immature Granulocytes: 0.04 K/uL (ref 0.00–0.07)
Basophils Absolute: 0 K/uL (ref 0.0–0.1)
Basophils Relative: 0 %
Eosinophils Absolute: 0.2 K/uL (ref 0.0–0.5)
Eosinophils Relative: 3 %
HCT: 44.4 % (ref 36.0–46.0)
Hemoglobin: 14.4 g/dL (ref 12.0–15.0)
Immature Granulocytes: 0 %
Lymphocytes Relative: 18 %
Lymphs Abs: 1.6 K/uL (ref 0.7–4.0)
MCH: 31.2 pg (ref 26.0–34.0)
MCHC: 32.4 g/dL (ref 30.0–36.0)
MCV: 96.3 fL (ref 80.0–100.0)
Monocytes Absolute: 0.9 K/uL (ref 0.1–1.0)
Monocytes Relative: 9 %
Neutro Abs: 6.4 K/uL (ref 1.7–7.7)
Neutrophils Relative %: 70 %
Platelets: 317 K/uL (ref 150–400)
RBC: 4.61 MIL/uL (ref 3.87–5.11)
RDW: 13.5 % (ref 11.5–15.5)
WBC: 9.2 K/uL (ref 4.0–10.5)
nRBC: 0 % (ref 0.0–0.2)

## 2024-08-13 LAB — BASIC METABOLIC PANEL WITH GFR
Anion gap: 16 — ABNORMAL HIGH (ref 5–15)
BUN: 38 mg/dL — ABNORMAL HIGH (ref 8–23)
CO2: 21 mmol/L — ABNORMAL LOW (ref 22–32)
Calcium: 9.9 mg/dL (ref 8.9–10.3)
Chloride: 107 mmol/L (ref 98–111)
Creatinine, Ser: 2.62 mg/dL — ABNORMAL HIGH (ref 0.44–1.00)
GFR, Estimated: 19 mL/min — ABNORMAL LOW (ref >60)
Glucose, Bld: 156 mg/dL — ABNORMAL HIGH (ref 70–99)
Potassium: 4.6 mmol/L (ref 3.5–5.1)
Sodium: 144 mmol/L (ref 135–145)

## 2024-08-13 LAB — TROPONIN T, HIGH SENSITIVITY: Troponin T High Sensitivity: 16 ng/L (ref 0–19)

## 2024-08-13 MED ORDER — ADENOSINE 6 MG/2ML IV SOLN
6.0000 mg | Freq: Once | INTRAVENOUS | Status: AC
  Administered 2024-08-13: 6 mg via INTRAVENOUS
  Filled 2024-08-13: qty 2

## 2024-08-13 MED ORDER — ADENOSINE 6 MG/2ML IV SOLN
INTRAVENOUS | Status: DC
Start: 2024-08-13 — End: 2024-08-13
  Filled 2024-08-13: qty 2

## 2024-08-13 MED ORDER — ADENOSINE 6 MG/2ML IV SOLN
INTRAVENOUS | Status: AC
  Filled 2024-08-13: qty 2

## 2024-08-13 NOTE — ED Notes (Signed)
 Reviewed AVS/discharge instruction with patient. Time allotted for and all questions answered. Patient is agreeable for d/c and escorted to ed exit by staff.

## 2024-08-13 NOTE — Discharge Instructions (Signed)
 Follow-up with your cardiologist at [facility].  As you know you have a history of chronic kidney disease but it is stable from March.  Return for recurrent rapid heart rate lasting 40 minutes or longer.

## 2024-08-13 NOTE — ED Provider Notes (Addendum)
 [place] EMERGENCY DEPARTMENT AT [facility] Provider Note   CSN: [id number] Arrival date & time: 08/13/24  1549     Patient presents with: Tachycardia and Chest Pain   [name] is a 73 y.o. female.   Patient followed by cardiology at [facility].  In 2022 patient had an episode of supraventricular tachycardia seen here and was converted with adenosine .  She Was admitted at that time.  Not completely clear why.  Patient looks like she is on Coreg  patient is on a baby aspirin  a day no other blood thinner.  Her cardiologist is opted not to treat her any with anything specifically because the SVT was few and far between.  Past medical history significant for high cholesterol gastroesophageal reflux disease type 2 diabetes hypertension chronic kidney disease patient is never used tobacco products  Patient denies any chest pain or shortness of breath with this.  She did develop right before we got cardioverted her with adenosine  some discomfort in the neck.  Onset of the rapid heart rate was approximately 1 hour prior to arrival.       Prior to Admission medications   Medication Sig Start Date End Date Taking? Authorizing Provider  ACCU-CHEK GUIDE TEST test strip Use 1 test strip 3 times daily. 05/25/24     Accu-Chek Softclix Lancets lancets Use 1 each 3 times daily. 05/25/24     Ascorbic Acid (VITAMIN C) 500 MG CAPS Take 500 mg by mouth every morning. Super C    [provider]  aspirin  81 MG chewable tablet Chew 81 mg by mouth daily.    [provider]  atorvastatin  (LIPITOR) 40 MG tablet Take 1 tablet (40 mg total) by mouth at bedtime 04/15/24     carvedilol  (COREG ) 25 MG tablet Take 1 tablet (25 mg total) by mouth 2 (two) times daily 04/13/24     cholecalciferol (VITAMIN D ) 1000 units tablet Take 1,000 Units by mouth daily.    [provider]  Continuous Glucose Sensor (DEXCOM G7 SENSOR) MISC Use for monitoring blood sugars daily. Change sensor  every 10 days. 05/17/24     ezetimibe  (ZETIA ) 10 MG tablet Take 1 tablet (10 mg total) by mouth daily. 02/04/24     febuxostat  (ULORIC ) 40 MG tablet Take 1 tablet (40 mg total) by mouth daily. 03/23/24     furosemide  (LASIX ) 20 MG tablet Take 20 mg by mouth daily as needed for fluid or edema.    [provider]  influenza vaccine adjuvanted (FLUAD ) 0.5 ML injection Inject into the muscle. 09/24/22   Luiz Channel, MD  insulin  glargine (LANTUS ) 100 UNIT/ML Solostar Pen Inject 34 Units into the skin daily. 12/21/23     Insulin  Pen Needle 32G X 4 MM MISC Use 1 each daily for insulin  administration. 05/25/24     loratadine (CLARITIN) 10 MG tablet Take 10 mg by mouth daily as needed for allergies.    [provider]  Multiple Vitamin (MULTIVITAMIN) capsule Take 1 capsule by mouth daily.    [provider]  NEOMYCIN -POLYMYXIN-HYDROCORTISONE (CORTISPORIN ) 1 % SOLN OTIC solution apply 1 to 2 drops daily after soaking and cover with bandaid 07/28/24   McDonald, Juliene SAUNDERS, DPM  nitroGLYCERIN  (NITROSTAT ) 0.4 MG SL tablet Place under the tongue every 5 (five) minutes as needed for chest pain. 11/09/20   [provider]  nystatin  cream (MYCOSTATIN ) Apply 1 Application (pea size amount) topically 2 (two) times daily. 05/11/24     omega-3 acid ethyl esters (  LOVAZA ) 1 g capsule Take 1 capsule (1 g total) by mouth 2 (two) times daily. 02/04/24     promethazine  (PHENERGAN ) 25 MG tablet Take 25 mg by mouth daily as needed for nausea.    [provider]  Semaglutide , 2 MG/DOSE, (OZEMPIC , 2 MG/DOSE,) 8 MG/3ML SOPN Inject 2 mg into the skin once a week. 03/18/24     sodium bicarbonate 650 MG tablet Take 1,300 mg by mouth 2 (two) times daily.    [provider]  sodium zirconium cyclosilicate  (LOKELMA ) 10 g PACK packet Take 10 g (1 packet total) by mouth every other day. 03/18/24     Tdap (BOOSTRIX ) 5-2.5-18.5 LF-MCG/0.5 injection Inject into the muscle. 04/21/24   Luiz Channel,  MD  tobramycin  (TOBREX ) 0.3 % ophthalmic solution Place 2 drops into both eyes 4 (four) times daily. 06/01/24   Perri Ronal PARAS, MD    Allergies: Iodinated contrast media, Metformin, Metformin and related, Ciprocin-fluocin-procin [fluocinolone], Ciprofloxacin, Fenofibrate, Insulin  degludec, Insulins, Niacin, Orange fruit [citrus], Ramipril, Sulfamethoxazole-trimethoprim, Insulin  aspart (human analog) (yeast), and Sulfa antibiotics    Review of Systems  Constitutional:  Negative for chills and fever.  HENT:  Negative for ear pain and sore throat.   Eyes:  Negative for pain and visual disturbance.  Respiratory:  Negative for cough and shortness of breath.   Cardiovascular:  Positive for palpitations. Negative for chest pain.  Gastrointestinal:  Negative for abdominal pain and vomiting.  Genitourinary:  Negative for dysuria and hematuria.  Musculoskeletal:  Negative for arthralgias and back pain.  Skin:  Negative for color change and rash.  Neurological:  Negative for seizures and syncope.  All other systems reviewed and are negative.   Updated Vital Signs BP 128/89   Pulse (!) 118   Temp 98 F (36.7 C) (Oral)   Resp 17   Ht 1.626 m (5' 4)   Wt 86.2 kg   SpO2 99%   BMI 32.61 kg/m   Physical Exam Vitals and nursing note reviewed.  Constitutional:      General: She is not in acute distress.    Appearance: Normal appearance. She is well-developed. She is not ill-appearing or diaphoretic.  HENT:     Head: Normocephalic and atraumatic.     Mouth/Throat:     Mouth: Mucous membranes are moist.  Eyes:     Extraocular Movements: Extraocular movements intact.     Conjunctiva/sclera: Conjunctivae normal.     Pupils: Pupils are equal, round, and reactive to light.  Cardiovascular:     Rate and Rhythm: Regular rhythm. Tachycardia present.     Heart sounds: No murmur heard. Pulmonary:     Effort: Pulmonary effort is normal. No respiratory distress.     Breath sounds: Normal breath  sounds.  Abdominal:     Palpations: Abdomen is soft.     Tenderness: There is no abdominal tenderness.  Musculoskeletal:        General: No swelling.     Cervical back: Normal range of motion and neck supple.     Right lower leg: No edema.     Left lower leg: No edema.  Skin:    General: Skin is warm and dry.     Capillary Refill: Capillary refill takes less than 2 seconds.  Neurological:     General: No focal deficit present.     Mental Status: She is alert and oriented to person, place, and time.  Psychiatric:        Mood and Affect: Mood normal.     (  all labs ordered are listed, but only abnormal results are displayed) Labs Reviewed  BASIC METABOLIC PANEL WITH GFR  CBC WITH DIFFERENTIAL/PLATELET  TROPONIN T, HIGH SENSITIVITY    EKG: EKG Interpretation Date/Time:  Friday August 13 2024 15:59:38 EDT Ventricular Rate:  192 PR Interval:    QRS Duration:  72 QT Interval:  256 QTC Calculation: 457 R Axis:   -40  Text Interpretation: Supraventricular tachycardia Left axis deviation Anterolateral infarct , age undetermined Abnormal ECG When compared with ECG of 06-Oct-2021 02:48, Significant changes have occurred Confirmed by Camera Krienke [phone]) on 08/13/2024 4:13:28 PM  Radiology: No results found.   Procedures   Medications Ordered in the ED  adenosine  (ADENOCARD ) 6 MG/2ML injection (has no administration in time range)  adenosine  (ADENOCARD ) 6 MG/2ML injection (has no administration in time range)  adenosine  (ADENOCARD ) 6 MG/2ML injection 6 mg (6 mg Intravenous Given 08/13/24 1622)                                    Medical Decision Making Amount and/or Complexity of Data Reviewed Labs: ordered. Radiology: ordered.  Risk Prescription drug management.   Patient got 6 mg of adenosine  cardiac monitoring on 2 L of oxygen .  She converted back to normal sinus rhythm.  Will keep her on heart monitor.  Will get labs chest x-ray hopefully she will stay out of  the SVT.  And be stable for discharge home.  CRITICAL CARE Performed by: Rickey Farrier Total critical care time: 45 minutes Critical care time was exclusive of separately billable procedures and treating other patients. Critical care was necessary to treat or prevent imminent or life-threatening deterioration. Critical care was time spent personally by me on the following activities: development of treatment plan with patient and/or surrogate as well as nursing, discussions with consultants, evaluation of patient's response to treatment, examination of patient, obtaining history from patient or surrogate, ordering and performing treatments and interventions, ordering and review of laboratory studies, ordering and review of radiographic studies, pulse oximetry and re-evaluation of patient's condition.  Patient has remained in sinus rhythm.  Basic metabolic panel significant for GFR 19 but unchanged from March of this year.  Patient known to have chronic kidney disease.  Creatinine is 2.62 potassium is normal at 4.6.  Initial troponin normal at 16.  I did not order the troponin.  Does not need delta troponin.  CBC white count 9.2 hemoglobin 14.4 platelets 317.  Chest x-ray without any acute findings.  Patient stable for discharge home follow-up with her cardiologist at [facility].   Final diagnoses:  SVT (supraventricular tachycardia) [facility])    ED Discharge Orders     None          Geraldene Hamilton, MD 08/13/24 [id number]    Geraldene Hamilton, MD 08/13/24 [phone]

## 2024-08-13 NOTE — ED Triage Notes (Signed)
 Pt reports sudden onsent of chest pain and tachycardia x1 hour ago. Pt reports hx of SVT.

## 2024-08-14 ENCOUNTER — Other Ambulatory Visit (HOSPITAL_BASED_OUTPATIENT_CLINIC_OR_DEPARTMENT_OTHER): Payer: Self-pay

## 2024-09-11 ENCOUNTER — Other Ambulatory Visit (HOSPITAL_BASED_OUTPATIENT_CLINIC_OR_DEPARTMENT_OTHER): Payer: Self-pay

## 2024-09-13 ENCOUNTER — Other Ambulatory Visit (HOSPITAL_BASED_OUTPATIENT_CLINIC_OR_DEPARTMENT_OTHER): Payer: Self-pay

## 2024-09-14 ENCOUNTER — Other Ambulatory Visit (HOSPITAL_BASED_OUTPATIENT_CLINIC_OR_DEPARTMENT_OTHER): Payer: Self-pay

## 2024-09-14 ENCOUNTER — Other Ambulatory Visit: Payer: Self-pay

## 2024-09-15 ENCOUNTER — Other Ambulatory Visit (HOSPITAL_BASED_OUTPATIENT_CLINIC_OR_DEPARTMENT_OTHER): Payer: Self-pay

## 2024-09-15 MED ORDER — FLUZONE HIGH-DOSE 0.5 ML IM SUSY
0.5000 mL | PREFILLED_SYRINGE | Freq: Once | INTRAMUSCULAR | 0 refills | Status: AC
Start: 2024-09-15 — End: 2024-09-16
  Filled 2024-09-15: qty 0.5, 1d supply, fill #0

## 2024-09-21 DIAGNOSIS — N184 Chronic kidney disease, stage 4 (severe): Secondary | ICD-10-CM | POA: Diagnosis not present

## 2024-09-21 DIAGNOSIS — Z79899 Other long term (current) drug therapy: Secondary | ICD-10-CM | POA: Diagnosis not present

## 2024-09-21 DIAGNOSIS — Z794 Long term (current) use of insulin: Secondary | ICD-10-CM | POA: Diagnosis not present

## 2024-09-21 DIAGNOSIS — Z7985 Long-term (current) use of injectable non-insulin antidiabetic drugs: Secondary | ICD-10-CM | POA: Diagnosis not present

## 2024-09-21 DIAGNOSIS — E1122 Type 2 diabetes mellitus with diabetic chronic kidney disease: Secondary | ICD-10-CM | POA: Diagnosis not present

## 2024-10-04 DIAGNOSIS — Z961 Presence of intraocular lens: Secondary | ICD-10-CM | POA: Diagnosis not present

## 2024-10-04 DIAGNOSIS — H26491 Other secondary cataract, right eye: Secondary | ICD-10-CM | POA: Diagnosis not present

## 2024-10-04 DIAGNOSIS — N184 Chronic kidney disease, stage 4 (severe): Secondary | ICD-10-CM | POA: Diagnosis not present

## 2024-10-04 DIAGNOSIS — H52203 Unspecified astigmatism, bilateral: Secondary | ICD-10-CM | POA: Diagnosis not present

## 2024-10-04 DIAGNOSIS — E119 Type 2 diabetes mellitus without complications: Secondary | ICD-10-CM | POA: Diagnosis not present

## 2024-10-04 DIAGNOSIS — N189 Chronic kidney disease, unspecified: Secondary | ICD-10-CM | POA: Diagnosis not present

## 2024-10-08 ENCOUNTER — Other Ambulatory Visit (HOSPITAL_BASED_OUTPATIENT_CLINIC_OR_DEPARTMENT_OTHER): Payer: Self-pay

## 2024-10-08 ENCOUNTER — Ambulatory Visit: Admitting: Podiatry

## 2024-10-08 DIAGNOSIS — I129 Hypertensive chronic kidney disease with stage 1 through stage 4 chronic kidney disease, or unspecified chronic kidney disease: Secondary | ICD-10-CM | POA: Diagnosis not present

## 2024-10-08 DIAGNOSIS — E785 Hyperlipidemia, unspecified: Secondary | ICD-10-CM | POA: Diagnosis not present

## 2024-10-08 DIAGNOSIS — N179 Acute kidney failure, unspecified: Secondary | ICD-10-CM | POA: Diagnosis not present

## 2024-10-08 DIAGNOSIS — N2581 Secondary hyperparathyroidism of renal origin: Secondary | ICD-10-CM | POA: Diagnosis not present

## 2024-10-08 DIAGNOSIS — R809 Proteinuria, unspecified: Secondary | ICD-10-CM | POA: Diagnosis not present

## 2024-10-08 DIAGNOSIS — N184 Chronic kidney disease, stage 4 (severe): Secondary | ICD-10-CM | POA: Diagnosis not present

## 2024-10-08 DIAGNOSIS — R399 Unspecified symptoms and signs involving the genitourinary system: Secondary | ICD-10-CM | POA: Diagnosis not present

## 2024-10-08 DIAGNOSIS — N2589 Other disorders resulting from impaired renal tubular function: Secondary | ICD-10-CM | POA: Diagnosis not present

## 2024-10-08 MED ORDER — AMOXICILLIN-POT CLAVULANATE 500-125 MG PO TABS
1.0000 | ORAL_TABLET | Freq: Two times a day (BID) | ORAL | 0 refills | Status: AC
  Filled 2024-10-08: qty 10, 5d supply, fill #0

## 2024-10-08 MED ORDER — FUROSEMIDE 20 MG PO TABS
20.0000 mg | ORAL_TABLET | Freq: Every day | ORAL | 5 refills | Status: AC | PRN
Start: 2024-10-08 — End: ?
  Filled 2024-10-08: qty 30, 30d supply, fill #0

## 2024-10-08 MED ORDER — PROMETHAZINE HCL 25 MG PO TABS
25.0000 mg | ORAL_TABLET | Freq: Every day | ORAL | 0 refills | Status: AC | PRN
  Filled 2024-10-08: qty 21, 21d supply, fill #0

## 2024-10-19 DIAGNOSIS — Z131 Encounter for screening for diabetes mellitus: Secondary | ICD-10-CM | POA: Diagnosis not present

## 2024-10-19 DIAGNOSIS — N183 Chronic kidney disease, stage 3 unspecified: Secondary | ICD-10-CM | POA: Diagnosis not present

## 2024-10-19 DIAGNOSIS — N186 End stage renal disease: Secondary | ICD-10-CM | POA: Diagnosis not present

## 2024-10-19 DIAGNOSIS — E1122 Type 2 diabetes mellitus with diabetic chronic kidney disease: Secondary | ICD-10-CM | POA: Diagnosis not present

## 2024-10-19 DIAGNOSIS — Z01818 Encounter for other preprocedural examination: Secondary | ICD-10-CM | POA: Diagnosis not present

## 2024-10-19 DIAGNOSIS — E781 Pure hyperglyceridemia: Secondary | ICD-10-CM | POA: Diagnosis not present

## 2024-10-19 DIAGNOSIS — I120 Hypertensive chronic kidney disease with stage 5 chronic kidney disease or end stage renal disease: Secondary | ICD-10-CM | POA: Diagnosis not present

## 2024-10-19 DIAGNOSIS — Z114 Encounter for screening for human immunodeficiency virus [HIV]: Secondary | ICD-10-CM | POA: Diagnosis not present

## 2024-10-19 DIAGNOSIS — I10 Essential (primary) hypertension: Secondary | ICD-10-CM | POA: Diagnosis not present

## 2024-10-19 DIAGNOSIS — N184 Chronic kidney disease, stage 4 (severe): Secondary | ICD-10-CM | POA: Diagnosis not present

## 2024-10-19 DIAGNOSIS — N368 Other specified disorders of urethra: Secondary | ICD-10-CM | POA: Diagnosis not present

## 2024-10-19 DIAGNOSIS — Z1159 Encounter for screening for other viral diseases: Secondary | ICD-10-CM | POA: Diagnosis not present

## 2024-10-19 DIAGNOSIS — Z7682 Awaiting organ transplant status: Secondary | ICD-10-CM | POA: Diagnosis not present

## 2024-10-19 DIAGNOSIS — E119 Type 2 diabetes mellitus without complications: Secondary | ICD-10-CM | POA: Diagnosis not present

## 2024-10-21 ENCOUNTER — Encounter: Payer: Self-pay | Admitting: Podiatry

## 2024-10-21 ENCOUNTER — Ambulatory Visit: Admitting: Podiatry

## 2024-10-21 DIAGNOSIS — M79674 Pain in right toe(s): Secondary | ICD-10-CM

## 2024-10-21 DIAGNOSIS — E1142 Type 2 diabetes mellitus with diabetic polyneuropathy: Secondary | ICD-10-CM

## 2024-10-21 DIAGNOSIS — B351 Tinea unguium: Secondary | ICD-10-CM | POA: Diagnosis not present

## 2024-10-21 DIAGNOSIS — M79675 Pain in left toe(s): Secondary | ICD-10-CM | POA: Diagnosis not present

## 2024-10-21 NOTE — Progress Notes (Signed)
 Subjective:  Patient ID: [name], female    DOB: 1951/06/06,  MRN: [id number]  [name] presents to clinic today for preventative diabetic foot care and painful, discolored, thick toenails which interfere with daily activities. Patient has seen Dr. Silva and had both borders of bilateral great toes permanently removed. States they feel much better. Chief Complaint  Patient presents with   Toe Pain    Diabetic foot care. Dr. Perri is her PCP. Last visit was in  June. A1c is 5.6 at [facility] problem(s): None.   PCP is Baxley, Ronal PARAS, MD.  Allergies  Allergen Reactions   Iodinated Contrast Media Other (See Comments)    Kidney   Metformin     Other reaction(s): Other (See Comments), Unknown Facial puffiness  Pt stated her kidney specialist told her to add metformin to her drug allergies list.    Metformin And Related Other (See Comments)    KIDNEY FAILURE?   Ciprocin-Fluocin-Procin [Fluocinolone]     Acute kidney failure   Ciprofloxacin Other (See Comments)    Kidney failure   Fenofibrate     Other reaction(s): fever   Insulin  Degludec Other (See Comments)    Headache   Insulins Other (See Comments)    Migraines, nausea Other reaction(s): Other (See Comments), Unknown Migraines, nausea NOVLOG    Niacin     Other reaction(s): Unknown   Orange Fruit [Citrus] Other (See Comments)    Orange juice   Ramipril     Other reaction(s): cough   Sulfamethoxazole-Trimethoprim     Other reaction(s): Unknown   Insulin  Aspart (Human Analog) (Yeast) Rash   Sulfa Antibiotics Rash and Dermatitis    Review of Systems: Negative except as noted in the HPI.  Objective: There were no vitals filed for this visit. [name] is a pleasant 73 y.o. female WD, WN in NAD. AAO x 3.  Vascular Examination: Capillary refill time immediate b/l. Palpable pedal pulses. Pedal hair present b/l. No pain with calf compression b/l. Skin temperature  gradient WNL b/l. No cyanosis or clubbing b/l. No ischemia or gangrene noted b/l.   Neurological Examination: Sensation grossly intact b/l with 10 gram monofilament. Vibratory sensation intact b/l. Pt has subjective symptoms of neuropathy.  Dermatological Examination: Pedal skin with normal turgor, texture and tone b/l.  No open wounds. No interdigital macerations.   Toenails bilateral great toes elongated, discolored, dystrophic, thickened, and crumbly with subungual debris and tenderness to dorsal palpation.  Evidence of partial matrixectomy both borders of left hallux and both borders of right hallux.  Musculoskeletal Examination: Muscle strength 5/5 to all lower extremity muscle groups bilaterally. HAV with bunion deformity noted b/l LE.SABRA No pain, crepitus or joint limitation noted with ROM b/l LE.  Patient ambulates independently without assistive aids.  Radiographs: None  Assessment/Plan: 1. Pain due to onychomycosis of toenails of both feet   2. Diabetic peripheral neuropathy associated with type 2 diabetes mellitus [facility])   Consent given for treatment. Patient examined. All patient's and/or POA's questions/concerns addressed on today's visit. Toenails bilateral great toes debrided in length and girth without incident. Continue foot and shoe inspections daily. Monitor blood glucose per PCP/Endocrinologist's recommendations. Continue soft, supportive shoe gear daily. Report any pedal injuries to medical professional. Call office if there are any questions/concerns. -Patient/POA to call should there be question/concern in the interim.   Return in about 6 months (around 04/21/2025).  [name], DPM      Manuel Garcia LOCATION: [address] [place], [place] [address]                   Office [phone]   [facility] LOCATION: [address] [place], [place] [address] Office [phone]

## 2024-10-22 ENCOUNTER — Other Ambulatory Visit (HOSPITAL_BASED_OUTPATIENT_CLINIC_OR_DEPARTMENT_OTHER): Payer: Self-pay

## 2024-11-09 ENCOUNTER — Other Ambulatory Visit: Payer: Self-pay

## 2024-12-03 DIAGNOSIS — N184 Chronic kidney disease, stage 4 (severe): Secondary | ICD-10-CM | POA: Diagnosis not present

## 2024-12-03 DIAGNOSIS — E785 Hyperlipidemia, unspecified: Secondary | ICD-10-CM | POA: Diagnosis not present

## 2024-12-19 ENCOUNTER — Other Ambulatory Visit (HOSPITAL_BASED_OUTPATIENT_CLINIC_OR_DEPARTMENT_OTHER): Payer: Self-pay

## 2024-12-20 ENCOUNTER — Other Ambulatory Visit: Payer: Self-pay

## 2024-12-21 ENCOUNTER — Other Ambulatory Visit (HOSPITAL_BASED_OUTPATIENT_CLINIC_OR_DEPARTMENT_OTHER): Payer: Self-pay

## 2025-01-06 ENCOUNTER — Other Ambulatory Visit (HOSPITAL_BASED_OUTPATIENT_CLINIC_OR_DEPARTMENT_OTHER): Payer: Self-pay

## 2025-01-07 ENCOUNTER — Other Ambulatory Visit (HOSPITAL_BASED_OUTPATIENT_CLINIC_OR_DEPARTMENT_OTHER): Payer: Self-pay

## 2025-01-08 ENCOUNTER — Other Ambulatory Visit (HOSPITAL_BASED_OUTPATIENT_CLINIC_OR_DEPARTMENT_OTHER): Payer: Self-pay

## 2025-01-10 ENCOUNTER — Other Ambulatory Visit: Payer: Self-pay | Admitting: Internal Medicine

## 2025-01-10 DIAGNOSIS — Z1231 Encounter for screening mammogram for malignant neoplasm of breast: Secondary | ICD-10-CM

## 2025-01-25 ENCOUNTER — Other Ambulatory Visit (HOSPITAL_BASED_OUTPATIENT_CLINIC_OR_DEPARTMENT_OTHER): Payer: Self-pay

## 2025-01-26 ENCOUNTER — Other Ambulatory Visit (HOSPITAL_BASED_OUTPATIENT_CLINIC_OR_DEPARTMENT_OTHER): Payer: Self-pay

## 2025-01-26 ENCOUNTER — Other Ambulatory Visit: Payer: Self-pay

## 2025-01-28 ENCOUNTER — Other Ambulatory Visit (HOSPITAL_BASED_OUTPATIENT_CLINIC_OR_DEPARTMENT_OTHER): Payer: Self-pay

## 2025-01-28 MED ORDER — ACCU-CHEK SOFTCLIX LANCETS MISC: 3 refills | Status: AC

## 2025-01-28 MED ORDER — DEXCOM G7 15 DAY SENSOR MISC
3 refills | Status: AC
  Filled 2025-01-31 – 2025-02-01 (×2): qty 6, 90d supply, fill #0

## 2025-01-28 MED ORDER — ACCU-CHEK GUIDE TEST VI STRP: ORAL_STRIP | 3 refills | Status: AC

## 2025-01-28 MED ORDER — INSULIN PEN NEEDLE 32G X 4 MM MISC
4 refills | Status: AC
  Filled 2025-02-01: qty 100, 100d supply, fill #0

## 2025-01-28 MED ORDER — LANTUS SOLOSTAR 100 UNIT/ML ~~LOC~~ SOPN
14.0000 [IU] | PEN_INJECTOR | Freq: Every day | SUBCUTANEOUS | 5 refills | Status: AC
  Filled 2025-01-31: qty 15, 107d supply, fill #0

## 2025-01-31 ENCOUNTER — Other Ambulatory Visit (HOSPITAL_BASED_OUTPATIENT_CLINIC_OR_DEPARTMENT_OTHER): Payer: Self-pay

## 2025-02-01 ENCOUNTER — Other Ambulatory Visit (HOSPITAL_BASED_OUTPATIENT_CLINIC_OR_DEPARTMENT_OTHER): Payer: Self-pay

## 2025-02-01 ENCOUNTER — Other Ambulatory Visit: Payer: Self-pay

## 2025-02-01 ENCOUNTER — Encounter (HOSPITAL_BASED_OUTPATIENT_CLINIC_OR_DEPARTMENT_OTHER): Payer: Self-pay

## 2025-02-01 ENCOUNTER — Other Ambulatory Visit (HOSPITAL_COMMUNITY): Payer: Self-pay

## 2025-02-02 ENCOUNTER — Other Ambulatory Visit (HOSPITAL_BASED_OUTPATIENT_CLINIC_OR_DEPARTMENT_OTHER): Payer: Self-pay

## 2025-02-03 ENCOUNTER — Other Ambulatory Visit (HOSPITAL_BASED_OUTPATIENT_CLINIC_OR_DEPARTMENT_OTHER): Payer: Self-pay

## 2025-02-04 ENCOUNTER — Other Ambulatory Visit: Payer: Self-pay

## 2025-02-25 ENCOUNTER — Ambulatory Visit

## 2025-03-21 ENCOUNTER — Other Ambulatory Visit: Payer: Self-pay

## 2025-03-28 ENCOUNTER — Ambulatory Visit: Payer: Self-pay | Admitting: Internal Medicine

## 2025-04-22 ENCOUNTER — Ambulatory Visit: Admitting: Podiatry
